# Patient Record
Sex: Male | Born: 1970 | Race: Black or African American | Hispanic: No | Smoking: Current every day smoker
Health system: Southern US, Community
[De-identification: ages and names within clinical notes are randomized; demographics above are authoritative.]

## PROBLEM LIST (undated history)

## (undated) DIAGNOSIS — T7840XA Allergy, unspecified, initial encounter: Secondary | ICD-10-CM

## (undated) DIAGNOSIS — K219 Gastro-esophageal reflux disease without esophagitis: Secondary | ICD-10-CM

## (undated) DIAGNOSIS — F32A Depression, unspecified: Secondary | ICD-10-CM

## (undated) DIAGNOSIS — G4730 Sleep apnea, unspecified: Secondary | ICD-10-CM

## (undated) DIAGNOSIS — F329 Major depressive disorder, single episode, unspecified: Secondary | ICD-10-CM

## (undated) DIAGNOSIS — E119 Type 2 diabetes mellitus without complications: Secondary | ICD-10-CM

## (undated) DIAGNOSIS — I10 Essential (primary) hypertension: Secondary | ICD-10-CM

## (undated) DIAGNOSIS — F419 Anxiety disorder, unspecified: Secondary | ICD-10-CM

## (undated) DIAGNOSIS — M1990 Unspecified osteoarthritis, unspecified site: Secondary | ICD-10-CM

## (undated) DIAGNOSIS — F4322 Adjustment disorder with anxiety: Secondary | ICD-10-CM

## (undated) DIAGNOSIS — F17200 Nicotine dependence, unspecified, uncomplicated: Secondary | ICD-10-CM

## (undated) DIAGNOSIS — E785 Hyperlipidemia, unspecified: Secondary | ICD-10-CM

## (undated) HISTORY — PX: CARPAL TUNNEL RELEASE: SHX101

## (undated) HISTORY — DX: Hyperlipidemia, unspecified: E78.5

## (undated) HISTORY — DX: Adjustment disorder with anxiety: F43.22

## (undated) HISTORY — DX: Nicotine dependence, unspecified, uncomplicated: F17.200

## (undated) HISTORY — DX: Allergy, unspecified, initial encounter: T78.40XA

## (undated) HISTORY — PX: WISDOM TOOTH EXTRACTION: SHX21

---

## 2011-02-13 HISTORY — PX: OTHER SURGICAL HISTORY: SHX169

## 2012-07-09 ENCOUNTER — Encounter (HOSPITAL_COMMUNITY): Payer: Self-pay | Admitting: *Deleted

## 2012-07-09 ENCOUNTER — Emergency Department (HOSPITAL_COMMUNITY)
Admission: EM | Admit: 2012-07-09 | Discharge: 2012-07-09 | Disposition: A | Discharge status: Home / Self Care | Payer: Self-pay | Attending: Emergency Medicine | Admitting: Emergency Medicine

## 2012-07-09 DIAGNOSIS — K5289 Other specified noninfective gastroenteritis and colitis: Secondary | ICD-10-CM | POA: Insufficient documentation

## 2012-07-09 DIAGNOSIS — Z8711 Personal history of peptic ulcer disease: Secondary | ICD-10-CM | POA: Insufficient documentation

## 2012-07-09 DIAGNOSIS — Z880 Allergy status to penicillin: Secondary | ICD-10-CM | POA: Insufficient documentation

## 2012-07-09 DIAGNOSIS — Z79899 Other long term (current) drug therapy: Secondary | ICD-10-CM | POA: Insufficient documentation

## 2012-07-09 DIAGNOSIS — R112 Nausea with vomiting, unspecified: Secondary | ICD-10-CM | POA: Insufficient documentation

## 2012-07-09 DIAGNOSIS — I10 Essential (primary) hypertension: Secondary | ICD-10-CM | POA: Insufficient documentation

## 2012-07-09 DIAGNOSIS — K529 Noninfective gastroenteritis and colitis, unspecified: Secondary | ICD-10-CM

## 2012-07-09 HISTORY — DX: Essential (primary) hypertension: I10

## 2012-07-09 LAB — CBC WITH DIFFERENTIAL/PLATELET
Basophils Absolute: 0.1 10*3/uL (ref 0.0–0.1)
Basophils Relative: 0 % (ref 0–1)
Eosinophils Absolute: 0 10*3/uL (ref 0.0–0.7)
Eosinophils Relative: 0 % (ref 0–5)
HCT: 45.1 % (ref 39.0–52.0)
Hemoglobin: 15 g/dL (ref 13.0–17.0)
Lymphocytes Relative: 12 % (ref 12–46)
Lymphs Abs: 1.5 10*3/uL (ref 0.7–4.0)
MCH: 27.4 pg (ref 26.0–34.0)
MCHC: 33.3 g/dL (ref 30.0–36.0)
MCV: 82.4 fL (ref 78.0–100.0)
Monocytes Absolute: 0.6 10*3/uL (ref 0.1–1.0)
Monocytes Relative: 5 % (ref 3–12)
Neutro Abs: 10.7 10*3/uL — ABNORMAL HIGH (ref 1.7–7.7)
Neutrophils Relative %: 83 % — ABNORMAL HIGH (ref 43–77)
Platelets: 288 10*3/uL (ref 150–400)
RBC: 5.47 MIL/uL (ref 4.22–5.81)
RDW: 14.5 % (ref 11.5–15.5)
WBC: 12.9 10*3/uL — ABNORMAL HIGH (ref 4.0–10.5)

## 2012-07-09 LAB — COMPREHENSIVE METABOLIC PANEL
ALT: 16 U/L (ref 0–53)
AST: 17 U/L (ref 0–37)
Albumin: 4.4 g/dL (ref 3.5–5.2)
Alkaline Phosphatase: 101 U/L (ref 39–117)
BUN: 9 mg/dL (ref 6–23)
CO2: 21 mEq/L (ref 19–32)
Calcium: 9.9 mg/dL (ref 8.4–10.5)
Chloride: 106 mEq/L (ref 96–112)
Creatinine, Ser: 1.11 mg/dL (ref 0.50–1.35)
GFR calc Af Amer: >90 mL/min (ref >90)
GFR calc non Af Amer: 81 mL/min — ABNORMAL LOW (ref >90)
Glucose, Bld: 127 mg/dL — ABNORMAL HIGH (ref 70–99)
Potassium: 3.9 mEq/L (ref 3.5–5.1)
Sodium: 143 mEq/L (ref 135–145)
Total Bilirubin: 0.4 mg/dL (ref 0.3–1.2)
Total Protein: 8.1 g/dL (ref 6.0–8.3)

## 2012-07-09 LAB — URINALYSIS, ROUTINE W REFLEX MICROSCOPIC
Glucose, UA: NEGATIVE mg/dL
Hgb urine dipstick: NEGATIVE
Ketones, ur: 40 mg/dL — AB
Leukocytes, UA: NEGATIVE
Nitrite: NEGATIVE
Protein, ur: NEGATIVE mg/dL
Specific Gravity, Urine: 1.035 — ABNORMAL HIGH (ref 1.005–1.030)
Urobilinogen, UA: 0.2 mg/dL (ref 0.0–1.0)
pH: 5.5 (ref 5.0–8.0)

## 2012-07-09 LAB — LIPASE, BLOOD: Lipase: 27 U/L (ref 11–59)

## 2012-07-09 MED ORDER — PROMETHAZINE HCL 25 MG PO TABS: 25.0000 mg | ORAL_TABLET | Freq: Four times a day (QID) | ORAL | Status: DC | PRN

## 2012-07-09 MED ORDER — ONDANSETRON HCL 4 MG/2ML IJ SOLN
4.0000 mg | Freq: Once | INTRAMUSCULAR | Status: AC
  Administered 2012-07-09: 4 mg via INTRAVENOUS
  Filled 2012-07-09: qty 2

## 2012-07-09 MED ORDER — HYDROMORPHONE HCL PF 1 MG/ML IJ SOLN
1.0000 mg | Freq: Once | INTRAMUSCULAR | Status: AC
  Administered 2012-07-09: 1 mg via INTRAVENOUS
  Filled 2012-07-09: qty 1

## 2012-07-09 MED ORDER — PROMETHAZINE HCL 25 MG/ML IJ SOLN
12.5000 mg | Freq: Once | INTRAMUSCULAR | Status: AC
  Administered 2012-07-09: 12.5 mg via INTRAVENOUS
  Filled 2012-07-09: qty 1

## 2012-07-09 MED ORDER — SODIUM CHLORIDE 0.9 % IV BOLUS (SEPSIS)
2000.0000 mL | Freq: Once | INTRAVENOUS | Status: AC
  Administered 2012-07-09: 2000 mL via INTRAVENOUS

## 2012-07-09 NOTE — ED Notes (Signed)
Pt resting now.  nadn.

## 2012-07-09 NOTE — ED Provider Notes (Signed)
History     CSN: [id number]  Arrival date & time 07/09/12  1751   First MD Initiated Contact with Patient 07/09/12 1815      Chief Complaint  Patient presents with  . Emesis  . Diarrhea    (Consider location/radiation/quality/duration/timing/severity/associated sxs/prior treatment) HPI Patient presents to the emergency department with nausea, vomiting, and diarrhea that began 2 days, ago.  Patient, states, that he's had this in the past and has seen GI at [facility] patient denies chest pain, shortness of breath, back pain, dysuria, fever, or weakness, syncope, or dizziness.  The patient, states, that he did not take anything prior to arrival for his symptoms.  Patient, states, that nothing seems to make his condition, better.  He states, that drinking fluids, makes him vomit more. Past Medical History  Diagnosis Date  . Hypertension   . Gastric ulcer     History reviewed. No pertinent past surgical history.  History reviewed. No pertinent family history.  History  Substance Use Topics  . Smoking status: Not on file  . Smokeless tobacco: Not on file  . Alcohol Use: No      Review of Systems All other systems negative except as documented in the HPI. All pertinent positives and negatives as reviewed in the HPI. Allergies  Penicillins  Home Medications   Current Outpatient Rx  Name  Route  Sig  Dispense  Refill  . omeprazole (PRILOSEC) 20 MG capsule   Oral   Take 20 mg by mouth daily.           BP 125/76  Pulse 58  Temp(Src) 98.8 F (37.1 C) (Oral)  Resp 16  SpO2 97%  Physical Exam  Nursing note and vitals reviewed. Constitutional: He is oriented to person, place, and time. He appears well-developed and well-nourished. No distress.  HENT:  Head: Normocephalic and atraumatic.  Mouth/Throat: Oropharynx is clear and moist.  Eyes: Pupils are equal, round, and reactive to light.  Neck: Normal range of motion. Neck supple.  Cardiovascular: Normal rate,  regular rhythm and normal heart sounds.  Exam reveals no gallop and no friction rub.   No murmur heard. Pulmonary/Chest: Effort normal and breath sounds normal. No respiratory distress.  Abdominal: Soft. Bowel sounds are normal. He exhibits no distension. There is no rebound and no guarding.  Neurological: He is alert and oriented to person, place, and time.  Skin: Skin is warm and dry.    ED Course  Procedures (including critical care time)  Labs Reviewed  CBC WITH DIFFERENTIAL - Abnormal; Notable for the following:    WBC 12.9 (*)    Neutrophils Relative % 83 (*)    Neutro Abs 10.7 (*)    All other components within normal limits  COMPREHENSIVE METABOLIC PANEL - Abnormal; Notable for the following:    Glucose, Bld 127 (*)    GFR calc non Af Amer 81 (*)    All other components within normal limits  URINALYSIS, ROUTINE W REFLEX MICROSCOPIC - Abnormal; Notable for the following:    Specific Gravity, Urine 1.035 (*)    Bilirubin Urine SMALL (*)    Ketones, ur 40 (*)    All other components within normal limits  LIPASE, BLOOD  patient states, that he needs to go home since he has to take the bus. The patient is feeling better after fluids and pain medication.  Patient is advised to followup with his GI doctor is told to return here for any worsening in his condition.  MDM         Carlyle Dolly, PA-C 07/09/12 2152

## 2012-07-09 NOTE — ED Notes (Signed)
Pt arrived by [facility] for diarrhea since yesterday morning and vomiting since this am. Hx of gi ulcer. Given zofran pta.

## 2012-07-09 NOTE — ED Notes (Signed)
Pt dc to home. Pt sts understanding to dc instructions. Pt ambulatory to exit without difficulty.  Pt denies need for w/c.  

## 2012-07-09 NOTE — ED Provider Notes (Signed)
Medical screening examination/treatment/procedure(s) were performed by non-physician practitioner and as supervising physician I was immediately available for consultation/collaboration.  Jerred Zaremba T Folasade Mooty, MD 07/09/12 2328 

## 2015-04-21 ENCOUNTER — Encounter: Payer: Self-pay | Admitting: Internal Medicine

## 2015-04-21 ENCOUNTER — Ambulatory Visit (INDEPENDENT_AMBULATORY_CARE_PROVIDER_SITE_OTHER): Payer: Self-pay | Admitting: Internal Medicine

## 2015-04-21 VITALS — BP 127/78 | HR 76 | Temp 98.6°F | Ht 66.9 in | Wt 206.4 lb

## 2015-04-21 DIAGNOSIS — R5381 Other malaise: Secondary | ICD-10-CM

## 2015-04-21 DIAGNOSIS — K219 Gastro-esophageal reflux disease without esophagitis: Secondary | ICD-10-CM

## 2015-04-21 DIAGNOSIS — F4322 Adjustment disorder with anxiety: Secondary | ICD-10-CM

## 2015-04-21 DIAGNOSIS — Z789 Other specified health status: Secondary | ICD-10-CM

## 2015-04-21 DIAGNOSIS — Z0000 Encounter for general adult medical examination without abnormal findings: Secondary | ICD-10-CM

## 2015-04-21 DIAGNOSIS — F4323 Adjustment disorder with mixed anxiety and depressed mood: Secondary | ICD-10-CM

## 2015-04-21 DIAGNOSIS — F17200 Nicotine dependence, unspecified, uncomplicated: Secondary | ICD-10-CM

## 2015-04-21 MED ORDER — NICOTINE 14 MG/24HR TD PT24: 14.0000 mg | MEDICATED_PATCH | TRANSDERMAL | Status: DC

## 2015-04-21 MED ORDER — NICOTINE POLACRILEX 4 MG MT GUM: 4.0000 mg | CHEWING_GUM | OROMUCOSAL | Status: DC | PRN

## 2015-04-21 NOTE — Assessment & Plan Note (Addendum)
He is smoking half pack daily for the last 30 years, and is ready to quit. I offered him the [place] quit line number, and asked him to call for free samples of nicotine patch and gum. At the next visit, once he gets insurance, we can offer varenicline. I would steer clear of bupropion as it can worsen his pre-existing anxiety.

## 2015-04-21 NOTE — Assessment & Plan Note (Signed)
Once he is large card, we can order a lipid panel and consider HIV screen, although he does not have any risk factors.

## 2015-04-21 NOTE — Assessment & Plan Note (Addendum)
He complains of persistently worrying about things, which I believe is most likely adjustment disorder with anxious mood given he has had about 10 death in his family over the last year, the most recent being his mother in December. He had tried Zoloft in the past, but this actually worsened his anxiety. At the next visit, once he gets insurance, we can offer a different SSRI.

## 2015-04-21 NOTE — Patient Instructions (Signed)
Timothy Mccarthy,  It was great to meet you today and I'm glad to hear you'll be one of our patients going forward.  Like we discussed, I highly doubt your chest pain was a heart attack, so you can rest assured.  Also, quitting smoking is HANDS DOWN the best thing you can do for your health. Give the [place]-Quitline a call and ask for some free samples for nicotine patch and gum.  Once you get the insurance figured out, we can get some lab work but nothing is pressing right now.  Take care and we'll see you in 1 month, Dr. Melburn Hake

## 2015-04-21 NOTE — Progress Notes (Signed)
Patient ID: Timothy Mccarthy, male   DOB: 1970/11/14, 45 y.o.   MRN: [id number] [facility] Subjective:   Patient ID: Timothy Mccarthy male   DOB: [birth year] 45 y.o.   MRN: [id number]  HPI: TimothyLevander SHEIKH Mccarthy is a 45 y.o. male with tobacco use disorder presenting to clinic to establish care and complains of an episode of epigastric pain, anxiety, and tobacco abuse.  Epigastric pain: Last week, he laid down to go to bed shortly after eating and had an episode of sharp epigastric pain. He denies any exertional component, nausea, or diaphoresis. He further denies any exertional chest pain and can comfortably walk up 10 flights of stairs without any pain.  Anxiety: Over the last year, 61 of his family members have died. He feels himself worrying constantly and has trouble sleeping. He had been on Zoloft in the past but it made him feel even more anxious. He'd like to talk to a counselor once he gets insurance.  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  Respiratory: Negative for shortness of breath.   Cardiovascular: Negative for chest pain, palpitations, orthopnea, claudication and leg swelling.  Gastrointestinal: Positive for heartburn and abdominal pain. Negative for diarrhea, constipation, blood in stool and melena.  Neurological: Negative for dizziness and loss of consciousness.  Psychiatric/Behavioral: Negative for depression and suicidal ideas. The patient is nervous/anxious.      Objective:  Physical Exam: Filed Vitals:   04/21/15 1512  BP: 127/78  Pulse: 76  Temp: 98.6 F (37 C)  TempSrc: Oral  Height: 5' 6.9" (1.699 m)  Weight: 206 lb 6.4 oz (93.622 kg)  SpO2: 100%   General: friendly young man resting in chair comfortably, appropriately conversational Cardiac: regular rate and rhythm, no rubs, murmurs or gallops Pulm: breathing well, clear to auscultation bilaterally Abd: bowel sounds normal, soft, nondistended, non-tender Ext: warm and well perfused, without pedal edema Lymph: no cervical or supraclavicular lymphadenopathy  Assessment & Plan:  Case discussed with Dr. Dareen Piano  GERD (gastroesophageal reflux disease) He has mild intermittent gastroesophageal reflux disease without alarm symptoms. I didn't feel strongly about prescribing pantoprazole today; instead I recommended he take Tums as needed and cut back on caffeine.  Tobacco use disorder He is smoking half pack daily for the last 30 years, and is ready to quit. I offered him the [place] quit line number, and asked him to call for free samples of nicotine patch and gum. At the next visit, once he gets insurance, we can offer varenicline. I would steer clear of bupropion as it can worsen his pre-existing anxiety.  Health care maintenance Once he is large card, we can order a lipid panel and consider HIV screen, although he does not have any risk factors.  Adjustment disorder with anxious mood He complains of persistently worrying about things, which I believe is most likely adjustment disorder with anxious mood given he has had about 10 death in his family over the last year, the most recent being his  mother in December. He had tried Zoloft in the past, but this actually worsened his anxiety. At the next visit, once he gets insurance, we can offer a different SSRI.   Medications Ordered Meds ordered this encounter  Medications  . nicotine (NICODERM CQ - DOSED IN MG/24 HOURS) 14 mg/24hr patch     Sig: Place 1 patch (14 mg total) onto the skin daily.    Dispense:  30 patch    Refill:  0  . nicotine polacrilex (NICORETTE) 4 MG gum    Sig: Take 1 each (4 mg total) by mouth as needed for smoking cessation.    Dispense:  100 tablet    Refill:  0   Follow Up: Return in about 4 weeks (around 05/19/2015).

## 2015-04-21 NOTE — Assessment & Plan Note (Signed)
He has mild intermittent gastroesophageal reflux disease without alarm symptoms. I didn't feel strongly about prescribing pantoprazole today; instead I recommended he take Tums as needed and cut back on caffeine.

## 2015-04-22 NOTE — Progress Notes (Signed)
Internal Medicine Clinic Attending  Case discussed with Dr. Flores at the time of the visit.  We reviewed the resident's history and exam and pertinent patient test results.  I agree with the assessment, diagnosis, and plan of care documented in the resident's note. 

## 2015-05-02 ENCOUNTER — Ambulatory Visit: Payer: Self-pay

## 2015-09-14 ENCOUNTER — Observation Stay (HOSPITAL_COMMUNITY)
Admission: EM | Admit: 2015-09-14 | Discharge: 2015-09-16 | Disposition: A | Discharge status: Home / Self Care | Payer: BLUE CROSS/BLUE SHIELD | Attending: General Surgery | Admitting: General Surgery

## 2015-09-14 ENCOUNTER — Encounter (HOSPITAL_COMMUNITY): Payer: Self-pay | Admitting: *Deleted

## 2015-09-14 ENCOUNTER — Encounter (HOSPITAL_COMMUNITY): Payer: Self-pay | Admitting: Emergency Medicine

## 2015-09-14 ENCOUNTER — Ambulatory Visit (INDEPENDENT_AMBULATORY_CARE_PROVIDER_SITE_OTHER)
Admission: EM | Admit: 2015-09-14 | Discharge: 2015-09-14 | Disposition: A | Discharge status: Nursing Home (Includes ICF) | Payer: BLUE CROSS/BLUE SHIELD | Source: Home / Self Care | Attending: Family Medicine | Admitting: Family Medicine

## 2015-09-14 DIAGNOSIS — Z880 Allergy status to penicillin: Secondary | ICD-10-CM | POA: Diagnosis not present

## 2015-09-14 DIAGNOSIS — K219 Gastro-esophageal reflux disease without esophagitis: Secondary | ICD-10-CM | POA: Insufficient documentation

## 2015-09-14 DIAGNOSIS — K3580 Unspecified acute appendicitis: Secondary | ICD-10-CM | POA: Diagnosis present

## 2015-09-14 DIAGNOSIS — R1011 Right upper quadrant pain: Secondary | ICD-10-CM

## 2015-09-14 DIAGNOSIS — I10 Essential (primary) hypertension: Secondary | ICD-10-CM | POA: Diagnosis not present

## 2015-09-14 DIAGNOSIS — K37 Unspecified appendicitis: Secondary | ICD-10-CM | POA: Diagnosis present

## 2015-09-14 DIAGNOSIS — F17210 Nicotine dependence, cigarettes, uncomplicated: Secondary | ICD-10-CM | POA: Insufficient documentation

## 2015-09-14 DIAGNOSIS — K353 Acute appendicitis with localized peritonitis, without perforation or gangrene: Secondary | ICD-10-CM

## 2015-09-14 DIAGNOSIS — K3589 Other acute appendicitis without perforation or gangrene: Secondary | ICD-10-CM

## 2015-09-14 LAB — COMPREHENSIVE METABOLIC PANEL
ALT: 35 U/L (ref 17–63)
AST: 27 U/L (ref 15–41)
Albumin: 4.1 g/dL (ref 3.5–5.0)
Alkaline Phosphatase: 102 U/L (ref 38–126)
Anion gap: 9 (ref 5–15)
BUN: 10 mg/dL (ref 6–20)
CO2: 28 mmol/L (ref 22–32)
Calcium: 9.5 mg/dL (ref 8.9–10.3)
Chloride: 101 mmol/L (ref 101–111)
Creatinine, Ser: 1.26 mg/dL — ABNORMAL HIGH (ref 0.61–1.24)
GFR calc Af Amer: >60 mL/min (ref >60)
GFR calc non Af Amer: >60 mL/min (ref >60)
Glucose, Bld: 104 mg/dL — ABNORMAL HIGH (ref 65–99)
Potassium: 3.2 mmol/L — ABNORMAL LOW (ref 3.5–5.1)
Sodium: 138 mmol/L (ref 135–145)
Total Bilirubin: 0.6 mg/dL (ref 0.3–1.2)
Total Protein: 7.4 g/dL (ref 6.5–8.1)

## 2015-09-14 LAB — CBC
HCT: 44.5 % (ref 39.0–52.0)
Hemoglobin: 14.5 g/dL (ref 13.0–17.0)
MCH: 27.8 pg (ref 26.0–34.0)
MCHC: 32.6 g/dL (ref 30.0–36.0)
MCV: 85.2 fL (ref 78.0–100.0)
Platelets: 264 10*3/uL (ref 150–400)
RBC: 5.22 MIL/uL (ref 4.22–5.81)
RDW: 14.1 % (ref 11.5–15.5)
WBC: 14.2 10*3/uL — ABNORMAL HIGH (ref 4.0–10.5)

## 2015-09-14 LAB — URINALYSIS, ROUTINE W REFLEX MICROSCOPIC
Bilirubin Urine: NEGATIVE
Glucose, UA: NEGATIVE mg/dL
Hgb urine dipstick: NEGATIVE
Ketones, ur: NEGATIVE mg/dL
Nitrite: NEGATIVE
Protein, ur: NEGATIVE mg/dL
Specific Gravity, Urine: 1.031 — ABNORMAL HIGH (ref 1.005–1.030)
pH: 7 (ref 5.0–8.0)

## 2015-09-14 LAB — URINE MICROSCOPIC-ADD ON: RBC / HPF: NONE SEEN RBC/hpf (ref 0–5)

## 2015-09-14 LAB — LIPASE, BLOOD: Lipase: 19 U/L (ref 11–51)

## 2015-09-14 MED ORDER — MORPHINE SULFATE (PF) 4 MG/ML IV SOLN
4.0000 mg | Freq: Once | INTRAVENOUS | Status: AC
  Administered 2015-09-15: 4 mg via INTRAVENOUS
  Filled 2015-09-14: qty 1

## 2015-09-14 MED ORDER — ONDANSETRON HCL 4 MG/2ML IJ SOLN
4.0000 mg | Freq: Once | INTRAMUSCULAR | Status: AC
  Administered 2015-09-15: 4 mg via INTRAVENOUS
  Filled 2015-09-14: qty 2

## 2015-09-14 MED ORDER — SODIUM CHLORIDE 0.9 % IV BOLUS (SEPSIS)
1000.0000 mL | Freq: Once | INTRAVENOUS | Status: AC
  Administered 2015-09-15: 1000 mL via INTRAVENOUS

## 2015-09-14 NOTE — ED Notes (Signed)
Cassie, RN in nurse first given report

## 2015-09-14 NOTE — ED Triage Notes (Signed)
Pt c/o abdominal pain for the past couple of days. Pt vomiting once today after eating lunch, last BM was last night. Pt denies any urinary symptoms.

## 2015-09-14 NOTE — ED Notes (Signed)
PT chooses to go to ED via private vehicle

## 2015-09-14 NOTE — ED Provider Notes (Signed)
[place] DEPT Provider Note   CSN: [id number] Arrival date & time: 09/14/15  2013  First Provider Contact:  None       History   Chief Complaint Chief Complaint  Patient presents with  . Abdominal Pain    HPI Timothy Mccarthy is a 45 y.o. male.  45 yo M with a chief complaint of epigastric abdominal pain. Going on for the past couple days. Usually worse with belching and passing flatus. Has been constipated for the same number of days. Denies prior abdominal surgery. Denies fevers or chills. Patient has had difficulty eating for the past day. Just doesn't feel like it did when he eats he has worsening of his pain. He feels like the symptoms are somewhat similar when he had reflux in the past.   The history is provided by the patient.  Abdominal Pain   This is a new problem. The current episode started 2 days ago. The problem occurs constantly. The problem has been gradually worsening. The pain is associated with eating. The pain is located in the epigastric region. The quality of the pain is shooting, sharp and burning. The pain is at a severity of 7/10. The pain is moderate. Associated symptoms include nausea and constipation. Pertinent negatives include fever, diarrhea, vomiting, headaches, arthralgias and myalgias. Nothing aggravates the symptoms. The symptoms are relieved by belching, sitting up and flatus.    Past Medical History:  Diagnosis Date  . Adjustment disorder with anxious mood   . Hypertension   . Tobacco use disorder     Patient Active Problem List   Diagnosis Date Noted  . Appendicitis 09/15/2015  . Tobacco use disorder 04/21/2015  . Adjustment disorder with anxious mood 04/21/2015  . Health care maintenance 04/21/2015  . GERD (gastroesophageal reflux disease) 04/21/2015    Past Surgical History:  Procedure Laterality Date  . LUMPS ON BACK  2013  . WISDOM TOOTH EXTRACTION         Home Medications    Prior to Admission medications     Medication Sig Start Date End Date Taking? Authorizing Provider  hydrochlorothiazide (HYDRODIURIL) 25 MG tablet Take 25 mg by mouth daily.   Yes Historical Provider, MD  mirtazapine (REMERON) 15 MG tablet Take 15 mg by mouth at bedtime.   Yes Historical Provider, MD  nicotine (NICODERM CQ - DOSED IN MG/24 HOURS) 14 mg/24hr patch Place 1 patch (14 mg total) onto the skin daily. Patient not taking: Reported on 09/14/2015 04/21/15   Loleta Chance, MD  nicotine polacrilex (NICORETTE) 4 MG gum Take 1 each (4 mg total) by mouth as needed for smoking cessation. Patient not taking: Reported on 09/14/2015 04/21/15   Loleta Chance, MD    Family History Family History  Problem Relation Age of Onset  . Stroke Mother   . Diabetes type II Mother     Social History Social History  Substance Use Topics  . Smoking status: Current Every Day Smoker    Packs/day: 0.50    Years: 10.00    Types: Cigarettes    Start date: 04/21/1987  . Smokeless tobacco: Never Used  . Alcohol use 0.0 oz/week     Comment: sometimes     Allergies   Penicillins   Review of Systems Review of Systems  Constitutional: Negative for chills and fever.  HENT: Negative for congestion and facial swelling.   Eyes: Negative for discharge and visual disturbance.  Respiratory: Negative for shortness of breath.   Cardiovascular: Negative for chest pain  and palpitations.  Gastrointestinal: Positive for abdominal pain, constipation and nausea. Negative for diarrhea and vomiting.  Musculoskeletal: Negative for arthralgias and myalgias.  Skin: Negative for color change and rash.  Neurological: Negative for tremors, syncope and headaches.  Psychiatric/Behavioral: Negative for confusion and dysphoric mood.     Physical Exam Updated Vital Signs BP 113/79 (BP Location: Left Arm)   Pulse 84   Temp 98.2 F (36.8 C) (Oral)   Resp 19   Ht 5\' 7"  (1.702 m)   Wt 240 lb 1.3 oz (108.9 kg)   SpO2 96%   BMI 37.60 kg/m   Physical Exam   Constitutional: He is oriented to person, place, and time. He appears well-developed and well-nourished.  HENT:  Head: Normocephalic and atraumatic.  Eyes: Conjunctivae and EOM are normal. Pupils are equal, round, and reactive to light.  Neck: Normal range of motion. No JVD present.  Cardiovascular: Normal rate and regular rhythm.   Pulmonary/Chest: Effort normal. No stridor. No respiratory distress.  Abdominal: He exhibits no distension. There is tenderness. There is rebound and guarding.  Tenderness worse at McBurney's point. Positive Ross takes positive psoas negative obturator  Musculoskeletal: Normal range of motion. He exhibits no edema.  Neurological: He is alert and oriented to person, place, and time.  Skin: Skin is warm and dry.  Psychiatric: He has a normal mood and affect. His behavior is normal.     ED Treatments / Results  Labs (all labs ordered are listed, but only abnormal results are displayed) Labs Reviewed  SURGICAL PCR SCREEN - Abnormal; Notable for the following:       Result Value   Staphylococcus aureus POSITIVE (*)    All other components within normal limits  COMPREHENSIVE METABOLIC PANEL - Abnormal; Notable for the following:    Potassium 3.2 (*)    Glucose, Bld 104 (*)    Creatinine, Ser 1.26 (*)    All other components within normal limits  CBC - Abnormal; Notable for the following:    WBC 14.2 (*)    All other components within normal limits  URINALYSIS, ROUTINE W REFLEX MICROSCOPIC (NOT AT [facility]) - Abnormal; Notable for the following:    Specific Gravity, Urine 1.031 (*)    Leukocytes, UA SMALL (*)    All other components within normal limits  URINE MICROSCOPIC-ADD ON - Abnormal; Notable for the following:    Squamous Epithelial / LPF 0-5 (*)    Bacteria, UA RARE (*)    All other components within normal limits  LIPASE, BLOOD    EKG  EKG Interpretation None       Radiology Ct Abdomen Pelvis W Contrast  Result Date: 09/15/2015 CLINICAL  DATA:  45 year old male with right lower quadrant abdominal pain. Concern for appendicitis. EXAM: CT ABDOMEN AND PELVIS WITH CONTRAST TECHNIQUE: Multidetector CT imaging of the abdomen and pelvis was performed using the standard protocol following bolus administration of intravenous contrast. CONTRAST:  152mL ISOVUE-300 IOPAMIDOL (ISOVUE-300) INJECTION 61% COMPARISON:  None. FINDINGS: The visualized lung bases are clear. No intra-abdominal free air or free fluid. The liver, gallbladder, pancreas, spleen, adrenal glands, kidneys, visualized ureters, and urinary bladder appear unremarkable. The prostate and seminal vesicles are grossly unremarkable. High attenuating content within the stomach likely related to ingested material and less likely blood products. There is moderate stool throughout the colon. Small scattered colonic diverticula without active inflammatory changes. No evidence of bowel obstruction. The appendix is enlarged and inflamed. With multiple large stones within the appendix. An  obstructing stone is noted at the base of the appendix. The appendix is located in the right lower quadrant inferior to the cecum. There is no evidence of perforation or abscess. There is minimal aortoiliac atherosclerotic disease. No portal venous gas identified. There is no adenopathy. Small fat containing umbilical hernia. The abdominal wall soft tissues are otherwise unremarkable. Degenerative changes of the lumbar spine. The osseous structures are intact. IMPRESSION: Acute appendicitis.  No abscess. Electronically Signed   By: Anner Crete M.D.   On: 09/15/2015 01:48    Procedures Procedures (including critical care time)  Medications Ordered in ED Medications  enoxaparin (LOVENOX) injection 40 mg (not administered)  ciprofloxacin (CIPRO) IVPB 400 mg (400 mg Intravenous Given 09/15/15 1023)    And  metroNIDAZOLE (FLAGYL) IVPB 500 mg (500 mg Intravenous Given 09/15/15 1023)  acetaminophen (TYLENOL) tablet 650  mg (not administered)    Or  acetaminophen (TYLENOL) suppository 650 mg (not administered)  morphine 2 MG/ML injection 2 mg (not administered)  methocarbamol (ROBAXIN) tablet 500 mg (not administered)  ondansetron (ZOFRAN-ODT) disintegrating tablet 4 mg (not administered)    Or  ondansetron (ZOFRAN) injection 4 mg (not administered)  nicotine (NICODERM CQ - dosed in mg/24 hours) patch 14 mg (14 mg Transdermal Patch Applied 09/15/15 1029)  morphine 4 MG/ML injection 4 mg (4 mg Intravenous Given 09/15/15 0009)  ondansetron (ZOFRAN) injection 4 mg (4 mg Intravenous Given 09/15/15 0007)  sodium chloride 0.9 % bolus 1,000 mL (0 mLs Intravenous Stopped 09/15/15 0227)  iopamidol (ISOVUE-300) 61 % injection (100 mLs  Contrast Given 09/15/15 0100)  ciprofloxacin (CIPRO) IVPB 400 mg (0 mg Intravenous Stopped 09/15/15 0333)    And  metroNIDAZOLE (FLAGYL) IVPB 500 mg (0 mg Intravenous Stopped 09/15/15 0333)  morphine 4 MG/ML injection 4 mg (4 mg Intravenous Given 09/15/15 0224)  0.9 %  sodium chloride infusion ( Intravenous New Bag/Given 09/15/15 0228)     Initial Impression / Assessment and Plan / ED Course  I have reviewed the triage vital signs and the nursing notes.  Pertinent labs & imaging results that were available during my care of the patient were reviewed by me and considered in my medical decision making (see chart for details).  Clinical Course    45 yo M With a chief complaint of abdominal pain. My abdominal exam is concerning for appendicitis as the patient has severe right lower quadrant tenderness. Will obtain a CT scan of the abdomen and pelvis contrast. Turned over to Dr. Stark Jock, awaiting CT  The patients results and plan were reviewed and discussed.   Any x-rays performed were independently reviewed by myself.   Differential diagnosis were considered with the presenting HPI.  Medications  enoxaparin (LOVENOX) injection 40 mg (not administered)  ciprofloxacin (CIPRO) IVPB 400 mg (400 mg  Intravenous Given 09/15/15 1023)    And  metroNIDAZOLE (FLAGYL) IVPB 500 mg (500 mg Intravenous Given 09/15/15 1023)  acetaminophen (TYLENOL) tablet 650 mg (not administered)    Or  acetaminophen (TYLENOL) suppository 650 mg (not administered)  morphine 2 MG/ML injection 2 mg (not administered)  methocarbamol (ROBAXIN) tablet 500 mg (not administered)  ondansetron (ZOFRAN-ODT) disintegrating tablet 4 mg (not administered)    Or  ondansetron (ZOFRAN) injection 4 mg (not administered)  nicotine (NICODERM CQ - dosed in mg/24 hours) patch 14 mg (14 mg Transdermal Patch Applied 09/15/15 1029)  morphine 4 MG/ML injection 4 mg (4 mg Intravenous Given 09/15/15 0009)  ondansetron (ZOFRAN) injection 4 mg (4 mg Intravenous Given 09/15/15  0007)  sodium chloride 0.9 % bolus 1,000 mL (0 mLs Intravenous Stopped 09/15/15 0227)  iopamidol (ISOVUE-300) 61 % injection (100 mLs  Contrast Given 09/15/15 0100)  ciprofloxacin (CIPRO) IVPB 400 mg (0 mg Intravenous Stopped 09/15/15 0333)    And  metroNIDAZOLE (FLAGYL) IVPB 500 mg (0 mg Intravenous Stopped 09/15/15 0333)  morphine 4 MG/ML injection 4 mg (4 mg Intravenous Given 09/15/15 0224)  0.9 %  sodium chloride infusion ( Intravenous New Bag/Given 09/15/15 0228)    Vitals:   09/15/15 0444 09/15/15 0517 09/15/15 0952 09/15/15 1349  BP: 142/92 118/77 105/73 113/79  Pulse: 67 62 65 84  Resp: 19 20 19 19   Temp: 97.7 F (36.5 C) 98 F (36.7 C) 97.6 F (36.4 C) 98.2 F (36.8 C)  TempSrc: Oral Oral Oral Oral  SpO2: 95% 95% 98% 96%  Weight:  240 lb 1.3 oz (108.9 kg)    Height:  5\' 7"  (1.702 m)      Final diagnoses:  Acute appendicitis with localized peritonitis       Final Clinical Impressions(s) / ED Diagnoses   Final diagnoses:  Acute appendicitis with localized peritonitis    New Prescriptions Current Discharge Medication List       Deno Etienne, DO 09/15/15 1442

## 2015-09-14 NOTE — ED Triage Notes (Signed)
PT reports epigastric pain that started Monday. Pt reports his eating habits have been bad lately. PT reports pain is worse after eating and lying down. PT reports when he burps, it burns. PT used to be on prilosec for GERD

## 2015-09-14 NOTE — ED Provider Notes (Signed)
Angiocath insertion Date/Time: 09/14/2015 11:50 PM Performed by: Abigail Butts Authorized by: Abigail Butts  Consent: Verbal consent obtained. Risks and benefits: risks, benefits and alternatives were discussed Consent given by: patient Patient understanding: patient states understanding of the procedure being performed Patient consent: the patient's understanding of the procedure matches consent given Procedure consent: procedure consent matches procedure scheduled Site marked: the operative site was marked Required items: required blood products, implants, devices, and special equipment available Patient identity confirmed: verbally with patient Time out: Immediately prior to procedure a "time out" was called to verify the correct patient, procedure, equipment, support staff and site/side marked as required. Preparation: Patient was prepped and draped in the usual sterile fashion. Local anesthesia used: no  Anesthesia: Local anesthesia used: no  Sedation: Patient sedated: no Patient tolerance: Patient tolerated the procedure well with no immediate complications Comments: [id number] Right AC      Aren Cherne, PA-C 09/14/15 [place], DO 09/14/15 2352

## 2015-09-14 NOTE — ED Provider Notes (Signed)
CSN: [id number]     Arrival date & time 09/14/15  1826 History   First MD Initiated Contact with Patient 09/14/15 1936     Chief Complaint  Patient presents with  . Gastroesophageal Reflux   (Consider location/radiation/quality/duration/timing/severity/associated sxs/prior Treatment) HPI 45 Y/O MALE WITH 2 DAY HX OF ABDOMINAL PAIN AND BLOATING. VOMITING WITH EATING. PAIN SCORE 5. USING OTC MEDS WITHOUT RELIEF. HX OF GERD, BUT NOT ON MEDS. DRANK ETOH Monday, A BIT MORE THAN USUAL.  Past Medical History:  Diagnosis Date  . Adjustment disorder with anxious mood   . Hypertension   . Tobacco use disorder    History reviewed. No pertinent surgical history. Family History  Problem Relation Age of Onset  . Stroke Mother   . Diabetes type II Mother    Social History  Substance Use Topics  . Smoking status: Current Every Day Smoker    Packs/day: 0.50    Types: Cigarettes    Start date: 04/21/1987  . Smokeless tobacco: Never Used  . Alcohol use 0.0 oz/week     Comment: sometimes    Review of Systems  Denies: HEADACHE,   CHEST PAIN, CONGESTION, DYSURIA, SHORTNESS OF BREATH  Allergies  Penicillins  Home Medications   Prior to Admission medications   Medication Sig Start Date End Date Taking? Authorizing Provider  nicotine (NICODERM CQ - DOSED IN MG/24 HOURS) 14 mg/24hr patch Place 1 patch (14 mg total) onto the skin daily. 04/21/15   Loleta Chance, MD  nicotine polacrilex (NICORETTE) 4 MG gum Take 1 each (4 mg total) by mouth as needed for smoking cessation. 04/21/15   Loleta Chance, MD   Meds Ordered and Administered this Visit  Medications - No data to display  BP (!) 150/102 (BP Location: Left Arm)   Pulse 84   Temp 98.7 F (37.1 C) (Oral)   Resp 16   SpO2 97%  No data found.   Physical Exam NURSES NOTES AND VITAL SIGNS REVIEWED. CONSTITUTIONAL: Well developed, well nourished, no acute distress HEENT: normocephalic, atraumatic EYES: Conjunctiva normal NECK:normal ROM,  supple, no adenopathy PULMONARY:No respiratory distress, normal effort ABDOMINAL: Soft,DISTENDED, +'VE MURPHY BS DECREASED, No CVAT MUSCULOSKELETAL: Normal ROM of all extremities,  SKIN: warm and dry without rash PSYCHIATRIC: Mood and affect, behavior are normal  Urgent Care Course   Clinical Course    Procedures (including critical care time)  Labs Review Labs Reviewed - No data to display  Imaging Review No results found.   Visual Acuity Review  Right Eye Distance:   Left Eye Distance:   Bilateral Distance:    Right Eye Near:   Left Eye Near:    Bilateral Near:         MDM   1. RUQ pain    PT HAS POSITIVE MURPHY, NO FEVER, NOT SEPTIC WITH STABLE VITALS.  SUGGEST ER FOR FURTHER EVALUATION, POSSIBLE GALLBLADDER, GASTRITIS, PANCREATITIS.     Konrad Felix, PA 09/14/15 1956

## 2015-09-15 ENCOUNTER — Encounter (HOSPITAL_COMMUNITY)
Admission: EM | Disposition: A | Discharge status: Home / Self Care | Payer: Self-pay | Source: Home / Self Care | Attending: Emergency Medicine

## 2015-09-15 ENCOUNTER — Inpatient Hospital Stay (HOSPITAL_COMMUNITY): Payer: BLUE CROSS/BLUE SHIELD | Admitting: Certified Registered Nurse Anesthetist

## 2015-09-15 ENCOUNTER — Encounter (HOSPITAL_COMMUNITY): Payer: Self-pay | Admitting: Radiology

## 2015-09-15 ENCOUNTER — Emergency Department (HOSPITAL_COMMUNITY): Payer: BLUE CROSS/BLUE SHIELD

## 2015-09-15 DIAGNOSIS — K37 Unspecified appendicitis: Secondary | ICD-10-CM | POA: Diagnosis present

## 2015-09-15 DIAGNOSIS — I10 Essential (primary) hypertension: Secondary | ICD-10-CM | POA: Diagnosis not present

## 2015-09-15 DIAGNOSIS — K3580 Unspecified acute appendicitis: Secondary | ICD-10-CM | POA: Diagnosis not present

## 2015-09-15 DIAGNOSIS — Z880 Allergy status to penicillin: Secondary | ICD-10-CM | POA: Diagnosis not present

## 2015-09-15 DIAGNOSIS — F17210 Nicotine dependence, cigarettes, uncomplicated: Secondary | ICD-10-CM | POA: Diagnosis not present

## 2015-09-15 HISTORY — PX: APPENDECTOMY: SHX54

## 2015-09-15 HISTORY — PX: LAPAROSCOPIC APPENDECTOMY: SHX408

## 2015-09-15 LAB — SURGICAL PCR SCREEN
MRSA, PCR: NEGATIVE
Staphylococcus aureus: POSITIVE — AB

## 2015-09-15 SURGERY — APPENDECTOMY, LAPAROSCOPIC
Anesthesia: General | Site: Abdomen

## 2015-09-15 MED ORDER — OXYCODONE HCL 5 MG PO TABS: 5.0000 mg | ORAL_TABLET | Freq: Once | ORAL | Status: DC | PRN

## 2015-09-15 MED ORDER — ONDANSETRON HCL 4 MG/2ML IJ SOLN: 4.0000 mg | Freq: Four times a day (QID) | INTRAMUSCULAR | Status: DC | PRN

## 2015-09-15 MED ORDER — ONDANSETRON HCL 4 MG/2ML IJ SOLN
INTRAMUSCULAR | Status: DC | PRN
  Administered 2015-09-15: 4 mg via INTRAVENOUS

## 2015-09-15 MED ORDER — FENTANYL CITRATE (PF) 250 MCG/5ML IJ SOLN
INTRAMUSCULAR | Status: AC
  Filled 2015-09-15: qty 5

## 2015-09-15 MED ORDER — OXYCODONE HCL 5 MG/5ML PO SOLN: 5.0000 mg | Freq: Once | ORAL | Status: DC | PRN

## 2015-09-15 MED ORDER — DEXAMETHASONE SODIUM PHOSPHATE 10 MG/ML IJ SOLN
INTRAMUSCULAR | Status: DC | PRN
  Administered 2015-09-15: 5 mg via INTRAVENOUS

## 2015-09-15 MED ORDER — BUPIVACAINE-EPINEPHRINE (PF) 0.25% -1:200000 IJ SOLN
INTRAMUSCULAR | Status: AC
  Filled 2015-09-15: qty 30

## 2015-09-15 MED ORDER — DEXAMETHASONE SODIUM PHOSPHATE 10 MG/ML IJ SOLN
INTRAMUSCULAR | Status: AC
  Filled 2015-09-15: qty 1

## 2015-09-15 MED ORDER — SUGAMMADEX SODIUM 200 MG/2ML IV SOLN
INTRAVENOUS | Status: DC | PRN
  Administered 2015-09-15: 200 mg via INTRAVENOUS

## 2015-09-15 MED ORDER — HYDROMORPHONE HCL 1 MG/ML IJ SOLN
0.2500 mg | INTRAMUSCULAR | Status: DC | PRN
  Administered 2015-09-15 (×4): 0.5 mg via INTRAVENOUS

## 2015-09-15 MED ORDER — LIDOCAINE 2% (20 MG/ML) 5 ML SYRINGE
INTRAMUSCULAR | Status: DC | PRN
  Administered 2015-09-15: 100 mg via INTRAVENOUS

## 2015-09-15 MED ORDER — MORPHINE SULFATE (PF) 4 MG/ML IV SOLN
INTRAVENOUS | Status: AC
  Filled 2015-09-15: qty 1

## 2015-09-15 MED ORDER — CIPROFLOXACIN IN D5W 400 MG/200ML IV SOLN
400.0000 mg | Freq: Two times a day (BID) | INTRAVENOUS | Status: DC
  Administered 2015-09-15: 400 mg via INTRAVENOUS
  Filled 2015-09-15 (×2): qty 200

## 2015-09-15 MED ORDER — HYDROMORPHONE HCL 1 MG/ML IJ SOLN
INTRAMUSCULAR | Status: AC
  Administered 2015-09-15: 0.5 mg via INTRAVENOUS
  Filled 2015-09-15: qty 1

## 2015-09-15 MED ORDER — PROMETHAZINE HCL 25 MG/ML IJ SOLN
6.2500 mg | INTRAMUSCULAR | Status: DC | PRN
Start: 2015-09-15 — End: 2015-09-15

## 2015-09-15 MED ORDER — MORPHINE SULFATE (PF) 4 MG/ML IV SOLN
4.0000 mg | Freq: Once | INTRAVENOUS | Status: AC
  Administered 2015-09-15: 4 mg via INTRAVENOUS

## 2015-09-15 MED ORDER — NICOTINE 14 MG/24HR TD PT24
14.0000 mg | MEDICATED_PATCH | Freq: Every day | TRANSDERMAL | Status: DC
  Administered 2015-09-15 – 2015-09-16 (×2): 14 mg via TRANSDERMAL
  Filled 2015-09-15 (×2): qty 1

## 2015-09-15 MED ORDER — ENOXAPARIN SODIUM 40 MG/0.4ML ~~LOC~~ SOLN
40.0000 mg | SUBCUTANEOUS | Status: DC
  Administered 2015-09-16: 40 mg via SUBCUTANEOUS
  Filled 2015-09-15: qty 0.4

## 2015-09-15 MED ORDER — PROPOFOL 10 MG/ML IV BOLUS
INTRAVENOUS | Status: DC | PRN
  Administered 2015-09-15: 200 mg via INTRAVENOUS

## 2015-09-15 MED ORDER — ONDANSETRON 4 MG PO TBDP: 4.0000 mg | ORAL_TABLET | Freq: Four times a day (QID) | ORAL | Status: DC | PRN

## 2015-09-15 MED ORDER — ACETAMINOPHEN 325 MG PO TABS: 650.0000 mg | ORAL_TABLET | Freq: Four times a day (QID) | ORAL | Status: DC | PRN

## 2015-09-15 MED ORDER — METRONIDAZOLE IN NACL 5-0.79 MG/ML-% IV SOLN
500.0000 mg | Freq: Three times a day (TID) | INTRAVENOUS | Status: DC
  Administered 2015-09-15: 500 mg via INTRAVENOUS
  Filled 2015-09-15 (×3): qty 100

## 2015-09-15 MED ORDER — IOPAMIDOL (ISOVUE-300) INJECTION 61%
INTRAVENOUS | Status: AC
  Administered 2015-09-15: 100 mL
  Filled 2015-09-15: qty 100

## 2015-09-15 MED ORDER — ROCURONIUM BROMIDE 10 MG/ML (PF) SYRINGE
PREFILLED_SYRINGE | INTRAVENOUS | Status: DC | PRN
  Administered 2015-09-15: 50 mg via INTRAVENOUS

## 2015-09-15 MED ORDER — SUGAMMADEX SODIUM 200 MG/2ML IV SOLN
INTRAVENOUS | Status: AC
  Filled 2015-09-15: qty 2

## 2015-09-15 MED ORDER — ACETAMINOPHEN 650 MG RE SUPP: 650.0000 mg | Freq: Four times a day (QID) | RECTAL | Status: DC | PRN

## 2015-09-15 MED ORDER — BUPIVACAINE-EPINEPHRINE 0.25% -1:200000 IJ SOLN
INTRAMUSCULAR | Status: DC | PRN
  Administered 2015-09-15: 10 mL

## 2015-09-15 MED ORDER — SODIUM CHLORIDE 0.9 % IV SOLN: Freq: Once | INTRAVENOUS | Status: DC

## 2015-09-15 MED ORDER — MORPHINE SULFATE (PF) 2 MG/ML IV SOLN: 2.0000 mg | INTRAVENOUS | Status: DC | PRN

## 2015-09-15 MED ORDER — SODIUM CHLORIDE 0.9 % IV SOLN
Freq: Once | INTRAVENOUS | Status: AC
  Administered 2015-09-15: 02:00:00 via INTRAVENOUS

## 2015-09-15 MED ORDER — OXYCODONE HCL 5 MG PO TABS
10.0000 mg | ORAL_TABLET | ORAL | Status: DC | PRN
  Administered 2015-09-15: 15 mg via ORAL
  Administered 2015-09-16 (×2): 10 mg via ORAL
  Filled 2015-09-15: qty 2
  Filled 2015-09-15: qty 3
  Filled 2015-09-15: qty 2

## 2015-09-15 MED ORDER — MIDAZOLAM HCL 5 MG/5ML IJ SOLN
INTRAMUSCULAR | Status: DC | PRN
  Administered 2015-09-15: 1 mg via INTRAVENOUS

## 2015-09-15 MED ORDER — MUPIROCIN 2 % EX OINT
TOPICAL_OINTMENT | CUTANEOUS | Status: AC
  Filled 2015-09-15: qty 22

## 2015-09-15 MED ORDER — CIPROFLOXACIN IN D5W 400 MG/200ML IV SOLN
400.0000 mg | Freq: Once | INTRAVENOUS | Status: AC
Start: 2015-09-15 — End: 2015-09-15
  Administered 2015-09-15: 400 mg via INTRAVENOUS
  Filled 2015-09-15: qty 200

## 2015-09-15 MED ORDER — LACTATED RINGERS IV SOLN
INTRAVENOUS | Status: DC
  Administered 2015-09-15 (×2): via INTRAVENOUS

## 2015-09-15 MED ORDER — MIRTAZAPINE 15 MG PO TABS
15.0000 mg | ORAL_TABLET | Freq: Every day | ORAL | Status: DC
  Administered 2015-09-15: 15 mg via ORAL
  Filled 2015-09-15: qty 1

## 2015-09-15 MED ORDER — MUPIROCIN 2 % EX OINT
TOPICAL_OINTMENT | Freq: Two times a day (BID) | CUTANEOUS | Status: DC
Start: 2015-09-15 — End: 2015-09-16
  Administered 2015-09-15 – 2015-09-16 (×3): via NASAL

## 2015-09-15 MED ORDER — METRONIDAZOLE IN NACL 5-0.79 MG/ML-% IV SOLN
500.0000 mg | Freq: Once | INTRAVENOUS | Status: AC
  Administered 2015-09-15: 500 mg via INTRAVENOUS
  Filled 2015-09-15: qty 100

## 2015-09-15 MED ORDER — METHOCARBAMOL 500 MG PO TABS
500.0000 mg | ORAL_TABLET | Freq: Four times a day (QID) | ORAL | Status: DC | PRN
  Administered 2015-09-15: 500 mg via ORAL
  Filled 2015-09-15: qty 1

## 2015-09-15 MED ORDER — 0.9 % SODIUM CHLORIDE (POUR BTL) OPTIME
TOPICAL | Status: DC | PRN
  Administered 2015-09-15: 100 mL

## 2015-09-15 MED ORDER — HYDROCHLOROTHIAZIDE 25 MG PO TABS
25.0000 mg | ORAL_TABLET | Freq: Every day | ORAL | Status: DC
  Administered 2015-09-15 – 2015-09-16 (×2): 25 mg via ORAL
  Filled 2015-09-15 (×2): qty 1

## 2015-09-15 MED ORDER — SODIUM CHLORIDE 0.9 % IR SOLN
Status: DC | PRN
  Administered 2015-09-15: 500 mL

## 2015-09-15 MED ORDER — FENTANYL CITRATE (PF) 100 MCG/2ML IJ SOLN
INTRAMUSCULAR | Status: DC | PRN
  Administered 2015-09-15: 100 ug via INTRAVENOUS
  Administered 2015-09-15 (×3): 50 ug via INTRAVENOUS

## 2015-09-15 MED ORDER — ONDANSETRON HCL 4 MG/2ML IJ SOLN
INTRAMUSCULAR | Status: AC
  Filled 2015-09-15: qty 2

## 2015-09-15 MED ORDER — MIDAZOLAM HCL 2 MG/2ML IJ SOLN
INTRAMUSCULAR | Status: AC
  Filled 2015-09-15: qty 2

## 2015-09-15 MED ORDER — MORPHINE SULFATE (PF) 2 MG/ML IV SOLN
2.0000 mg | INTRAVENOUS | Status: DC | PRN
Start: 2015-09-15 — End: 2015-09-16
  Administered 2015-09-15: 4 mg via INTRAVENOUS
  Administered 2015-09-16: 2 mg via INTRAVENOUS
  Filled 2015-09-15: qty 1
  Filled 2015-09-15: qty 2

## 2015-09-15 SURGICAL SUPPLY — 42 items
APPLIER CLIP ROT 10 11.4 M/L (STAPLE)
BLADE SURG ROTATE 9660 (MISCELLANEOUS) IMPLANT
CANISTER SUCTION 2500CC (MISCELLANEOUS) ×2 IMPLANT
CHLORAPREP W/TINT 26ML (MISCELLANEOUS) ×2 IMPLANT
CLIP APPLIE ROT 10 11.4 M/L (STAPLE) IMPLANT
CLSR STERI-STRIP ANTIMIC 1/2X4 (GAUZE/BANDAGES/DRESSINGS) ×2 IMPLANT
COVER SURGICAL LIGHT HANDLE (MISCELLANEOUS) ×2 IMPLANT
CUTTER FLEX LINEAR 45M (STAPLE) ×2 IMPLANT
ELECT CAUTERY BLADE 6.4 (BLADE) ×2 IMPLANT
ELECT PENCIL ROCKER SW 15FT (MISCELLANEOUS) ×2 IMPLANT
ELECT REM PT RETURN 9FT ADLT (ELECTROSURGICAL) ×2
ELECTRODE REM PT RTRN 9FT ADLT (ELECTROSURGICAL) ×1 IMPLANT
ENDOLOOP SUT PDS II  0 18 (SUTURE)
ENDOLOOP SUT PDS II 0 18 (SUTURE) IMPLANT
GLOVE BIOGEL M 7.0 STRL (GLOVE) ×4 IMPLANT
GLOVE BIOGEL PI IND STRL 7.0 (GLOVE) ×2 IMPLANT
GLOVE BIOGEL PI IND STRL 8 (GLOVE) ×1 IMPLANT
GLOVE BIOGEL PI INDICATOR 7.0 (GLOVE) ×2
GLOVE BIOGEL PI INDICATOR 8 (GLOVE) ×1
GLOVE ECLIPSE 7.5 STRL STRAW (GLOVE) ×2 IMPLANT
GOWN STRL REUS W/ TWL LRG LVL3 (GOWN DISPOSABLE) ×3 IMPLANT
GOWN STRL REUS W/TWL LRG LVL3 (GOWN DISPOSABLE) ×3
KIT BASIN OR (CUSTOM PROCEDURE TRAY) ×2 IMPLANT
KIT ROOM TURNOVER OR (KITS) ×2 IMPLANT
NS IRRIG 1000ML POUR BTL (IV SOLUTION) ×2 IMPLANT
PAD ARMBOARD 7.5X6 YLW CONV (MISCELLANEOUS) ×4 IMPLANT
POUCH SPECIMEN RETRIEVAL 10MM (ENDOMECHANICALS) ×2 IMPLANT
RELOAD 45 VASCULAR/THIN (ENDOMECHANICALS) IMPLANT
RELOAD STAPLE TA45 3.5 REG BLU (ENDOMECHANICALS) ×2 IMPLANT
SCALPEL HARMONIC ACE (MISCELLANEOUS) ×2 IMPLANT
SET IRRIG TUBING LAPAROSCOPIC (IRRIGATION / IRRIGATOR) ×2 IMPLANT
SLEEVE ENDOPATH XCEL 5M (ENDOMECHANICALS) ×2 IMPLANT
SPECIMEN JAR SMALL (MISCELLANEOUS) ×2 IMPLANT
STRIP CLOSURE SKIN 1/2X4 (GAUZE/BANDAGES/DRESSINGS) ×2 IMPLANT
SUT MNCRL AB 4-0 PS2 18 (SUTURE) ×2 IMPLANT
TOWEL OR 17X24 6PK STRL BLUE (TOWEL DISPOSABLE) ×2 IMPLANT
TOWEL OR 17X26 10 PK STRL BLUE (TOWEL DISPOSABLE) ×2 IMPLANT
TRAY FOLEY CATH 16FR SILVER (SET/KITS/TRAYS/PACK) ×2 IMPLANT
TRAY LAPAROSCOPIC MC (CUSTOM PROCEDURE TRAY) ×2 IMPLANT
TROCAR XCEL BLUNT TIP 100MML (ENDOMECHANICALS) ×2 IMPLANT
TROCAR XCEL NON-BLD 5MMX100MML (ENDOMECHANICALS) ×2 IMPLANT
TUBING INSUFFLATION (TUBING) ×2 IMPLANT

## 2015-09-15 NOTE — Op Note (Signed)
OPERATIVE REPORT  DATE OF OPERATION:  09/15/2015  PATIENT:  Timothy Mccarthy  45 y.o. male  PRE-OPERATIVE DIAGNOSIS:  Acute appendicitis  POST-OPERATIVE DIAGNOSIS:  Acute appendicitis  FINDINGS:  Distended, non-perforated, large appendix.  No rupture  PROCEDURE:  Procedure(s): APPENDECTOMY LAPAROSCOPIC  SURGEON:  Surgeon(s): Judeth Horn, MD  ASSISTANT: None  ANESTHESIA:   general  COMPLICATIONS:  None  EBL: <20 ml  BLOOD ADMINISTERED: none  DRAINS: none   SPECIMEN:  Source of Specimen:  Appendix  COUNTS CORRECT:  YES  PROCEDURE DETAILS: The patient was taken to the operating room and placed on the table in supine position. After an adequate general endotracheal anesthetic was administered he was prepped and draped in usual sterile manner exposing the abdomen.  A proper timeout was performed identifying the patient and the procedure to be performed. A supraumbilical midline incision was made using a #15 blade and as we were dissecting down to the fascia saw that the patient had a small supraumbilical hernia defect for which we took advantage and oriented to perineal cavity. We grabbed the edges with Coker clamps and enlarged it as we perforated into the peritoneal cavity. A pursestring suture of 0 Vicryl was passed around this hernia defect opening and then a Hassan cannula pass into the enlarged area through which carbon dioxide gas was insufflated up to a maximal intra-abdominal pressure of 15 mmHg.  25 mm cannulas were subsequently placed in the right upper quadrant and the left lower quadrant as the patient was placed in Trendelenburg position with the left side tilted down. The patient is could be seen in the right lower quadrant and was enlarged but not perforated and not supportive. We dissected out the mesoappendix using a Harmonic Scalpel and as we skeletonized the appendix to the base of the cecum, subsequently came across the base of the cecum using an Endo GIA blue  cartridge stapler. This detached the appendix completely and we was subsequently able to retrieve it from the umbilical cannula using an Endo Catch bag.  We inspected the area for hemostasis and there was excellent hemostasis. We subsequently aspirated all fluid and gas from the perineal cavity of the patient was leveled on the table. The pursestring suture at the supraumbilical fascia site was used to close the fascial incision. We injected 0.25% Marcaine at all sites. We closed the supraumbilical skin site using a running subcuticular stitch of 4-0 Monocryl. Dermabond, Steri-Strips, and Tegaderm used to complete our dressings. All counts were correct including needles, sponges, and instruments.  PATIENT DISPOSITION:  PACU - hemodynamically stable.   Xzandria Clevinger 8/3/20176:28 PM

## 2015-09-15 NOTE — H&P (Signed)
Timothy Mccarthy is an 45 y.o. male.   Chief Complaint: abd pain HPI: 70 yom consult from Dr Stark Jock presents with right sided abd pain since Monday.  He has no fevers. No n/v, is anorectic, having bms.  His wbc is elevated and ct shows appendicitis.  Past Medical History:  Diagnosis Date  . Adjustment disorder with anxious mood   . Hypertension   . Tobacco use disorder     History reviewed. No pertinent surgical history.  Family History  Problem Relation Age of Onset  . Stroke Mother   . Diabetes type II Mother    Social History:  reports that he has been smoking Cigarettes.  He started smoking about 28 years ago. He has been smoking about 0.50 packs per day. He has never used smokeless tobacco. He reports that he drinks alcohol. He reports that he does not use drugs.  Allergies:  Allergies  Allergen Reactions  . Penicillins Anaphylaxis    Has patient had a PCN reaction causing immediate rash, facial/tongue/throat swelling, SOB or lightheadedness with hypotensionNO Has patient had a PCN reaction causing severe rash involving mucus membranes or skin necrosis: NO Has patient had a PCN reaction that required hospitalization NO Has patient had a PCN reaction occurring within the last 10 years: NO If all of the above answers are "NO", then may proceed with Cephalosporin use.    meds reviewed  Results for orders placed or performed during the hospital encounter of 09/14/15 (from the past 48 hour(s))  Lipase, blood     Status: None   Collection Time: 09/14/15  8:36 PM  Result Value Ref Range   Lipase 19 11 - 51 U/L  Comprehensive metabolic panel     Status: Abnormal   Collection Time: 09/14/15  8:36 PM  Result Value Ref Range   Sodium 138 135 - 145 mmol/L   Potassium 3.2 (L) 3.5 - 5.1 mmol/L   Chloride 101 101 - 111 mmol/L   CO2 28 22 - 32 mmol/L   Glucose, Bld 104 (H) 65 - 99 mg/dL   BUN 10 6 - 20 mg/dL   Creatinine, Ser 1.26 (H) 0.61 - 1.24 mg/dL   Calcium 9.5 8.9 - 10.3 mg/dL    Total Protein 7.4 6.5 - 8.1 g/dL   Albumin 4.1 3.5 - 5.0 g/dL   AST 27 15 - 41 U/L   ALT 35 17 - 63 U/L   Alkaline Phosphatase 102 38 - 126 U/L   Total Bilirubin 0.6 0.3 - 1.2 mg/dL   GFR calc non Af Amer >60 >60 mL/min   GFR calc Af Amer >60 >60 mL/min    Comment: (NOTE) The eGFR has been calculated using the CKD EPI equation. This calculation has not been validated in all clinical situations. eGFR's persistently <60 mL/min signify possible Chronic Kidney Disease.    Anion gap 9 5 - 15  CBC     Status: Abnormal   Collection Time: 09/14/15  8:36 PM  Result Value Ref Range   WBC 14.2 (H) 4.0 - 10.5 K/uL   RBC 5.22 4.22 - 5.81 MIL/uL   Hemoglobin 14.5 13.0 - 17.0 g/dL   HCT 44.5 39.0 - 52.0 %   MCV 85.2 78.0 - 100.0 fL   MCH 27.8 26.0 - 34.0 pg   MCHC 32.6 30.0 - 36.0 g/dL   RDW 14.1 11.5 - 15.5 %   Platelets 264 150 - 400 K/uL  Urinalysis, Routine w reflex microscopic     Status:  Abnormal   Collection Time: 09/14/15  8:36 PM  Result Value Ref Range   Color, Urine YELLOW YELLOW   APPearance CLEAR CLEAR   Specific Gravity, Urine 1.031 (H) 1.005 - 1.030   pH 7.0 5.0 - 8.0   Glucose, UA NEGATIVE NEGATIVE mg/dL   Hgb urine dipstick NEGATIVE NEGATIVE   Bilirubin Urine NEGATIVE NEGATIVE   Ketones, ur NEGATIVE NEGATIVE mg/dL   Protein, ur NEGATIVE NEGATIVE mg/dL   Nitrite NEGATIVE NEGATIVE   Leukocytes, UA SMALL (A) NEGATIVE  Urine microscopic-add on     Status: Abnormal   Collection Time: 09/14/15  8:36 PM  Result Value Ref Range   Squamous Epithelial / LPF 0-5 (A) NONE SEEN   WBC, UA 0-5 0 - 5 WBC/hpf   RBC / HPF NONE SEEN 0 - 5 RBC/hpf   Bacteria, UA RARE (A) NONE SEEN   Ct Abdomen Pelvis W Contrast  Result Date: 09/15/2015 CLINICAL DATA:  45 year old male with right lower quadrant abdominal pain. Concern for appendicitis. EXAM: CT ABDOMEN AND PELVIS WITH CONTRAST TECHNIQUE: Multidetector CT imaging of the abdomen and pelvis was performed using the standard protocol  following bolus administration of intravenous contrast. CONTRAST:  171m ISOVUE-300 IOPAMIDOL (ISOVUE-300) INJECTION 61% COMPARISON:  None. FINDINGS: The visualized lung bases are clear. No intra-abdominal free air or free fluid. The liver, gallbladder, pancreas, spleen, adrenal glands, kidneys, visualized ureters, and urinary bladder appear unremarkable. The prostate and seminal vesicles are grossly unremarkable. High attenuating content within the stomach likely related to ingested material and less likely blood products. There is moderate stool throughout the colon. Small scattered colonic diverticula without active inflammatory changes. No evidence of bowel obstruction. The appendix is enlarged and inflamed. With multiple large stones within the appendix. An obstructing stone is noted at the base of the appendix. The appendix is located in the right lower quadrant inferior to the cecum. There is no evidence of perforation or abscess. There is minimal aortoiliac atherosclerotic disease. No portal venous gas identified. There is no adenopathy. Small fat containing umbilical hernia. The abdominal wall soft tissues are otherwise unremarkable. Degenerative changes of the lumbar spine. The osseous structures are intact. IMPRESSION: Acute appendicitis.  No abscess. Electronically Signed   By: AAnner CreteM.D.   On: 09/15/2015 01:48    Review of Systems  Constitutional: Negative for chills and fever.  Respiratory: Negative for cough and shortness of breath.   Cardiovascular: Negative for chest pain.  Gastrointestinal: Positive for abdominal pain. Negative for constipation, diarrhea, nausea and vomiting.  Genitourinary: Negative for dysuria and urgency.    Blood pressure 128/88, pulse 66, temperature 97.7 F (36.5 C), temperature source Oral, resp. rate 20, SpO2 95 %. Physical Exam  Vitals reviewed. Constitutional: He is oriented to person, place, and time. He appears well-developed and  well-nourished.  HENT:  Head: Normocephalic and atraumatic.  Eyes: No scleral icterus.  Neck: Neck supple.  Cardiovascular: Normal rate, regular rhythm and normal heart sounds.   Respiratory: Effort normal. He has no wheezes. He has no rales.  GI: Soft. Bowel sounds are normal. There is tenderness (rlq).  Lymphadenopathy:    He has no cervical adenopathy.  Neurological: He is alert and oriented to person, place, and time.     Assessment/Plan appendkicitis  Admission, iv abx, npo, discussed lap appy in am  [facility] MD 09/15/2015, 3:49 AM

## 2015-09-15 NOTE — Interval H&P Note (Signed)
History and Physical Interval Note:  I spoke with the patient this AM.  He understands the need for surgery.  I have explained the procedure to him.  He is ready for the OR.  09/15/2015 8:21 AM  Jetta Lout  has presented today for surgery, with the diagnosis of Acute appendicitis  The various methods of treatment have been discussed with the patient and family. After consideration of risks, benefits and other options for treatment, the patient has consented to  Procedure(s): APPENDECTOMY LAPAROSCOPIC (N/A) as a surgical intervention .  The patient's history has been reviewed, patient examined, no change in status, stable for surgery.  I have reviewed the patient's chart and labs.  Questions were answered to the patient's satisfaction.     Layanna Charo

## 2015-09-15 NOTE — Anesthesia Preprocedure Evaluation (Addendum)
Anesthesia Evaluation  Patient identified by MRN, date of birth, ID band Patient awake    Reviewed: Allergy & Precautions, H&P , NPO status , Patient's Chart, lab work & pertinent test results  History of Anesthesia Complications Negative for: history of anesthetic complications  Airway Mallampati: II  TM Distance: >3 FB Neck ROM: full    Dental no notable dental hx. (+) Teeth Intact, Dental Advisory Given   Pulmonary Current Smoker,    Pulmonary exam normal breath sounds clear to auscultation       Cardiovascular hypertension, Normal cardiovascular exam Rhythm:regular Rate:Normal     Neuro/Psych PSYCHIATRIC DISORDERS negative neurological ROS     GI/Hepatic Neg liver ROS, GERD  ,  Endo/Other  negative endocrine ROS  Renal/GU negative Renal ROS     Musculoskeletal   Abdominal   Peds  Hematology negative hematology ROS (+)   Anesthesia Other Findings   Reproductive/Obstetrics negative OB ROS                            Anesthesia Physical Anesthesia Plan  ASA: II  Anesthesia Plan: General   Post-op Pain Management:    Induction: Intravenous  Airway Management Planned: Oral ETT  Additional Equipment:   Intra-op Plan:   Post-operative Plan: Extubation in OR  Informed Consent: I have reviewed the patients History and Physical, chart, labs and discussed the procedure including the risks, benefits and alternatives for the proposed anesthesia with the patient or authorized representative who has indicated his/her understanding and acceptance.   Dental Advisory Given  Plan Discussed with: Anesthesiologist, CRNA and Surgeon  Anesthesia Plan Comments:         Anesthesia Quick Evaluation

## 2015-09-15 NOTE — ED Provider Notes (Signed)
Care assumed from Dr. Tyrone Nine at shift change. Patient with right-sided abdominal pain and elevated white count. He is undergoing a CT scan to rule out the possibility of appendicitis. This has returned and is positive for acute appendicitis. I've discussed this finding with Dr. Donne Hazel from general surgery who will evaluate patient.   Veryl Speak, MD 09/15/15 [phone]

## 2015-09-15 NOTE — Anesthesia Procedure Notes (Signed)
Procedure Name: Intubation Date/Time: 09/15/2015 5:31 PM Performed by: Merdis Delay Pre-anesthesia Checklist: Patient identified, Emergency Drugs available, Suction available, Patient being monitored and Timeout performed Patient Re-evaluated:Patient Re-evaluated prior to inductionOxygen Delivery Method: Circle system utilized Preoxygenation: Pre-oxygenation with 100% oxygen Intubation Type: IV induction Ventilation: Mask ventilation without difficulty Laryngoscope Size: Mac and 4 Grade View: Grade III Tube type: Oral Tube size: 7.5 mm Number of attempts: 1 Airway Equipment and Method: Stylet Placement Confirmation: positive ETCO2,  CO2 detector,  breath sounds checked- equal and bilateral and ETT inserted through vocal cords under direct vision Secured at: 22 cm Tube secured with: Tape Dental Injury: Teeth and Oropharynx as per pre-operative assessment  Difficulty Due To: Difficulty was unanticipated Comments: DL x1 by CRNA with MAC 4- epiglottis view only. DL x1 with MAC 4 by [place] Pink- grade 3 view. Base of arytenoids barely visible.

## 2015-09-15 NOTE — Transfer of Care (Signed)
Immediate Anesthesia Transfer of Care Note  Patient: Timothy Mccarthy  Procedure(s) Performed: Procedure(s): APPENDECTOMY LAPAROSCOPIC (N/A)  Patient Location: PACU  Anesthesia Type:General  Level of Consciousness: awake, alert  and oriented  Airway & Oxygen Therapy: Patient Spontanous Breathing  Post-op Assessment: Report given to RN and Post -op Vital signs reviewed and stable  Post vital signs: Reviewed and stable  Last Vitals:  Vitals:   09/15/15 0952 09/15/15 1349  BP: 105/73 113/79  Pulse: 65 84  Resp: 19 19  Temp: 36.4 C 36.8 C    Last Pain:  Vitals:   09/15/15 1349  TempSrc: Oral  PainSc:          Complications: No apparent anesthesia complications

## 2015-09-16 ENCOUNTER — Encounter (HOSPITAL_COMMUNITY): Payer: Self-pay | Admitting: *Deleted

## 2015-09-16 ENCOUNTER — Encounter (HOSPITAL_COMMUNITY): Payer: Self-pay | Admitting: General Surgery

## 2015-09-16 ENCOUNTER — Encounter: Payer: Self-pay | Admitting: General Surgery

## 2015-09-16 MED ORDER — ACETAMINOPHEN 325 MG PO TABS: 650.0000 mg | ORAL_TABLET | Freq: Four times a day (QID) | ORAL | Status: DC | PRN

## 2015-09-16 MED ORDER — OXYCODONE HCL 10 MG PO TABS: 5.0000 mg | ORAL_TABLET | ORAL | 0 refills | Status: DC | PRN

## 2015-09-16 NOTE — Anesthesia Postprocedure Evaluation (Addendum)
Anesthesia Post Note  Patient: Timothy Mccarthy  Procedure(s) Performed: Procedure(s) (LRB): APPENDECTOMY LAPAROSCOPIC (N/A)  Patient location during evaluation: PACU Anesthesia Type: General Level of consciousness: awake and alert Pain management: pain level controlled Vital Signs Assessment: post-procedure vital signs reviewed and stable Respiratory status: spontaneous breathing, nonlabored ventilation, respiratory function stable and patient connected to nasal cannula oxygen Cardiovascular status: blood pressure returned to baseline and stable Postop Assessment: no signs of nausea or vomiting Anesthetic complications: no    Last Vitals:  Vitals:   09/15/15 2001 09/16/15 0504  BP: 129/83 120/77  Pulse: 91 83  Resp: 16 17  Temp: 36.5 C 36.9 C    Last Pain:  Vitals:   09/16/15 0527  TempSrc:   PainSc: 2                  Zenaida Deed

## 2015-09-16 NOTE — Discharge Instructions (Signed)
Laparoscopic Appendectomy, Adult, Care After °Refer to this sheet in the next few weeks. These instructions provide you with information on caring for yourself after your procedure. Your caregiver may also give you more specific instructions. Your treatment has been planned according to current medical practices, but problems sometimes occur. Call your caregiver if you have any problems or questions after your procedure. °HOME CARE INSTRUCTIONS °· Do not drive while taking narcotic pain medicines. °· Use stool softener if you become constipated from your pain medicines. °· Change your bandages (dressings) as directed. °· Keep your wounds clean and dry. You may wash the wounds gently with soap and water. Gently pat the wounds dry with a clean towel. °· Do not take baths, swim, or use hot tubs for 10 days, or as instructed by your caregiver. °· Only take over-the-counter or prescription medicines for pain, discomfort, or fever as directed by your caregiver. °· You may continue your normal diet as directed. °· Do not lift more than 10 pounds (4.5 kg) or play contact sports for 3 weeks, or as directed. °· Slowly increase your activity after surgery. °· Take deep breaths to avoid getting a lung infection (pneumonia). °SEEK MEDICAL CARE IF: °· You have redness, swelling, or increasing pain in your wounds. °· You have pus coming from your wounds. °· You have drainage from a wound that lasts longer than 1 day. °· You notice a bad smell coming from the wounds or dressing. °· Your wound edges break open after stitches (sutures) have been removed. °· You notice increasing pain in the shoulders (shoulder strap areas) or near your shoulder blades. °· You develop dizzy episodes or fainting while standing. °· You develop shortness of breath. °· You develop persistent nausea or vomiting. °· You cannot control your bowel functions or lose your appetite. °· You develop diarrhea. °SEEK IMMEDIATE MEDICAL CARE IF:  °· You have a  fever. °· You develop a rash. °· You have difficulty breathing or sharp pains in your chest. °· You develop any reaction or side effects to medicines given. °MAKE SURE YOU: °· Understand these instructions. °· Will watch your condition. °· Will get help right away if you are not doing well or get worse. °  °This information is not intended to replace advice given to you by your health care provider. Make sure you discuss any questions you have with your health care provider. °  °Document Released: 01/29/2005 Document Revised: 06/15/2014 Document Reviewed: 07/19/2014 °Elsevier Interactive Patient Education ©2016 [employer] ° °CCS ______[facility] °LAPAROSCOPIC SURGERY: POST OP INSTRUCTIONS °Always review your discharge instruction sheet given to you by the facility where your surgery was performed. °IF YOU HAVE DISABILITY OR FAMILY LEAVE FORMS, YOU MUST BRING THEM TO THE OFFICE FOR PROCESSING.   °DO NOT GIVE THEM TO YOUR DOCTOR. ° °1. A prescription for pain medication may be given to you upon discharge.  Take your pain medication as prescribed, if needed.  If narcotic pain medicine is not needed, then you may take acetaminophen (Tylenol) or ibuprofen (Advil) as needed. °2. Take your usually prescribed medications unless otherwise directed. °3. If you need a refill on your pain medication, please contact your pharmacy.  They will contact our office to request authorization. Prescriptions will not be filled after 5pm or on week-ends. °4. You should follow a light diet the first few days after arrival home, such as soup and crackers, etc.  Be sure to include lots of fluids daily. °5. Most   patients will experience some swelling and bruising in the area of the incisions.  Ice packs will help.  Swelling and bruising can take several days to resolve.  °6. It is common to experience some constipation if taking pain medication after surgery.  Increasing fluid intake and taking a stool softener (such as  Colace) will usually help or prevent this problem from occurring.  A mild laxative (Milk of Magnesia or Miralax) should be taken according to package instructions if there are no bowel movements after 48 hours. °7. Unless discharge instructions indicate otherwise, you may remove your bandages 24-48 hours after surgery, and you may shower at that time.  You may have steri-strips (small skin tapes) in place directly over the incision.  These strips should be left on the skin for 7-10 days.  If your surgeon used skin glue on the incision, you may shower in 24 hours.  The glue will flake off over the next 2-3 weeks.  Any sutures or staples will be removed at the office during your follow-up visit. °8. ACTIVITIES:  You may resume regular (light) daily activities beginning the next day--such as daily self-care, walking, climbing stairs--gradually increasing activities as tolerated.  You may have sexual intercourse when it is comfortable.  Refrain from any heavy lifting or straining until approved by your doctor. °a. You may drive when you are no longer taking prescription pain medication, you can comfortably wear a seatbelt, and you can safely maneuver your car and apply brakes. °b. RETURN TO WORK:  __________________________________________________________ °9. You should see your doctor in the office for a follow-up appointment approximately 2-3 weeks after your surgery.  Make sure that you call for this appointment within a day or two after you arrive home to insure a convenient appointment time. °10. OTHER INSTRUCTIONS: __________________________________________________________________________________________________________________________ __________________________________________________________________________________________________________________________ °WHEN TO CALL YOUR DOCTOR: °1. Fever over 101.0 °2. Inability to urinate °3. Continued bleeding from incision. °4. Increased pain, redness, or drainage from the  incision. °5. Increasing abdominal pain ° °The clinic staff is available to answer your questions during regular business hours.  Please don’t hesitate to call and ask to speak to one of the nurses for clinical concerns.  If you have a medical emergency, go to the nearest emergency room or call 911.  A surgeon from [facility] is always on call at the hospital. °[address], [address], [place], [place]  [address] ? [address] [address], [place], [place]   [address] °[phone] ? [phone] ? FAX [phone] °Web site: [email or web address] ° °

## 2015-09-16 NOTE — Progress Notes (Signed)
IV removed. Discharge paperwork given to patient. No questions verbalized.Prescription and work note given. Patient ready for discharge.

## 2015-09-16 NOTE — Progress Notes (Signed)
1 Day Post-Op  Subjective: He looks good, tolerating soft diet this morning. Plan to mobilize more and discharge later this afternoon.  Objective: Vital signs in last 24 hours: Temp:  [97.6 F (36.4 C)-98.6 F (37 C)] 98.4 F (36.9 C) (08/04 0504) Pulse Rate:  [65-91] 83 (08/04 0504) Resp:  [15-19] 17 (08/04 0504) BP: (105-135)/(73-92) 120/77 (08/04 0504) SpO2:  [92 %-100 %] 100 % (08/04 0504) Last BM Date: 09/12/15 900 by mouth recorded 445 urine recorded. Afebrile vital signs are stable No labs today creatinine was mildly elevated at 1.26 on admission. Intake/Output from previous day: 08/03 0701 - 08/04 0700 In: 2100 [P.O.:900; I.V.:1200] Out: 445 [Urine:425; Blood:20] Intake/Output this shift: Total I/O In: -  Out: 225 [Urine:225]  General appearance: alert, cooperative and no distress GI: Soft nontender tolerating regular diet this a.m. Dressings are dry and intact.  Lab Results:   Recent Labs  09/14/15 2036  WBC 14.2*  HGB 14.5  HCT 44.5  PLT 264    BMET  Recent Labs  09/14/15 2036  NA 138  K 3.2*  CL 101  CO2 28  GLUCOSE 104*  BUN 10  CREATININE 1.26*  CALCIUM 9.5   PT/INR No results for input(s): LABPROT, INR in the last 72 hours.   Recent Labs Lab 09/14/15 2036  AST 27  ALT 35  ALKPHOS 102  BILITOT 0.6  PROT 7.4  ALBUMIN 4.1     Lipase     Component Value Date/Time   LIPASE 19 09/14/2015 2036     Studies/Results: Ct Abdomen Pelvis W Contrast  Result Date: 09/15/2015 CLINICAL DATA:  45 year old male with right lower quadrant abdominal pain. Concern for appendicitis. EXAM: CT ABDOMEN AND PELVIS WITH CONTRAST TECHNIQUE: Multidetector CT imaging of the abdomen and pelvis was performed using the standard protocol following bolus administration of intravenous contrast. CONTRAST:  156mL ISOVUE-300 IOPAMIDOL (ISOVUE-300) INJECTION 61% COMPARISON:  None. FINDINGS: The visualized lung bases are clear. No intra-abdominal free air or free  fluid. The liver, gallbladder, pancreas, spleen, adrenal glands, kidneys, visualized ureters, and urinary bladder appear unremarkable. The prostate and seminal vesicles are grossly unremarkable. High attenuating content within the stomach likely related to ingested material and less likely blood products. There is moderate stool throughout the colon. Small scattered colonic diverticula without active inflammatory changes. No evidence of bowel obstruction. The appendix is enlarged and inflamed. With multiple large stones within the appendix. An obstructing stone is noted at the base of the appendix. The appendix is located in the right lower quadrant inferior to the cecum. There is no evidence of perforation or abscess. There is minimal aortoiliac atherosclerotic disease. No portal venous gas identified. There is no adenopathy. Small fat containing umbilical hernia. The abdominal wall soft tissues are otherwise unremarkable. Degenerative changes of the lumbar spine. The osseous structures are intact. IMPRESSION: Acute appendicitis.  No abscess. Electronically Signed   By: Anner Crete M.D.   On: 09/15/2015 01:48   Prior to Admission medications   Medication Sig Start Date End Date Taking? Authorizing Provider  hydrochlorothiazide (HYDRODIURIL) 25 MG tablet Take 25 mg by mouth daily.   Yes Historical Provider, MD  mirtazapine (REMERON) 15 MG tablet Take 15 mg by mouth at bedtime.   Yes Historical Provider, MD  nicotine (NICODERM CQ - DOSED IN MG/24 HOURS) 14 mg/24hr patch Place 1 patch (14 mg total) onto the skin daily. Patient not taking: Reported on 09/14/2015 04/21/15   Loleta Chance, MD  nicotine polacrilex (NICORETTE) 4 MG  gum Take 1 each (4 mg total) by mouth as needed for smoking cessation. Patient not taking: Reported on 09/14/2015 04/21/15   Loleta Chance, MD    Medications: . sodium chloride   Intravenous Once  . enoxaparin (LOVENOX) injection  40 mg Subcutaneous Q24H  . hydrochlorothiazide  25 mg  Oral Daily  . mirtazapine  15 mg Oral QHS  . mupirocin ointment   Nasal BID  . nicotine  14 mg Transdermal Daily      History of adjustment disorder Hypertension Tobacco use disorder Assessment/Plan Acute appendicitis Laparoscopic appendectomy 09/15/15 Dr. Judeth Horn FEN:  Regular diet ID: Cipro and Flagyl completed yesterday 2 days DVT: Lovenox/SCD    Plan: Home later today, resume preadmission medicines. I recommended he follow-up with his primary care in the next couple weeks to reevaluate medical issues including elevated creatinine he had with admission.   LOS: 1 day    [place] 09/16/2015 [phone]

## 2015-09-19 NOTE — Discharge Summary (Signed)
Physician Discharge Summary  Patient ID: Timothy Mccarthy MRN: [id number] DOB/AGE: 1971/01/04 45 y.o.  Admit date: 09/14/2015 Discharge date: 09/16/2015  Admission Diagnoses:  Acute appendicitis History of adjustment disorder Hypertension Tobacco use disorder  Discharge Diagnoses:  Same   Active Problems:   Appendicitis   PROCEDURES: Laparoscopic appendectomy 09/15/15 Dr. Laurena Slimmer Course:  [name]  He has no fevers. No n/v, is anorectic, having bms.  His wbc is elevated and ct shows appendicitis. He was seen and admitted by Dr. Donne Hazel and taken to the OR later that day.  Post op he did well and was ready for discharge later the next AM.    Condition on d/c:  Improved    CBC Latest Ref Rng & Units 09/14/2015 07/09/2012  WBC 4.0 - 10.5 K/uL 14.2(H) 12.9(H)  Hemoglobin 13.0 - 17.0 g/dL 14.5 15.0  Hematocrit 39.0 - 52.0 % 44.5 45.1  Platelets 150 - 400 K/uL 264 288   CMP Latest Ref Rng & Units 09/14/2015 07/09/2012  Glucose 65 - 99 mg/dL 104(H) 127(H)  BUN 6 - 20 mg/dL 10 9  Creatinine 0.61 - 1.24 mg/dL 1.26(H) 1.11  Sodium 135 - 145 mmol/L 138 143  Potassium 3.5 - 5.1 mmol/L 3.2(L) 3.9  Chloride 101 - 111 mmol/L 101 106  CO2 22 - 32 mmol/L 28 21  Calcium 8.9 - 10.3 mg/dL 9.5 9.9  Total Protein 6.5 - 8.1 g/dL 7.4 8.1  Total Bilirubin 0.3 - 1.2 mg/dL 0.6 0.4  Alkaline Phos 38 - 126 U/L 102 101  AST 15 - 41 U/L 27 17  ALT 17 - 63 U/L 35 16   Appendix, Other than Incidental - ACUTE APPENDICITIS.  Disposition: 01-Home or Self Care     Medication List    TAKE these medications   acetaminophen 325 MG tablet Commonly known as:  TYLENOL Take 2 tablets (650 mg total) by mouth every 6 (six) hours as needed for mild pain (or temp > 100).   hydrochlorothiazide 25 MG tablet Commonly known as:  HYDRODIURIL Take 25 mg by mouth daily.   mirtazapine 15 MG tablet Commonly known as:  REMERON Take 15  mg by mouth at bedtime.   nicotine 14 mg/24hr patch Commonly known as:  NICODERM CQ - dosed in mg/24 hours Place 1 patch (14 mg total) onto the skin daily.   nicotine polacrilex 4 MG gum Commonly known as:  NICORETTE Take 1 each (4 mg total) by mouth as needed for smoking cessation.   Oxycodone HCl 10 MG Tabs Take 0.5-1.5 tablets (5-15 mg total) by mouth every 4 (four) hours as needed for moderate pain or severe pain.      Follow-up Information    [facility] Follow up on 10/05/2015.   Specialty:  General Surgery Why:  Your appointment is at 9:15 AM.  Be at the office 30 minutes early for check-in. Contact her primary care physician. Have him follow-up for medical management. Of note your kidney function was slightly abnormal on admission. Be sure they recheck this. Contact information: [address]  [place] [id number] [phone]           Signed: Earnstine Regal 09/19/2015, 2:40 PM

## 2015-09-28 ENCOUNTER — Encounter (HOSPITAL_COMMUNITY): Payer: Self-pay

## 2015-09-28 ENCOUNTER — Emergency Department (HOSPITAL_COMMUNITY): Payer: BLUE CROSS/BLUE SHIELD

## 2015-09-28 ENCOUNTER — Observation Stay (HOSPITAL_COMMUNITY)
Admission: EM | Admit: 2015-09-28 | Discharge: 2015-09-30 | Disposition: A | Discharge status: Home / Self Care | Payer: BLUE CROSS/BLUE SHIELD | Attending: Surgery | Admitting: Surgery

## 2015-09-28 DIAGNOSIS — K219 Gastro-esophageal reflux disease without esophagitis: Secondary | ICD-10-CM | POA: Diagnosis not present

## 2015-09-28 DIAGNOSIS — R1900 Intra-abdominal and pelvic swelling, mass and lump, unspecified site: Secondary | ICD-10-CM

## 2015-09-28 DIAGNOSIS — I10 Essential (primary) hypertension: Secondary | ICD-10-CM | POA: Diagnosis not present

## 2015-09-28 DIAGNOSIS — Z79899 Other long term (current) drug therapy: Secondary | ICD-10-CM | POA: Insufficient documentation

## 2015-09-28 DIAGNOSIS — K430 Incisional hernia with obstruction, without gangrene: Principal | ICD-10-CM | POA: Insufficient documentation

## 2015-09-28 DIAGNOSIS — R109 Unspecified abdominal pain: Secondary | ICD-10-CM | POA: Diagnosis present

## 2015-09-28 DIAGNOSIS — K469 Unspecified abdominal hernia without obstruction or gangrene: Secondary | ICD-10-CM | POA: Diagnosis present

## 2015-09-28 DIAGNOSIS — F17210 Nicotine dependence, cigarettes, uncomplicated: Secondary | ICD-10-CM | POA: Diagnosis not present

## 2015-09-28 DIAGNOSIS — K439 Ventral hernia without obstruction or gangrene: Secondary | ICD-10-CM

## 2015-09-28 HISTORY — DX: Depression, unspecified: F32.A

## 2015-09-28 HISTORY — DX: Anxiety disorder, unspecified: F41.9

## 2015-09-28 HISTORY — DX: Unspecified osteoarthritis, unspecified site: M19.90

## 2015-09-28 HISTORY — DX: Major depressive disorder, single episode, unspecified: F32.9

## 2015-09-28 HISTORY — DX: Gastro-esophageal reflux disease without esophagitis: K21.9

## 2015-09-28 LAB — CBC WITH DIFFERENTIAL/PLATELET
Basophils Absolute: 0 10*3/uL (ref 0.0–0.1)
Basophils Relative: 0 %
Eosinophils Absolute: 0.1 10*3/uL (ref 0.0–0.7)
Eosinophils Relative: 2 %
HCT: 40.5 % (ref 39.0–52.0)
Hemoglobin: 13.2 g/dL (ref 13.0–17.0)
Lymphocytes Relative: 25 %
Lymphs Abs: 2.4 10*3/uL (ref 0.7–4.0)
MCH: 27.4 pg (ref 26.0–34.0)
MCHC: 32.6 g/dL (ref 30.0–36.0)
MCV: 84 fL (ref 78.0–100.0)
Monocytes Absolute: 0.4 10*3/uL (ref 0.1–1.0)
Monocytes Relative: 4 %
Neutro Abs: 6.5 10*3/uL (ref 1.7–7.7)
Neutrophils Relative %: 69 %
Platelets: 256 10*3/uL (ref 150–400)
RBC: 4.82 MIL/uL (ref 4.22–5.81)
RDW: 13.9 % (ref 11.5–15.5)
WBC: 9.5 10*3/uL (ref 4.0–10.5)

## 2015-09-28 LAB — COMPREHENSIVE METABOLIC PANEL
ALT: 27 U/L (ref 17–63)
AST: 26 U/L (ref 15–41)
Albumin: 3.8 g/dL (ref 3.5–5.0)
Alkaline Phosphatase: 83 U/L (ref 38–126)
Anion gap: 8 (ref 5–15)
BUN: 10 mg/dL (ref 6–20)
CO2: 26 mmol/L (ref 22–32)
Calcium: 9.9 mg/dL (ref 8.9–10.3)
Chloride: 104 mmol/L (ref 101–111)
Creatinine, Ser: 1.34 mg/dL — ABNORMAL HIGH (ref 0.61–1.24)
GFR calc Af Amer: >60 mL/min (ref >60)
GFR calc non Af Amer: >60 mL/min (ref >60)
Glucose, Bld: 99 mg/dL (ref 65–99)
Potassium: 3.8 mmol/L (ref 3.5–5.1)
Sodium: 138 mmol/L (ref 135–145)
Total Bilirubin: 0.5 mg/dL (ref 0.3–1.2)
Total Protein: 6.7 g/dL (ref 6.5–8.1)

## 2015-09-28 LAB — LIPASE, BLOOD: Lipase: 60 U/L — ABNORMAL HIGH (ref 11–51)

## 2015-09-28 MED ORDER — HYDROMORPHONE HCL 1 MG/ML IJ SOLN
1.0000 mg | INTRAMUSCULAR | Status: DC | PRN
  Administered 2015-09-29: 1 mg via INTRAVENOUS
  Filled 2015-09-28: qty 1

## 2015-09-28 MED ORDER — MIRTAZAPINE 15 MG PO TABS
15.0000 mg | ORAL_TABLET | Freq: Every day | ORAL | Status: DC
  Administered 2015-09-28 – 2015-09-29 (×2): 15 mg via ORAL
  Filled 2015-09-28 (×3): qty 1

## 2015-09-28 MED ORDER — ONDANSETRON 4 MG PO TBDP: 4.0000 mg | ORAL_TABLET | Freq: Four times a day (QID) | ORAL | Status: DC | PRN

## 2015-09-28 MED ORDER — ACETAMINOPHEN 650 MG RE SUPP: 650.0000 mg | Freq: Four times a day (QID) | RECTAL | Status: DC | PRN

## 2015-09-28 MED ORDER — IOPAMIDOL (ISOVUE-300) INJECTION 61%
INTRAVENOUS | Status: AC
  Administered 2015-09-28: 100 mL
  Filled 2015-09-28: qty 100

## 2015-09-28 MED ORDER — POTASSIUM CHLORIDE IN NACL 20-0.9 MEQ/L-% IV SOLN
INTRAVENOUS | Status: DC
Start: 2015-09-28 — End: 2015-09-30
  Administered 2015-09-28 – 2015-09-30 (×4): via INTRAVENOUS
  Filled 2015-09-28 (×2): qty 1000

## 2015-09-28 MED ORDER — OXYCODONE HCL 5 MG PO TABS
5.0000 mg | ORAL_TABLET | ORAL | Status: DC | PRN
  Administered 2015-09-29 – 2015-09-30 (×3): 10 mg via ORAL
  Filled 2015-09-28 (×3): qty 2

## 2015-09-28 MED ORDER — SODIUM CHLORIDE 0.9 % IV BOLUS (SEPSIS)
500.0000 mL | Freq: Once | INTRAVENOUS | Status: AC
  Administered 2015-09-28: 500 mL via INTRAVENOUS

## 2015-09-28 MED ORDER — ONDANSETRON HCL 4 MG/2ML IJ SOLN
4.0000 mg | Freq: Once | INTRAMUSCULAR | Status: AC
  Administered 2015-09-28: 4 mg via INTRAVENOUS
  Filled 2015-09-28: qty 2

## 2015-09-28 MED ORDER — METHOCARBAMOL 500 MG PO TABS
500.0000 mg | ORAL_TABLET | Freq: Four times a day (QID) | ORAL | Status: DC | PRN
  Administered 2015-09-29: 500 mg via ORAL
  Filled 2015-09-28: qty 1

## 2015-09-28 MED ORDER — NICOTINE 21 MG/24HR TD PT24: 21.0000 mg | MEDICATED_PATCH | Freq: Every day | TRANSDERMAL | Status: DC

## 2015-09-28 MED ORDER — NICOTINE 14 MG/24HR TD PT24
14.0000 mg | MEDICATED_PATCH | TRANSDERMAL | Status: DC
  Administered 2015-09-28 – 2015-09-29 (×2): 14 mg via TRANSDERMAL
  Filled 2015-09-28 (×2): qty 1

## 2015-09-28 MED ORDER — HYDROCHLOROTHIAZIDE 25 MG PO TABS
25.0000 mg | ORAL_TABLET | Freq: Every day | ORAL | Status: DC
  Administered 2015-09-29 – 2015-09-30 (×2): 25 mg via ORAL
  Filled 2015-09-28 (×2): qty 1

## 2015-09-28 MED ORDER — ACETAMINOPHEN 325 MG PO TABS: 650.0000 mg | ORAL_TABLET | Freq: Four times a day (QID) | ORAL | Status: DC | PRN

## 2015-09-28 MED ORDER — PNEUMOCOCCAL VAC POLYVALENT 25 MCG/0.5ML IJ INJ: 0.5000 mL | INJECTION | INTRAMUSCULAR | Status: DC

## 2015-09-28 MED ORDER — MORPHINE SULFATE (PF) 4 MG/ML IV SOLN
4.0000 mg | Freq: Once | INTRAVENOUS | Status: AC
  Administered 2015-09-28: 4 mg via INTRAVENOUS
  Filled 2015-09-28: qty 1

## 2015-09-28 MED ORDER — ONDANSETRON HCL 4 MG/2ML IJ SOLN
4.0000 mg | Freq: Four times a day (QID) | INTRAMUSCULAR | Status: DC | PRN
  Administered 2015-09-29: 4 mg via INTRAVENOUS

## 2015-09-28 NOTE — ED Triage Notes (Signed)
Pt presents with a pulling sensation from recent surgical site to lower mid abdomen from appendectomy on 8/3.  Pt reports having no problems until today, he was on 1 knee fixing a screen, stood up with pulling felt in lower abdomen.  +nausea and vomiting on scene after eating sandwich and water.

## 2015-09-28 NOTE — ED Provider Notes (Signed)
[facility] DEPT Provider Note   CSN: [id number] Arrival date & time: 09/28/15  1216     History   Chief Complaint Chief Complaint  Patient presents with  . Abdominal Pain    HPI Timothy Mccarthy is a 45 y.o. male.  Status post appendectomy on 09/15/15 by Dr. Hulen Skains.  Patient was at work today when he did some gentle lifting. He then complained of severe pain just superior to his umbilicus. No vomiting, diarrhea, fever, sweats, chills, dysuria. Severity of pain is moderate.  Pain is worse with palpation.      Past Medical History:  Diagnosis Date  . Adjustment disorder with anxious mood   . Hypertension   . Tobacco use disorder     Patient Active Problem List   Diagnosis Date Noted  . Appendicitis 09/15/2015  . Tobacco use disorder 04/21/2015  . Adjustment disorder with anxious mood 04/21/2015  . Health care maintenance 04/21/2015  . GERD (gastroesophageal reflux disease) 04/21/2015    Past Surgical History:  Procedure Laterality Date  . LAPAROSCOPIC APPENDECTOMY N/A 09/15/2015   Procedure: APPENDECTOMY LAPAROSCOPIC;  Surgeon: Judeth Horn, MD;  Location: [facility];  Service: General;  Laterality: N/A;  . LUMPS ON BACK  2013  . WISDOM TOOTH EXTRACTION         Home Medications    Prior to Admission medications   Medication Sig Start Date End Date Taking? Authorizing Provider  acetaminophen (TYLENOL) 325 MG tablet Take 2 tablets (650 mg total) by mouth every 6 (six) hours as needed for mild pain (or temp > 100). 09/16/15   Earnstine Regal, PA-C  hydrochlorothiazide (HYDRODIURIL) 25 MG tablet Take 25 mg by mouth daily.    Historical Provider, MD  mirtazapine (REMERON) 15 MG tablet Take 15 mg by mouth at bedtime.    Historical Provider, MD  nicotine (NICODERM CQ - DOSED IN MG/24 HOURS) 14 mg/24hr patch Place 1 patch (14 mg total) onto the skin daily. Patient not taking: Reported on 09/14/2015 04/21/15   Loleta Chance, MD  nicotine polacrilex (NICORETTE) 4 MG gum Take 1 each (4  mg total) by mouth as needed for smoking cessation. Patient not taking: Reported on 09/14/2015 04/21/15   Loleta Chance, MD  oxyCODONE 10 MG TABS Take 0.5-1.5 tablets (5-15 mg total) by mouth every 4 (four) hours as needed for moderate pain or severe pain. 09/16/15   Earnstine Regal, PA-C    Family History Family History  Problem Relation Age of Onset  . Stroke Mother   . Diabetes type II Mother     Social History Social History  Substance Use Topics  . Smoking status: Current Every Day Smoker    Packs/day: 0.50    Years: 10.00    Types: Cigarettes    Start date: 04/21/1987  . Smokeless tobacco: Never Used  . Alcohol use 0.0 oz/week     Comment: sometimes     Allergies   Penicillins   Review of Systems Review of Systems  All other systems reviewed and are negative.    Physical Exam Updated Vital Signs BP 143/94   Pulse 86   Temp 98.2 F (36.8 C) (Oral)   Resp 18   Ht 5\' 7"  (1.702 m)   Wt 200 lb (90.7 kg)   SpO2 98%   BMI 31.32 kg/m   Physical Exam  Constitutional: He appears well-developed and well-nourished.  HENT:  Head: Normocephalic and atraumatic.  Eyes: Conjunctivae are normal.  Neck: Neck supple.  Cardiovascular: Normal rate and  regular rhythm.   No murmur heard. Pulmonary/Chest: Effort normal and breath sounds normal. No respiratory distress.  Abdominal: Soft. There is no tenderness.  3 x 3 cm hernia palpated in central abdominal wall superior to umbilicus.  Musculoskeletal: He exhibits no edema.  Neurological: He is alert.  Skin: Skin is warm and dry.  Psychiatric: He has a normal mood and affect.  Nursing note and vitals reviewed.    ED Treatments / Results  Labs (all labs ordered are listed, but only abnormal results are displayed) Labs Reviewed  CBC WITH DIFFERENTIAL/PLATELET  COMPREHENSIVE METABOLIC PANEL  LIPASE, BLOOD    EKG  EKG Interpretation None       Radiology No results found.  Procedures Procedures (including  critical care time)  Medications Ordered in ED Medications  sodium chloride 0.9 % bolus 500 mL (not administered)  ondansetron (ZOFRAN) injection 4 mg (not administered)  morphine 4 MG/ML injection 4 mg (not administered)     Initial Impression / Assessment and Plan / ED Course  I have reviewed the triage vital signs and the nursing notes.  Pertinent labs & imaging results that were available during my care of the patient were reviewed by me and considered in my medical decision making (see chart for details).  Clinical Course   Will consult general surgery  Final Clinical Impressions(s) / ED Diagnoses   Final diagnoses:  None    New Prescriptions New Prescriptions   No medications on file     Nat Christen, MD 10/02/15 [phone]

## 2015-09-28 NOTE — H&P (Signed)
Timothy Mccarthy  Timothy Mccarthy [facility] 1971-01-19  [id number].    Requesting MD: Dr. Nat Christen, ED Chief Complaint/Reason for Consult: abdominal bulge HPI:  45 y/o male with PMH HTN, GERD, anxiety and tobacco use, s/p laparoscopic appendectomy 09/15/15 by Dr. Hulen Skains. He presents to [facility] with an abdominal bulge associated with pain and nausea/vomiting. States that about an hour ago he was at work kneeling, and when he stood up he felt a sharp pain in his abdomen. The pain/bulge is located around his umbilical incision, and made worse with motion or palpation. States that he tried to drink some water but he had 2 subsequent episodes of nausea and vomiting. Describes the pain as a pulling sensation. Denies groin pain. Prior to this episode he was doing very well postoperatively. Last BM early this morning. Since arriving at ED he has continued to pass gas. Last meal was a sandwich at about 1300.  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 Stroke Mother   . Diabetes type II Mother     Past Medical History:  Diagnosis Date  . Adjustment disorder with anxious mood   . Hypertension   . Tobacco use disorder     Past Surgical History:  Procedure Laterality Date  . LAPAROSCOPIC APPENDECTOMY N/A 09/15/2015   Procedure: APPENDECTOMY LAPAROSCOPIC;  Surgeon: Judeth Horn, MD;  Location: [facility];  Service: General;  Laterality: N/A;  . LUMPS ON BACK  2013  . WISDOM TOOTH EXTRACTION      Social History:  reports that he has been smoking Cigarettes.  He started smoking about 28 years ago. He has a 5.00 pack-year smoking history. He has never used smokeless tobacco. He reports that he drinks alcohol. He reports that he does not use drugs.  Allergies:  Allergies  Allergen Reactions  . Penicillins Anaphylaxis    Has patient had a PCN  reaction causing immediate rash, facial/tongue/throat swelling, SOB or lightheadedness with hypotensionNO Has patient had a PCN reaction causing severe rash involving mucus membranes or skin necrosis: NO Has patient had a PCN reaction that required hospitalization NO Has patient had a PCN reaction occurring within the last 10 years: NO If all of the above answers are "NO", then may proceed with Cephalosporin use.     (Not in a hospital admission)  Blood pressure 143/94, pulse 86, temperature 98.2 F (36.8 C), temperature source Oral, resp. rate 18, height '5\' 7"'  (1.702 m), weight 90.7 kg (200 lb), SpO2 98 %. Physical Exam: General: pleasant, WD/WN AA male who is laying in bed in NAD HEENT: head is normocephalic, atraumatic.  Sclera are noninjected.  PERRL. Heart: regular, rate, and rhythm.  No obvious murmurs, gallops, or rubs noted.  Palpable pedal pulses bilaterally Lungs: CTAB, no wheezes, rhonchi, or rales noted.  Respiratory effort nonlabored Abd: soft, ND, +BS. Well healed laparoscopic incision sites. Tender 3x3cm firm hernia palpated just proximal to umbilicus in the umbilical portal site Skin: warm and dry with no erythema Psych: A&Ox3 with an appropriate affect.  Results for orders placed or performed during the hospital encounter of 09/28/15 (from the past 48 hour(s))  CBC with Differential     Status: None   Collection Time: 09/28/15 12:55 PM  Result Value Ref Range   WBC 9.5 4.0 - 10.5 K/uL   RBC 4.82 4.22 - 5.81 MIL/uL  Hemoglobin 13.2 13.0 - 17.0 g/dL   HCT 40.5 39.0 - 52.0 %   MCV 84.0 78.0 - 100.0 fL   MCH 27.4 26.0 - 34.0 pg   MCHC 32.6 30.0 - 36.0 g/dL   RDW 13.9 11.5 - 15.5 %   Platelets 256 150 - 400 K/uL   Neutrophils Relative % 69 %   Neutro Abs 6.5 1.7 - 7.7 K/uL   Lymphocytes Relative 25 %   Lymphs Abs 2.4 0.7 - 4.0 K/uL   Monocytes Relative 4 %   Monocytes Absolute 0.4 0.1 - 1.0 K/uL   Eosinophils Relative 2 %   Eosinophils Absolute 0.1 0.0 - 0.7 K/uL    Basophils Relative 0 %   Basophils Absolute 0.0 0.0 - 0.1 K/uL  Comprehensive metabolic panel     Status: Abnormal   Collection Time: 09/28/15 12:55 PM  Result Value Ref Range   Sodium 138 135 - 145 mmol/L   Potassium 3.8 3.5 - 5.1 mmol/L   Chloride 104 101 - 111 mmol/L   CO2 26 22 - 32 mmol/L   Glucose, Bld 99 65 - 99 mg/dL   BUN 10 6 - 20 mg/dL   Creatinine, Ser 1.34 (H) 0.61 - 1.24 mg/dL   Calcium 9.9 8.9 - 10.3 mg/dL   Total Protein 6.7 6.5 - 8.1 g/dL   Albumin 3.8 3.5 - 5.0 g/dL   AST 26 15 - 41 U/L   ALT 27 17 - 63 U/L   Alkaline Phosphatase 83 38 - 126 U/L   Total Bilirubin 0.5 0.3 - 1.2 mg/dL   GFR calc non Af Amer >60 >60 mL/min   GFR calc Af Amer >60 >60 mL/min    Comment: (Mccarthy) The eGFR has been calculated using the CKD EPI equation. This calculation has not been validated in all clinical situations. eGFR's persistently <60 mL/min signify possible Chronic Kidney Disease.    Anion gap 8 5 - 15  Lipase, blood     Status: Abnormal   Collection Time: 09/28/15 12:55 PM  Result Value Ref Range   Lipase 60 (H) 11 - 51 U/L   Ct Abdomen Pelvis W Contrast  Result Date: 09/28/2015 CLINICAL DATA:  Umbilical abdominal pain starting this morning. Patient had appendectomy on 09/15/2015. EXAM: CT ABDOMEN AND PELVIS WITH CONTRAST TECHNIQUE: Multidetector CT imaging of the abdomen and pelvis was performed using the standard protocol following bolus administration of intravenous contrast. CONTRAST:  120m ISOVUE-300 IOPAMIDOL (ISOVUE-300) INJECTION 61% COMPARISON:  09/15/2015 FINDINGS: Lower chest:  Compressive atelectasis posterior lung bases. Hepatobiliary: No focal abnormality within the liver parenchyma. Multiple folds noted in the gallbladder, otherwise unremarkable. No intrahepatic or extrahepatic biliary dilation. Pancreas: No focal mass lesion. No dilatation of the main duct. No intraparenchymal cyst. No peripancreatic edema. Spleen: No splenomegaly. No focal mass lesion.  Adrenals/Urinary Tract: No adrenal nodule or mass. Kidneys are unremarkable. No evidence for hydroureter. The urinary bladder appears normal for the degree of distention. Stomach/Bowel: Stomach is nondistended. No gastric wall thickening. No evidence of outlet obstruction. Duodenum is normally positioned as is the ligament of Treitz. No small bowel wall thickening. No small bowel dilatation. The terminal ileum is normal. Nonvisualization of the appendix is consistent with the reported history of appendectomy. No gross colonic mass. No colonic wall thickening. No substantial diverticular change. Vascular/Lymphatic: There is abdominal aortic atherosclerosis without aneurysm. There is no gastrohepatic or hepatoduodenal ligament lymphadenopathy. No intraperitoneal or retroperitoneal lymphadenopathy. No pelvic sidewall lymphadenopathy. Reproductive: The prostate gland and seminal vesicles  have normal imaging features. Other: No intraperitoneal free fluid. No evidence for intraperitoneal abscess. No intraperitoneal free air. Musculoskeletal: On the previous study, the patient had a small supraumbilical midline ventral hernia about 2 cm cranial to the umbilicus. This persists on today's study and there is some edema/ inflammation associated with the fat containing hernia sac that measures approximately 1.6 x 1.9 x 2.3 cm. There is no bowel contained in the hernia. There is no focal fluid collection in the anterior abdominal wall to suggest abscess. IMPRESSION: 1. No evidence for intraperitoneal free fluid or intraperitoneal abscess in this patient status post recent cholecystectomy. 2. Small supraumbilical midline ventral hernia contains only fat although there is edema/inflammation associated with the hernia sac. No abscess or fluid within the hernia. Electronically Signed   By: Misty Stanley M.D.   On: 09/28/2015 15:03      Assessment/Plan 1.  Attempted to reduce hernia, unsuccessful.  2.  Admit to med-surg 3.   NPO after midnight, IVF, pain control, antiemetics 4.  SCD's for DVT proph 5.  Ambulate and IS   Jerrye Beavers, [facility] 09/28/2015, 1:56 PM Pager: [phone] Consults: [phone] Mon-Fri 7:00 am-4:30 pm Sat-Sun 7:00 am-11:30 am

## 2015-09-28 NOTE — ED Notes (Signed)
General surgery at bedside for consult

## 2015-09-29 ENCOUNTER — Encounter (HOSPITAL_COMMUNITY): Payer: Self-pay | Admitting: Certified Registered Nurse Anesthetist

## 2015-09-29 ENCOUNTER — Observation Stay (HOSPITAL_COMMUNITY): Payer: BLUE CROSS/BLUE SHIELD | Admitting: Certified Registered Nurse Anesthetist

## 2015-09-29 ENCOUNTER — Encounter (HOSPITAL_COMMUNITY)
Admission: EM | Disposition: A | Discharge status: Home / Self Care | Payer: Self-pay | Source: Home / Self Care | Attending: Emergency Medicine

## 2015-09-29 HISTORY — PX: INCISIONAL HERNIA REPAIR: SHX193

## 2015-09-29 SURGERY — REPAIR, HERNIA, INCISIONAL
Anesthesia: General | Site: Abdomen

## 2015-09-29 MED ORDER — FENTANYL CITRATE (PF) 100 MCG/2ML IJ SOLN
INTRAMUSCULAR | Status: DC | PRN
  Administered 2015-09-29: 100 ug via INTRAVENOUS
  Administered 2015-09-29: 50 ug via INTRAVENOUS

## 2015-09-29 MED ORDER — MIDAZOLAM HCL 5 MG/5ML IJ SOLN
INTRAMUSCULAR | Status: DC | PRN
  Administered 2015-09-29: 2 mg via INTRAVENOUS

## 2015-09-29 MED ORDER — CLINDAMYCIN PHOSPHATE 900 MG/50ML IV SOLN
900.0000 mg | INTRAVENOUS | Status: AC
  Administered 2015-09-29: 900 mg via INTRAVENOUS
  Filled 2015-09-29: qty 50

## 2015-09-29 MED ORDER — PROMETHAZINE HCL 25 MG/ML IJ SOLN
INTRAMUSCULAR | Status: AC
  Filled 2015-09-29: qty 1

## 2015-09-29 MED ORDER — ACETAMINOPHEN 10 MG/ML IV SOLN
1000.0000 mg | Freq: Once | INTRAVENOUS | Status: AC
  Administered 2015-09-29: 1000 mg via INTRAVENOUS

## 2015-09-29 MED ORDER — SUCCINYLCHOLINE CHLORIDE 20 MG/ML IJ SOLN
INTRAMUSCULAR | Status: DC | PRN
  Administered 2015-09-29: 100 mg via INTRAVENOUS

## 2015-09-29 MED ORDER — MEPERIDINE HCL 25 MG/ML IJ SOLN: 6.2500 mg | INTRAMUSCULAR | Status: DC | PRN

## 2015-09-29 MED ORDER — PROPOFOL 10 MG/ML IV BOLUS
INTRAVENOUS | Status: DC | PRN
  Administered 2015-09-29: 200 mg via INTRAVENOUS

## 2015-09-29 MED ORDER — HYDROMORPHONE HCL 1 MG/ML IJ SOLN
INTRAMUSCULAR | Status: AC
  Filled 2015-09-29: qty 1

## 2015-09-29 MED ORDER — ONDANSETRON HCL 4 MG/2ML IJ SOLN
INTRAMUSCULAR | Status: AC
  Filled 2015-09-29: qty 2

## 2015-09-29 MED ORDER — HYDROMORPHONE HCL 1 MG/ML IJ SOLN
0.2500 mg | INTRAMUSCULAR | Status: DC | PRN
  Administered 2015-09-29 (×2): 0.5 mg via INTRAVENOUS

## 2015-09-29 MED ORDER — HYDRALAZINE HCL 20 MG/ML IJ SOLN
INTRAMUSCULAR | Status: AC
  Filled 2015-09-29: qty 1

## 2015-09-29 MED ORDER — SUGAMMADEX SODIUM 200 MG/2ML IV SOLN
INTRAVENOUS | Status: DC | PRN
  Administered 2015-09-29: 180 mg via INTRAVENOUS

## 2015-09-29 MED ORDER — LACTATED RINGERS IV SOLN
INTRAVENOUS | Status: DC
  Administered 2015-09-29: 10:00:00 via INTRAVENOUS

## 2015-09-29 MED ORDER — ROCURONIUM BROMIDE 10 MG/ML (PF) SYRINGE
PREFILLED_SYRINGE | INTRAVENOUS | Status: AC
  Filled 2015-09-29: qty 10

## 2015-09-29 MED ORDER — OXYCODONE-ACETAMINOPHEN 5-325 MG PO TABS
1.0000 | ORAL_TABLET | ORAL | Status: DC | PRN
  Administered 2015-09-29 – 2015-09-30 (×3): 2 via ORAL
  Filled 2015-09-29 (×2): qty 2

## 2015-09-29 MED ORDER — OXYCODONE-ACETAMINOPHEN 5-325 MG PO TABS
ORAL_TABLET | ORAL | Status: AC
  Filled 2015-09-29: qty 2

## 2015-09-29 MED ORDER — 0.9 % SODIUM CHLORIDE (POUR BTL) OPTIME
TOPICAL | Status: DC | PRN
Start: 2015-09-29 — End: 2015-09-29
  Administered 2015-09-29: 1000 mL

## 2015-09-29 MED ORDER — MIDAZOLAM HCL 2 MG/2ML IJ SOLN
1.0000 mg | INTRAMUSCULAR | Status: DC | PRN
  Administered 2015-09-29: 1 mg via INTRAVENOUS

## 2015-09-29 MED ORDER — FENTANYL CITRATE (PF) 100 MCG/2ML IJ SOLN
INTRAMUSCULAR | Status: AC
  Filled 2015-09-29: qty 4

## 2015-09-29 MED ORDER — MIDAZOLAM HCL 2 MG/2ML IJ SOLN
INTRAMUSCULAR | Status: AC
  Filled 2015-09-29: qty 2

## 2015-09-29 MED ORDER — ACETAMINOPHEN 10 MG/ML IV SOLN
INTRAVENOUS | Status: AC
Start: 2015-09-29 — End: 2015-09-30
  Filled 2015-09-29: qty 100

## 2015-09-29 MED ORDER — LIDOCAINE HCL (CARDIAC) 20 MG/ML IV SOLN
INTRAVENOUS | Status: DC | PRN
  Administered 2015-09-29: 80 mg via INTRAVENOUS

## 2015-09-29 MED ORDER — ROCURONIUM BROMIDE 100 MG/10ML IV SOLN
INTRAVENOUS | Status: DC | PRN
  Administered 2015-09-29: 20 mg via INTRAVENOUS

## 2015-09-29 MED ORDER — PROMETHAZINE HCL 25 MG/ML IJ SOLN
6.2500 mg | INTRAMUSCULAR | Status: DC | PRN
Start: 2015-09-29 — End: 2015-09-29
  Administered 2015-09-29: 6.25 mg via INTRAVENOUS

## 2015-09-29 MED ORDER — HYDRALAZINE HCL 20 MG/ML IJ SOLN
INTRAMUSCULAR | Status: DC | PRN
  Administered 2015-09-29 (×2): 5 mg via INTRAVENOUS

## 2015-09-29 MED ORDER — LIDOCAINE 2% (20 MG/ML) 5 ML SYRINGE
INTRAMUSCULAR | Status: AC
  Filled 2015-09-29: qty 5

## 2015-09-29 MED ORDER — SODIUM CHLORIDE 0.9% FLUSH: 3.0000 mL | INTRAVENOUS | Status: DC | PRN

## 2015-09-29 SURGICAL SUPPLY — 33 items
CANISTER SUCTION 2500CC (MISCELLANEOUS) ×2 IMPLANT
CHLORAPREP W/TINT 26ML (MISCELLANEOUS) ×2 IMPLANT
COVER SURGICAL LIGHT HANDLE (MISCELLANEOUS) ×2 IMPLANT
DRAIN CHANNEL 19F RND (DRAIN) IMPLANT
DRAPE LAPAROSCOPIC ABDOMINAL (DRAPES) ×2 IMPLANT
ELECT CAUTERY BLADE 6.4 (BLADE) ×2 IMPLANT
ELECT REM PT RETURN 9FT ADLT (ELECTROSURGICAL) ×2
ELECTRODE REM PT RTRN 9FT ADLT (ELECTROSURGICAL) ×1 IMPLANT
EVACUATOR SILICONE 100CC (DRAIN) IMPLANT
GLOVE BIO SURGEON STRL SZ8 (GLOVE) ×2 IMPLANT
GLOVE BIOGEL PI IND STRL 8 (GLOVE) ×1 IMPLANT
GLOVE BIOGEL PI INDICATOR 8 (GLOVE) ×1
GOWN STRL REUS W/ TWL LRG LVL3 (GOWN DISPOSABLE) ×1 IMPLANT
GOWN STRL REUS W/ TWL XL LVL3 (GOWN DISPOSABLE) ×1 IMPLANT
GOWN STRL REUS W/TWL LRG LVL3 (GOWN DISPOSABLE) ×1
GOWN STRL REUS W/TWL XL LVL3 (GOWN DISPOSABLE) ×1
KIT BASIN OR (CUSTOM PROCEDURE TRAY) ×2 IMPLANT
KIT ROOM TURNOVER OR (KITS) ×2 IMPLANT
LIQUID BAND (GAUZE/BANDAGES/DRESSINGS) ×2 IMPLANT
NEEDLE HYPO 25GX1X1/2 BEV (NEEDLE) IMPLANT
NS IRRIG 1000ML POUR BTL (IV SOLUTION) ×2 IMPLANT
PACK GENERAL/GYN (CUSTOM PROCEDURE TRAY) ×2 IMPLANT
PAD ARMBOARD 7.5X6 YLW CONV (MISCELLANEOUS) ×2 IMPLANT
STAPLER VISISTAT 35W (STAPLE) IMPLANT
SUT MON AB 4-0 PC3 18 (SUTURE) ×2 IMPLANT
SUT NOVA NAB DX-16 0-1 5-0 T12 (SUTURE) ×4 IMPLANT
SUT PDS AB 1 CTX 36 (SUTURE) IMPLANT
SUT VIC AB 3-0 SH 27 (SUTURE) ×1
SUT VIC AB 3-0 SH 27X BRD (SUTURE) ×1 IMPLANT
SYR CONTROL 10ML LL (SYRINGE) IMPLANT
TOWEL OR 17X24 6PK STRL BLUE (TOWEL DISPOSABLE) ×2 IMPLANT
TOWEL OR 17X26 10 PK STRL BLUE (TOWEL DISPOSABLE) ×2 IMPLANT
TRAY FOLEY CATH 16FRSI W/METER (SET/KITS/TRAYS/PACK) IMPLANT

## 2015-09-29 NOTE — Progress Notes (Signed)
[facility] Progress Note     Subjective: Denies increased pain overnight. Denies fever, chills, nausea, vomiting. Last meal was yesterday. Agrees to surgery this AM for hernia repair.  Objective: Vital signs in last 24 hours: Temp:  [98.2 F (36.8 C)-98.4 F (36.9 C)] 98.2 F (36.8 C) (08/17 0535) Pulse Rate:  [70-86] 70 (08/17 0535) Resp:  [16-18] 18 (08/17 0535) BP: (104-143)/(74-101) 123/81 (08/17 0535) SpO2:  [96 %-100 %] 98 % (08/17 0535) Weight:  [90.7 kg (200 lb)-90.9 kg (200 lb 6.4 oz)] 90.9 kg (200 lb 6.4 oz) (08/16 1837) Last BM Date: 09/28/15  Intake/Output from previous day: 08/16 0701 - 08/17 0700 In: 725.8 [P.O.:600; I.V.:125.8] Out: 575 [Urine:575] Intake/Output this shift: No intake/output data recorded.  PE: Gen:  Alert, NAD, pleasant Card:  RRR, no M/G/R  Pulm:  CTA, no W/R/R Abd: Soft, NT/ND, +BS, 2-3 cm incisional hernia just above umbilicus, no overlying erythema.  Lab Results:   Recent Labs  09/28/15 1255  WBC 9.5  HGB 13.2  HCT 40.5  PLT 256   BMET  Recent Labs  09/28/15 1255  NA 138  K 3.8  CL 104  CO2 26  GLUCOSE 99  BUN 10  CREATININE 1.34*  CALCIUM 9.9   PT/INR No results for input(s): LABPROT, INR in the last 72 hours. CMP     Component Value Date/Time   NA 138 09/28/2015 1255   K 3.8 09/28/2015 1255   CL 104 09/28/2015 1255   CO2 26 09/28/2015 1255   GLUCOSE 99 09/28/2015 1255   BUN 10 09/28/2015 1255   CREATININE 1.34 (H) 09/28/2015 1255   CALCIUM 9.9 09/28/2015 1255   PROT 6.7 09/28/2015 1255   ALBUMIN 3.8 09/28/2015 1255   AST 26 09/28/2015 1255   ALT 27 09/28/2015 1255   ALKPHOS 83 09/28/2015 1255   BILITOT 0.5 09/28/2015 1255   GFRNONAA >60 09/28/2015 1255   GFRAA >60 09/28/2015 1255   Lipase     Component Value Date/Time   LIPASE 60 (H) 09/28/2015 1255       Studies/Results: Ct Abdomen Pelvis W Contrast  Result Date: 09/28/2015 CLINICAL DATA:  Umbilical abdominal pain starting  this morning. Patient had appendectomy on 09/15/2015. EXAM: CT ABDOMEN AND PELVIS WITH CONTRAST TECHNIQUE: Multidetector CT imaging of the abdomen and pelvis was performed using the standard protocol following bolus administration of intravenous contrast. CONTRAST:  188mL ISOVUE-300 IOPAMIDOL (ISOVUE-300) INJECTION 61% COMPARISON:  09/15/2015 FINDINGS: Lower chest:  Compressive atelectasis posterior lung bases. Hepatobiliary: No focal abnormality within the liver parenchyma. Multiple folds noted in the gallbladder, otherwise unremarkable. No intrahepatic or extrahepatic biliary dilation. Pancreas: No focal mass lesion. No dilatation of the main duct. No intraparenchymal cyst. No peripancreatic edema. Spleen: No splenomegaly. No focal mass lesion. Adrenals/Urinary Tract: No adrenal nodule or mass. Kidneys are unremarkable. No evidence for hydroureter. The urinary bladder appears normal for the degree of distention. Stomach/Bowel: Stomach is nondistended. No gastric wall thickening. No evidence of outlet obstruction. Duodenum is normally positioned as is the ligament of Treitz. No small bowel wall thickening. No small bowel dilatation. The terminal ileum is normal. Nonvisualization of the appendix is consistent with the reported history of appendectomy. No gross colonic mass. No colonic wall thickening. No substantial diverticular change. Vascular/Lymphatic: There is abdominal aortic atherosclerosis without aneurysm. There is no gastrohepatic or hepatoduodenal ligament lymphadenopathy. No intraperitoneal or retroperitoneal lymphadenopathy. No pelvic sidewall lymphadenopathy. Reproductive: The prostate gland and seminal vesicles have normal imaging features. Other: No intraperitoneal  free fluid. No evidence for intraperitoneal abscess. No intraperitoneal free air. Musculoskeletal: On the previous study, the patient had a small supraumbilical midline ventral hernia about 2 cm cranial to the umbilicus. This persists on  today's study and there is some edema/ inflammation associated with the fat containing hernia sac that measures approximately 1.6 x 1.9 x 2.3 cm. There is no bowel contained in the hernia. There is no focal fluid collection in the anterior abdominal wall to suggest abscess. IMPRESSION: 1. No evidence for intraperitoneal free fluid or intraperitoneal abscess in this patient status post recent cholecystectomy. 2. Small supraumbilical midline ventral hernia contains only fat although there is edema/inflammation associated with the hernia sac. No abscess or fluid within the hernia. Electronically Signed   By: Misty Stanley M.D.   On: 09/28/2015 15:03   Anti-infectives: Anti-infectives    None     Assessment/Plan Incisional hernia  S/p laparoscopic appendectomy 09/15/15 Dr. Hulen Skains  HTN - continue home meds: HCTZ GERD - omeprazole at home; will restart post-op Tobacco abuse - Nicoderm  FEN: NPO DVT: SCD's Dispo: OR today for incisional hernia repair by Dr. Brantley Stage -Consent ordered - perioperative abx    LOS: 0 days    Jill Alexanders , [facility] Surgery 09/29/2015, 7:38 AM Pager: [phone] Consults: [phone] Mon-Fri 7:00 am-4:30 pm Sat-Sun 7:00 am-11:30 am

## 2015-09-29 NOTE — Anesthesia Procedure Notes (Addendum)
Procedure Name: Intubation Date/Time: 09/29/2015 10:44 AM Performed by: Garrison Columbus T Pre-anesthesia Checklist: Patient identified, Emergency Drugs available, Suction available and Patient being monitored Patient Re-evaluated:Patient Re-evaluated prior to inductionOxygen Delivery Method: Circle System Utilized Preoxygenation: Pre-oxygenation with 100% oxygen Intubation Type: IV induction and Rapid sequence Ventilation: Mask ventilation without difficulty Laryngoscope Size: Glidescope and 4 Grade View: Grade I Tube type: Oral Tube size: 7.5 mm Number of attempts: 1 Airway Equipment and Method: Stylet and Oral airway Placement Confirmation: ETT inserted through vocal cords under direct vision,  positive ETCO2 and breath sounds checked- equal and bilateral Secured at: 23 cm Tube secured with: Tape Dental Injury: Teeth and Oropharynx as per pre-operative assessment  Comments: Intubation by [address]

## 2015-09-29 NOTE — Progress Notes (Signed)
Spoke with dr germeroth/mda d/t pt's difficulty finding adequate pain relief and fact he is tense/grasping bedrails w/ periodic hyperventilation. Orders rec'd for ofirmev and midazolam.

## 2015-09-29 NOTE — Anesthesia Preprocedure Evaluation (Addendum)
Anesthesia Evaluation  Patient identified by MRN, date of birth, ID band Patient awake    Reviewed: Allergy & Precautions, NPO status , Patient's Chart, lab work & pertinent test results  Airway Mallampati: IV  TM Distance: >3 FB Neck ROM: Full    Dental  (+) Dental Advisory Given, Teeth Intact   Pulmonary Current Smoker,    breath sounds clear to auscultation       Cardiovascular hypertension,  Rhythm:Regular     Neuro/Psych PSYCHIATRIC DISORDERS Anxiety Depression    GI/Hepatic GERD  ,  Endo/Other    Renal/GU      Musculoskeletal  (+) Arthritis ,   Abdominal   Peds  Hematology   Anesthesia Other Findings   Reproductive/Obstetrics                           Anesthesia Physical Anesthesia Plan  ASA: II  Anesthesia Plan: General   Post-op Pain Management:    Induction: Intravenous  Airway Management Planned: Oral ETT and Video Laryngoscope Planned  Additional Equipment:   Intra-op Plan:   Post-operative Plan: Extubation in OR  Informed Consent: I have reviewed the patients History and Physical, chart, labs and discussed the procedure including the risks, benefits and alternatives for the proposed anesthesia with the patient or authorized representative who has indicated his/her understanding and acceptance.   Dental advisory given  Plan Discussed with: CRNA, Anesthesiologist and Surgeon  Anesthesia Plan Comments:        Anesthesia Quick Evaluation

## 2015-09-29 NOTE — Op Note (Signed)
Preoperative diagnosis: Incisional hernia incarcerated  Postoperative diagnosis: Same  Procedure: Repair of incarcerated incisional hernia  Surgeon: Erroll Luna M.D.  Anesthesia: Gen. with 0.25% Sensorcaine local  EBL: Minimal  Specimens: None  Indications for procedure: The patient is 2 weeks out from laparoscopic cholecystectomy by Dr. Hulen Skains. He was doing some lifting yesterday and felt a pop was incision. He came the emergency room where examination CT scan showed a small incisional hernia at his previous umbilical port site. He was admitted. He was evaluated and felt that surgical closure this was necessary. We discussed the risk of the procedure with him. Risk of bleeding, infection, recurrence, organ injury, bowel injury, bowel obstruction, and the need for other operative procedures anal treatment were discussed with the patient. Risk of death, DVT, multisystem organ failure, and other possibilities discuss either these are low risk.  He proceed.  Description of procedure: The patient was met in the holding area and questions are answered. He was taken back to the operating room and placed upon the OR table. After induction of general anesthesia, the abdomen was prepped and draped in sterile fashion. Timeout was done he received preoperative antibiotics. The old laparoscopic incision the umbilicus with the hernia was located was open. There is some necrotic appearing fat. I found a very small dehiscence at the closure site that contained preperitoneal fat. I opened the fascia just above this placed my finger into the abdominal cavity and swept around. Most what was seen there on the CT scan was necrotic fat which I debrided. The fascia at this point in time after examination had a 5 mm defect. I then closed the fascia then opened under direct vision with #1 Novafil suture. This closed down the fascia and the defect was closed. Given the amount of necrotic fat I debrided I did not wish to  place mesh into this wound. The defect was only about 5 mm maximal diameter. Preperitoneal fat was incarcerated in this. We was irrigated. He was closed with a 3-0 Vicryl and 4-0 Monocryl. A liquid adhesive applied. All final counts found to be correct. Patient awoke extubated taken recovery in satisfactory condition.

## 2015-09-29 NOTE — Transfer of Care (Signed)
Immediate Anesthesia Transfer of Care Note  Patient: Timothy Mccarthy  Procedure(s) Performed: Procedure(s): HERNIA REPAIR INCISIONAL (N/A)  Patient Location: PACU  Anesthesia Type:General  Level of Consciousness: awake, alert  and oriented  Airway & Oxygen Therapy: Patient Spontanous Breathing and Patient connected to nasal cannula oxygen  Post-op Assessment: Report given to RN, Post -op Vital signs reviewed and stable and Patient moving all extremities X 4  Post vital signs: Reviewed and stable  Last Vitals:  Vitals:   09/29/15 0535 09/29/15 0931  BP: 123/81 129/90  Pulse: 70 78  Resp: 18 18  Temp: 36.8 C 36.7 C    Last Pain:  Vitals:   09/29/15 0931  TempSrc: Oral  PainSc:          Complications: No apparent anesthesia complications

## 2015-09-30 ENCOUNTER — Encounter (HOSPITAL_COMMUNITY): Payer: Self-pay | Admitting: Surgery

## 2015-09-30 MED ORDER — POLYETHYLENE GLYCOL 3350 17 G PO PACK: 17.0000 g | PACK | Freq: Two times a day (BID) | ORAL | 0 refills | Status: DC

## 2015-09-30 MED ORDER — SIMETHICONE 80 MG PO CHEW: 80.0000 mg | CHEWABLE_TABLET | Freq: Four times a day (QID) | ORAL | 0 refills | Status: DC

## 2015-09-30 MED ORDER — ONDANSETRON 4 MG PO TBDP: 4.0000 mg | ORAL_TABLET | Freq: Four times a day (QID) | ORAL | 0 refills | Status: DC | PRN

## 2015-09-30 MED ORDER — SIMETHICONE 80 MG PO CHEW
80.0000 mg | CHEWABLE_TABLET | Freq: Four times a day (QID) | ORAL | Status: DC
  Administered 2015-09-30: 80 mg via ORAL
  Filled 2015-09-30: qty 1

## 2015-09-30 MED ORDER — OXYCODONE-ACETAMINOPHEN 5-325 MG PO TABS: 1.0000 | ORAL_TABLET | ORAL | 0 refills | Status: DC | PRN

## 2015-09-30 MED ORDER — POLYETHYLENE GLYCOL 3350 17 G PO PACK
17.0000 g | PACK | Freq: Every day | ORAL | Status: DC
  Administered 2015-09-30: 17 g via ORAL
  Filled 2015-09-30: qty 1

## 2015-09-30 NOTE — Progress Notes (Signed)
Patient ID: Timothy Mccarthy, male   DOB: 09/19/1970, 45 y.o.   MRN: [id number]  [facility] Progress Note  1 Day Post-Op  Subjective: Feeling well this morning. Sore but pain is controlled. Currently eating breakfast. States that he has an appetite but he has not passed any gas or BM since surgery.  Objective: Vital signs in last 24 hours: Temp:  [97.9 F (36.6 C)-99.1 F (37.3 C)] 99.1 F (37.3 C) (08/18 0627) Pulse Rate:  [78-95] 84 (08/18 0848) Resp:  [4-24] 18 (08/18 0848) BP: (129-154)/(78-118) 141/86 (08/18 0848) SpO2:  [96 %-100 %] 99 % (08/18 0848) Last BM Date: 09/28/15  Intake/Output from previous day: 08/17 0701 - 08/18 0700 In: 4731.7 [P.O.:1560; I.V.:3171.7] Out: 1260 [Urine:1250; Blood:10] Intake/Output this shift: Total I/O In: -  Out: 400 [Urine:400]  PE: Gen:  Alert, NAD, pleasant Card:  RRR Pulm:  CTAB Abd: Soft, ND, appropriately tender. Hypoactive Bs. Incisions C/D/I  Lab Results:   Recent Labs  09/28/15 1255  WBC 9.5  HGB 13.2  HCT 40.5  PLT 256   BMET  Recent Labs  09/28/15 1255  NA 138  K 3.8  CL 104  CO2 26  GLUCOSE 99  BUN 10  CREATININE 1.34*  CALCIUM 9.9   PT/INR No results for input(s): LABPROT, INR in the last 72 hours. CMP     Component Value Date/Time   NA 138 09/28/2015 1255   K 3.8 09/28/2015 1255   CL 104 09/28/2015 1255   CO2 26 09/28/2015 1255   GLUCOSE 99 09/28/2015 1255   BUN 10 09/28/2015 1255   CREATININE 1.34 (H) 09/28/2015 1255   CALCIUM 9.9 09/28/2015 1255   PROT 6.7 09/28/2015 1255   ALBUMIN 3.8 09/28/2015 1255   AST 26 09/28/2015 1255   ALT 27 09/28/2015 1255   ALKPHOS 83 09/28/2015 1255   BILITOT 0.5 09/28/2015 1255   GFRNONAA >60 09/28/2015 1255   GFRAA >60 09/28/2015 1255   Lipase     Component Value Date/Time   LIPASE 60 (H) 09/28/2015 1255       Studies/Results: Ct Abdomen Pelvis W Contrast  Result Date: 09/28/2015 CLINICAL DATA:  Umbilical abdominal pain starting  this morning. Patient had appendectomy on 09/15/2015. EXAM: CT ABDOMEN AND PELVIS WITH CONTRAST TECHNIQUE: Multidetector CT imaging of the abdomen and pelvis was performed using the standard protocol following bolus administration of intravenous contrast. CONTRAST:  197mL ISOVUE-300 IOPAMIDOL (ISOVUE-300) INJECTION 61% COMPARISON:  09/15/2015 FINDINGS: Lower chest:  Compressive atelectasis posterior lung bases. Hepatobiliary: No focal abnormality within the liver parenchyma. Multiple folds noted in the gallbladder, otherwise unremarkable. No intrahepatic or extrahepatic biliary dilation. Pancreas: No focal mass lesion. No dilatation of the main duct. No intraparenchymal cyst. No peripancreatic edema. Spleen: No splenomegaly. No focal mass lesion. Adrenals/Urinary Tract: No adrenal nodule or mass. Kidneys are unremarkable. No evidence for hydroureter. The urinary bladder appears normal for the degree of distention. Stomach/Bowel: Stomach is nondistended. No gastric wall thickening. No evidence of outlet obstruction. Duodenum is normally positioned as is the ligament of Treitz. No small bowel wall thickening. No small bowel dilatation. The terminal ileum is normal. Nonvisualization of the appendix is consistent with the reported history of appendectomy. No gross colonic mass. No colonic wall thickening. No substantial diverticular change. Vascular/Lymphatic: There is abdominal aortic atherosclerosis without aneurysm. There is no gastrohepatic or hepatoduodenal ligament lymphadenopathy. No intraperitoneal or retroperitoneal lymphadenopathy. No pelvic sidewall lymphadenopathy. Reproductive: The prostate gland and seminal vesicles have normal imaging features. Other:  No intraperitoneal free fluid. No evidence for intraperitoneal abscess. No intraperitoneal free air. Musculoskeletal: On the previous study, the patient had a small supraumbilical midline ventral hernia about 2 cm cranial to the umbilicus. This persists on  today's study and there is some edema/ inflammation associated with the fat containing hernia sac that measures approximately 1.6 x 1.9 x 2.3 cm. There is no bowel contained in the hernia. There is no focal fluid collection in the anterior abdominal wall to suggest abscess. IMPRESSION: 1. No evidence for intraperitoneal free fluid or intraperitoneal abscess in this patient status post recent cholecystectomy. 2. Small supraumbilical midline ventral hernia contains only fat although there is edema/inflammation associated with the hernia sac. No abscess or fluid within the hernia. Electronically Signed   By: Misty Stanley M.D.   On: 09/28/2015 15:03    Anti-infectives: Anti-infectives    Start     Dose/Rate Route Frequency Ordered Stop   09/29/15 1000  clindamycin (CLEOCIN) IVPB 900 mg     900 mg 100 mL/hr over 30 Minutes Intravenous To [facility] 09/29/15 0754 09/29/15 1059       Assessment/Plan Incisional hernia incarcerated S/p Repair of incarcerated incisional hernia 09/29/15 - pain controlled, add Miralax  VTE - SCD's., ambulate FEN - soft diet Dispo - pain, return of bowel function. Hope to go home today.   LOS: 0 days    Jerrye Beavers , [facility] Surgery 09/30/2015, 8:57 AM Pager: [phone] Consults: [phone] Mon-Fri 7:00 am-4:30 pm Sat-Sun 7:00 am-11:30 am

## 2015-09-30 NOTE — Discharge Summary (Signed)
[facility] Discharge Summary   Patient ID: Timothy Mccarthy MRN: [id number] DOB/AGE: July 14, 1970 45 y.o.  Admit date: 09/28/2015 Discharge date: 09/30/2015  Admitting Diagnosis: Incisional hernia  Discharge Diagnosis Patient Active Problem List   Diagnosis Date Noted  . Abdominal hernia 09/28/2015  . Appendicitis 09/15/2015  . Tobacco use disorder 04/21/2015  . Adjustment disorder with anxious mood 04/21/2015  . Health care maintenance 04/21/2015  . GERD (gastroesophageal reflux disease) 04/21/2015    Consultants None  Imaging: CT abdomen pelvis with contrast 09/28/15: 1. No evidence for intraperitoneal free fluid or intraperitoneal abscess in this patient status post recent cholecystectomy. 2. Small supraumbilical midline ventral hernia contains only fat although there is edema/inflammation associated with the hernia sac. No abscess or fluid within the hernia.  Procedures Dr. Brantley Stage (09/29/15) - Repair of incarcerated incisional hernia   Hospital Course:  Timothy Mccarthy is a 45yo male who presented [facility] 09/29/15 with an abdominal bulge associated with pain and nausea/vomiting that occurred the same day after standing up from a kneeling position.  Workup showed a supraumbilical midline ventral incisional hernia.  Patient was admitted and underwent procedure listed above.  Tolerated procedure well and was transferred to the floor.  Diet was advanced as tolerated.  On POD1 the patient was felt stable for discharge home.  Patient will follow up in our office in 2 weeks.  Physical Exam: Gen:  Alert, NAD, pleasant Card:  RRR Pulm:  CTAB Abd: Soft, ND, appropriately tender. Hypoactive Bs. Incisions C/D/I    Medication List    STOP taking these medications   Oxycodone HCl 10 MG Tabs     TAKE these medications   acetaminophen 325 MG tablet Commonly known as:  TYLENOL Take 2 tablets (650 mg total) by mouth every 6 (six) hours as needed for mild pain (or temp  > 100).   calcium carbonate 500 MG chewable tablet Commonly known as:  TUMS - dosed in mg elemental calcium Chew 2 tablets by mouth daily.   hydrochlorothiazide 25 MG tablet Commonly known as:  HYDRODIURIL Take 25 mg by mouth daily.   mirtazapine 15 MG tablet Commonly known as:  REMERON Take 15 mg by mouth at bedtime.   nicotine 14 mg/24hr patch Commonly known as:  NICODERM CQ - dosed in mg/24 hours Place 1 patch (14 mg total) onto the skin daily.   nicotine polacrilex 4 MG gum Commonly known as:  NICORETTE Take 1 each (4 mg total) by mouth as needed for smoking cessation.   omeprazole 40 MG capsule Commonly known as:  PRILOSEC Take 1 capsule by mouth daily.   ondansetron 4 MG disintegrating tablet Commonly known as:  ZOFRAN-ODT Take 1 tablet (4 mg total) by mouth every 6 (six) hours as needed for nausea.   ONE-A-DAY MENS PO Take 2 tablets by mouth daily.   oxyCODONE-acetaminophen 5-325 MG tablet Commonly known as:  PERCOCET/ROXICET Take 1 tablet by mouth every 4 (four) hours as needed for moderate pain.   polyethylene glycol packet Commonly known as:  MIRALAX / GLYCOLAX Take 17 g by mouth 2 (two) times daily.   simethicone 80 MG chewable tablet Commonly known as:  MYLICON Chew 1 tablet (80 mg total) by mouth 4 (four) times daily.        Follow-up Information    [facility] .   Why:  10/24/15 at 2:10pm (please arrive about 30 minutes early) Contact information: [address] [place] [phone]  Signed: Jerrye Beavers, [facility] 09/30/2015, 4:16 PM Pager: [phone] Consults: [phone] Mon-Fri 7:00 am-4:30 pm Sat-Sun 7:00 am-11:30 am

## 2015-10-01 NOTE — Anesthesia Postprocedure Evaluation (Signed)
Anesthesia Post Note  Patient: Timothy Mccarthy  Procedure(s) Performed: Procedure(s) (LRB): HERNIA REPAIR INCISIONAL (N/A)  Patient location during evaluation: PACU Anesthesia Type: General Level of consciousness: sedated and patient cooperative Pain management: pain level controlled Vital Signs Assessment: post-procedure vital signs reviewed and stable Respiratory status: spontaneous breathing Cardiovascular status: stable Anesthetic complications: no    Last Vitals:  Vitals:   09/30/15 0848 09/30/15 1417  BP: (!) 141/86 (!) 145/90  Pulse: 84 84  Resp: 18 18  Temp:  37.1 C    Last Pain:  Vitals:   09/30/15 1417  TempSrc: Oral  PainSc:                  [name]

## 2015-11-02 ENCOUNTER — Encounter (HOSPITAL_COMMUNITY): Payer: Self-pay

## 2015-11-02 ENCOUNTER — Emergency Department (HOSPITAL_COMMUNITY)
Admission: EM | Admit: 2015-11-02 | Discharge: 2015-11-02 | Disposition: A | Discharge status: Home / Self Care | Payer: BLUE CROSS/BLUE SHIELD | Attending: Emergency Medicine | Admitting: Emergency Medicine

## 2015-11-02 DIAGNOSIS — F17210 Nicotine dependence, cigarettes, uncomplicated: Secondary | ICD-10-CM | POA: Diagnosis not present

## 2015-11-02 DIAGNOSIS — Z79899 Other long term (current) drug therapy: Secondary | ICD-10-CM | POA: Diagnosis not present

## 2015-11-02 DIAGNOSIS — I10 Essential (primary) hypertension: Secondary | ICD-10-CM | POA: Diagnosis not present

## 2015-11-02 DIAGNOSIS — R1012 Left upper quadrant pain: Secondary | ICD-10-CM | POA: Insufficient documentation

## 2015-11-02 LAB — CBC
HCT: 42.3 % (ref 39.0–52.0)
Hemoglobin: 13.6 g/dL (ref 13.0–17.0)
MCH: 27 pg (ref 26.0–34.0)
MCHC: 32.2 g/dL (ref 30.0–36.0)
MCV: 84.1 fL (ref 78.0–100.0)
Platelets: 227 10*3/uL (ref 150–400)
RBC: 5.03 MIL/uL (ref 4.22–5.81)
RDW: 14.5 % (ref 11.5–15.5)
WBC: 8.1 10*3/uL (ref 4.0–10.5)

## 2015-11-02 LAB — URINALYSIS, ROUTINE W REFLEX MICROSCOPIC
Bilirubin Urine: NEGATIVE
Glucose, UA: NEGATIVE mg/dL
Hgb urine dipstick: NEGATIVE
Ketones, ur: NEGATIVE mg/dL
Leukocytes, UA: NEGATIVE
Nitrite: NEGATIVE
Protein, ur: NEGATIVE mg/dL
Specific Gravity, Urine: 1.019 (ref 1.005–1.030)
pH: 7.5 (ref 5.0–8.0)

## 2015-11-02 LAB — COMPREHENSIVE METABOLIC PANEL
ALT: 28 U/L (ref 17–63)
AST: 23 U/L (ref 15–41)
Albumin: 3.8 g/dL (ref 3.5–5.0)
Alkaline Phosphatase: 84 U/L (ref 38–126)
Anion gap: 9 (ref 5–15)
BUN: 8 mg/dL (ref 6–20)
CO2: 28 mmol/L (ref 22–32)
Calcium: 9.5 mg/dL (ref 8.9–10.3)
Chloride: 99 mmol/L — ABNORMAL LOW (ref 101–111)
Creatinine, Ser: 1.18 mg/dL (ref 0.61–1.24)
GFR calc Af Amer: >60 mL/min (ref >60)
GFR calc non Af Amer: >60 mL/min (ref >60)
Glucose, Bld: 119 mg/dL — ABNORMAL HIGH (ref 65–99)
Potassium: 3.1 mmol/L — ABNORMAL LOW (ref 3.5–5.1)
Sodium: 136 mmol/L (ref 135–145)
Total Bilirubin: 0.5 mg/dL (ref 0.3–1.2)
Total Protein: 6.7 g/dL (ref 6.5–8.1)

## 2015-11-02 LAB — LIPASE, BLOOD: Lipase: 31 U/L (ref 11–51)

## 2015-11-02 MED ORDER — PANTOPRAZOLE SODIUM 20 MG PO TBEC: 20.0000 mg | DELAYED_RELEASE_TABLET | Freq: Every day | ORAL | 0 refills | Status: DC

## 2015-11-02 MED ORDER — POTASSIUM CHLORIDE CRYS ER 20 MEQ PO TBCR
40.0000 meq | EXTENDED_RELEASE_TABLET | Freq: Once | ORAL | Status: AC
  Administered 2015-11-02: 40 meq via ORAL
  Filled 2015-11-02: qty 2

## 2015-11-02 NOTE — ED Notes (Signed)
Pt unable to void at this time. 

## 2015-11-02 NOTE — ED Triage Notes (Signed)
Pt reports he began having abd pain yesterday after eating pork. He reports the pain is back in the left flank area. Pt describes as a cramping sensation. Pt reports one episode of emesis.

## 2015-11-02 NOTE — Discharge Instructions (Signed)
Please read and follow all provided instructions.  Your diagnoses today include:  1. LUQ pain     Tests performed today include: Blood counts and electrolytes Blood tests to check liver and kidney function Blood tests to check pancreas function Urine test to look for infection and pregnancy (in women) Vital signs. See below for your results today.   Medications prescribed:   Take any prescribed medications only as directed. Stop taking Prilosec and take this medication.  Home care instructions:  Follow any educational materials contained in this packet.  Follow-up instructions: Please follow-up with your primary care provider in the next 2 days for further evaluation of your symptoms.    Return instructions:  SEEK IMMEDIATE MEDICAL ATTENTION IF: The pain does not go away or becomes severe  A temperature above 101F develops  Repeated vomiting occurs (multiple episodes)  The pain becomes localized to portions of the abdomen. The right side could possibly be appendicitis. In an adult, the left lower portion of the abdomen could be colitis or diverticulitis.  Blood is being passed in stools or vomit (bright red or black tarry stools)  You develop chest pain, difficulty breathing, dizziness or fainting, or become confused, poorly responsive, or inconsolable (young children) If you have any other emergent concerns regarding your health  Additional Information: Abdominal (belly) pain can be caused by many things. Your caregiver performed an examination and possibly ordered blood/urine tests and imaging (CT scan, x-rays, ultrasound). Many cases can be observed and treated at home after initial evaluation in the emergency department. Even though you are being discharged home, abdominal pain can be unpredictable. Therefore, you need a repeated exam if your pain does not resolve, returns, or worsens. Most patients with abdominal pain don't have to be admitted to the hospital or have surgery,  but serious problems like appendicitis and gallbladder attacks can start out as nonspecific pain. Many abdominal conditions cannot be diagnosed in one visit, so follow-up evaluations are very important.  Your vital signs today were: BP 136/95 (BP Location: Left Arm)    Pulse 73    Temp 98.1 F (36.7 C) (Oral)    Resp 18    Wt 94.3 kg    SpO2 98%    BMI 32.58 kg/m  If your blood pressure (bp) was elevated above 135/85 this visit, please have this repeated by your doctor within one month. --------------

## 2015-11-02 NOTE — ED Provider Notes (Signed)
[facility] DEPT Provider Note   CSN: [id number] Arrival date & time: 11/02/15  1615  History   Chief Complaint Chief Complaint  Patient presents with  . Flank Pain    HPI Timothy Mccarthy is a 45 y.o. male.  HPI  45 y.o. male with a hx of HTN, GERD, presents to the Emergency Department today complaining of LUQ pain/Left Flank with onset yesterday. Pt states that pain occurred s/p eating pork chops that he states were a little spicy yesterday. Has hx reflux that he takes Prilosec. Has hx GERD. Today ate ramen and had symptoms similar to night before. No CP/SOB. Notes main area of pain was LUQ and left flank. Noted 1 episode of emesis after PO ingestion. After several hours the pain resolved. Pt without pain currently. No urinary symptoms. No diarrhea. No other symptoms noted.   Past Medical History:  Diagnosis Date  . Adjustment disorder with anxious mood   . Anxiety   . Arthritis    "hands" (09/28/2015)  . Depression   . GERD (gastroesophageal reflux disease)   . Hypertension   . Tobacco use disorder     Patient Active Problem List   Diagnosis Date Noted  . Abdominal hernia 09/28/2015  . Appendicitis 09/15/2015  . Tobacco use disorder 04/21/2015  . Adjustment disorder with anxious mood 04/21/2015  . Health care maintenance 04/21/2015  . GERD (gastroesophageal reflux disease) 04/21/2015    Past Surgical History:  Procedure Laterality Date  . APPENDECTOMY  09/15/2015  . CARPAL TUNNEL RELEASE Bilateral   . INCISIONAL HERNIA REPAIR N/A 09/29/2015   Procedure: HERNIA REPAIR INCISIONAL;  Surgeon: Erroll Luna, MD;  Location: [facility];  Service: General;  Laterality: N/A;  . LAPAROSCOPIC APPENDECTOMY N/A 09/15/2015   Procedure: APPENDECTOMY LAPAROSCOPIC;  Surgeon: Judeth Horn, MD;  Location: [facility];  Service: General;  Laterality: N/A;  . LUMPS ON BACK  2013  . WISDOM TOOTH EXTRACTION         Home Medications    Prior to Admission medications   Medication Sig Start Date  End Date Taking? Authorizing Provider  acetaminophen (TYLENOL) 325 MG tablet Take 2 tablets (650 mg total) by mouth every 6 (six) hours as needed for mild pain (or temp > 100). 09/16/15   Earnstine Regal, PA-C  calcium carbonate (TUMS - DOSED IN MG ELEMENTAL CALCIUM) 500 MG chewable tablet Chew 2 tablets by mouth daily.    Historical Provider, MD  hydrochlorothiazide (HYDRODIURIL) 25 MG tablet Take 25 mg by mouth daily.    Historical Provider, MD  mirtazapine (REMERON) 15 MG tablet Take 15 mg by mouth at bedtime.    Historical Provider, MD  Multiple Vitamin (ONE-A-DAY MENS PO) Take 2 tablets by mouth daily.    Historical Provider, MD  nicotine (NICODERM CQ - DOSED IN MG/24 HOURS) 14 mg/24hr patch Place 1 patch (14 mg total) onto the skin daily. Patient not taking: Reported on 09/14/2015 04/21/15   Loleta Chance, MD  nicotine polacrilex (NICORETTE) 4 MG gum Take 1 each (4 mg total) by mouth as needed for smoking cessation. Patient not taking: Reported on 09/14/2015 04/21/15   Loleta Chance, MD  omeprazole (PRILOSEC) 40 MG capsule Take 1 capsule by mouth daily.    Historical Provider, MD  ondansetron (ZOFRAN-ODT) 4 MG disintegrating tablet Take 1 tablet (4 mg total) by mouth every 6 (six) hours as needed for nausea. 09/30/15   Jerrye Beavers, PA-C  oxyCODONE-acetaminophen (PERCOCET/ROXICET) 5-325 MG tablet Take 1 tablet by mouth every 4 (  four) hours as needed for moderate pain. 09/30/15   Jerrye Beavers, PA-C  polyethylene glycol (MIRALAX / GLYCOLAX) packet Take 17 g by mouth 2 (two) times daily. 09/30/15   Jerrye Beavers, PA-C  simethicone (MYLICON) 80 MG chewable tablet Chew 1 tablet (80 mg total) by mouth 4 (four) times daily. 09/30/15   Jerrye Beavers, PA-C    Family History Family History  Problem Relation Age of Onset  . Stroke Mother   . Diabetes type II Mother     Social History Social History  Substance Use Topics  . Smoking status: Current Every Day Smoker    Packs/day: 0.50    Years: 23.00      Types: Cigarettes    Start date: 04/21/1987  . Smokeless tobacco: Never Used  . Alcohol use 3.6 oz/week    6 Cans of beer per week     Comment: "drink only on the weekends"     Allergies   Penicillins   Review of Systems Review of Systems ROS reviewed and all are negative for acute change except as noted in the HPI.  Physical Exam Updated Vital Signs BP 136/95 (BP Location: Left Arm)   Pulse 73   Temp 98.1 F (36.7 C) (Oral)   Resp 18   Wt 94.3 kg   SpO2 98%   BMI 32.58 kg/m   Physical Exam  Constitutional: He is oriented to person, place, and time. Vital signs are normal. He appears well-developed and well-nourished.  HENT:  Head: Normocephalic.  Right Ear: Hearing normal.  Left Ear: Hearing normal.  Eyes: Conjunctivae and EOM are normal. Pupils are equal, round, and reactive to light.  Neck: Normal range of motion. Neck supple.  Cardiovascular: Normal rate, regular rhythm, normal heart sounds and intact distal pulses.   Pulmonary/Chest: Effort normal and breath sounds normal.  Abdominal: Soft. Normal appearance. There is no tenderness. There is no rigidity, no rebound, no guarding, no CVA tenderness, no tenderness at McBurney's point and negative Murphy's sign.  Neurological: He is alert and oriented to person, place, and time.  Skin: Skin is warm and dry.  Psychiatric: He has a normal mood and affect. His speech is normal and behavior is normal. Thought content normal.  Nursing note and vitals reviewed.  ED Treatments / Results  Labs (all labs ordered are listed, but only abnormal results are displayed) Labs Reviewed  COMPREHENSIVE METABOLIC PANEL - Abnormal; Notable for the following:       Result Value   Potassium 3.1 (*)    Chloride 99 (*)    Glucose, Bld 119 (*)    All other components within normal limits  LIPASE, BLOOD  CBC  URINALYSIS, ROUTINE W REFLEX MICROSCOPIC (NOT AT [facility])   EKG  EKG Interpretation None      Radiology No results  found.  Procedures Procedures (including critical care time)  Medications Ordered in ED Medications - No data to display   Initial Impression / Assessment and Plan / ED Course  I have reviewed the triage vital signs and the nursing notes.  Pertinent labs & imaging results that were available during my care of the patient were reviewed by me and considered in my medical decision making (see chart for details).  Clinical Course    Final Clinical Impressions(s) / ED Diagnoses  I have reviewed and evaluated the relevant laboratory values I have reviewed the relevant previous healthcare records. I obtained HPI from historian.  ED Course:  Assessment: Patient is  a 45yM presents with LUQ/Left Flank pain since yesterday worsened by PO intake of spicy pork and ramen. No symptoms currently On exam, nontoxic, nonseptic appearing, in no apparent distress. Patient's pain and other symptoms adequately managed in emergency department.  Labs  and vitals reviewed.  Patient does not meet the SIRS or Sepsis criteria.  On repeat exam patient does not have a surgical abdomen and there are no peritoneal signs.  No indication of appendicitis, bowel obstruction, bowel perforation, cholecystitis, diverticulitis. Likely reflux symptoms based on HPI and asymptomatic currently. Labs unremarkable with UA showing no infection or Hgb. Potassium was slighty decreased. Given Kdur in ED. Patient discharged home with symptomatic treatment and given strict instructions for follow-up with their primary care physician.  I have also discussed reasons to return immediately to the ER.  Patient expresses understanding and agrees with plan.  Disposition/Plan:  DC Home Additional Verbal discharge instructions given and discussed with patient.  Pt Instructed to f/u with PCP in the next week for evaluation and treatment of symptoms. Return precautions given Pt acknowledges and agrees with plan  Supervising Physician Varney Biles, MD   Final diagnoses:  LUQ pain    New Prescriptions New Prescriptions   No medications on file     Shary Decamp, PA-C 11/02/15 [place], MD 11/03/15 [id number]

## 2015-11-02 NOTE — ED Notes (Signed)
Pt stepped out to stretch legs

## 2015-11-04 ENCOUNTER — Ambulatory Visit (INDEPENDENT_AMBULATORY_CARE_PROVIDER_SITE_OTHER): Payer: BLUE CROSS/BLUE SHIELD | Admitting: Internal Medicine

## 2015-11-04 VITALS — BP 126/79 | HR 97 | Temp 98.8°F | Ht 66.9 in | Wt 206.6 lb

## 2015-11-04 DIAGNOSIS — K219 Gastro-esophageal reflux disease without esophagitis: Secondary | ICD-10-CM | POA: Diagnosis not present

## 2015-11-04 DIAGNOSIS — I10 Essential (primary) hypertension: Secondary | ICD-10-CM

## 2015-11-04 DIAGNOSIS — F17210 Nicotine dependence, cigarettes, uncomplicated: Secondary | ICD-10-CM | POA: Diagnosis not present

## 2015-11-04 DIAGNOSIS — F17200 Nicotine dependence, unspecified, uncomplicated: Secondary | ICD-10-CM

## 2015-11-04 DIAGNOSIS — I152 Hypertension secondary to endocrine disorders: Secondary | ICD-10-CM | POA: Insufficient documentation

## 2015-11-04 DIAGNOSIS — Z79899 Other long term (current) drug therapy: Secondary | ICD-10-CM

## 2015-11-04 NOTE — Assessment & Plan Note (Addendum)
Recent ED visit on 9/20 for flank pain following a spice/salty meal. Workup negative and ultimately attributed to GERD, provided Rx for PPI (unsure if omeprazole or pantoprazole). Quit smoking yesterday.  Plan: - Continue PPI daily - Encourage dietary changes and continued tobacco cessation

## 2015-11-04 NOTE — Patient Instructions (Addendum)
Please continue to take your medications as prescribed to keep your blood pressure under control.  Our schedulers will call you in a few days to let you know the new PCP that you are assigned to.  Congratulations on taking the first steps toward smoking cessation, keep up the good work!!!

## 2015-11-04 NOTE — Progress Notes (Signed)
   CC: Establish care  HPI:  [name] is a 45 y.o. male with PMHx detailed below presenting with no acute complaints but trying to establish care and begin seeing a new PCP at this clinic. He states that his previous PCP is based in [place] and he is tired of the commute. He was recently seen in the ED on 9/20 for flank pain that was ultimately attributed to GERD and has resolved with PPI/diet modification.  See problem based assessment and plan below for additional details.  Past Medical History:  Diagnosis Date  . Adjustment disorder with anxious mood   . Anxiety   . Arthritis    "hands" (09/28/2015)  . Depression   . GERD (gastroesophageal reflux disease)   . Hypertension   . Tobacco use disorder    Review of Systems: ROS   Physical Exam: Vitals:   11/04/15 1521  BP: 126/79  Pulse: 97  Temp: 98.8 F (37.1 C)  TempSrc: Oral  SpO2: 99%  Weight: 206 lb 9.6 oz (93.7 kg)  Height: 5' 6.9" (1.699 m)   Body mass index is 32.45 kg/m. GENERAL- Well-dressed gentleman sitting comfortably in exam room chair, alert, in no distress HEENT- Atraumatic, moist mucous membranes CARDIAC- Regular rate and rhythm, no murmurs, rubs or gallops. RESP- Clear to ascultation bilaterally, no wheezing or crackles, normal work of breathing ABDOMEN- Normoactive bowel sounds, soft, nontender, nondistended EXTREMITIES- Normal bulk and range of motion, no edema, 2+ peripheral pulses SKIN- Warm, dry, intact, without visible rash PSYCH- Appropriate affect, clear speech, thoughts linear and goal-directed  Assessment & Plan:   See encounters tab for problem based medical decision making.  Patient seen with Dr. Angelia Mould

## 2015-11-04 NOTE — Assessment & Plan Note (Signed)
BP 126/79 today, well controlled on HCTZ 25mg . Recalls recently eating salty pork meal and having severe headache on a day when he forgot to take his medication.  Plan: - Continue HCTZ 25mg  daily - Encourage low sodium diet, exercise, weight loss

## 2015-11-04 NOTE — Assessment & Plan Note (Signed)
Has smoked 1/2 ppd on and off for approximately 25 total years. Decided to quit smoking yesterday and acquired NRT (gum/patches). Discussed and encouraged this change.  Plan: - Encourage continued tobacco cessation - Currently using NRT, if relapse consider adding Chantix or Wellbutrin with re-attempt

## 2015-11-07 NOTE — Progress Notes (Signed)
Internal Medicine Clinic Attending  I saw and evaluated the patient.  I personally confirmed the key portions of the history and exam documented by Dr. Johnson and I reviewed pertinent patient test results.  The assessment, diagnosis, and plan were formulated together and I agree with the documentation in the resident's note.  

## 2016-01-23 ENCOUNTER — Encounter (HOSPITAL_COMMUNITY): Payer: Self-pay | Admitting: Emergency Medicine

## 2016-01-23 ENCOUNTER — Emergency Department (HOSPITAL_COMMUNITY): Payer: BLUE CROSS/BLUE SHIELD

## 2016-01-23 ENCOUNTER — Emergency Department (HOSPITAL_COMMUNITY)
Admission: EM | Admit: 2016-01-23 | Discharge: 2016-01-24 | Disposition: A | Discharge status: Home / Self Care | Payer: BLUE CROSS/BLUE SHIELD | Attending: Emergency Medicine | Admitting: Emergency Medicine

## 2016-01-23 DIAGNOSIS — R079 Chest pain, unspecified: Secondary | ICD-10-CM | POA: Insufficient documentation

## 2016-01-23 DIAGNOSIS — I10 Essential (primary) hypertension: Secondary | ICD-10-CM | POA: Diagnosis not present

## 2016-01-23 DIAGNOSIS — F419 Anxiety disorder, unspecified: Secondary | ICD-10-CM

## 2016-01-23 DIAGNOSIS — F17210 Nicotine dependence, cigarettes, uncomplicated: Secondary | ICD-10-CM | POA: Diagnosis not present

## 2016-01-23 LAB — CBC
HCT: 43.4 % (ref 39.0–52.0)
Hemoglobin: 14.5 g/dL (ref 13.0–17.0)
MCH: 27.4 pg (ref 26.0–34.0)
MCHC: 33.4 g/dL (ref 30.0–36.0)
MCV: 81.9 fL (ref 78.0–100.0)
Platelets: 280 10*3/uL (ref 150–400)
RBC: 5.3 MIL/uL (ref 4.22–5.81)
RDW: 14 % (ref 11.5–15.5)
WBC: 11.7 10*3/uL — ABNORMAL HIGH (ref 4.0–10.5)

## 2016-01-23 LAB — BASIC METABOLIC PANEL
Anion gap: 11 (ref 5–15)
BUN: 8 mg/dL (ref 6–20)
CO2: 25 mmol/L (ref 22–32)
Calcium: 9.1 mg/dL (ref 8.9–10.3)
Chloride: 98 mmol/L — ABNORMAL LOW (ref 101–111)
Creatinine, Ser: 1.54 mg/dL — ABNORMAL HIGH (ref 0.61–1.24)
GFR calc Af Amer: >60 mL/min (ref >60)
GFR calc non Af Amer: 53 mL/min — ABNORMAL LOW (ref >60)
Glucose, Bld: 111 mg/dL — ABNORMAL HIGH (ref 65–99)
Potassium: 3.4 mmol/L — ABNORMAL LOW (ref 3.5–5.1)
Sodium: 134 mmol/L — ABNORMAL LOW (ref 135–145)

## 2016-01-23 LAB — I-STAT TROPONIN, ED
Troponin i, poc: 0 ng/mL (ref 0.00–0.08)
Troponin i, poc: 0 ng/mL (ref 0.00–0.08)

## 2016-01-23 MED ORDER — ACETAMINOPHEN 325 MG PO TABS
650.0000 mg | ORAL_TABLET | Freq: Once | ORAL | Status: AC
  Administered 2016-01-23: 650 mg via ORAL
  Filled 2016-01-23: qty 2

## 2016-01-23 NOTE — ED Triage Notes (Signed)
Pt. Stated, I started having some chest pain 2 days ago with some dizziness, I also have anxiety.

## 2016-01-23 NOTE — Discharge Instructions (Signed)
Your primary care physician.   Return to ER as needed for new or worsening symptoms

## 2016-01-23 NOTE — ED Provider Notes (Signed)
[facility] DEPT Provider Note   CSN: [id number] Arrival date & time: 01/23/16  1816     History   Chief Complaint Chief Complaint  Patient presents with  . Chest Pain  . Dizziness    HPI Timothy Mccarthy is a 45 y.o. male. Patient presents evaluation of headache, toe numbness, shoulder pain, and chest pain.  Patient states he had a stressful day at work. He received several phone calls he states having "stressed out". Has a history of anxiety. Was previously on anxiety medication but this was discontinued in favor of Q p.m. trazodone 100 mg which she's been on for the last 4-6 weeks. He had home from work. Started feeling some numbness in his toe on the way home. Started feeling anxious. He rolled out of the window to get some cool air. Upon returning home he laid down. Start getting some tightness in his shoulder has some sharp pain in his chest for this is all resolving also has a headache and is hungry. No history of cardiac disease. He is a smoker, has history of hypertension and takes had chlorothiazide. No family history of heart disease. He has no personal history of high cholesterol, diabetes, or other cardiac risks.  HPI  Past Medical History:  Diagnosis Date  . Adjustment disorder with anxious mood   . Anxiety   . Arthritis    "hands" (09/28/2015)  . Depression   . GERD (gastroesophageal reflux disease)   . Hypertension   . Tobacco use disorder     Patient Active Problem List   Diagnosis Date Noted  . Essential hypertension 11/04/2015  . Abdominal hernia 09/28/2015  . Appendicitis 09/15/2015  . Tobacco use disorder 04/21/2015  . Adjustment disorder with anxious mood 04/21/2015  . Health care maintenance 04/21/2015  . GERD (gastroesophageal reflux disease) 04/21/2015    Past Surgical History:  Procedure Laterality Date  . APPENDECTOMY  09/15/2015  . CARPAL TUNNEL RELEASE Bilateral   . INCISIONAL HERNIA REPAIR N/A 09/29/2015   Procedure: HERNIA REPAIR  INCISIONAL;  Surgeon: Erroll Luna, MD;  Location: [facility];  Service: General;  Laterality: N/A;  . LAPAROSCOPIC APPENDECTOMY N/A 09/15/2015   Procedure: APPENDECTOMY LAPAROSCOPIC;  Surgeon: Judeth Horn, MD;  Location: [facility];  Service: General;  Laterality: N/A;  . LUMPS ON BACK  2013  . WISDOM TOOTH EXTRACTION         Home Medications    Prior to Admission medications   Medication Sig Start Date End Date Taking? Authorizing Provider  calcium carbonate (TUMS - DOSED IN MG ELEMENTAL CALCIUM) 500 MG chewable tablet Chew 2 tablets by mouth daily.    Historical Provider, MD  hydrochlorothiazide (HYDRODIURIL) 25 MG tablet Take 25 mg by mouth daily.    Historical Provider, MD  Multiple Vitamin (ONE-A-DAY MENS PO) Take 2 tablets by mouth daily.    Historical Provider, MD  nicotine (NICODERM CQ - DOSED IN MG/24 HOURS) 14 mg/24hr patch Place 1 patch (14 mg total) onto the skin daily. Patient not taking: Reported on 11/02/2015 04/21/15   Loleta Chance, MD  nicotine polacrilex (NICORETTE) 4 MG gum Take 1 each (4 mg total) by mouth as needed for smoking cessation. Patient not taking: Reported on 11/02/2015 04/21/15   Loleta Chance, MD  omeprazole (PRILOSEC) 40 MG capsule Take 1 capsule by mouth daily.    Historical Provider, MD  pantoprazole (PROTONIX) 20 MG tablet Take 1 tablet (20 mg total) by mouth daily. 11/02/15   Shary Decamp, PA-C  Family History Family History  Problem Relation Age of Onset  . Stroke Mother   . Diabetes type II Mother     Social History Social History  Substance Use Topics  . Smoking status: Current Every Day Smoker    Packs/day: 0.50    Years: 23.00    Types: Cigarettes    Start date: 04/21/1987  . Smokeless tobacco: Never Used     Comment: Patches   . Alcohol use 3.6 oz/week    6 Cans of beer per week     Comment: "drink only on the weekends"     Allergies   Penicillins   Review of Systems Review of Systems  Constitutional: Negative for appetite change, chills,  diaphoresis, fatigue and fever.  HENT: Negative for mouth sores, sore throat and trouble swallowing.   Eyes: Negative for visual disturbance.  Respiratory: Negative for cough, chest tightness, shortness of breath and wheezing.   Cardiovascular: Positive for chest pain.  Gastrointestinal: Negative for abdominal distention, abdominal pain, diarrhea, nausea and vomiting.  Endocrine: Negative for polydipsia, polyphagia and polyuria.  Genitourinary: Negative for dysuria, frequency and hematuria.  Musculoskeletal: Positive for arthralgias. Negative for gait problem.  Skin: Negative for color change, pallor and rash.  Neurological: Positive for numbness and headaches. Negative for dizziness, syncope and light-headedness.  Hematological: Does not bruise/bleed easily.  Psychiatric/Behavioral: Negative for behavioral problems and confusion.     Physical Exam Updated Vital Signs BP 124/91   Pulse 74   Temp 98.1 F (36.7 C) (Oral)   Resp 17   Ht 5\' 7"  (1.702 m)   Wt 202 lb (91.6 kg)   SpO2 97%   BMI 31.64 kg/m   Physical Exam  Constitutional: He is oriented to person, place, and time. He appears well-developed and well-nourished. No distress.  Awake alert. Calm. States he isn't symptomatic currently.  HENT:  Head: Normocephalic.  Eyes: Conjunctivae are normal. Pupils are equal, round, and reactive to light. No scleral icterus.  Neck: Normal range of motion. Neck supple. No thyromegaly present.  Cardiovascular: Normal rate and regular rhythm.  Exam reveals no gallop and no friction rub.   No murmur heard. Pulmonary/Chest: Effort normal and breath sounds normal. No respiratory distress. He has no wheezes. He has no rales.  Bilateral breath sounds clear.  Abdominal: Soft. Bowel sounds are normal. He exhibits no distension. There is no tenderness. There is no rebound.  Musculoskeletal: Normal range of motion.  Neurological: He is alert and oriented to person, place, and time.  Normal  symmetric Strength to shoulder shrug, triceps, biceps, grip,wrist flex/extend,and intrinsics  Norma lsymmetric sensation above and below clavicles, and to all distributions to UEs. Norma symmetric strength to flex/.extend hip and knees, dorsi/plantar flex ankles. Normal symmetric sensation to all distributions to LEs Patellar and achilles reflexes 1-2+. Downgoing Babinski   Skin: Skin is warm and dry. No rash noted.  Psychiatric: He has a normal mood and affect. His behavior is normal.     ED Treatments / Results  Labs (all labs ordered are listed, but only abnormal results are displayed) Labs Reviewed  BASIC METABOLIC PANEL - Abnormal; Notable for the following:       Result Value   Sodium 134 (*)    Potassium 3.4 (*)    Chloride 98 (*)    Glucose, Bld 111 (*)    Creatinine, Ser 1.54 (*)    GFR calc non Af Amer 53 (*)    All other components within normal limits  CBC - Abnormal; Notable for the following:    WBC 11.7 (*)    All other components within normal limits  I-STAT TROPOININ, ED  I-STAT TROPOININ, ED    EKG  EKG Interpretation  Date/Time:  Monday January 23 2016 18:19:48 EST Ventricular Rate:  94 PR Interval:  130 QRS Duration: 82 QT Interval:  380 QTC Calculation: 475 R Axis:   36 Text Interpretation:  Normal sinus rhythm Possible Anterior infarct , age undetermined Abnormal ECG Confirmed by Jeneen Rinks  MD, [place] ([id number]) on 01/23/2016 11:00:31 PM       Radiology Dg Chest 2 View  Result Date: 01/23/2016 CLINICAL DATA:  Left-sided chest pain for 3 days EXAM: CHEST  2 VIEW COMPARISON:  None. FINDINGS: The heart size and mediastinal contours are within normal limits. Both lungs are clear. The visualized skeletal structures are unremarkable. IMPRESSION: No active cardiopulmonary disease. Electronically Signed   By: Inez Catalina M.D.   On: 01/23/2016 20:16    Procedures Procedures (including critical care time)  Medications Ordered in ED Medications    acetaminophen (TYLENOL) tablet 650 mg (not administered)     Initial Impression / Assessment and Plan / ED Course  I have reviewed the triage vital signs and the nursing notes.  Pertinent labs & imaging results that were available during my care of the patient were reviewed by me and considered in my medical decision making (see chart for details).  Clinical Course     EKG normal. First troponin normal. Second troponin pending. I think is very likely anxiety related. He has a low heart score normal EKG and troponin. If his second troponin is normal, he would be a candidate for discharge and outpatient follow-up.  Final Clinical Impressions(s) / ED Diagnoses   Final diagnoses:  None    New Prescriptions New Prescriptions   No medications on file     Tanna Furry, MD 01/23/16 2325

## 2016-01-24 NOTE — ED Notes (Signed)
Pt dc ambulatory instructed on follow up plan PT denies pain NAD at dc

## 2016-03-02 ENCOUNTER — Encounter (HOSPITAL_COMMUNITY): Payer: Self-pay | Admitting: Emergency Medicine

## 2016-03-02 DIAGNOSIS — R1013 Epigastric pain: Secondary | ICD-10-CM | POA: Insufficient documentation

## 2016-03-02 DIAGNOSIS — F17210 Nicotine dependence, cigarettes, uncomplicated: Secondary | ICD-10-CM | POA: Insufficient documentation

## 2016-03-02 DIAGNOSIS — Z79899 Other long term (current) drug therapy: Secondary | ICD-10-CM | POA: Diagnosis not present

## 2016-03-02 DIAGNOSIS — E876 Hypokalemia: Secondary | ICD-10-CM | POA: Insufficient documentation

## 2016-03-02 DIAGNOSIS — I10 Essential (primary) hypertension: Secondary | ICD-10-CM | POA: Diagnosis not present

## 2016-03-02 DIAGNOSIS — K5900 Constipation, unspecified: Secondary | ICD-10-CM | POA: Diagnosis present

## 2016-03-02 NOTE — ED Triage Notes (Signed)
Patient reports central chest pain with SOB , nausea and fatigue , pt. added epigastric/mid abdominal pain with constipation this week .

## 2016-03-03 ENCOUNTER — Emergency Department (HOSPITAL_COMMUNITY)
Admission: EM | Admit: 2016-03-03 | Discharge: 2016-03-03 | Disposition: A | Discharge status: Home / Self Care | Payer: BLUE CROSS/BLUE SHIELD | Attending: Emergency Medicine | Admitting: Emergency Medicine

## 2016-03-03 ENCOUNTER — Emergency Department (HOSPITAL_COMMUNITY): Payer: BLUE CROSS/BLUE SHIELD

## 2016-03-03 DIAGNOSIS — E876 Hypokalemia: Secondary | ICD-10-CM

## 2016-03-03 DIAGNOSIS — R1013 Epigastric pain: Secondary | ICD-10-CM

## 2016-03-03 LAB — I-STAT TROPONIN, ED: Troponin i, poc: 0.01 ng/mL (ref 0.00–0.08)

## 2016-03-03 LAB — URINALYSIS, DIPSTICK ONLY
Bilirubin Urine: NEGATIVE
Glucose, UA: NEGATIVE mg/dL
Hgb urine dipstick: NEGATIVE
Ketones, ur: 15 mg/dL — AB
Leukocytes, UA: NEGATIVE
Nitrite: NEGATIVE
Protein, ur: 30 mg/dL — AB
Specific Gravity, Urine: 1.01 (ref 1.005–1.030)
pH: 7.5 (ref 5.0–8.0)

## 2016-03-03 LAB — BASIC METABOLIC PANEL
Anion gap: 17 — ABNORMAL HIGH (ref 5–15)
BUN: 15 mg/dL (ref 6–20)
CO2: 33 mmol/L — ABNORMAL HIGH (ref 22–32)
Calcium: 9.8 mg/dL (ref 8.9–10.3)
Chloride: 84 mmol/L — ABNORMAL LOW (ref 101–111)
Creatinine, Ser: 1.38 mg/dL — ABNORMAL HIGH (ref 0.61–1.24)
GFR calc Af Amer: >60 mL/min (ref >60)
GFR calc non Af Amer: >60 mL/min (ref >60)
Glucose, Bld: 101 mg/dL — ABNORMAL HIGH (ref 65–99)
Potassium: 2.2 mmol/L — CL (ref 3.5–5.1)
Sodium: 134 mmol/L — ABNORMAL LOW (ref 135–145)

## 2016-03-03 LAB — HEPATIC FUNCTION PANEL
ALT: 26 U/L (ref 17–63)
AST: 43 U/L — ABNORMAL HIGH (ref 15–41)
Albumin: 4.7 g/dL (ref 3.5–5.0)
Alkaline Phosphatase: 84 U/L (ref 38–126)
Bilirubin, Direct: 0.2 mg/dL (ref 0.1–0.5)
Indirect Bilirubin: 0.9 mg/dL (ref 0.3–0.9)
Total Bilirubin: 1.1 mg/dL (ref 0.3–1.2)
Total Protein: 7.7 g/dL (ref 6.5–8.1)

## 2016-03-03 LAB — MAGNESIUM: Magnesium: 2 mg/dL (ref 1.7–2.4)

## 2016-03-03 LAB — CBC
HCT: 44.9 % (ref 39.0–52.0)
Hemoglobin: 15 g/dL (ref 13.0–17.0)
MCH: 27.4 pg (ref 26.0–34.0)
MCHC: 33.4 g/dL (ref 30.0–36.0)
MCV: 81.9 fL (ref 78.0–100.0)
Platelets: 254 10*3/uL (ref 150–400)
RBC: 5.48 MIL/uL (ref 4.22–5.81)
RDW: 14.1 % (ref 11.5–15.5)
WBC: 13.1 10*3/uL — ABNORMAL HIGH (ref 4.0–10.5)

## 2016-03-03 MED ORDER — ONDANSETRON 4 MG PO TBDP: 4.0000 mg | ORAL_TABLET | Freq: Three times a day (TID) | ORAL | 0 refills | Status: DC | PRN

## 2016-03-03 MED ORDER — SUCRALFATE 1 GM/10ML PO SUSP
1.0000 g | Freq: Once | ORAL | Status: AC
Start: 2016-03-03 — End: 2016-03-03
  Administered 2016-03-03: 1 g via ORAL
  Filled 2016-03-03: qty 10

## 2016-03-03 MED ORDER — SUCRALFATE 1 GM/10ML PO SUSP: 1.0000 g | Freq: Three times a day (TID) | ORAL | 0 refills | Status: DC | PRN

## 2016-03-03 MED ORDER — POTASSIUM CHLORIDE 2 MEQ/ML IV SOLN
30.0000 meq | Freq: Once | INTRAVENOUS | Status: AC
  Administered 2016-03-03: 30 meq via INTRAVENOUS
  Filled 2016-03-03: qty 15

## 2016-03-03 MED ORDER — POTASSIUM CHLORIDE CRYS ER 20 MEQ PO TBCR
60.0000 meq | EXTENDED_RELEASE_TABLET | Freq: Once | ORAL | Status: AC
  Administered 2016-03-03: 60 meq via ORAL
  Filled 2016-03-03: qty 3

## 2016-03-03 NOTE — ED Notes (Signed)
Potassium reported to Dr. Claudine Mouton.  Will add orders and this RN  will upgrade pt's acuity.

## 2016-03-03 NOTE — ED Provider Notes (Signed)
[facility] DEPT Provider Note   CSN: [id number] Arrival date & time: 03/02/16  2342  By signing my name below, I, Evelene Croon, attest that this documentation has been prepared under the direction and in the presence of Everlene Balls, MD . Electronically Signed: Evelene Croon, Scribe. 03/03/2016. 4:02 AM.  History   Chief Complaint Chief Complaint  Patient presents with  . Chest Pain  . Abdominal Pain    The history is provided by the patient. No language interpreter was used.   HPI Comments:  GENT RACK is a 46 y.o. male with a history of appendectomy and GERD, who presents to the Emergency Department complaining of moderate RUQ and epigastric abdominal pain x 5 days. He notes his pain is worse after eating.  He reports associated nausea and vomiting x 1 week, and constipation. Pt took a laxative without relief. His last BM was yesterday with very little stool. No alleviating factors noted. No fever. No h/o cholecystectomy .   Past Medical History:  Diagnosis Date  . Adjustment disorder with anxious mood   . Anxiety   . Arthritis    "hands" (09/28/2015)  . Depression   . GERD (gastroesophageal reflux disease)   . Hypertension   . Tobacco use disorder     Patient Active Problem List   Diagnosis Date Noted  . Essential hypertension 11/04/2015  . Abdominal hernia 09/28/2015  . Appendicitis 09/15/2015  . Tobacco use disorder 04/21/2015  . Adjustment disorder with anxious mood 04/21/2015  . Health care maintenance 04/21/2015  . GERD (gastroesophageal reflux disease) 04/21/2015    Past Surgical History:  Procedure Laterality Date  . APPENDECTOMY  09/15/2015  . CARPAL TUNNEL RELEASE Bilateral   . INCISIONAL HERNIA REPAIR N/A 09/29/2015   Procedure: HERNIA REPAIR INCISIONAL;  Surgeon: Erroll Luna, MD;  Location: [facility];  Service: General;  Laterality: N/A;  . LAPAROSCOPIC APPENDECTOMY N/A 09/15/2015   Procedure: APPENDECTOMY LAPAROSCOPIC;  Surgeon: Judeth Horn, MD;   Location: [facility];  Service: General;  Laterality: N/A;  . LUMPS ON BACK  2013  . WISDOM TOOTH EXTRACTION         Home Medications    Prior to Admission medications   Medication Sig Start Date End Date Taking? Authorizing Provider  calcium carbonate (TUMS - DOSED IN MG ELEMENTAL CALCIUM) 500 MG chewable tablet Chew 2 tablets by mouth daily.    Historical Provider, MD  hydrochlorothiazide (HYDRODIURIL) 25 MG tablet Take 25 mg by mouth daily.    Historical Provider, MD  Multiple Vitamin (ONE-A-DAY MENS PO) Take 2 tablets by mouth daily.    Historical Provider, MD  nicotine (NICODERM CQ - DOSED IN MG/24 HOURS) 14 mg/24hr patch Place 1 patch (14 mg total) onto the skin daily. Patient not taking: Reported on 11/02/2015 04/21/15   Loleta Chance, MD  nicotine polacrilex (NICORETTE) 4 MG gum Take 1 each (4 mg total) by mouth as needed for smoking cessation. Patient not taking: Reported on 11/02/2015 04/21/15   Loleta Chance, MD  omeprazole (PRILOSEC) 40 MG capsule Take 1 capsule by mouth daily.    Historical Provider, MD  ondansetron (ZOFRAN ODT) 4 MG disintegrating tablet Take 1 tablet (4 mg total) by mouth every 8 (eight) hours as needed for nausea or vomiting. 03/03/16   Everlene Balls, MD  pantoprazole (PROTONIX) 20 MG tablet Take 1 tablet (20 mg total) by mouth daily. 11/02/15   Shary Decamp, PA-C  sucralfate (CARAFATE) 1 GM/10ML suspension Take 10 mLs (1 g total) by mouth  3 (three) times daily as needed (heartburn). 03/03/16   Everlene Balls, MD    Family History Family History  Problem Relation Age of Onset  . Stroke Mother   . Diabetes type II Mother     Social History Social History  Substance Use Topics  . Smoking status: Current Every Day Smoker    Packs/day: 0.50    Years: 23.00    Types: Cigarettes    Start date: 04/21/1987  . Smokeless tobacco: Never Used     Comment: Patches   . Alcohol use 3.6 oz/week    6 Cans of beer per week     Comment: "drink only on the weekends"     Allergies     Penicillins   Review of Systems Review of Systems 10 systems reviewed and all are negative for acute change except as noted in the HPI.  Physical Exam Updated Vital Signs BP 137/95   Pulse 80   Temp 98 F (36.7 C) (Oral)   Resp 15   Ht 5\' 7"  (1.702 m)   SpO2 95%   Physical Exam  Constitutional: He is oriented to person, place, and time. Vital signs are normal. He appears well-developed and well-nourished.  Non-toxic appearance. He does not appear ill. No distress.  HENT:  Head: Normocephalic and atraumatic.  Nose: Nose normal.  Mouth/Throat: Oropharynx is clear and moist. No oropharyngeal exudate.  Eyes: Conjunctivae and EOM are normal. Pupils are equal, round, and reactive to light. No scleral icterus.  Neck: Normal range of motion. Neck supple. No tracheal deviation, no edema, no erythema and normal range of motion present. No thyroid mass and no thyromegaly present.  Cardiovascular: Normal rate, regular rhythm, S1 normal, S2 normal, normal heart sounds, intact distal pulses and normal pulses.  Exam reveals no gallop and no friction rub.   No murmur heard. Pulmonary/Chest: Effort normal and breath sounds normal. No respiratory distress. He has no wheezes. He has no rhonchi. He has no rales.  Abdominal: Soft. Normal appearance and bowel sounds are normal. He exhibits no distension, no ascites and no mass. There is no hepatosplenomegaly. There is tenderness. There is no rebound, no guarding and no CVA tenderness.  Mid epigastric TTP  Musculoskeletal: Normal range of motion. He exhibits no edema or tenderness.  Lymphadenopathy:    He has no cervical adenopathy.  Neurological: He is alert and oriented to person, place, and time. He has normal strength. No cranial nerve deficit or sensory deficit.  Skin: Skin is warm, dry and intact. No petechiae and no rash noted. He is not diaphoretic. No erythema. No pallor.  Nursing note and vitals reviewed.   ED Treatments / Results   DIAGNOSTIC STUDIES:  Oxygen Saturation is 100% on RA, normal by my interpretation.    COORDINATION OF CARE:  3:53 AM Discussed treatment plan with pt at bedside and pt agreed to plan.  Labs (all labs ordered are listed, but only abnormal results are displayed) Labs Reviewed  BASIC METABOLIC PANEL - Abnormal; Notable for the following:       Result Value   Sodium 134 (*)    Potassium 2.2 (*)    Chloride 84 (*)    CO2 33 (*)    Glucose, Bld 101 (*)    Creatinine, Ser 1.38 (*)    Anion gap 17 (*)    All other components within normal limits  CBC - Abnormal; Notable for the following:    WBC 13.1 (*)    All other  components within normal limits  URINALYSIS, DIPSTICK ONLY - Abnormal; Notable for the following:    Ketones, ur 15 (*)    Protein, ur 30 (*)    All other components within normal limits  HEPATIC FUNCTION PANEL - Abnormal; Notable for the following:    AST 43 (*)    All other components within normal limits  MAGNESIUM  I-STAT TROPOININ, ED    EKG  EKG Interpretation  Date/Time:  Friday March 02 2016 23:47:41 EST Ventricular Rate:  103 PR Interval:  80 QRS Duration: 90 QT Interval:  520 QTC Calculation: 681 R Axis:   59 Text Interpretation:   Critical Test Result: Long QTc Sinus tachycardia with short PR Prolonged QT Abnormal ECG No significant change since last tracing Confirmed by Glynn Octave [phone]) on 03/03/2016 3:39:46 AM       Radiology Dg Chest 2 View  Result Date: 03/03/2016 CLINICAL DATA:  46 y/o  M; chest pain. EXAM: CHEST  2 VIEW COMPARISON:  01/23/2016 chest radiograph FINDINGS: The heart size and mediastinal contours are within normal limits and stable. Both lungs are clear. The visualized skeletal structures are unremarkable. IMPRESSION: No active cardiopulmonary disease. Electronically Signed   By: Kristine Garbe M.D.   On: 03/03/2016 01:02    Procedures Procedures (including critical care time)  Medications Ordered  in ED Medications  potassium chloride 30 mEq in sodium chloride 0.9 % 265 mL (KCL MULTIRUN) IVPB (30 mEq Intravenous Given 03/03/16 0431)  sucralfate (CARAFATE) 1 GM/10ML suspension 1 g (0 g Oral Hold 03/03/16 0432)  potassium chloride SA (K-DUR,KLOR-CON) CR tablet 60 mEq (60 mEq Oral Given 03/03/16 0248)     Initial Impression / Assessment and Plan / ED Course  I have reviewed the triage vital signs and the nursing notes.  Pertinent labs & imaging results that were available during my care of the patient were reviewed by me and considered in my medical decision making (see chart for details).    Patient presents to the ED for abdominal pain. Midepigastric with some radiation to his chest, consistent with GERD.  He has not been eating due to this.  Potassium is very low at 2.2.  He was given 60 PO and 30IV.  EKG shows prolonged Qt.  Will recheck EKG after replacement. Magnesium is normal. He was given carafate for his symptoms.  Will continue to monitor.   5:38 AM Repeat EKG reveals QTc is now normal. Symptomaticall patient is better as well.  DC home with zofran and sucrafate as needed.  VS ermain within his normal limits and he is safe for DC.  Final Clinical Impressions(s) / ED Diagnoses   Final diagnoses:  Hypokalemia  Epigastric pain    New Prescriptions New Prescriptions   ONDANSETRON (ZOFRAN ODT) 4 MG DISINTEGRATING TABLET    Take 1 tablet (4 mg total) by mouth every 8 (eight) hours as needed for nausea or vomiting.   SUCRALFATE (CARAFATE) 1 GM/10ML SUSPENSION    Take 10 mLs (1 g total) by mouth 3 (three) times daily as needed (heartburn).   I personally performed the services described in this documentation, which was scribed in my presence. The recorded information has been reviewed and is accurate.      Everlene Balls, MD 03/03/16 [phone]

## 2016-03-14 ENCOUNTER — Encounter: Payer: Self-pay | Admitting: Physician Assistant

## 2016-03-14 ENCOUNTER — Ambulatory Visit: Payer: BLUE CROSS/BLUE SHIELD | Attending: Internal Medicine | Admitting: Physician Assistant

## 2016-03-14 VITALS — BP 130/89 | HR 89 | Temp 99.0°F | Resp 16 | Wt 200.4 lb

## 2016-03-14 DIAGNOSIS — R112 Nausea with vomiting, unspecified: Secondary | ICD-10-CM | POA: Diagnosis not present

## 2016-03-14 DIAGNOSIS — I10 Essential (primary) hypertension: Secondary | ICD-10-CM | POA: Diagnosis not present

## 2016-03-14 DIAGNOSIS — F329 Major depressive disorder, single episode, unspecified: Secondary | ICD-10-CM | POA: Insufficient documentation

## 2016-03-14 DIAGNOSIS — Z720 Tobacco use: Secondary | ICD-10-CM | POA: Insufficient documentation

## 2016-03-14 DIAGNOSIS — F419 Anxiety disorder, unspecified: Secondary | ICD-10-CM | POA: Diagnosis not present

## 2016-03-14 DIAGNOSIS — Z880 Allergy status to penicillin: Secondary | ICD-10-CM | POA: Insufficient documentation

## 2016-03-14 DIAGNOSIS — R109 Unspecified abdominal pain: Secondary | ICD-10-CM | POA: Diagnosis not present

## 2016-03-14 DIAGNOSIS — E876 Hypokalemia: Secondary | ICD-10-CM | POA: Insufficient documentation

## 2016-03-14 DIAGNOSIS — K219 Gastro-esophageal reflux disease without esophagitis: Secondary | ICD-10-CM | POA: Insufficient documentation

## 2016-03-14 MED ORDER — OMEPRAZOLE 40 MG PO CPDR: 40.0000 mg | DELAYED_RELEASE_CAPSULE | Freq: Every day | ORAL | 2 refills | Status: DC

## 2016-03-14 MED ORDER — BUSPIRONE HCL 15 MG PO TABS: 15.0000 mg | ORAL_TABLET | Freq: Two times a day (BID) | ORAL | 2 refills | Status: DC

## 2016-03-14 NOTE — Progress Notes (Signed)
Timothy Mccarthy  [id number]  [id number]  DOB - Jul 12, 1970  Chief Complaint  Patient presents with  . Follow-up  . Abdominal Pain       Subjective:   Timothy Mccarthy is a 46 y.o. male here today for establishment of care. He has a history anxiety mixed with depression, smoking, hypertension and gastroesophageal reflux disease. He was in the emergency department on 03/09/2016 with complaints of right upper quadrant/epigastric pain for 5 days intermittently. Is worse with eating. He has associated nausea and vomiting as well. Sometimes is constipated. His blood pressure was 137/95. His potassium was 2.2. His sodium was 134. His creatinine was 1.3. There were some ketones and protein in his urine. His AST was 43. A chest x-ray was negative. He was given potassium and sucralfate in the ED with improvement in his symptoms. He was sent home with a prescription for Zofran and sucralfate. He states that he still having some nausea and vomiting with certain foods. Previously he was on a proton pump inhibitor and had better relief of the abdominal pain.  He also explains that he's been off his anxiety medicines for several months. He was recently given some trazodone by somebody but it doesn't help much. He is not sleeping well. Having frequent" anxiety attacks".  ROS: GEN: denies fever or chills, denies change in weight Skin: denies lesions or rashes HEENT: denies headache, earache, epistaxis, sore throat, or neck pain LUNGS: denies SHOB, dyspnea, PND, orthopnea CV: denies CP or palpitations ABD: denies abd pain, N or V EXT: denies muscle spasms or swelling; no pain in lower ext, no weakness NEURO: denies numbness or tingling, denies sz, stroke or TIA  ALLERGIES: Allergies  Allergen Reactions  . Penicillins Anaphylaxis    Has patient had a PCN reaction causing immediate rash, facial/tongue/throat swelling, SOB or lightheadedness with hypotensionNO Has patient had a PCN reaction causing  severe rash involving mucus membranes or skin necrosis: NO Has patient had a PCN reaction that required hospitalization NO Has patient had a PCN reaction occurring within the last 10 years: NO If all of the above answers are "NO", then may proceed with Cephalosporin use.    PAST MEDICAL HISTORY: Past Medical History:  Diagnosis Date  . Adjustment disorder with anxious mood   . Anxiety   . Arthritis    "hands" (09/28/2015)  . Depression   . GERD (gastroesophageal reflux disease)   . Hypertension   . Tobacco use disorder     PAST SURGICAL HISTORY: Past Surgical History:  Procedure Laterality Date  . APPENDECTOMY  09/15/2015  . CARPAL TUNNEL RELEASE Bilateral   . INCISIONAL HERNIA REPAIR N/A 09/29/2015   Procedure: HERNIA REPAIR INCISIONAL;  Surgeon: Erroll Luna, MD;  Location: [facility];  Service: General;  Laterality: N/A;  . LAPAROSCOPIC APPENDECTOMY N/A 09/15/2015   Procedure: APPENDECTOMY LAPAROSCOPIC;  Surgeon: Judeth Horn, MD;  Location: [facility];  Service: General;  Laterality: N/A;  . LUMPS ON BACK  2013  . WISDOM TOOTH EXTRACTION      MEDICATIONS AT HOME: Prior to Admission medications   Medication Sig Start Date End Date Taking? Authorizing Provider  hydrochlorothiazide (HYDRODIURIL) 25 MG tablet Take 25 mg by mouth daily.   Yes Historical Provider, MD  ondansetron (ZOFRAN ODT) 4 MG disintegrating tablet Take 1 tablet (4 mg total) by mouth every 8 (eight) hours as needed for nausea or vomiting. 03/03/16  Yes Everlene Balls, MD  sucralfate (CARAFATE) 1 GM/10ML suspension Take 10 mLs (1 g total) by  mouth 3 (three) times daily as needed (heartburn). 03/03/16  Yes Everlene Balls, MD  busPIRone (BUSPAR) 15 MG tablet Take 1 tablet (15 mg total) by mouth 2 (two) times daily. 03/14/16   Brayton Caves, PA-C  calcium carbonate (TUMS - DOSED IN MG ELEMENTAL CALCIUM) 500 MG chewable tablet Chew 2 tablets by mouth daily.    Historical Provider, MD  Multiple Vitamin (ONE-A-DAY MENS PO) Take 2  tablets by mouth daily.    Historical Provider, MD  nicotine (NICODERM CQ - DOSED IN MG/24 HOURS) 14 mg/24hr patch Place 1 patch (14 mg total) onto the skin daily. Patient not taking: Reported on 11/02/2015 04/21/15   Loleta Chance, MD  nicotine polacrilex (NICORETTE) 4 MG gum Take 1 each (4 mg total) by mouth as needed for smoking cessation. Patient not taking: Reported on 11/02/2015 04/21/15   Loleta Chance, MD  omeprazole (PRILOSEC) 40 MG capsule Take 1 capsule (40 mg total) by mouth daily. 03/14/16   Tiffany Daneil Dan, PA-C  pantoprazole (PROTONIX) 20 MG tablet Take 1 tablet (20 mg total) by mouth daily. Patient not taking: Reported on 03/14/2016 11/02/15   Shary Decamp, PA-C     Objective:   Vitals:   03/14/16 1509  BP: 130/89  Pulse: 89  Resp: 16  Temp: 99 F (37.2 C)  TempSrc: Oral  SpO2: 96%  Weight: 200 lb 6.4 oz (90.9 kg)   Exam General appearance : Awake, alert, not in any distress. Speech Clear. Not toxic looking HEENT: Atraumatic and Normocephalic, pupils equally reactive to light and accomodation Neck: supple, no JVD. No cervical lymphadenopathy.  Chest:Good air entry bilaterally, no added sounds  CVS: S1 S2 regular, no murmurs.  Abdomen: Bowel sounds present, Non tender and not distended with no gaurding, rigidity or rebound. Extremities: B/L Lower Ext shows no edema, both legs are warm to touch Neurology: Awake alert, and oriented X 3, CN II-XII intact, Non focal Skin:No Rash Wounds:N/A  Assessment & Plan  1. GERD  -refill PPI  -Zofran/Carafate prn  2. Anxiety   -add Buspar   3. Smoker   -cessation discussed, not ready to quit  4. Hypokalemia  -BMP today   Return in about 2 weeks (around 03/28/2016).  The patient was given clear instructions to go to ER or return to medical center if symptoms don't improve, worsen or new problems develop. The patient verbalized understanding. The patient was told to call to get lab results if they haven't heard anything in the next  week.   This note has been created with Surveyor, quantity. Any transcriptional errors are unintentional.    Zettie Pho, PA-C [facility] and [facility] [place], [place]   03/14/2016, 3:31 PM

## 2016-03-15 LAB — BASIC METABOLIC PANEL
BUN: 11 mg/dL (ref 7–25)
CO2: 26 mmol/L (ref 20–31)
Calcium: 9.3 mg/dL (ref 8.6–10.3)
Chloride: 100 mmol/L (ref 98–110)
Creat: 1.36 mg/dL — ABNORMAL HIGH (ref 0.60–1.35)
Glucose, Bld: 89 mg/dL (ref 65–99)
Potassium: 3.9 mmol/L (ref 3.5–5.3)
Sodium: 137 mmol/L (ref 135–146)

## 2016-04-11 ENCOUNTER — Ambulatory Visit: Payer: BLUE CROSS/BLUE SHIELD | Attending: Family Medicine | Admitting: Family Medicine

## 2016-04-11 ENCOUNTER — Encounter: Payer: Self-pay | Admitting: Family Medicine

## 2016-04-11 VITALS — BP 118/78 | HR 92 | Temp 98.7°F | Ht 67.0 in | Wt 201.6 lb

## 2016-04-11 DIAGNOSIS — F17200 Nicotine dependence, unspecified, uncomplicated: Secondary | ICD-10-CM | POA: Diagnosis not present

## 2016-04-11 DIAGNOSIS — F329 Major depressive disorder, single episode, unspecified: Secondary | ICD-10-CM | POA: Diagnosis not present

## 2016-04-11 DIAGNOSIS — I10 Essential (primary) hypertension: Secondary | ICD-10-CM | POA: Insufficient documentation

## 2016-04-11 DIAGNOSIS — F419 Anxiety disorder, unspecified: Secondary | ICD-10-CM | POA: Diagnosis not present

## 2016-04-11 DIAGNOSIS — K219 Gastro-esophageal reflux disease without esophagitis: Secondary | ICD-10-CM | POA: Diagnosis not present

## 2016-04-11 DIAGNOSIS — M545 Low back pain, unspecified: Secondary | ICD-10-CM

## 2016-04-11 DIAGNOSIS — H9202 Otalgia, left ear: Secondary | ICD-10-CM | POA: Insufficient documentation

## 2016-04-11 MED ORDER — CETIRIZINE HCL 10 MG PO TABS: 10.0000 mg | ORAL_TABLET | Freq: Every day | ORAL | 1 refills | Status: DC

## 2016-04-11 MED ORDER — HYDROCHLOROTHIAZIDE 25 MG PO TABS: 25.0000 mg | ORAL_TABLET | Freq: Every day | ORAL | 3 refills | Status: DC

## 2016-04-11 MED ORDER — METHOCARBAMOL 750 MG PO TABS: 750.0000 mg | ORAL_TABLET | Freq: Two times a day (BID) | ORAL | 1 refills | Status: DC | PRN

## 2016-04-11 NOTE — Patient Instructions (Signed)

## 2016-04-11 NOTE — Progress Notes (Signed)
Subjective:  Patient ID: Timothy Mccarthy, male    DOB: [birth year]  Age: 46 y.o. MRN: [id number]  CC: Hypertension; Gastroesophageal Reflux; and Back Pain (lower right side)   HPI Timothy Mccarthy is a 46 year old male with a history of hypertension, GERD, anxiety who comes into the clinic for a follow-up visit. He was seen by the PE at his last visit and commenced on a PPI and an anxiolytic and reports improvement in symptoms.  Today he complains of right-sided lower back pain with no prior history of trauma which has been on for the last couple of days. Pain does not radiate; denies numbness in extremities. He complains of left ear feeling "stopped up" and occasional intermittent pain. Also endorses intermittent pain around his left eye but denies cough, nasal congestion, postnasal drip.  Past Medical History:  Diagnosis Date  . Adjustment disorder with anxious mood   . Anxiety   . Arthritis    "hands" (09/28/2015)  . Depression   . GERD (gastroesophageal reflux disease)   . Hypertension   . Tobacco use disorder     Past Surgical History:  Procedure Laterality Date  . APPENDECTOMY  09/15/2015  . CARPAL TUNNEL RELEASE Bilateral   . INCISIONAL HERNIA REPAIR N/A 09/29/2015   Procedure: HERNIA REPAIR INCISIONAL;  Surgeon: Erroll Luna, MD;  Location: [facility];  Service: General;  Laterality: N/A;  . LAPAROSCOPIC APPENDECTOMY N/A 09/15/2015   Procedure: APPENDECTOMY LAPAROSCOPIC;  Surgeon: Judeth Horn, MD;  Location: [facility];  Service: General;  Laterality: N/A;  . LUMPS ON BACK  2013  . WISDOM TOOTH EXTRACTION       Outpatient Medications Prior to Visit  Medication Sig Dispense Refill  . busPIRone (BUSPAR) 15 MG tablet Take 1 tablet (15 mg total) by mouth 2 (two) times daily. 60 tablet 2  . calcium carbonate (TUMS - DOSED IN MG ELEMENTAL CALCIUM) 500 MG chewable tablet Chew 2 tablets by mouth daily.    . Multiple Vitamin (ONE-A-DAY MENS PO) Take 2 tablets by mouth daily.    . nicotine  (NICODERM CQ - DOSED IN MG/24 HOURS) 14 mg/24hr patch Place 1 patch (14 mg total) onto the skin daily. 30 patch 0  . omeprazole (PRILOSEC) 40 MG capsule Take 1 capsule (40 mg total) by mouth daily. 30 capsule 2  . ondansetron (ZOFRAN ODT) 4 MG disintegrating tablet Take 1 tablet (4 mg total) by mouth every 8 (eight) hours as needed for nausea or vomiting. 12 tablet 0  . sucralfate (CARAFATE) 1 GM/10ML suspension Take 10 mLs (1 g total) by mouth 3 (three) times daily as needed (heartburn). 420 mL 0  . hydrochlorothiazide (HYDRODIURIL) 25 MG tablet Take 25 mg by mouth daily.    . nicotine polacrilex (NICORETTE) 4 MG gum Take 1 each (4 mg total) by mouth as needed for smoking cessation. (Patient not taking: Reported on 11/02/2015) 100 tablet 0  . pantoprazole (PROTONIX) 20 MG tablet Take 1 tablet (20 mg total) by mouth daily. (Patient not taking: Reported on 03/14/2016) 30 tablet 0   No facility-administered medications prior to visit.     ROS Review of Systems  Constitutional: Negative for activity change and appetite change.  HENT: Positive for ear pain. Negative for sinus pressure and sore throat.   Eyes: Negative for visual disturbance.  Respiratory: Negative for cough, chest tightness and shortness of breath.   Cardiovascular: Negative for chest pain and leg swelling.  Gastrointestinal: Negative for abdominal distention, abdominal pain, constipation and diarrhea.  Endocrine: Negative.   Genitourinary: Negative for dysuria.  Musculoskeletal: Positive for back pain. Negative for joint swelling and myalgias.  Skin: Negative for rash.  Allergic/Immunologic: Negative.   Neurological: Negative for weakness, light-headedness and numbness.  Psychiatric/Behavioral: Negative for dysphoric mood and suicidal ideas.    Objective:  BP 118/78 (BP Location: Right Arm, Patient Position: Sitting, Cuff Size: Small)   Pulse 92   Temp 98.7 F (37.1 C) (Oral)   Ht 5\' 7"  (1.702 m)   Wt 201 lb 9.6 oz (91.4  kg)   SpO2 96%   BMI 31.58 kg/m   BP/Weight 04/11/2016 03/14/2016 [id number]  Systolic BP [id number]  Diastolic BP 78 89 92  Wt. (Lbs) 201.6 200.4 -  BMI 31.58 31.39 -      Physical Exam  Constitutional: He is oriented to person, place, and time. He appears well-developed and well-nourished.  HENT:  Right Ear: External ear normal.  Left Ear: External ear normal.  Mouth/Throat: Oropharynx is clear and moist.  Cardiovascular: Normal rate, normal heart sounds and intact distal pulses.   No murmur heard. Pulmonary/Chest: Effort normal and breath sounds normal. He has no wheezes. He has no rales. He exhibits no tenderness.  Abdominal: Soft. Bowel sounds are normal. He exhibits no distension and no mass. There is no tenderness.  Musculoskeletal: Normal range of motion. He exhibits tenderness ( slight tenderness on palpation of the right side of back).  Negative straight leg raise bilaterally.  Neurological: He is alert and oriented to person, place, and time.     Assessment & Plan:   1. Gastroesophageal reflux disease, esophagitis presence not specified Controlled Continue PPI  2. Essential hypertension Controlled Low-sodium diet - hydrochlorothiazide (HYDRODIURIL) 25 MG tablet; Take 1 tablet (25 mg total) by mouth daily.  Dispense: 30 tablet; Refill: 3  3. Acute right-sided low back pain without sciatica Apply heat Placed on muscle relaxants and discussed sedating side effects Demonstrated back exercises - methocarbamol (ROBAXIN-750) 750 MG tablet; Take 1 tablet (750 mg total) by mouth 2 (two) times daily as needed for muscle spasms.  Dispense: 60 tablet; Refill: 1  4. Left ear pain Could be sinus related - cetirizine (ZYRTEC) 10 MG tablet; Take 1 tablet (10 mg total) by mouth daily.  Dispense: 30 tablet; Refill: 1   Meds ordered this encounter  Medications  . cetirizine (ZYRTEC) 10 MG tablet    Sig: Take 1 tablet (10 mg total) by mouth daily.    Dispense:  30 tablet      Refill:  1  . methocarbamol (ROBAXIN-750) 750 MG tablet    Sig: Take 1 tablet (750 mg total) by mouth 2 (two) times daily as needed for muscle spasms.    Dispense:  60 tablet    Refill:  1  . hydrochlorothiazide (HYDRODIURIL) 25 MG tablet    Sig: Take 1 tablet (25 mg total) by mouth daily.    Dispense:  30 tablet    Refill:  3    Follow-up: Return in about 3 months (around 07/09/2016) for follow up on Hypertension.   Arnoldo Morale MD

## 2016-04-12 ENCOUNTER — Ambulatory Visit: Payer: BLUE CROSS/BLUE SHIELD | Admitting: Family Medicine

## 2016-06-04 ENCOUNTER — Ambulatory Visit: Payer: BLUE CROSS/BLUE SHIELD | Admitting: Family Medicine

## 2016-06-06 ENCOUNTER — Encounter: Payer: Self-pay | Admitting: Family Medicine

## 2016-06-06 ENCOUNTER — Ambulatory Visit: Payer: BLUE CROSS/BLUE SHIELD | Attending: Family Medicine | Admitting: Family Medicine

## 2016-06-06 VITALS — BP 129/86 | HR 99 | Temp 97.8°F | Ht 67.0 in | Wt 197.8 lb

## 2016-06-06 DIAGNOSIS — M545 Low back pain, unspecified: Secondary | ICD-10-CM

## 2016-06-06 DIAGNOSIS — J302 Other seasonal allergic rhinitis: Secondary | ICD-10-CM | POA: Insufficient documentation

## 2016-06-06 DIAGNOSIS — F419 Anxiety disorder, unspecified: Secondary | ICD-10-CM | POA: Diagnosis not present

## 2016-06-06 DIAGNOSIS — K219 Gastro-esophageal reflux disease without esophagitis: Secondary | ICD-10-CM | POA: Insufficient documentation

## 2016-06-06 DIAGNOSIS — I10 Essential (primary) hypertension: Secondary | ICD-10-CM

## 2016-06-06 DIAGNOSIS — Z880 Allergy status to penicillin: Secondary | ICD-10-CM | POA: Diagnosis not present

## 2016-06-06 DIAGNOSIS — M25511 Pain in right shoulder: Secondary | ICD-10-CM | POA: Diagnosis not present

## 2016-06-06 DIAGNOSIS — J3089 Other allergic rhinitis: Secondary | ICD-10-CM | POA: Diagnosis not present

## 2016-06-06 DIAGNOSIS — F329 Major depressive disorder, single episode, unspecified: Secondary | ICD-10-CM | POA: Diagnosis not present

## 2016-06-06 DIAGNOSIS — Z720 Tobacco use: Secondary | ICD-10-CM | POA: Diagnosis not present

## 2016-06-06 DIAGNOSIS — M549 Dorsalgia, unspecified: Secondary | ICD-10-CM | POA: Diagnosis present

## 2016-06-06 MED ORDER — HYDROCHLOROTHIAZIDE 25 MG PO TABS: 25.0000 mg | ORAL_TABLET | Freq: Every day | ORAL | 3 refills | Status: DC

## 2016-06-06 MED ORDER — IBUPROFEN 600 MG PO TABS: 600.0000 mg | ORAL_TABLET | Freq: Two times a day (BID) | ORAL | 2 refills | Status: DC | PRN

## 2016-06-06 MED ORDER — BUSPIRONE HCL 15 MG PO TABS: 15.0000 mg | ORAL_TABLET | Freq: Two times a day (BID) | ORAL | 3 refills | Status: DC

## 2016-06-06 MED ORDER — OMEPRAZOLE 40 MG PO CPDR: 40.0000 mg | DELAYED_RELEASE_CAPSULE | Freq: Every day | ORAL | 3 refills | Status: DC

## 2016-06-06 MED ORDER — CETIRIZINE HCL 10 MG PO TABS: 10.0000 mg | ORAL_TABLET | Freq: Every day | ORAL | 3 refills | Status: DC

## 2016-06-06 MED ORDER — PREDNISONE 20 MG PO TABS: 20.0000 mg | ORAL_TABLET | Freq: Two times a day (BID) | ORAL | 0 refills | Status: DC

## 2016-06-06 MED ORDER — METHOCARBAMOL 750 MG PO TABS: 750.0000 mg | ORAL_TABLET | Freq: Two times a day (BID) | ORAL | 3 refills | Status: DC | PRN

## 2016-06-06 NOTE — Patient Instructions (Signed)
Shoulder Pain Many things can cause shoulder pain, including:  An injury.  Moving the arm in the same way again and again (overuse).  Joint pain (arthritis). Follow these instructions at home: Take these actions to help with your pain:  Squeeze a soft ball or a foam pad as much as you can. This helps to prevent swelling. It also makes the arm stronger.  Take over-the-counter and prescription medicines only as told by your doctor.  If told, put ice on the area:  Put ice in a plastic bag.  Place a towel between your skin and the bag.  Leave the ice on for 20 minutes, 2-3 times per day. Stop putting on ice if it does not help with the pain.  If you were given a shoulder sling or immobilizer:  Wear it as told.  Remove it to shower or bathe.  Move your arm as little as possible.  Keep your hand moving. This helps prevent swelling. Contact a doctor if:  Your pain gets worse.  Medicine does not help your pain.  You have new pain in your arm, hand, or fingers. Get help right away if:  Your arm, hand, or fingers:  Tingle.  Are numb.  Are swollen.  Are painful.  Turn white or blue. This information is not intended to replace advice given to you by your health care provider. Make sure you discuss any questions you have with your health care provider. Document Released: 07/18/2007 Document Revised: 09/25/2015 Document Reviewed: 05/24/2014 Elsevier Interactive Patient Education  2017 [employer]  

## 2016-06-06 NOTE — Progress Notes (Signed)
Subjective:  Patient ID: Timothy Mccarthy, male    DOB: 01-17-1971  Age: 46 y.o. MRN: [id number]  CC: Back Pain and Shoulder Pain   HPI Timothy Mccarthy is a 46 year old male with a history of hypertension, GERD, anxiety who presents today for a follow-up visit.  He complains of anterior right shoulder pain for the last 4 days ever since he attempted to move a refrigerator with his brother; pain prevents him from fully extending his right arm. Pain is described as a 7 out of 10 and this worse with reaching overhead.  He does have low back pain and has been taking Robaxin with improvement in symptoms.  His anxiety and reflux have been controlled on current medication regimen and he has been compliant with his antihypertensives. Denies chest pains or shortness of breath.  Past Medical History:  Diagnosis Date  . Adjustment disorder with anxious mood   . Anxiety   . Arthritis    "hands" (09/28/2015)  . Depression   . GERD (gastroesophageal reflux disease)   . Hypertension   . Tobacco use disorder     Past Surgical History:  Procedure Laterality Date  . APPENDECTOMY  09/15/2015  . CARPAL TUNNEL RELEASE Bilateral   . INCISIONAL HERNIA REPAIR N/A 09/29/2015   Procedure: HERNIA REPAIR INCISIONAL;  Surgeon: Erroll Luna, MD;  Location: [facility];  Service: General;  Laterality: N/A;  . LAPAROSCOPIC APPENDECTOMY N/A 09/15/2015   Procedure: APPENDECTOMY LAPAROSCOPIC;  Surgeon: Judeth Horn, MD;  Location: [facility];  Service: General;  Laterality: N/A;  . LUMPS ON BACK  2013  . WISDOM TOOTH EXTRACTION      Allergies  Allergen Reactions  . Penicillins Anaphylaxis    Has patient had a PCN reaction causing immediate rash, facial/tongue/throat swelling, SOB or lightheadedness with hypotensionNO Has patient had a PCN reaction causing severe rash involving mucus membranes or skin necrosis: NO Has patient had a PCN reaction that required hospitalization NO Has patient had a PCN reaction occurring  within the last 10 years: NO If all of the above answers are "NO", then may proceed with Cephalosporin use.      Outpatient Medications Prior to Visit  Medication Sig Dispense Refill  . calcium carbonate (TUMS - DOSED IN MG ELEMENTAL CALCIUM) 500 MG chewable tablet Chew 2 tablets by mouth daily.    . Multiple Vitamin (ONE-A-DAY MENS PO) Take 2 tablets by mouth daily.    . nicotine (NICODERM CQ - DOSED IN MG/24 HOURS) 14 mg/24hr patch Place 1 patch (14 mg total) onto the skin daily. 30 patch 0  . ondansetron (ZOFRAN ODT) 4 MG disintegrating tablet Take 1 tablet (4 mg total) by mouth every 8 (eight) hours as needed for nausea or vomiting. 12 tablet 0  . busPIRone (BUSPAR) 15 MG tablet Take 1 tablet (15 mg total) by mouth 2 (two) times daily. 60 tablet 2  . cetirizine (ZYRTEC) 10 MG tablet Take 1 tablet (10 mg total) by mouth daily. 30 tablet 1  . hydrochlorothiazide (HYDRODIURIL) 25 MG tablet Take 1 tablet (25 mg total) by mouth daily. 30 tablet 3  . methocarbamol (ROBAXIN-750) 750 MG tablet Take 1 tablet (750 mg total) by mouth 2 (two) times daily as needed for muscle spasms. 60 tablet 1  . nicotine polacrilex (NICORETTE) 4 MG gum Take 1 each (4 mg total) by mouth as needed for smoking cessation. (Patient not taking: Reported on 11/02/2015) 100 tablet 0  . omeprazole (PRILOSEC) 40 MG capsule Take 1 capsule (  40 mg total) by mouth daily. (Patient not taking: Reported on 06/06/2016) 30 capsule 2  . sucralfate (CARAFATE) 1 GM/10ML suspension Take 10 mLs (1 g total) by mouth 3 (three) times daily as needed (heartburn). (Patient not taking: Reported on 06/06/2016) 420 mL 0   No facility-administered medications prior to visit.     ROS Review of Systems  Constitutional: Negative for activity change and appetite change.  HENT: Negative for sinus pressure and sore throat.   Eyes: Negative for visual disturbance.  Respiratory: Negative for cough, chest tightness and shortness of breath.     Cardiovascular: Negative for chest pain and leg swelling.  Gastrointestinal: Negative for abdominal distention, abdominal pain, constipation and diarrhea.  Endocrine: Negative.   Genitourinary: Negative for dysuria.  Musculoskeletal:       See hpi  Skin: Negative for rash.  Allergic/Immunologic: Negative.   Neurological: Negative for weakness, light-headedness and numbness.  Psychiatric/Behavioral: Negative for dysphoric mood and suicidal ideas.    Objective:  BP 129/86   Pulse 99   Temp 97.8 F (36.6 C) (Oral)   Ht '5\' 7"'  (1.702 m)   Wt 197 lb 12.8 oz (89.7 kg)   SpO2 96%   BMI 30.98 kg/m   BP/Weight 06/06/2016 04/11/2016 6/73/4193  Systolic BP 790 240 973  Diastolic BP 86 78 89  Wt. (Lbs) 197.8 201.6 200.4  BMI 30.98 31.58 31.39      Physical Exam  Constitutional: He is oriented to person, place, and time. He appears well-developed and well-nourished.  Cardiovascular: Normal rate, normal heart sounds and intact distal pulses.   No murmur heard. Pulmonary/Chest: Effort normal and breath sounds normal. He has no wheezes. He has no rales. He exhibits no tenderness.  Abdominal: Soft. Bowel sounds are normal. He exhibits no distension and no mass. There is no tenderness.  A vertical, healed, surgical scar which is supraumbilical  Musculoskeletal: He exhibits tenderness (tenderness on palpation of the anterior aspect of right shoulder; normal passive forward elevation . Passive abduction limited to 90 degrees due to tenderness). He exhibits no edema.  Lumbar spine: Normal  Neurological: He is alert and oriented to person, place, and time.     Assessment & Plan:   1. Acute right-sided low back pain without sciatica Controlled - methocarbamol (ROBAXIN-750) 750 MG tablet; Take 1 tablet (750 mg total) by mouth 2 (two) times daily as needed for muscle spasms.  Dispense: 60 tablet; Refill: 3  2. Essential hypertension Controlled Low-sodium diet - hydrochlorothiazide  (HYDRODIURIL) 25 MG tablet; Take 1 tablet (25 mg total) by mouth daily.  Dispense: 30 tablet; Refill: 3 - CMP14+EGFR; Future - Lipid panel; Future  3. Seasonal allergic rhinitis due to other allergic trigger Stable - cetirizine (ZYRTEC) 10 MG tablet; Take 1 tablet (10 mg total) by mouth daily.  Dispense: 30 tablet; Refill: 3  4. Anxiety Controlled - busPIRone (BUSPAR) 15 MG tablet; Take 1 tablet (15 mg total) by mouth 2 (two) times daily.  Dispense: 60 tablet; Refill: 3  5. Acute pain of right shoulder Secondary to trauma - predniSONE (DELTASONE) 20 MG tablet; Take 1 tablet (20 mg total) by mouth 2 (two) times daily with a meal.  Dispense: 10 tablet; Refill: 0 - ibuprofen (ADVIL,MOTRIN) 600 MG tablet; Take 1 tablet (600 mg total) by mouth every 12 (twelve) hours as needed.  Dispense: 60 tablet; Refill: 2   Meds ordered this encounter  Medications  . omeprazole (PRILOSEC) 40 MG capsule    Sig: Take 1 capsule (  40 mg total) by mouth daily.    Dispense:  30 capsule    Refill:  3  . methocarbamol (ROBAXIN-750) 750 MG tablet    Sig: Take 1 tablet (750 mg total) by mouth 2 (two) times daily as needed for muscle spasms.    Dispense:  60 tablet    Refill:  3  . hydrochlorothiazide (HYDRODIURIL) 25 MG tablet    Sig: Take 1 tablet (25 mg total) by mouth daily.    Dispense:  30 tablet    Refill:  3  . cetirizine (ZYRTEC) 10 MG tablet    Sig: Take 1 tablet (10 mg total) by mouth daily.    Dispense:  30 tablet    Refill:  3  . busPIRone (BUSPAR) 15 MG tablet    Sig: Take 1 tablet (15 mg total) by mouth 2 (two) times daily.    Dispense:  60 tablet    Refill:  3  . predniSONE (DELTASONE) 20 MG tablet    Sig: Take 1 tablet (20 mg total) by mouth 2 (two) times daily with a meal.    Dispense:  10 tablet    Refill:  0  . ibuprofen (ADVIL,MOTRIN) 600 MG tablet    Sig: Take 1 tablet (600 mg total) by mouth every 12 (twelve) hours as needed.    Dispense:  60 tablet    Refill:  2    Commence  after course of Prednisone    Follow-up: Return in about 3 months (around 09/05/2016) for Follow-up on chronic medical conditions.   Arnoldo Morale MD

## 2016-06-11 ENCOUNTER — Ambulatory Visit: Payer: BLUE CROSS/BLUE SHIELD | Attending: Family Medicine

## 2016-06-11 DIAGNOSIS — I10 Essential (primary) hypertension: Secondary | ICD-10-CM

## 2016-06-12 LAB — CMP14+EGFR
ALT: 18 IU/L (ref 0–44)
AST: 13 IU/L (ref 0–40)
Albumin/Globulin Ratio: 1.8 (ref 1.2–2.2)
Albumin: 4.2 g/dL (ref 3.5–5.5)
Alkaline Phosphatase: 87 IU/L (ref 39–117)
BUN/Creatinine Ratio: 9 (ref 9–20)
BUN: 9 mg/dL (ref 6–24)
Bilirubin Total: <0.2 mg/dL (ref 0.0–1.2)
CO2: 28 mmol/L (ref 18–29)
Calcium: 9.8 mg/dL (ref 8.7–10.2)
Chloride: 98 mmol/L (ref 96–106)
Creatinine, Ser: 1.04 mg/dL (ref 0.76–1.27)
GFR calc Af Amer: 100 mL/min/{1.73_m2} (ref >59)
GFR calc non Af Amer: 86 mL/min/{1.73_m2} (ref >59)
Globulin, Total: 2.4 g/dL (ref 1.5–4.5)
Glucose: 115 mg/dL — ABNORMAL HIGH (ref 65–99)
Potassium: 4.3 mmol/L (ref 3.5–5.2)
Sodium: 141 mmol/L (ref 134–144)
Total Protein: 6.6 g/dL (ref 6.0–8.5)

## 2016-06-12 LAB — LIPID PANEL
Chol/HDL Ratio: 3.8 ratio (ref 0.0–5.0)
Cholesterol, Total: 213 mg/dL — ABNORMAL HIGH (ref 100–199)
HDL: 56 mg/dL (ref >39)
LDL Calculated: 122 mg/dL — ABNORMAL HIGH (ref 0–99)
Triglycerides: 174 mg/dL — ABNORMAL HIGH (ref 0–149)
VLDL Cholesterol Cal: 35 mg/dL (ref 5–40)

## 2016-06-13 ENCOUNTER — Telehealth: Payer: Self-pay | Admitting: *Deleted

## 2016-06-13 NOTE — Telephone Encounter (Signed)
Pt verified  DOB  Lab results given  Cholesterol is elevated. Advise him to work on low-cholesterol diet, weight loss, exercise. Liver and kidney functions are normal.  Pt verbalized understanding.

## 2016-06-14 ENCOUNTER — Ambulatory Visit: Payer: BLUE CROSS/BLUE SHIELD | Admitting: Family Medicine

## 2016-07-06 ENCOUNTER — Telehealth: Payer: Self-pay | Admitting: Family Medicine

## 2016-07-06 DIAGNOSIS — I10 Essential (primary) hypertension: Secondary | ICD-10-CM

## 2016-07-06 MED ORDER — HYDROCHLOROTHIAZIDE 25 MG PO TABS: 25.0000 mg | ORAL_TABLET | Freq: Every day | ORAL | 3 refills | Status: DC

## 2016-07-06 NOTE — Telephone Encounter (Signed)
Patient called the office to request medication refill for his bp medication. Pt asked that we call them in to [facility] on [address].  Thank you.

## 2016-07-06 NOTE — Telephone Encounter (Signed)
Hydrochlorothiazide sent to [facility]

## 2016-09-10 ENCOUNTER — Ambulatory Visit: Payer: BLUE CROSS/BLUE SHIELD | Admitting: Family Medicine

## 2016-09-12 ENCOUNTER — Encounter: Payer: Self-pay | Admitting: Family Medicine

## 2016-09-12 ENCOUNTER — Ambulatory Visit: Payer: Self-pay | Attending: Family Medicine | Admitting: Family Medicine

## 2016-09-12 VITALS — BP 136/75 | HR 103 | Temp 98.0°F | Ht 67.0 in | Wt 207.2 lb

## 2016-09-12 DIAGNOSIS — F419 Anxiety disorder, unspecified: Secondary | ICD-10-CM | POA: Insufficient documentation

## 2016-09-12 DIAGNOSIS — I10 Essential (primary) hypertension: Secondary | ICD-10-CM | POA: Insufficient documentation

## 2016-09-12 DIAGNOSIS — K219 Gastro-esophageal reflux disease without esophagitis: Secondary | ICD-10-CM | POA: Insufficient documentation

## 2016-09-12 DIAGNOSIS — Z131 Encounter for screening for diabetes mellitus: Secondary | ICD-10-CM

## 2016-09-12 DIAGNOSIS — Z79899 Other long term (current) drug therapy: Secondary | ICD-10-CM | POA: Insufficient documentation

## 2016-09-12 DIAGNOSIS — G8929 Other chronic pain: Secondary | ICD-10-CM | POA: Insufficient documentation

## 2016-09-12 DIAGNOSIS — F329 Major depressive disorder, single episode, unspecified: Secondary | ICD-10-CM | POA: Insufficient documentation

## 2016-09-12 DIAGNOSIS — M25511 Pain in right shoulder: Secondary | ICD-10-CM | POA: Insufficient documentation

## 2016-09-12 DIAGNOSIS — Z23 Encounter for immunization: Secondary | ICD-10-CM

## 2016-09-12 LAB — POCT GLYCOSYLATED HEMOGLOBIN (HGB A1C): Hemoglobin A1C: 5.8

## 2016-09-12 MED ORDER — BUSPIRONE HCL 15 MG PO TABS: 15.0000 mg | ORAL_TABLET | Freq: Two times a day (BID) | ORAL | 3 refills | Status: DC

## 2016-09-12 MED ORDER — HYDROCHLOROTHIAZIDE 25 MG PO TABS: 25.0000 mg | ORAL_TABLET | Freq: Every day | ORAL | 3 refills | Status: DC

## 2016-09-12 MED ORDER — IBUPROFEN 600 MG PO TABS: 600.0000 mg | ORAL_TABLET | Freq: Two times a day (BID) | ORAL | 2 refills | Status: DC | PRN

## 2016-09-12 MED ORDER — TRAMADOL HCL 50 MG PO TABS: 50.0000 mg | ORAL_TABLET | Freq: Two times a day (BID) | ORAL | 0 refills | Status: DC | PRN

## 2016-09-12 MED ORDER — OMEPRAZOLE 20 MG PO CPDR: 20.0000 mg | DELAYED_RELEASE_CAPSULE | Freq: Every day | ORAL | 3 refills | Status: DC

## 2016-09-12 NOTE — Patient Instructions (Signed)
Shoulder Pain Many things can cause shoulder pain, including:  An injury to the area.  Overuse of the shoulder.  Arthritis. The source of the pain can be:  Inflammation.  An injury to the shoulder joint.  An injury to a tendon, ligament, or bone. Follow these instructions at home: Take these actions to help with your pain:  Squeeze a soft ball or a foam pad as much as possible. This helps to keep the shoulder from swelling. It also helps to strengthen the arm.  Take over-the-counter and prescription medicines only as told by your health care provider.  If directed, apply ice to the area:  Put ice in a plastic bag.  Place a towel between your skin and the bag.  Leave the ice on for 20 minutes, 2-3 times per day. Stop applying ice if it does not help with the pain.  If you were given a shoulder sling or immobilizer:  Wear it as told.  Remove it to shower or bathe.  Move your arm as little as possible, but keep your hand moving to prevent swelling. Contact a health care provider if:  Your pain gets worse.  Your pain is not relieved with medicines.  New pain develops in your arm, hand, or fingers. Get help right away if:  Your arm, hand, or fingers:  Tingle.  Become numb.  Become swollen.  Become painful.  Turn white or blue. This information is not intended to replace advice given to you by your health care provider. Make sure you discuss any questions you have with your health care provider. Document Released: 11/08/2004 Document Revised: 09/25/2015 Document Reviewed: 05/24/2014 Elsevier Interactive Patient Education  2017 Elsevier Inc.  

## 2016-09-12 NOTE — Progress Notes (Signed)
Subjective:  Patient ID: Timothy Mccarthy, male    DOB: 24-Sep-1970  Age: 46 y.o. MRN: [id number]  CC: Shoulder Pain   HPI Timothy Mccarthy  is a left-handed 46 year old male with a history of hypertension, GERD, anxiety who presents today for a follow-up visit.  He had complained of anterior right shoulder pain ever since he attempted to move a refrigerator with his brother 3 months ago; pain is still present and is worse with reaching overhead. Pain is worse at night and when he wakes up in the morning he feels his right arm is limp. He also hears a popping sound when he attempts to elevate his arm more than usual. Takes ibuprofen and Robaxin with no much relief.  He does have low back pain and has been taking Robaxin with improvement in symptoms.  His anxiety and reflux have been controlled on current medication regimen and he has been compliant with his antihypertensives. Denies chest pains or shortness of breath.  Past Medical History:  Diagnosis Date  . Adjustment disorder with anxious mood   . Anxiety   . Arthritis    "hands" (09/28/2015)  . Depression   . GERD (gastroesophageal reflux disease)   . Hypertension   . Tobacco use disorder     Past Surgical History:  Procedure Laterality Date  . APPENDECTOMY  09/15/2015  . CARPAL TUNNEL RELEASE Bilateral   . INCISIONAL HERNIA REPAIR N/A 09/29/2015   Procedure: HERNIA REPAIR INCISIONAL;  Surgeon: Erroll Luna, MD;  Location: [facility];  Service: General;  Laterality: N/A;  . LAPAROSCOPIC APPENDECTOMY N/A 09/15/2015   Procedure: APPENDECTOMY LAPAROSCOPIC;  Surgeon: Judeth Horn, MD;  Location: [facility];  Service: General;  Laterality: N/A;  . LUMPS ON BACK  2013  . WISDOM TOOTH EXTRACTION      Allergies  Allergen Reactions  . Penicillins Anaphylaxis    Has patient had a PCN reaction causing immediate rash, facial/tongue/throat swelling, SOB or lightheadedness with hypotensionNO Has patient had a PCN reaction causing severe rash  involving mucus membranes or skin necrosis: NO Has patient had a PCN reaction that required hospitalization NO Has patient had a PCN reaction occurring within the last 10 years: NO If all of the above answers are "NO", then may proceed with Cephalosporin use.     Outpatient Medications Prior to Visit  Medication Sig Dispense Refill  . calcium carbonate (TUMS - DOSED IN MG ELEMENTAL CALCIUM) 500 MG chewable tablet Chew 2 tablets by mouth daily.    . cetirizine (ZYRTEC) 10 MG tablet Take 1 tablet (10 mg total) by mouth daily. 30 tablet 3  . methocarbamol (ROBAXIN-750) 750 MG tablet Take 1 tablet (750 mg total) by mouth 2 (two) times daily as needed for muscle spasms. 60 tablet 3  . Multiple Vitamin (ONE-A-DAY MENS PO) Take 2 tablets by mouth daily.    . busPIRone (BUSPAR) 15 MG tablet Take 1 tablet (15 mg total) by mouth 2 (two) times daily. 60 tablet 3  . hydrochlorothiazide (HYDRODIURIL) 25 MG tablet Take 1 tablet (25 mg total) by mouth daily. 30 tablet 3  . ibuprofen (ADVIL,MOTRIN) 600 MG tablet Take 1 tablet (600 mg total) by mouth every 12 (twelve) hours as needed. 60 tablet 2  . omeprazole (PRILOSEC) 40 MG capsule Take 1 capsule (40 mg total) by mouth daily. 30 capsule 3  . nicotine (NICODERM CQ - DOSED IN MG/24 HOURS) 14 mg/24hr patch Place 1 patch (14 mg total) onto the skin daily. (Patient not taking:  Reported on 09/12/2016) 30 patch 0  . nicotine polacrilex (NICORETTE) 4 MG gum Take 1 each (4 mg total) by mouth as needed for smoking cessation. (Patient not taking: Reported on 11/02/2015) 100 tablet 0  . ondansetron (ZOFRAN ODT) 4 MG disintegrating tablet Take 1 tablet (4 mg total) by mouth every 8 (eight) hours as needed for nausea or vomiting. (Patient not taking: Reported on 09/12/2016) 12 tablet 0  . predniSONE (DELTASONE) 20 MG tablet Take 1 tablet (20 mg total) by mouth 2 (two) times daily with a meal. (Patient not taking: Reported on 09/12/2016) 10 tablet 0   No facility-administered  medications prior to visit.     ROS Review of Systems Constitutional: Negative for activity change and appetite change.  HENT: Negative for sinus pressure and sore throat.   Eyes: Negative for visual disturbance.  Respiratory: Negative for cough, chest tightness and shortness of breath.   Cardiovascular: Negative for chest pain and leg swelling.  Gastrointestinal: Negative for abdominal distention, abdominal pain, constipation and diarrhea.  Endocrine: Negative.   Genitourinary: Negative for dysuria.  Musculoskeletal:       See hpi  Skin: Negative for rash.  Allergic/Immunologic: Negative.   Neurological: Negative for weakness, light-headedness and numbness.  Psychiatric/Behavioral: Negative for dysphoric mood and suicidal ideas.  Objective:  BP 136/75   Pulse (!) 103   Temp 98 F (36.7 C) (Oral)   Ht 5\' 7"  (1.702 m)   Wt 207 lb 3.2 oz (94 kg)   SpO2 96%   BMI 32.45 kg/m   BP/Weight 09/12/2016 06/06/2016 5/40/9811  Systolic BP 914 782 956  Diastolic BP 75 86 78  Wt. (Lbs) 207.2 197.8 201.6  BMI 32.45 30.98 31.58      Physical Exam Constitutional: He is oriented to person, place, and time. He appears well-developed and well-nourished.  Cardiovascular: Normal rate, normal heart sounds and intact distal pulses.   No murmur heard. Pulmonary/Chest: Effort normal and breath sounds normal. He has no wheezes. He has no rales. He exhibits no tenderness.  Abdominal: Soft. Bowel sounds are normal. He exhibits no distension and no mass. There is no tenderness.  A vertical, healed, surgical scar which is supraumbilical  Musculoskeletal: He exhibits tenderness (tenderness on palpation of the anterior aspect of right shoulder; normal passive forward elevation . Passive abduction limited to 90 degrees due to tenderness). He exhibits no edema.  Lumbar spine: Normal  Neurological: He is alert and oriented to person, place, and time.    Assessment & Plan:   1. Anxiety Stable -  busPIRone (BUSPAR) 15 MG tablet; Take 1 tablet (15 mg total) by mouth 2 (two) times daily.  Dispense: 60 tablet; Refill: 3  2. Essential hypertension Controlled - hydrochlorothiazide (HYDRODIURIL) 25 MG tablet; Take 1 tablet (25 mg total) by mouth daily.  Dispense: 30 tablet; Refill: 3  3. Chronic right shoulder pain Has persisted from an unusually long time We'll proceed with imaging and x-ray is  unremarkable we'll order MRI. - ibuprofen (ADVIL,MOTRIN) 600 MG tablet; Take 1 tablet (600 mg total) by mouth every 12 (twelve) hours as needed.  Dispense: 60 tablet; Refill: 2 - DG Shoulder Right; Future  4. Screening for diabetes mellitus A1c of 5.8   Meds ordered this encounter  Medications  . busPIRone (BUSPAR) 15 MG tablet    Sig: Take 1 tablet (15 mg total) by mouth 2 (two) times daily.    Dispense:  60 tablet    Refill:  3  . hydrochlorothiazide (HYDRODIURIL)  25 MG tablet    Sig: Take 1 tablet (25 mg total) by mouth daily.    Dispense:  30 tablet    Refill:  3  . omeprazole (PRILOSEC) 20 MG capsule    Sig: Take 1 capsule (20 mg total) by mouth daily.    Dispense:  30 capsule    Refill:  3  . ibuprofen (ADVIL,MOTRIN) 600 MG tablet    Sig: Take 1 tablet (600 mg total) by mouth every 12 (twelve) hours as needed.    Dispense:  60 tablet    Refill:  2  . traMADol (ULTRAM) 50 MG tablet    Sig: Take 1 tablet (50 mg total) by mouth every 12 (twelve) hours as needed.    Dispense:  60 tablet    Refill:  0    Follow-up: Return in about 3 months (around 12/13/2016) for Follow-up on chronic medical conditions.   This note has been created with Surveyor, quantity. Any transcriptional errors are unintentional.     Arnoldo Morale MD

## 2016-09-21 ENCOUNTER — Telehealth: Payer: Self-pay | Admitting: Family Medicine

## 2016-09-21 NOTE — Telephone Encounter (Signed)
Pt. Called stating that he took his Tramadol Rx to [facility] pharmacy and was told that he needed to contact his PCP b/c they are unable to fill the prescription. Please f/u with pt.

## 2016-09-27 NOTE — Telephone Encounter (Signed)
MA unable to leave a voice message due to system being full.  !!!Please advise patient of a prior authorization being completed today for the Tramadol. Per the pharmacy at [place] patients insurance will only pay for 7 days worth and the remainder will be an out of pocket expense!!!

## 2016-10-19 ENCOUNTER — Telehealth: Payer: Self-pay | Admitting: Family Medicine

## 2016-10-19 ENCOUNTER — Encounter: Payer: Self-pay | Admitting: Pharmacist

## 2016-10-19 NOTE — Progress Notes (Signed)
Insurance information on file is incorrect.  Prior authorization for tramadol submitted through [employer] ID # [id number]  It is currently under review and we will receive a fax once the review is completed and the PA is approved or denied.

## 2016-10-19 NOTE — Telephone Encounter (Signed)
Prior authorization completed and submitted today. It is pending. We will receive a fax once it is approved/denied

## 2016-10-19 NOTE — Telephone Encounter (Signed)
Pt. Called stating that [facility] on [address] is requesting a prior authorization on Tramadol. Please f/u

## 2016-11-20 ENCOUNTER — Other Ambulatory Visit: Payer: Self-pay | Admitting: Family Medicine

## 2016-11-20 DIAGNOSIS — J3089 Other allergic rhinitis: Secondary | ICD-10-CM

## 2016-12-25 ENCOUNTER — Ambulatory Visit: Payer: BLUE CROSS/BLUE SHIELD | Attending: Family Medicine | Admitting: Family Medicine

## 2016-12-25 ENCOUNTER — Encounter: Payer: Self-pay | Admitting: Family Medicine

## 2016-12-25 VITALS — BP 146/90 | HR 104 | Temp 98.1°F | Ht 67.0 in | Wt 218.6 lb

## 2016-12-25 DIAGNOSIS — M19042 Primary osteoarthritis, left hand: Secondary | ICD-10-CM | POA: Insufficient documentation

## 2016-12-25 DIAGNOSIS — M19041 Primary osteoarthritis, right hand: Secondary | ICD-10-CM | POA: Diagnosis not present

## 2016-12-25 DIAGNOSIS — E1169 Type 2 diabetes mellitus with other specified complication: Secondary | ICD-10-CM | POA: Insufficient documentation

## 2016-12-25 DIAGNOSIS — K219 Gastro-esophageal reflux disease without esophagitis: Secondary | ICD-10-CM | POA: Diagnosis not present

## 2016-12-25 DIAGNOSIS — G8929 Other chronic pain: Secondary | ICD-10-CM

## 2016-12-25 DIAGNOSIS — Z720 Tobacco use: Secondary | ICD-10-CM | POA: Diagnosis not present

## 2016-12-25 DIAGNOSIS — F329 Major depressive disorder, single episode, unspecified: Secondary | ICD-10-CM | POA: Insufficient documentation

## 2016-12-25 DIAGNOSIS — I10 Essential (primary) hypertension: Secondary | ICD-10-CM

## 2016-12-25 DIAGNOSIS — E7800 Pure hypercholesterolemia, unspecified: Secondary | ICD-10-CM | POA: Diagnosis not present

## 2016-12-25 DIAGNOSIS — F419 Anxiety disorder, unspecified: Secondary | ICD-10-CM | POA: Diagnosis not present

## 2016-12-25 DIAGNOSIS — M25511 Pain in right shoulder: Secondary | ICD-10-CM | POA: Diagnosis not present

## 2016-12-25 DIAGNOSIS — F17200 Nicotine dependence, unspecified, uncomplicated: Secondary | ICD-10-CM

## 2016-12-25 DIAGNOSIS — Z79899 Other long term (current) drug therapy: Secondary | ICD-10-CM | POA: Insufficient documentation

## 2016-12-25 DIAGNOSIS — Z880 Allergy status to penicillin: Secondary | ICD-10-CM | POA: Diagnosis not present

## 2016-12-25 DIAGNOSIS — M25512 Pain in left shoulder: Secondary | ICD-10-CM | POA: Diagnosis not present

## 2016-12-25 DIAGNOSIS — E785 Hyperlipidemia, unspecified: Secondary | ICD-10-CM | POA: Insufficient documentation

## 2016-12-25 MED ORDER — HYDROCHLOROTHIAZIDE 25 MG PO TABS: 25.0000 mg | ORAL_TABLET | Freq: Every day | ORAL | 3 refills | Status: DC

## 2016-12-25 MED ORDER — OMEPRAZOLE 20 MG PO CPDR: 20.0000 mg | DELAYED_RELEASE_CAPSULE | Freq: Every day | ORAL | 3 refills | Status: DC

## 2016-12-25 MED ORDER — BUSPIRONE HCL 15 MG PO TABS: 15.0000 mg | ORAL_TABLET | Freq: Two times a day (BID) | ORAL | 3 refills | Status: DC

## 2016-12-25 MED ORDER — ATORVASTATIN CALCIUM 20 MG PO TABS: 20.0000 mg | ORAL_TABLET | Freq: Every day | ORAL | 3 refills | Status: DC

## 2016-12-25 MED ORDER — NICOTINE 14 MG/24HR TD PT24: 14.0000 mg | MEDICATED_PATCH | TRANSDERMAL | 2 refills | Status: DC

## 2016-12-25 MED ORDER — MELOXICAM 7.5 MG PO TABS: 7.5000 mg | ORAL_TABLET | Freq: Every day | ORAL | 2 refills | Status: DC

## 2016-12-25 NOTE — Progress Notes (Signed)
Subjective:  Patient ID: Timothy Mccarthy, male    DOB: Jun 27, 1970  Age: 46 y.o. MRN: [id number]  CC: Hypertension   HPI Timothy Mccarthy is a left-handed 46 year old male with a history of hypertension, GERD, anxiety who presents today for a follow-up visit.  He ran out of his antihypertensive hence his elevated blood pressure. His anxiety has been controlled on BuSpar. Review of his chart indicates hospitalization for opioid overdose in 09/2016 at which time he received Narcan at [facility]  Notes indicate that he had reported being stressed and his mom being sick which led to him ingesting some cocaine in addition to some opiate pills. He denies depression, suicidal ideation or intent at this time.  With regards to his GERD he has not been taking his PPI and complains of soreness in his stomach for the last one and a half months with associated reflux and excessive gas.  He endorses eating lately due to his work schedule and going to sleep shortly after.  He continues to have bilateral shoulder pain and informs me he received cortisone injections in his shoulders in the past.  Pain is associated with slightly decreased range of motion but he denies swelling of the joint or dropping things from his hand; pain is described as moderate. His last visit he had reported straining his right shoulder while attempting to move a refrigerator.  He currently does not have anything for pain.    Past Medical History:  Diagnosis Date  . Adjustment disorder with anxious mood   . Anxiety   . Arthritis    "hands" (09/28/2015)  . Depression   . GERD (gastroesophageal reflux disease)   . Hypertension   . Tobacco use disorder     Past Surgical History:  Procedure Laterality Date  . APPENDECTOMY  09/15/2015  . CARPAL TUNNEL RELEASE Bilateral   . INCISIONAL HERNIA REPAIR N/A 09/29/2015   Procedure: HERNIA REPAIR INCISIONAL;  Surgeon: Erroll Luna, MD;  Location: [place];  Service:  General;  Laterality: N/A;  . LAPAROSCOPIC APPENDECTOMY N/A 09/15/2015   Procedure: APPENDECTOMY LAPAROSCOPIC;  Surgeon: Judeth Horn, MD;  Location: [place];  Service: General;  Laterality: N/A;  . LUMPS ON BACK  2013  . WISDOM TOOTH EXTRACTION      Allergies  Allergen Reactions  . Penicillins Anaphylaxis    Has patient had a PCN reaction causing immediate rash, facial/tongue/throat swelling, SOB or lightheadedness with hypotensionNO Has patient had a PCN reaction causing severe rash involving mucus membranes or skin necrosis: NO Has patient had a PCN reaction that required hospitalization NO Has patient had a PCN reaction occurring within the last 10 years: NO If all of the above answers are "NO", then may proceed with Cephalosporin use.     Outpatient Medications Prior to Visit  Medication Sig Dispense Refill  . calcium carbonate (TUMS - DOSED IN MG ELEMENTAL CALCIUM) 500 MG chewable tablet Chew 2 tablets by mouth daily.    . cetirizine (ZYRTEC) 10 MG tablet TAKE 1 TABLET BY MOUTH DAILY 30 tablet 3  . Multiple Vitamin (ONE-A-DAY MENS PO) Take 2 tablets by mouth daily.    . busPIRone (BUSPAR) 15 MG tablet Take 1 tablet (15 mg total) by mouth 2 (two) times daily. 60 tablet 3  . hydrochlorothiazide (HYDRODIURIL) 25 MG tablet Take 1 tablet (25 mg total) by mouth daily. 30 tablet 3  . ibuprofen (ADVIL,MOTRIN) 600 MG tablet Take 1 tablet (600 mg total) by mouth every 12 (  twelve) hours as needed. 60 tablet 2  . methocarbamol (ROBAXIN-750) 750 MG tablet Take 1 tablet (750 mg total) by mouth 2 (two) times daily as needed for muscle spasms. 60 tablet 3  . traMADol (ULTRAM) 50 MG tablet Take 1 tablet (50 mg total) by mouth every 12 (twelve) hours as needed. 60 tablet 0  . nicotine polacrilex (NICORETTE) 4 MG gum Take 1 each (4 mg total) by mouth as needed for smoking cessation. (Patient not taking: Reported on 11/02/2015) 100 tablet 0  . nicotine (NICODERM CQ - DOSED IN MG/24 HOURS) 14 mg/24hr patch  Place 1 patch (14 mg total) onto the skin daily. (Patient not taking: Reported on 09/12/2016) 30 patch 0  . omeprazole (PRILOSEC) 20 MG capsule Take 1 capsule (20 mg total) by mouth daily. (Patient not taking: Reported on 12/25/2016) 30 capsule 3   No facility-administered medications prior to visit.     ROS Review of Systems  Constitutional: Negative for activity change and appetite change.  HENT: Negative for sinus pressure and sore throat.   Eyes: Negative for visual disturbance.  Respiratory: Negative for cough, chest tightness and shortness of breath.   Cardiovascular: Negative for chest pain and leg swelling.  Gastrointestinal: Negative for abdominal distention, abdominal pain, constipation and diarrhea.  Endocrine: Negative.   Genitourinary: Negative for dysuria.  Musculoskeletal:       See hpi  Skin: Negative for rash.  Allergic/Immunologic: Negative.   Neurological: Negative for weakness, light-headedness and numbness.  Psychiatric/Behavioral: Negative for dysphoric mood and suicidal ideas.    Objective:  BP (!) 146/90   Pulse (!) 104   Temp 98.1 F (36.7 C) (Oral)   Ht 5\' 7"  (1.702 m)   Wt 218 lb 9.6 oz (99.2 kg)   SpO2 97%   BMI 34.24 kg/m   BP/Weight 12/25/2016 09/12/2016 2/72/5366  Systolic BP 440 347 425  Diastolic BP 90 75 86  Wt. (Lbs) 218.6 207.2 197.8  BMI 34.24 32.45 30.98      Physical Exam  Constitutional: He is oriented to person, place, and time. He appears well-developed and well-nourished.  Cardiovascular: Normal rate, normal heart sounds and intact distal pulses.  No murmur heard. Pulmonary/Chest: Effort normal and breath sounds normal. He has no wheezes. He has no rales. He exhibits no tenderness.  Abdominal: Soft. Bowel sounds are normal. He exhibits no distension and no mass. There is no tenderness.  Musculoskeletal:       Right shoulder: He exhibits decreased range of motion. He exhibits no swelling and no crepitus.       Left shoulder: He  exhibits decreased range of motion. He exhibits no swelling and no crepitus.  Neurological: He is alert and oriented to person, place, and time.  Skin: Skin is warm and dry.  Psychiatric: He has a normal mood and affect.     CMP Latest Ref Rng & Units 06/11/2016 03/14/2016 03/03/2016  Glucose 65 - 99 mg/dL 115(H) 89 -  BUN 6 - 24 mg/dL 9 11 -  Creatinine 0.76 - 1.27 mg/dL 1.04 1.36(H) -  Sodium 134 - 144 mmol/L 141 137 -  Potassium 3.5 - 5.2 mmol/L 4.3 3.9 -  Chloride 96 - 106 mmol/L 98 100 -  CO2 18 - 29 mmol/L 28 26 -  Calcium 8.7 - 10.2 mg/dL 9.8 9.3 -  Total Protein 6.0 - 8.5 g/dL 6.6 - 7.7  Total Bilirubin 0.0 - 1.2 mg/dL <0.2 - 1.1  Alkaline Phos 39 - 117 IU/L 87 -  84  AST 0 - 40 IU/L 13 - 43(H)  ALT 0 - 44 IU/L 18 - 26    Lipid Panel     Component Value Date/Time   CHOL 213 (H) 06/11/2016 0911   TRIG 174 (H) 06/11/2016 0911   HDL 56 06/11/2016 0911   CHOLHDL 3.8 06/11/2016 0911   LDLCALC 122 (H) 06/11/2016 0911    The 10-year ASCVD risk score Mikey Bussing DC Jr., et al., 2013) is: 14.4%   Values used to calculate the score:     Age: 53 years     Sex: Male     Is Non-Hispanic African American: Yes     Diabetic: No     Tobacco smoker: Yes     Systolic Blood Pressure: 161 mmHg     Is BP treated: Yes     HDL Cholesterol: 56 mg/dL     Total Cholesterol: 213 mg/dL  Assessment & Plan:   1. Anxiety Stable - busPIRone (BUSPAR) 15 MG tablet; Take 1 tablet (15 mg total) 2 (two) times daily by mouth.  Dispense: 60 tablet; Refill: 3  2. Essential hypertension Uncontrolled due to being out of medications which I have refilled - hydrochlorothiazide (HYDRODIURIL) 25 MG tablet; Take 1 tablet (25 mg total) daily by mouth.  Dispense: 30 tablet; Refill: 3  3. Chronic pain of both shoulders Secondary to underlying osteoarthritis Status post cortisone injections in the past - meloxicam (MOBIC) 7.5 MG tablet; Take 1 tablet (7.5 mg total) daily by mouth.  Dispense: 30 tablet; Refill:  2 - DG Shoulder Left; Future - DG Shoulder Right; Future  4. Tobacco use disorder Smoking cessation support: smoking cessation hotline: 1-800-QUIT-NOW.  Smoking cessation classes are available through [facility]. Call [phone] or visit our website at [email or web address]  Spent 3 minutes counseling on smoking cessation and patient is not ready to quit.  - nicotine (NICODERM CQ - DOSED IN MG/24 HOURS) 14 mg/24hr patch; Place 1 patch (14 mg total) daily onto the skin.  Dispense: 30 patch; Refill: 2  5. Pure hypercholesterolemia ASCVD risk of 14.4% Commenced on statin given history of smoking Low-cholesterol diet - atorvastatin (LIPITOR) 20 MG tablet; Take 1 tablet (20 mg total) daily by mouth.  Dispense: 30 tablet; Refill: 3  6. Gastroesophageal reflux disease, esophagitis presence not specified Uncontrolled Advised to resume his PPI Avoid late meals, acidic foods that trigger gout - omeprazole (PRILOSEC) 20 MG capsule; Take 1 capsule (20 mg total) daily by mouth.  Dispense: 30 capsule; Refill: 3   Meds ordered this encounter  Medications  . omeprazole (PRILOSEC) 20 MG capsule    Sig: Take 1 capsule (20 mg total) daily by mouth.    Dispense:  30 capsule    Refill:  3  . busPIRone (BUSPAR) 15 MG tablet    Sig: Take 1 tablet (15 mg total) 2 (two) times daily by mouth.    Dispense:  60 tablet    Refill:  3  . hydrochlorothiazide (HYDRODIURIL) 25 MG tablet    Sig: Take 1 tablet (25 mg total) daily by mouth.    Dispense:  30 tablet    Refill:  3  . nicotine (NICODERM CQ - DOSED IN MG/24 HOURS) 14 mg/24hr patch    Sig: Place 1 patch (14 mg total) daily onto the skin.    Dispense:  30 patch    Refill:  2  . atorvastatin (LIPITOR) 20 MG tablet    Sig: Take 1 tablet (20 mg  total) daily by mouth.    Dispense:  30 tablet    Refill:  3  . meloxicam (MOBIC) 7.5 MG tablet    Sig: Take 1 tablet (7.5 mg total) daily by mouth.    Dispense:  30 tablet     Refill:  2    Follow-up: Return in about 3 months (around 03/27/2017) for follow up of Hypertension and shoulder pain.   Arnoldo Morale MD

## 2016-12-25 NOTE — Patient Instructions (Signed)
Shoulder Pain Many things can cause shoulder pain, including:  An injury.  Moving the arm in the same way again and again (overuse).  Joint pain (arthritis).  Follow these instructions at home: Take these actions to help with your pain:  Squeeze a soft ball or a foam pad as much as you can. This helps to prevent swelling. It also makes the arm stronger.  Take over-the-counter and prescription medicines only as told by your doctor.  If told, put ice on the area: ? Put ice in a plastic bag. ? Place a towel between your skin and the bag. ? Leave the ice on for 20 minutes, 2-3 times per day. Stop putting on ice if it does not help with the pain.  If you were given a shoulder sling or immobilizer: ? Wear it as told. ? Remove it to shower or bathe. ? Move your arm as little as possible. ? Keep your hand moving. This helps prevent swelling.  Contact a doctor if:  Your pain gets worse.  Medicine does not help your pain.  You have new pain in your arm, hand, or fingers. Get help right away if:  Your arm, hand, or fingers: ? Tingle. ? Are numb. ? Are swollen. ? Are painful. ? Turn white or blue. This information is not intended to replace advice given to you by your health care provider. Make sure you discuss any questions you have with your health care provider. Document Released: 07/18/2007 Document Revised: 09/25/2015 Document Reviewed: 05/24/2014 Elsevier Interactive Patient Education  2018 [employer] Inc.  

## 2016-12-27 ENCOUNTER — Encounter: Payer: Self-pay | Admitting: Family Medicine

## 2017-01-20 ENCOUNTER — Other Ambulatory Visit: Payer: Self-pay | Admitting: Family Medicine

## 2017-03-27 ENCOUNTER — Encounter: Payer: Self-pay | Admitting: Family Medicine

## 2017-03-27 ENCOUNTER — Ambulatory Visit: Payer: BLUE CROSS/BLUE SHIELD | Attending: Family Medicine | Admitting: Family Medicine

## 2017-03-27 VITALS — BP 132/90 | HR 93 | Temp 97.9°F | Ht 67.0 in | Wt 221.0 lb

## 2017-03-27 DIAGNOSIS — M25561 Pain in right knee: Secondary | ICD-10-CM | POA: Diagnosis not present

## 2017-03-27 DIAGNOSIS — M25511 Pain in right shoulder: Secondary | ICD-10-CM | POA: Insufficient documentation

## 2017-03-27 DIAGNOSIS — E7800 Pure hypercholesterolemia, unspecified: Secondary | ICD-10-CM | POA: Insufficient documentation

## 2017-03-27 DIAGNOSIS — Z880 Allergy status to penicillin: Secondary | ICD-10-CM | POA: Insufficient documentation

## 2017-03-27 DIAGNOSIS — Z79899 Other long term (current) drug therapy: Secondary | ICD-10-CM | POA: Insufficient documentation

## 2017-03-27 DIAGNOSIS — I10 Essential (primary) hypertension: Secondary | ICD-10-CM | POA: Diagnosis not present

## 2017-03-27 DIAGNOSIS — F17200 Nicotine dependence, unspecified, uncomplicated: Secondary | ICD-10-CM | POA: Diagnosis not present

## 2017-03-27 DIAGNOSIS — G8929 Other chronic pain: Secondary | ICD-10-CM | POA: Diagnosis not present

## 2017-03-27 DIAGNOSIS — M25562 Pain in left knee: Secondary | ICD-10-CM | POA: Diagnosis not present

## 2017-03-27 DIAGNOSIS — K219 Gastro-esophageal reflux disease without esophagitis: Secondary | ICD-10-CM | POA: Insufficient documentation

## 2017-03-27 DIAGNOSIS — F329 Major depressive disorder, single episode, unspecified: Secondary | ICD-10-CM | POA: Diagnosis not present

## 2017-03-27 DIAGNOSIS — M25512 Pain in left shoulder: Secondary | ICD-10-CM | POA: Insufficient documentation

## 2017-03-27 DIAGNOSIS — F4322 Adjustment disorder with anxiety: Secondary | ICD-10-CM | POA: Insufficient documentation

## 2017-03-27 DIAGNOSIS — M79672 Pain in left foot: Secondary | ICD-10-CM | POA: Diagnosis not present

## 2017-03-27 DIAGNOSIS — M79671 Pain in right foot: Secondary | ICD-10-CM | POA: Diagnosis not present

## 2017-03-27 DIAGNOSIS — Z791 Long term (current) use of non-steroidal anti-inflammatories (NSAID): Secondary | ICD-10-CM | POA: Diagnosis not present

## 2017-03-27 DIAGNOSIS — F419 Anxiety disorder, unspecified: Secondary | ICD-10-CM

## 2017-03-27 MED ORDER — BUSPIRONE HCL 15 MG PO TABS: 15.0000 mg | ORAL_TABLET | Freq: Two times a day (BID) | ORAL | 3 refills | Status: DC

## 2017-03-27 MED ORDER — GABAPENTIN 300 MG PO CAPS: 300.0000 mg | ORAL_CAPSULE | Freq: Every day | ORAL | 1 refills | Status: DC

## 2017-03-27 MED ORDER — MELOXICAM 7.5 MG PO TABS: 7.5000 mg | ORAL_TABLET | Freq: Every day | ORAL | 2 refills | Status: DC

## 2017-03-27 MED ORDER — PREDNISONE 20 MG PO TABS: 20.0000 mg | ORAL_TABLET | Freq: Every day | ORAL | 0 refills | Status: DC

## 2017-03-27 MED ORDER — OMEPRAZOLE 20 MG PO CPDR: 20.0000 mg | DELAYED_RELEASE_CAPSULE | Freq: Every day | ORAL | 3 refills | Status: DC

## 2017-03-27 MED ORDER — HYDROCHLOROTHIAZIDE 25 MG PO TABS: 25.0000 mg | ORAL_TABLET | Freq: Every day | ORAL | 3 refills | Status: DC

## 2017-03-27 MED ORDER — ATORVASTATIN CALCIUM 20 MG PO TABS: 20.0000 mg | ORAL_TABLET | Freq: Every day | ORAL | 3 refills | Status: DC

## 2017-03-27 NOTE — Patient Instructions (Signed)

## 2017-03-27 NOTE — Progress Notes (Signed)
Subjective:  Patient ID: Timothy Mccarthy, male    DOB: 02-22-1970  Age: 47 y.o. MRN: [id number]  CC: Hypertension and Shoulder Pain   HPI Timothy Mccarthy  is a left-handed 47 year old male with a history of hypertension, GERD, anxiety who presents today for a follow-up visit.  He complains of bilateral knee pain for the last 2 months which is rated at a 7/10 at this time and has worsened with the cold weather.  At his job he is constantly climbing ladders and climbing four flights of stairs. He also has pain in his feet which he describes as "feeling hot" and numb at the end of the day and he has had to wake up at night to go put his feet in cold water to relieve symptoms. He has chronic bilateral shoulder pains and has had corticosteroid injections in the past but today the pain in his shoulder is not as bad. He has gained 24 pounds in the last 1 year and does not exercise. He has been compliant with his antihypertensive and denies adverse effect from his medications. Anxiety is controlled on buspirone he denies reflux symptoms today.  Past Medical History:  Diagnosis Date  . Adjustment disorder with anxious mood   . Anxiety   . Arthritis    "hands" (09/28/2015)  . Depression   . GERD (gastroesophageal reflux disease)   . Hypertension   . Tobacco use disorder     Past Surgical History:  Procedure Laterality Date  . APPENDECTOMY  09/15/2015  . CARPAL TUNNEL RELEASE Bilateral   . INCISIONAL HERNIA REPAIR N/A 09/29/2015   Procedure: HERNIA REPAIR INCISIONAL;  Surgeon: Erroll Luna, MD;  Location: [place];  Service: General;  Laterality: N/A;  . LAPAROSCOPIC APPENDECTOMY N/A 09/15/2015   Procedure: APPENDECTOMY LAPAROSCOPIC;  Surgeon: Judeth Horn, MD;  Location: [place];  Service: General;  Laterality: N/A;  . LUMPS ON BACK  2013  . WISDOM TOOTH EXTRACTION      Allergies  Allergen Reactions  . Penicillins Anaphylaxis    Has patient had a PCN reaction causing immediate rash,  facial/tongue/throat swelling, SOB or lightheadedness with hypotensionNO Has patient had a PCN reaction causing severe rash involving mucus membranes or skin necrosis: NO Has patient had a PCN reaction that required hospitalization NO Has patient had a PCN reaction occurring within the last 10 years: NO If all of the above answers are "NO", then may proceed with Cephalosporin use.     Outpatient Medications Prior to Visit  Medication Sig Dispense Refill  . calcium carbonate (TUMS - DOSED IN MG ELEMENTAL CALCIUM) 500 MG chewable tablet Chew 2 tablets by mouth daily.    . cetirizine (ZYRTEC) 10 MG tablet TAKE 1 TABLET BY MOUTH DAILY 30 tablet 3  . Multiple Vitamin (ONE-A-DAY MENS PO) Take 2 tablets by mouth daily.    Marland Kitchen atorvastatin (LIPITOR) 20 MG tablet Take 1 tablet (20 mg total) daily by mouth. 30 tablet 3  . busPIRone (BUSPAR) 15 MG tablet Take 1 tablet (15 mg total) 2 (two) times daily by mouth. 60 tablet 3  . hydrochlorothiazide (HYDRODIURIL) 25 MG tablet Take 1 tablet (25 mg total) daily by mouth. 30 tablet 3  . meloxicam (MOBIC) 7.5 MG tablet Take 1 tablet (7.5 mg total) daily by mouth. 30 tablet 2  . omeprazole (PRILOSEC) 20 MG capsule Take 1 capsule (20 mg total) daily by mouth. 30 capsule 3  . nicotine (NICODERM CQ - DOSED IN MG/24 HOURS) 14 mg/24hr patch  Place 1 patch (14 mg total) daily onto the skin. (Patient not taking: Reported on 03/27/2017) 30 patch 2  . nicotine polacrilex (NICORETTE) 4 MG gum Take 1 each (4 mg total) by mouth as needed for smoking cessation. (Patient not taking: Reported on 11/02/2015) 100 tablet 0   No facility-administered medications prior to visit.     ROS Review of Systems  Constitutional: Negative for activity change and appetite change.  HENT: Negative for sinus pressure and sore throat.   Eyes: Negative for visual disturbance.  Respiratory: Negative for cough, chest tightness and shortness of breath.   Cardiovascular: Negative for chest pain and  leg swelling.  Gastrointestinal: Negative for abdominal distention, abdominal pain, constipation and diarrhea.  Endocrine: Negative.   Genitourinary: Negative for dysuria.  Musculoskeletal:       See hpi  Skin: Negative for rash.  Allergic/Immunologic: Negative.   Neurological: Negative for weakness, light-headedness and numbness.  Psychiatric/Behavioral: Negative for dysphoric mood and suicidal ideas.    Objective:  BP 132/90   Pulse 93   Temp 97.9 F (36.6 C) (Oral)   Ht _0  (1.702 m)   Wt 221 lb (100.2 kg)   SpO2 97%   BMI 34.61 kg/m   BP/Weight 03/27/2017 33/82/5053 10/19/6732  Systolic BP 193 790 240  Diastolic BP 90 90 75  Wt. (Lbs) 221 218.6 207.2  BMI 34.61 34.24 32.45      Physical Exam  Constitutional: He is oriented to person, place, and time. He appears well-developed and well-nourished.  Cardiovascular: Normal rate, normal heart sounds and intact distal pulses.  No murmur heard. Pulmonary/Chest: Effort normal and breath sounds normal. He has no wheezes. He has no rales. He exhibits no tenderness.  Abdominal: Soft. Bowel sounds are normal. He exhibits no distension and no mass. There is no tenderness.  Musculoskeletal:  Bilateral knee crepitus and tenderness on range of motion No tenderness elicited on palpation of the heel or sole of the foot  Neurological: He is alert and oriented to person, place, and time.  Skin: Skin is warm and dry.  Psychiatric: He has a normal mood and affect.     CMP Latest Ref Rng & Units 03/27/2017 06/11/2016 03/14/2016  Glucose 65 - 99 mg/dL 92 115(H) 89  BUN 6 - 24 mg/dL _1 Creatinine 0.76 - 1.27 mg/dL 1.22 1.04 1.36(H)  Sodium 134 - 144 mmol/L 143 141 137  Potassium 3.5 - 5.2 mmol/L 3.9 4.3 3.9  Chloride 96 - 106 mmol/L 104 98 100  CO2 20 - 29 mmol/L _2 Calcium 8.7 - 10.2 mg/dL 9.7 9.8 9.3  Total Protein 6.0 - 8.5 g/dL 7.1 6.6 -  Total Bilirubin 0.0 - 1.2 mg/dL <0.2 <0.2 -  Alkaline Phos 39 - 117 IU/L 103 87  -  AST 0 - 40 IU/L 28 13 -  ALT 0 - 44 IU/L 39 18 -    Lipid Panel     Component Value Date/Time   CHOL 213 (H) 06/11/2016 0911   TRIG 174 (H) 06/11/2016 0911   HDL 56 06/11/2016 0911   CHOLHDL 3.8 06/11/2016 0911   LDLCALC 122 (H) 06/11/2016 0911     Assessment & Plan:   1. Gastroesophageal reflux disease, esophagitis presence not specified Controlled - omeprazole (PRILOSEC) 20 MG capsule; Take 1 capsule (20 mg total) by mouth daily.  Dispense: 30 capsule; Refill: 3  2. Chronic pain of both knees Due to frequent climbing of ladders and going up  and down stairs Suspicious for underlying osteoarthritis Gain of 24 pounds in the last 1 year he has not helped with symptoms-advised that weight loss will be beneficial - DG Knee Complete 4 Views Left; Future - DG Knee Complete 4 Views Right; Future - meloxicam (MOBIC) 7.5 MG tablet; Take 1 tablet (7.5 mg total) by mouth daily.  Dispense: 30 tablet; Refill: 2  3. Essential hypertension Controlled Low sodium diet - CMP14+EGFR - hydrochlorothiazide (HYDRODIURIL) 25 MG tablet; Take 1 tablet (25 mg total) by mouth daily.  Dispense: 30 tablet; Refill: 3  4. Anxiety Controlled - busPIRone (BUSPAR) 15 MG tablet; Take 1 tablet (15 mg total) by mouth 2 (two) times daily.  Dispense: 60 tablet; Refill: 3  5. Pure hypercholesterolemia Uncontrolled Low-cholesterol diet - atorvastatin (LIPITOR) 20 MG tablet; Take 1 tablet (20 mg total) by mouth daily.  Dispense: 30 tablet; Refill: 3  6. Pain in both feet We will screen for diabetes Symptoms are suspicious for plantar fasciitis but given presence of paresthesias I will place him on gabapentin as well and sedating side effects have been discussed-he knows to take this at night. Advised to use insoles. - Vitamin B12 - Hemoglobin A1c - Uric Acid   Meds ordered this encounter  Medications  . predniSONE (DELTASONE) 20 MG tablet    Sig: Take 1 tablet (20 mg total) by mouth daily with  breakfast.    Dispense:  5 tablet    Refill:  0  . gabapentin (NEURONTIN) 300 MG capsule    Sig: Take 1 capsule (300 mg total) by mouth at bedtime.    Dispense:  30 capsule    Refill:  1  . omeprazole (PRILOSEC) 20 MG capsule    Sig: Take 1 capsule (20 mg total) by mouth daily.    Dispense:  30 capsule    Refill:  3  . meloxicam (MOBIC) 7.5 MG tablet    Sig: Take 1 tablet (7.5 mg total) by mouth daily.    Dispense:  30 tablet    Refill:  2  . hydrochlorothiazide (HYDRODIURIL) 25 MG tablet    Sig: Take 1 tablet (25 mg total) by mouth daily.    Dispense:  30 tablet    Refill:  3  . busPIRone (BUSPAR) 15 MG tablet    Sig: Take 1 tablet (15 mg total) by mouth 2 (two) times daily.    Dispense:  60 tablet    Refill:  3  . atorvastatin (LIPITOR) 20 MG tablet    Sig: Take 1 tablet (20 mg total) by mouth daily.    Dispense:  30 tablet    Refill:  3    Follow-up: Return in about 3 months (around 06/24/2017) for Follow-up of chronic medical conditions.   Charlott Rakes MD

## 2017-03-28 ENCOUNTER — Encounter: Payer: Self-pay | Admitting: Family Medicine

## 2017-03-28 ENCOUNTER — Telehealth: Payer: Self-pay

## 2017-03-28 LAB — CMP14+EGFR
ALT: 39 IU/L (ref 0–44)
AST: 28 IU/L (ref 0–40)
Albumin/Globulin Ratio: 1.6 (ref 1.2–2.2)
Albumin: 4.4 g/dL (ref 3.5–5.5)
Alkaline Phosphatase: 103 IU/L (ref 39–117)
BUN/Creatinine Ratio: 8 — ABNORMAL LOW (ref 9–20)
BUN: 10 mg/dL (ref 6–24)
Bilirubin Total: <0.2 mg/dL (ref 0.0–1.2)
CO2: 24 mmol/L (ref 20–29)
Calcium: 9.7 mg/dL (ref 8.7–10.2)
Chloride: 104 mmol/L (ref 96–106)
Creatinine, Ser: 1.22 mg/dL (ref 0.76–1.27)
GFR calc Af Amer: 82 mL/min/{1.73_m2} (ref >59)
GFR calc non Af Amer: 71 mL/min/{1.73_m2} (ref >59)
Globulin, Total: 2.7 g/dL (ref 1.5–4.5)
Glucose: 92 mg/dL (ref 65–99)
Potassium: 3.9 mmol/L (ref 3.5–5.2)
Sodium: 143 mmol/L (ref 134–144)
Total Protein: 7.1 g/dL (ref 6.0–8.5)

## 2017-03-28 LAB — VITAMIN B12: Vitamin B-12: >2000 pg/mL — ABNORMAL HIGH (ref 232–1245)

## 2017-03-28 LAB — URIC ACID: Uric Acid: 6.7 mg/dL (ref 3.7–8.6)

## 2017-03-28 LAB — HEMOGLOBIN A1C
Est. average glucose Bld gHb Est-mCnc: 126 mg/dL
Hgb A1c MFr Bld: 6 % — ABNORMAL HIGH (ref 4.8–5.6)

## 2017-03-28 NOTE — Telephone Encounter (Signed)
Patient was called and informed of lab results. 

## 2017-04-23 ENCOUNTER — Other Ambulatory Visit: Payer: Self-pay | Admitting: Family Medicine

## 2017-04-23 DIAGNOSIS — J3089 Other allergic rhinitis: Secondary | ICD-10-CM

## 2017-06-24 ENCOUNTER — Ambulatory Visit: Payer: BLUE CROSS/BLUE SHIELD | Attending: Family Medicine | Admitting: Family Medicine

## 2017-06-24 ENCOUNTER — Encounter: Payer: Self-pay | Admitting: Family Medicine

## 2017-06-24 VITALS — BP 128/89 | HR 110 | Temp 97.9°F | Ht 67.0 in | Wt 212.8 lb

## 2017-06-24 DIAGNOSIS — F419 Anxiety disorder, unspecified: Secondary | ICD-10-CM

## 2017-06-24 DIAGNOSIS — E7800 Pure hypercholesterolemia, unspecified: Secondary | ICD-10-CM | POA: Diagnosis not present

## 2017-06-24 DIAGNOSIS — I10 Essential (primary) hypertension: Secondary | ICD-10-CM | POA: Diagnosis not present

## 2017-06-24 DIAGNOSIS — R202 Paresthesia of skin: Secondary | ICD-10-CM

## 2017-06-24 DIAGNOSIS — J3089 Other allergic rhinitis: Secondary | ICD-10-CM

## 2017-06-24 DIAGNOSIS — M25562 Pain in left knee: Secondary | ICD-10-CM | POA: Diagnosis not present

## 2017-06-24 DIAGNOSIS — J302 Other seasonal allergic rhinitis: Secondary | ICD-10-CM | POA: Diagnosis not present

## 2017-06-24 DIAGNOSIS — M25561 Pain in right knee: Secondary | ICD-10-CM

## 2017-06-24 DIAGNOSIS — Z79899 Other long term (current) drug therapy: Secondary | ICD-10-CM | POA: Insufficient documentation

## 2017-06-24 DIAGNOSIS — G8929 Other chronic pain: Secondary | ICD-10-CM

## 2017-06-24 DIAGNOSIS — K219 Gastro-esophageal reflux disease without esophagitis: Secondary | ICD-10-CM | POA: Diagnosis not present

## 2017-06-24 DIAGNOSIS — F329 Major depressive disorder, single episode, unspecified: Secondary | ICD-10-CM | POA: Insufficient documentation

## 2017-06-24 MED ORDER — BUSPIRONE HCL 15 MG PO TABS: 15.0000 mg | ORAL_TABLET | Freq: Two times a day (BID) | ORAL | 3 refills | Status: DC

## 2017-06-24 MED ORDER — MELOXICAM 7.5 MG PO TABS: 7.5000 mg | ORAL_TABLET | Freq: Every day | ORAL | 3 refills | Status: DC

## 2017-06-24 MED ORDER — OMEPRAZOLE 20 MG PO CPDR: 20.0000 mg | DELAYED_RELEASE_CAPSULE | Freq: Every day | ORAL | 3 refills | Status: DC

## 2017-06-24 MED ORDER — HYDROCHLOROTHIAZIDE 25 MG PO TABS: 25.0000 mg | ORAL_TABLET | Freq: Every day | ORAL | 3 refills | Status: DC

## 2017-06-24 MED ORDER — GABAPENTIN 300 MG PO CAPS: 300.0000 mg | ORAL_CAPSULE | Freq: Every day | ORAL | 3 refills | Status: DC

## 2017-06-24 MED ORDER — ATORVASTATIN CALCIUM 20 MG PO TABS: 20.0000 mg | ORAL_TABLET | Freq: Every day | ORAL | 3 refills | Status: DC

## 2017-06-24 MED ORDER — CETIRIZINE HCL 10 MG PO TABS: 10.0000 mg | ORAL_TABLET | Freq: Every day | ORAL | 3 refills | Status: DC

## 2017-06-24 NOTE — Patient Instructions (Signed)

## 2017-06-24 NOTE — Progress Notes (Signed)
Subjective:  Patient ID: Timothy Mccarthy, male    DOB: Jun 23, 1970  Age: 47 y.o. MRN: [id number]  CC: Hypertension   HPI Timothy Mccarthy is a 47 year old male with a history of hypertension, GERD, anxiety who presents today for a follow-up visit. At his last office visit he had complained about bilateral knee pain and burning in his feet for which he had been placed on meloxicam and gabapentin and reports significant improvement in his symptoms.  He also switched jobs from [employer] where he was always climbing ladders and is now at [employer] which is  more laid back and he thinks that has also contributed to his improvement.  He also wears insoles in his shoes. He is doing well on his antihypertensive and denies adverse effects from his medications. Anxiety symptoms have been controlled and he has not had any flareup of his GERD. Since his last visit 3 months ago, he has lost 9 pounds and has been commended. He is requesting refills of his chronic medications and has no additional concerns today.  Past Medical History:  Diagnosis Date  . Adjustment disorder with anxious mood   . Anxiety   . Arthritis    "hands" (09/28/2015)  . Depression   . GERD (gastroesophageal reflux disease)   . Hypertension   . Tobacco use disorder     Past Surgical History:  Procedure Laterality Date  . APPENDECTOMY  09/15/2015  . CARPAL TUNNEL RELEASE Bilateral   . INCISIONAL HERNIA REPAIR N/A 09/29/2015   Procedure: HERNIA REPAIR INCISIONAL;  Surgeon: Erroll Luna, MD;  Location: [facility];  Service: General;  Laterality: N/A;  . LAPAROSCOPIC APPENDECTOMY N/A 09/15/2015   Procedure: APPENDECTOMY LAPAROSCOPIC;  Surgeon: Judeth Horn, MD;  Location: [facility];  Service: General;  Laterality: N/A;  . LUMPS ON BACK  2013  . WISDOM TOOTH EXTRACTION      Allergies  Allergen Reactions  . Penicillins Anaphylaxis    Has patient had a PCN reaction causing immediate rash, facial/tongue/throat swelling, SOB or lightheadedness with  hypotensionNO Has patient had a PCN reaction causing severe rash involving mucus membranes or skin necrosis: NO Has patient had a PCN reaction that required hospitalization NO Has patient had a PCN reaction occurring within the last 10 years: NO If all of the above answers are "NO", then may proceed with Cephalosporin use.     Outpatient Medications Prior to Visit  Medication Sig Dispense Refill  . calcium carbonate (TUMS - DOSED IN MG ELEMENTAL CALCIUM) 500 MG chewable tablet Chew 2 tablets by mouth daily.    . Multiple Vitamin (ONE-A-DAY MENS PO) Take 2 tablets by mouth daily.    Marland Kitchen atorvastatin (LIPITOR) 20 MG tablet Take 1 tablet (20 mg total) by mouth daily. 30 tablet 3  . busPIRone (BUSPAR) 15 MG tablet Take 1 tablet (15 mg total) by mouth 2 (two) times daily. 60 tablet 3  . cetirizine (ZYRTEC) 10 MG tablet TAKE 1 TABLET BY MOUTH DAILY 30 tablet 2  . gabapentin (NEURONTIN) 300 MG capsule Take 1 capsule (300 mg total) by mouth at bedtime. 30 capsule 1  . hydrochlorothiazide (HYDRODIURIL) 25 MG tablet Take 1 tablet (25 mg total) by mouth daily. 30 tablet 3  . meloxicam (MOBIC) 7.5 MG tablet Take 1 tablet (7.5 mg total) by mouth daily. 30 tablet 2  . omeprazole (PRILOSEC) 20 MG capsule Take 1 capsule (20 mg total) by mouth daily. 30 capsule 3  . nicotine (NICODERM CQ - DOSED IN MG/24 HOURS)  14 mg/24hr patch Place 1 patch (14 mg total) daily onto the skin. (Patient not taking: Reported on 03/27/2017) 30 patch 2  . nicotine polacrilex (NICORETTE) 4 MG gum Take 1 each (4 mg total) by mouth as needed for smoking cessation. (Patient not taking: Reported on 11/02/2015) 100 tablet 0  . predniSONE (DELTASONE) 20 MG tablet Take 1 tablet (20 mg total) by mouth daily with breakfast. (Patient not taking: Reported on 06/24/2017) 5 tablet 0   No facility-administered medications prior to visit.     ROS Review of Systems  Constitutional: Negative for activity change and appetite change.  HENT: Negative  for sinus pressure and sore throat.   Eyes: Negative for visual disturbance.  Respiratory: Negative for cough, chest tightness and shortness of breath.   Cardiovascular: Negative for chest pain and leg swelling.  Gastrointestinal: Negative for abdominal distention, abdominal pain, constipation and diarrhea.  Endocrine: Negative.   Genitourinary: Negative for dysuria.  Musculoskeletal: Negative for joint swelling and myalgias.  Skin: Negative for rash.  Allergic/Immunologic: Negative.   Neurological: Negative for weakness, light-headedness and numbness.  Psychiatric/Behavioral: Negative for dysphoric mood and suicidal ideas.    Objective:  BP 128/89   Pulse (!) 110   Temp 97.9 F (36.6 C) (Oral)   Ht 5\' 7"  (1.702 m)   Wt 212 lb 12.8 oz (96.5 kg)   SpO2 96%   BMI 33.33 kg/m   BP/Weight 06/24/2017 03/27/2017 63/78/5885  Systolic BP 027 741 287  Diastolic BP 89 90 90  Wt. (Lbs) 212.8 221 218.6  BMI 33.33 34.61 34.24      Physical Exam  Constitutional: He is oriented to person, place, and time. He appears well-developed and well-nourished.  Cardiovascular: Normal heart sounds and intact distal pulses. Tachycardia present.  No murmur heard. Pulmonary/Chest: Effort normal and breath sounds normal. He has no wheezes. He has no rales. He exhibits no tenderness.  Abdominal: Soft. Bowel sounds are normal. He exhibits no distension and no mass. There is no tenderness.  Musculoskeletal: Normal range of motion.  Neurological: He is alert and oriented to person, place, and time.  Skin: Skin is warm and dry.  Psychiatric: He has a normal mood and affect.     Assessment & Plan:   1. Gastroesophageal reflux disease, esophagitis presence not specified Stable - omeprazole (PRILOSEC) 20 MG capsule; Take 1 capsule (20 mg total) by mouth daily.  Dispense: 30 capsule; Refill: 3  2. Chronic pain of both knees Improved ever since his weight loss - meloxicam (MOBIC) 7.5 MG tablet; Take 1  tablet (7.5 mg total) by mouth daily.  Dispense: 30 tablet; Refill: 3  3. Essential hypertension Controlled Low sodium, DASH diet - hydrochlorothiazide (HYDRODIURIL) 25 MG tablet; Take 1 tablet (25 mg total) by mouth daily.  Dispense: 30 tablet; Refill: 3  4. Seasonal allergic rhinitis due to other allergic trigger Stable - cetirizine (ZYRTEC) 10 MG tablet; Take 1 tablet (10 mg total) by mouth daily.  Dispense: 30 tablet; Refill: 3  5. Anxiety Controlled - busPIRone (BUSPAR) 15 MG tablet; Take 1 tablet (15 mg total) by mouth 2 (two) times daily.  Dispense: 60 tablet; Refill: 3  6. Pure hypercholesterolemia Controlled Low-cholesterol diet - atorvastatin (LIPITOR) 20 MG tablet; Take 1 tablet (20 mg total) by mouth daily.  Dispense: 30 tablet; Refill: 3  7.  Paresthesia of both feet Doing well on gabapentin  Meds ordered this encounter  Medications  . omeprazole (PRILOSEC) 20 MG capsule    Sig: Take  1 capsule (20 mg total) by mouth daily.    Dispense:  30 capsule    Refill:  3  . meloxicam (MOBIC) 7.5 MG tablet    Sig: Take 1 tablet (7.5 mg total) by mouth daily.    Dispense:  30 tablet    Refill:  3  . hydrochlorothiazide (HYDRODIURIL) 25 MG tablet    Sig: Take 1 tablet (25 mg total) by mouth daily.    Dispense:  30 tablet    Refill:  3  . gabapentin (NEURONTIN) 300 MG capsule    Sig: Take 1 capsule (300 mg total) by mouth at bedtime.    Dispense:  30 capsule    Refill:  3  . cetirizine (ZYRTEC) 10 MG tablet    Sig: Take 1 tablet (10 mg total) by mouth daily.    Dispense:  30 tablet    Refill:  3    Please consider 90 day supplies to promote better adherence  . busPIRone (BUSPAR) 15 MG tablet    Sig: Take 1 tablet (15 mg total) by mouth 2 (two) times daily.    Dispense:  60 tablet    Refill:  3  . atorvastatin (LIPITOR) 20 MG tablet    Sig: Take 1 tablet (20 mg total) by mouth daily.    Dispense:  30 tablet    Refill:  3    Follow-up: Return for Follow up of  chronic medical conditions.   Charlott Rakes MD

## 2017-07-18 ENCOUNTER — Other Ambulatory Visit (HOSPITAL_COMMUNITY)
Admission: RE | Admit: 2017-07-18 | Discharge: 2017-07-18 | Disposition: A | Discharge status: Home / Self Care | Payer: BLUE CROSS/BLUE SHIELD | Source: Ambulatory Visit | Attending: Family Medicine | Admitting: Family Medicine

## 2017-07-18 ENCOUNTER — Ambulatory Visit: Payer: BLUE CROSS/BLUE SHIELD | Attending: Family Medicine | Admitting: Physician Assistant

## 2017-07-18 ENCOUNTER — Encounter: Payer: Self-pay | Admitting: Family Medicine

## 2017-07-18 VITALS — BP 136/85 | HR 90 | Temp 98.5°F | Resp 18 | Ht 67.0 in | Wt 214.0 lb

## 2017-07-18 DIAGNOSIS — I10 Essential (primary) hypertension: Secondary | ICD-10-CM | POA: Insufficient documentation

## 2017-07-18 DIAGNOSIS — Z720 Tobacco use: Secondary | ICD-10-CM | POA: Diagnosis not present

## 2017-07-18 DIAGNOSIS — Z880 Allergy status to penicillin: Secondary | ICD-10-CM | POA: Insufficient documentation

## 2017-07-18 DIAGNOSIS — K219 Gastro-esophageal reflux disease without esophagitis: Secondary | ICD-10-CM | POA: Insufficient documentation

## 2017-07-18 DIAGNOSIS — F4322 Adjustment disorder with anxiety: Secondary | ICD-10-CM | POA: Insufficient documentation

## 2017-07-18 DIAGNOSIS — Z79899 Other long term (current) drug therapy: Secondary | ICD-10-CM | POA: Diagnosis not present

## 2017-07-18 DIAGNOSIS — Z202 Contact with and (suspected) exposure to infections with a predominantly sexual mode of transmission: Secondary | ICD-10-CM | POA: Insufficient documentation

## 2017-07-18 DIAGNOSIS — F329 Major depressive disorder, single episode, unspecified: Secondary | ICD-10-CM | POA: Diagnosis not present

## 2017-07-18 LAB — POCT URINALYSIS DIPSTICK
Bilirubin, UA: NEGATIVE
Blood, UA: NEGATIVE
Glucose, UA: NEGATIVE
Ketones, UA: NEGATIVE
Leukocytes, UA: NEGATIVE
Nitrite, UA: NEGATIVE
Protein, UA: POSITIVE — AB
Spec Grav, UA: 1.025 (ref 1.010–1.025)
Urobilinogen, UA: 0.2 E.U./dL
pH, UA: 6 (ref 5.0–8.0)

## 2017-07-18 MED ORDER — METRONIDAZOLE 500 MG PO TABS: 500.0000 mg | ORAL_TABLET | Freq: Two times a day (BID) | ORAL | 0 refills | Status: DC

## 2017-07-18 NOTE — Progress Notes (Signed)
Timothy Mccarthy, is a 47 y.o. male  KDT:[id number]  DXI:[id number]  DOB - 11-30-1970  Subjective:  Chief Complaint and HPI: Timothy Mccarthy is a 47 y.o. male here today after he found out that his gf tested + for trichomonas.  He denies any urinary s/sx.  No penile discharge  ROS:   Constitutional:  No f/c, No night sweats, No unexplained weight loss. EENT:  No vision changes, No blurry vision, No hearing changes. No mouth, throat, or ear problems.  Respiratory: No cough, No SOB Cardiac: No CP, no palpitations GI:  No abd pain, No N/V/D. GU: No Urinary s/sx Musculoskeletal: No joint pain Neuro: No headache, no dizziness, no motor weakness.  Skin: No rash Endocrine:  No polydipsia. No polyuria.  Psych: Denies SI/HI  No problems updated.  ALLERGIES: Allergies  Allergen Reactions  . Penicillins Anaphylaxis    Has patient had a PCN reaction causing immediate rash, facial/tongue/throat swelling, SOB or lightheadedness with hypotensionNO Has patient had a PCN reaction causing severe rash involving mucus membranes or skin necrosis: NO Has patient had a PCN reaction that required hospitalization NO Has patient had a PCN reaction occurring within the last 10 years: NO If all of the above answers are "NO", then may proceed with Cephalosporin use.    PAST MEDICAL HISTORY: Past Medical History:  Diagnosis Date  . Adjustment disorder with anxious mood   . Anxiety   . Arthritis    "hands" (09/28/2015)  . Depression   . GERD (gastroesophageal reflux disease)   . Hypertension   . Tobacco use disorder     MEDICATIONS AT HOME: Prior to Admission medications   Medication Sig Start Date End Date Taking? Authorizing Provider  atorvastatin (LIPITOR) 20 MG tablet Take 1 tablet (20 mg total) by mouth daily. 06/24/17  Yes Charlott Rakes, MD  busPIRone (BUSPAR) 15 MG tablet Take 1 tablet (15 mg total) by mouth 2 (two) times daily. 06/24/17  Yes Charlott Rakes, MD  calcium carbonate (TUMS -  DOSED IN MG ELEMENTAL CALCIUM) 500 MG chewable tablet Chew 2 tablets by mouth daily.   Yes [provider]  cetirizine (ZYRTEC) 10 MG tablet Take 1 tablet (10 mg total) by mouth daily. 06/24/17  Yes Charlott Rakes, MD  gabapentin (NEURONTIN) 300 MG capsule Take 1 capsule (300 mg total) by mouth at bedtime. 06/24/17  Yes Charlott Rakes, MD  hydrochlorothiazide (HYDRODIURIL) 25 MG tablet Take 1 tablet (25 mg total) by mouth daily. 06/24/17  Yes Charlott Rakes, MD  meloxicam (MOBIC) 7.5 MG tablet Take 1 tablet (7.5 mg total) by mouth daily. 06/24/17  Yes Charlott Rakes, MD  Multiple Vitamin (ONE-A-DAY MENS PO) Take 2 tablets by mouth daily.   Yes [provider]  omeprazole (PRILOSEC) 20 MG capsule Take 1 capsule (20 mg total) by mouth daily. 06/24/17  Yes Charlott Rakes, MD  metroNIDAZOLE (FLAGYL) 500 MG tablet Take 1 tablet (500 mg total) by mouth 2 (two) times daily. 07/18/17   Argentina Donovan, PA-C  nicotine (NICODERM CQ - DOSED IN MG/24 HOURS) 14 mg/24hr patch Place 1 patch (14 mg total) daily onto the skin. Patient not taking: Reported on 03/27/2017 12/25/16   Charlott Rakes, MD  nicotine polacrilex (NICORETTE) 4 MG gum Take 1 each (4 mg total) by mouth as needed for smoking cessation. Patient not taking: Reported on 11/02/2015 04/21/15   Loleta Chance, MD     Objective:  EXAM:   Vitals:   07/18/17 1045  BP: 136/85  Pulse: 90  Resp: 18  Temp: 98.5 F (36.9 C)  TempSrc: Oral  SpO2: 95%  Weight: 214 lb (97.1 kg)  Height: 5\' 7"  (1.702 m)    General appearance : A&OX3. NAD. Non-toxic-appearing HEENT: Atraumatic and Normocephalic.  PERRLA. EOM intact.  Neck: supple, no JVD. No cervical lymphadenopathy. No thyromegaly Chest/Lungs:  Breathing-non-labored, Good air entry bilaterally, breath sounds normal without rales, rhonchi, or wheezing  CVS: S1 S2 regular, no murmurs, gallops, rubs  Extremities: Bilateral Lower Ext shows no edema, both legs are warm to touch with =  pulse throughout Neurology:  CN II-XII grossly intact, Non focal.   Psych:  TP linear. J/I WNL. Normal speech. Appropriate eye contact and affect.  Skin:  No Rash  Data Review Lab Results  Component Value Date   HGBA1C 6.0 (H) 03/27/2017   HGBA1C 5.8 09/12/2016     Assessment & Plan   1. STD exposure (specifically trich) safe sex practices reviewed.   - Urinalysis Dipstick - Urine cytology ancillary only - HIV antibody (with reflex) - RPR - metroNIDAZOLE (FLAGYL) 500 MG tablet; Take 1 tablet (500 mg total) by mouth 2 (two) times daily.  Dispense: 14 tablet; Refill: 0     Patient have been counseled extensively about nutrition and exercise  Return if symptoms worsen or fail to improve.  The patient was given clear instructions to go to ER or return to medical center if symptoms don't improve, worsen or new problems develop. The patient verbalized understanding. The patient was told to call to get lab results if they haven't heard anything in the next week.     Freeman Caldron, PA-C [facility] and [place], [place]   07/18/2017, 10:52 AMPatient ID: Timothy Mccarthy, male   DOB: 1971-01-29, 47 y.o.   MRN: [id number]

## 2017-07-19 LAB — URINE CYTOLOGY ANCILLARY ONLY
Chlamydia: NEGATIVE
Neisseria Gonorrhea: NEGATIVE
Trichomonas: POSITIVE — AB

## 2017-07-19 LAB — HIV ANTIBODY (ROUTINE TESTING W REFLEX): HIV Screen 4th Generation wRfx: NONREACTIVE

## 2017-07-19 LAB — RPR: RPR Ser Ql: NONREACTIVE

## 2017-07-22 ENCOUNTER — Telehealth (INDEPENDENT_AMBULATORY_CARE_PROVIDER_SITE_OTHER): Payer: Self-pay | Admitting: *Deleted

## 2017-07-22 NOTE — Telephone Encounter (Signed)
Patient verified DOB Patient is aware of trich being positive and needing to complete the antibiotics as prescribed. Patient is aware of all other testing being normal/negative. No further questions.

## 2017-07-22 NOTE — Telephone Encounter (Signed)
-----   Message from Argentina Donovan, [place] sent at 07/21/2017  8:43 PM EDT ----- Please call patient.  He did test positive for trichomonas and should complete the antibiotics I prescribed him. (Please send a card to the health dept).  He was negative for HIV, syphilis, chlamydia, and gonorrhea. Thanks, Mikey Kirschner

## 2017-09-11 ENCOUNTER — Ambulatory Visit: Payer: BLUE CROSS/BLUE SHIELD | Attending: Family Medicine | Admitting: Physician Assistant

## 2017-09-11 VITALS — BP 133/87 | HR 102 | Temp 98.2°F | Resp 18 | Ht 67.0 in | Wt 214.0 lb

## 2017-09-11 DIAGNOSIS — H65192 Other acute nonsuppurative otitis media, left ear: Secondary | ICD-10-CM | POA: Diagnosis not present

## 2017-09-11 DIAGNOSIS — Z791 Long term (current) use of non-steroidal anti-inflammatories (NSAID): Secondary | ICD-10-CM | POA: Diagnosis not present

## 2017-09-11 DIAGNOSIS — K219 Gastro-esophageal reflux disease without esophagitis: Secondary | ICD-10-CM | POA: Insufficient documentation

## 2017-09-11 DIAGNOSIS — I10 Essential (primary) hypertension: Secondary | ICD-10-CM | POA: Diagnosis not present

## 2017-09-11 DIAGNOSIS — Z792 Long term (current) use of antibiotics: Secondary | ICD-10-CM | POA: Diagnosis not present

## 2017-09-11 DIAGNOSIS — Z79899 Other long term (current) drug therapy: Secondary | ICD-10-CM | POA: Insufficient documentation

## 2017-09-11 DIAGNOSIS — Z720 Tobacco use: Secondary | ICD-10-CM | POA: Insufficient documentation

## 2017-09-11 DIAGNOSIS — F419 Anxiety disorder, unspecified: Secondary | ICD-10-CM | POA: Insufficient documentation

## 2017-09-11 DIAGNOSIS — H9202 Otalgia, left ear: Secondary | ICD-10-CM | POA: Diagnosis present

## 2017-09-11 DIAGNOSIS — Z880 Allergy status to penicillin: Secondary | ICD-10-CM | POA: Diagnosis not present

## 2017-09-11 DIAGNOSIS — F329 Major depressive disorder, single episode, unspecified: Secondary | ICD-10-CM | POA: Diagnosis not present

## 2017-09-11 MED ORDER — FLUTICASONE PROPIONATE 50 MCG/ACT NA SUSP: 2.0000 | Freq: Every day | NASAL | 6 refills | Status: DC

## 2017-09-11 MED ORDER — IBUPROFEN 600 MG PO TABS: 600.0000 mg | ORAL_TABLET | Freq: Three times a day (TID) | ORAL | 0 refills | Status: DC | PRN

## 2017-09-11 MED ORDER — AZITHROMYCIN 250 MG PO TABS: ORAL_TABLET | ORAL | 0 refills | Status: DC

## 2017-09-11 NOTE — Progress Notes (Signed)
Patient ID: Timothy Mccarthy, male   DOB: December 26, 1970, 47 y.o.   MRN: [id number]   Timothy Mccarthy, is a 47 y.o. male  VZC:[id number]  JOI:[id number]  DOB - 1970/03/11  Subjective:  Chief Complaint and HPI: Timothy Mccarthy is a 47 y.o. male here today with L ear pain that has worsened over the last 2 weeks.  No f/c.  No recent swimming.  Area just in front of L ear and L neck is also painful.  No ST, URI s/sx.     ROS:   Constitutional:  No f/c, No night sweats, No unexplained weight loss. EENT:  No vision changes, No blurry vision, No hearing changes. No other mouth, throat, or ear problems.  Respiratory: No cough, No SOB Cardiac: No CP, no palpitations GI:  No abd pain, No N/V/D. GU: No Urinary s/sx Musculoskeletal: No joint pain Neuro: No headache, no dizziness, no motor weakness.  Skin: No rash Endocrine:  No polydipsia. No polyuria.  Psych: Denies SI/HI  No problems updated.  ALLERGIES: Allergies  Allergen Reactions  . Penicillins Anaphylaxis    Has patient had a PCN reaction causing immediate rash, facial/tongue/throat swelling, SOB or lightheadedness with hypotensionNO Has patient had a PCN reaction causing severe rash involving mucus membranes or skin necrosis: NO Has patient had a PCN reaction that required hospitalization NO Has patient had a PCN reaction occurring within the last 10 years: NO If all of the above answers are "NO", then may proceed with Cephalosporin use.    PAST MEDICAL HISTORY: Past Medical History:  Diagnosis Date  . Adjustment disorder with anxious mood   . Anxiety   . Arthritis    "hands" (09/28/2015)  . Depression   . GERD (gastroesophageal reflux disease)   . Hypertension   . Tobacco use disorder     MEDICATIONS AT HOME: Prior to Admission medications   Medication Sig Start Date End Date Taking? Authorizing Provider  atorvastatin (LIPITOR) 20 MG tablet Take 1 tablet (20 mg total) by mouth daily. 06/24/17  Yes Charlott Rakes, MD  busPIRone  (BUSPAR) 15 MG tablet Take 1 tablet (15 mg total) by mouth 2 (two) times daily. 06/24/17  Yes Charlott Rakes, MD  calcium carbonate (TUMS - DOSED IN MG ELEMENTAL CALCIUM) 500 MG chewable tablet Chew 2 tablets by mouth daily.   Yes [provider]  cetirizine (ZYRTEC) 10 MG tablet Take 1 tablet (10 mg total) by mouth daily. 06/24/17  Yes Charlott Rakes, MD  gabapentin (NEURONTIN) 300 MG capsule Take 1 capsule (300 mg total) by mouth at bedtime. 06/24/17  Yes Charlott Rakes, MD  hydrochlorothiazide (HYDRODIURIL) 25 MG tablet Take 1 tablet (25 mg total) by mouth daily. 06/24/17  Yes Charlott Rakes, MD  meloxicam (MOBIC) 7.5 MG tablet Take 1 tablet (7.5 mg total) by mouth daily. 06/24/17  Yes Charlott Rakes, MD  Multiple Vitamin (ONE-A-DAY MENS PO) Take 2 tablets by mouth daily.   Yes [provider]  omeprazole (PRILOSEC) 20 MG capsule Take 1 capsule (20 mg total) by mouth daily. 06/24/17  Yes Charlott Rakes, MD  azithromycin (ZITHROMAX) 250 MG tablet Take 2 today then 1 daily 09/11/17   Freeman Caldron M, PA-C  fluticasone [facility]) 50 MCG/ACT nasal spray Place 2 sprays into both nostrils daily. 09/11/17   Argentina Donovan, PA-C  ibuprofen (ADVIL,MOTRIN) 600 MG tablet Take 1 tablet (600 mg total) by mouth every 8 (eight) hours as needed for moderate pain. 09/11/17   Argentina Donovan, PA-C  nicotine (NICODERM  CQ - DOSED IN MG/24 HOURS) 14 mg/24hr patch Place 1 patch (14 mg total) daily onto the skin. Patient not taking: Reported on 03/27/2017 12/25/16   Charlott Rakes, MD  nicotine polacrilex (NICORETTE) 4 MG gum Take 1 each (4 mg total) by mouth as needed for smoking cessation. Patient not taking: Reported on 11/02/2015 04/21/15   Loleta Chance, MD     Objective:  EXAM:   Vitals:   09/11/17 0937  BP: 133/87  Pulse: (!) 102  Resp: 18  Temp: 98.2 F (36.8 C)  TempSrc: Oral  SpO2: 96%  Weight: 214 lb (97.1 kg)  Height: 5\' 7"  (1.702 m)    General appearance : A&OX3. NAD.  Non-toxic-appearing HEENT: Atraumatic and Normocephalic.  PERRLA. EOM intact.  TM clear on R. TM on L distorted and erythematous without perforation.  Canal WNL.   Mouth-MMM, post pharynx WNL w/o erythema, No PND. Neck: supple, no JVD. No cervical lymphadenopathy. No thyromegaly Chest/Lungs:  Breathing-non-labored, Good air entry bilaterally, breath sounds normal without rales, rhonchi, or wheezing  CVS: S1 S2 regular, no murmurs, gallops, rubs  Extremities: Bilateral Lower Ext shows no edema, both legs are warm to touch with = pulse throughout Neurology:  CN II-XII grossly intact, Non focal.   Psych:  TP linear. J/I WNL. Normal speech. Appropriate eye contact and affect.  Skin:  No Rash  Data Review Lab Results  Component Value Date   HGBA1C 6.0 (H) 03/27/2017   HGBA1C 5.8 09/12/2016     Assessment & Plan   1. Other non-recurrent acute nonsuppurative otitis media of left ear Doubt but could also be concomitant shingles prodrome(doubt but  I have instructed him to RTC immediately, go to ED or UC if he worsens or gets a rash)  - azithromycin (ZITHROMAX) 250 MG tablet; Take 2 today then 1 daily  Dispense: 6 tablet; Refill: 0 - fluticasone (FLONASE) 50 MCG/ACT nasal spray; Place 2 sprays into both nostrils daily.  Dispense: 16 g; Refill: 6 - ibuprofen (ADVIL,MOTRIN) 600 MG tablet; Take 1 tablet (600 mg total) by mouth every 8 (eight) hours as needed for moderate pain.  Dispense: 30 tablet; Refill: 0  2. Essential hypertension Controlled-continue current regimen.       Patient have been counseled extensively about nutrition and exercise  Return if symptoms worsen or fail to improve.  The patient was given clear instructions to go to ER or return to medical center if symptoms don't improve, worsen or new problems develop. The patient verbalized understanding. The patient was told to call to get lab results if they haven't heard anything in the next week.     Freeman Caldron,  PA-C [facility] and [facility] [place], [place]   09/11/2017, 9:44 AM

## 2017-09-11 NOTE — Patient Instructions (Addendum)
mucinex D over the counter  Otitis Media, Adult Otitis media is redness, soreness, and puffiness (swelling) in the space just behind your eardrum (middle ear). It may be caused by allergies or infection. It often happens along with a cold. Follow these instructions at home:  Take your medicine as told. Finish it even if you start to feel better.  Only take over-the-counter or prescription medicines for pain, discomfort, or fever as told by your doctor.  Follow up with your doctor as told. Contact a doctor if:  You have otitis media only in one ear, or bleeding from your nose, or both.  You notice a lump on your neck.  You are not getting better in 3-5 days.  You feel worse instead of better. Get help right away if:  You have pain that is not helped with medicine.  You have puffiness, redness, or pain around your ear.  You get a stiff neck.  You cannot move part of your face (paralysis).  You notice that the bone behind your ear hurts when you touch it. This information is not intended to replace advice given to you by your health care provider. Make sure you discuss any questions you have with your health care provider. Document Released: 07/18/2007 Document Revised: 07/07/2015 Document Reviewed: 08/26/2012 Elsevier Interactive Patient Education  2017 [employer].

## 2017-10-25 ENCOUNTER — Encounter: Payer: Self-pay | Admitting: Nurse Practitioner

## 2017-10-25 ENCOUNTER — Ambulatory Visit: Payer: BLUE CROSS/BLUE SHIELD | Attending: Nurse Practitioner | Admitting: Nurse Practitioner

## 2017-10-25 VITALS — BP 130/91 | HR 92 | Temp 98.9°F | Ht 67.0 in | Wt 215.8 lb

## 2017-10-25 DIAGNOSIS — M1990 Unspecified osteoarthritis, unspecified site: Secondary | ICD-10-CM | POA: Diagnosis not present

## 2017-10-25 DIAGNOSIS — Z720 Tobacco use: Secondary | ICD-10-CM | POA: Insufficient documentation

## 2017-10-25 DIAGNOSIS — K219 Gastro-esophageal reflux disease without esophagitis: Secondary | ICD-10-CM | POA: Diagnosis not present

## 2017-10-25 DIAGNOSIS — M25561 Pain in right knee: Secondary | ICD-10-CM | POA: Diagnosis not present

## 2017-10-25 DIAGNOSIS — G8929 Other chronic pain: Secondary | ICD-10-CM

## 2017-10-25 DIAGNOSIS — I10 Essential (primary) hypertension: Secondary | ICD-10-CM | POA: Diagnosis not present

## 2017-10-25 DIAGNOSIS — Z79899 Other long term (current) drug therapy: Secondary | ICD-10-CM | POA: Insufficient documentation

## 2017-10-25 DIAGNOSIS — R1032 Left lower quadrant pain: Secondary | ICD-10-CM | POA: Diagnosis not present

## 2017-10-25 DIAGNOSIS — Z791 Long term (current) use of non-steroidal anti-inflammatories (NSAID): Secondary | ICD-10-CM | POA: Insufficient documentation

## 2017-10-25 DIAGNOSIS — M25562 Pain in left knee: Secondary | ICD-10-CM

## 2017-10-25 MED ORDER — MELOXICAM 15 MG PO TABS: 15.0000 mg | ORAL_TABLET | Freq: Every day | ORAL | 0 refills | Status: DC

## 2017-10-25 MED ORDER — OMEPRAZOLE 40 MG PO CPDR: 40.0000 mg | DELAYED_RELEASE_CAPSULE | Freq: Every day | ORAL | 0 refills | Status: DC

## 2017-10-25 NOTE — Patient Instructions (Signed)
Food Choices for Gastroesophageal Reflux Disease, Adult When you have gastroesophageal reflux disease (GERD), the foods you eat and your eating habits are very important. Choosing the right foods can help ease the discomfort of GERD. Consider working with a diet and nutrition specialist (dietitian) to help you make healthy food choices. What general guidelines should I follow? Eating plan  Choose healthy foods low in fat, such as fruits, vegetables, whole grains, low-fat dairy products, and lean meat, fish, and poultry.  Eat frequent, small meals instead of three large meals each day. Eat your meals slowly, in a relaxed setting. Avoid bending over or lying down until 2-3 hours after eating.  Limit high-fat foods such as fatty meats or fried foods.  Limit your intake of oils, butter, and shortening to less than 8 teaspoons each day.  Avoid the following: ? Foods that cause symptoms. These may be different for different people. Keep a food diary to keep track of foods that cause symptoms. ? Alcohol. ? Drinking large amounts of liquid with meals. ? Eating meals during the 2-3 hours before bed.  Cook foods using methods other than frying. This may include baking, grilling, or broiling. Lifestyle   Maintain a healthy weight. Ask your health care provider what weight is healthy for you. If you need to lose weight, work with your health care provider to do so safely.  Exercise for at least 30 minutes on 5 or more days each week, or as told by your health care provider.  Avoid wearing clothes that fit tightly around your waist and chest.  Do not use any products that contain nicotine or tobacco, such as cigarettes and e-cigarettes. If you need help quitting, ask your health care provider.  Sleep with the head of your bed raised. Use a wedge under the mattress or blocks under the bed frame to raise the head of the bed. What foods are not recommended? The items listed may not be a complete  list. Talk with your dietitian about what dietary choices are best for you. Grains Pastries or quick breads with added fat. French toast. Vegetables Deep fried vegetables. French fries. Any vegetables prepared with added fat. Any vegetables that cause symptoms. For some people this may include tomatoes and tomato products, chili peppers, onions and garlic, and horseradish. Fruits Any fruits prepared with added fat. Any fruits that cause symptoms. For some people this may include citrus fruits, such as oranges, grapefruit, pineapple, and lemons. Meats and other protein foods High-fat meats, such as fatty beef or pork, hot dogs, ribs, ham, sausage, salami and bacon. Fried meat or protein, including fried fish and fried chicken. Nuts and nut butters. Dairy Whole milk and chocolate milk. Sour cream. Cream. Ice cream. Cream cheese. Milk shakes. Beverages Coffee and tea, with or without caffeine. Carbonated beverages. Sodas. Energy drinks. Fruit juice made with acidic fruits (such as orange or grapefruit). Tomato juice. Alcoholic drinks. Fats and oils Butter. Margarine. Shortening. Ghee. Sweets and desserts Chocolate and cocoa. Donuts. Seasoning and other foods Pepper. Peppermint and spearmint. Any condiments, herbs, or seasonings that cause symptoms. For some people, this may include curry, hot sauce, or vinegar-based salad dressings. Summary  When you have gastroesophageal reflux disease (GERD), food and lifestyle choices are very important to help ease the discomfort of GERD.  Eat frequent, small meals instead of three large meals each day. Eat your meals slowly, in a relaxed setting. Avoid bending over or lying down until 2-3 hours after eating.  Limit high-fat   foods such as fatty meat or fried foods. This information is not intended to replace advice given to you by your health care provider. Make sure you discuss any questions you have with your health care provider. Document Released:  01/29/2005 Document Revised: 01/31/2016 Document Reviewed: 01/31/2016 Elsevier Interactive Patient Education  2018 [employer].  Heartburn Heartburn is a type of pain or discomfort that can happen in the throat or chest. It is often described as a burning pain. It may also cause a bad taste in the mouth. Heartburn may feel worse when you lie down or bend over. It may be caused by stomach contents that move back up (reflux) into the tube that connects the mouth with the stomach (esophagus). Follow these instructions at home: Take these actions to lessen your discomfort and to help avoid problems. Diet  Follow a diet as told by your doctor. You may need to avoid foods and drinks such as: ? Coffee and tea (with or without caffeine). ? Drinks that contain alcohol. ? Energy drinks and sports drinks. ? Carbonated drinks or sodas. ? Chocolate and cocoa. ? Peppermint and mint flavorings. ? Garlic and onions. ? Horseradish. ? Spicy and acidic foods, such as peppers, chili powder, curry powder, vinegar, hot sauces, and BBQ sauce. ? Citrus fruit juices and citrus fruits, such as oranges, lemons, and limes. ? Tomato-based foods, such as red sauce, chili, salsa, and pizza with red sauce. ? Fried and fatty foods, such as donuts, french fries, potato chips, and high-fat dressings. ? High-fat meats, such as hot dogs, rib eye steak, sausage, ham, and bacon. ? High-fat dairy items, such as whole milk, butter, and cream cheese.  Eat small meals often. Avoid eating large meals.  Avoid drinking large amounts of liquid with your meals.  Avoid eating meals during the 2-3 hours before bedtime.  Avoid lying down right after you eat.  Do not exercise right after you eat. General instructions  Pay attention to any changes in your symptoms.  Take over-the-counter and prescription medicines only as told by your doctor. Do not take aspirin, ibuprofen, or other NSAIDs unless your doctor says it is  okay.  Do not use any tobacco products, including cigarettes, chewing tobacco, and e-cigarettes. If you need help quitting, ask your doctor.  Wear loose clothes. Do not wear anything tight around your waist.  Raise (elevate) the head of your bed about 6 inches (15 cm).  Try to lower your stress. If you need help doing this, ask your doctor.  If you are overweight, lose an amount of weight that is healthy for you. Ask your doctor about a safe weight loss goal.  Keep all follow-up visits as told by your doctor. This is important. Contact a doctor if:  You have new symptoms.  You lose weight and you do not know why it is happening.  You have trouble swallowing, or it hurts to swallow.  You have wheezing or a cough that keeps happening.  Your symptoms do not get better with treatment.  You have heartburn often for more than two weeks. Get help right away if:  You have pain in your arms, neck, jaw, teeth, or back.  You feel sweaty, dizzy, or light-headed.  You have chest pain or shortness of breath.  You throw up (vomit) and your throw up looks like blood or coffee grounds.  Your poop (stool) is bloody or black. This information is not intended to replace advice given to you by your health  care provider. Make sure you discuss any questions you have with your health care provider. Document Released: 10/11/2010 Document Revised: 07/07/2015 Document Reviewed: 05/26/2014 Elsevier Interactive Patient Education  [employer].

## 2017-10-25 NOTE — Progress Notes (Signed)
Assessment & Plan:  Timothy Mccarthy was seen today for abdominal pain.  Diagnoses and all orders for this visit:  Gastroesophageal reflux disease, esophagitis presence not specified -     omeprazole (PRILOSEC) 40 MG capsule; Take 1 capsule (40 mg total) by mouth daily. INSTRUCTIONS: Avoid GERD Triggers: acidic, spicy or fried foods, caffeine, coffee, sodas,  alcohol and chocolate.   Chronic pain of both knees -     meloxicam (MOBIC) 15 MG tablet; Take 1 tablet (15 mg total) by mouth daily. Work on losing weight to help reduce joint pain. May alternate with heat and ice application for pain relief. May also alternate with acetaminophen  prescribed for pain relief. Other alternatives include massage, acupuncture and water aerobics.  You must stay active and avoid a sedentary lifestyle. Please have your xrays performed that were ordered by your PCP  Left lower quadrant pain -     CBC  Patient has been counseled on age-appropriate routine health concerns for screening and prevention. These are reviewed and up-to-date. Referrals have been placed accordingly. Immunizations are up-to-date or declined.    Subjective:   Chief Complaint  Patient presents with  . Abdominal Pain    Pt. stated his stomach been feeling gassy and disomfort with left side pain. Pt. is taking his Omeprazole.    HPI Timothy Mccarthy 47 y.o. male presents to office today with complaints of abdominal pain. He has a history of GERD and endorses medication compliance taking omeprazole.   Abdominal Pain Patient complains of abdominal pain. The pain is described as gassy and bloated, and is 8/10 in intensity. Pain is described as gassy and bloated in nature. Pain is located  diffusely without radiation. Onset was 2 weeks ago. Symptoms have been gradually worsening since. Aggravating factors: dietary changes.  Alleviating factors: none. Associated symptoms: belching and flatus. The patient denies chills, fever, hematochezia, melena,  nausea and vomiting. Pain is worse at night. He does endorse poor eating habits lately over the last few weeks. He is eating more greasy foods and sometimes eating late at night.   Bilateral Knee pain Chronic. Currently taking mobic 7.5 mg daily with little relief of symptoms. Job requires him to walk multiple flights of stairs and he is now wearing a left knee brace. Also endorsing swelling of left knee. Will increase meloxicam and have him follow up in several weeks for further evaluation. Aggravating factors: walking stairs, bending, climbing.    Review of Systems  Constitutional: Negative for fever, malaise/fatigue and weight loss.  HENT: Negative.  Negative for nosebleeds.   Eyes: Negative.  Negative for blurred vision, double vision and photophobia.  Respiratory: Negative.  Negative for cough and shortness of breath.   Cardiovascular: Negative.  Negative for chest pain, palpitations and leg swelling.  Gastrointestinal: Positive for abdominal pain, constipation and heartburn. Negative for nausea and vomiting.       SEE HPI  Musculoskeletal: Positive for joint pain. Negative for myalgias.       SEE HPI  Neurological: Negative.  Negative for dizziness, focal weakness, seizures and headaches.  Psychiatric/Behavioral: Negative.  Negative for suicidal ideas.    Past Medical History:  Diagnosis Date  . Adjustment disorder with anxious mood   . Anxiety   . Arthritis    "hands" (09/28/2015)  . Depression   . GERD (gastroesophageal reflux disease)   . Hypertension   . Tobacco use disorder     Past Surgical History:  Procedure Laterality Date  . APPENDECTOMY  09/15/2015  . CARPAL TUNNEL RELEASE Bilateral   . INCISIONAL HERNIA REPAIR N/A 09/29/2015   Procedure: HERNIA REPAIR INCISIONAL;  Surgeon: Erroll Luna, MD;  Location: [place];  Service: General;  Laterality: N/A;  . LAPAROSCOPIC APPENDECTOMY N/A 09/15/2015   Procedure: APPENDECTOMY LAPAROSCOPIC;  Surgeon: Judeth Horn, MD;   Location: [place];  Service: General;  Laterality: N/A;  . LUMPS ON BACK  2013  . WISDOM TOOTH EXTRACTION      Family History  Problem Relation Age of Onset  . Stroke Mother   . Diabetes type II Mother     Social History Reviewed with no changes to be made today.   Outpatient Medications Prior to Visit  Medication Sig Dispense Refill  . atorvastatin (LIPITOR) 20 MG tablet Take 1 tablet (20 mg total) by mouth daily. 30 tablet 3  . busPIRone (BUSPAR) 15 MG tablet Take 1 tablet (15 mg total) by mouth 2 (two) times daily. 60 tablet 3  . cetirizine (ZYRTEC) 10 MG tablet Take 1 tablet (10 mg total) by mouth daily. 30 tablet 3  . gabapentin (NEURONTIN) 300 MG capsule Take 1 capsule (300 mg total) by mouth at bedtime. 30 capsule 3  . hydrochlorothiazide (HYDRODIURIL) 25 MG tablet Take 1 tablet (25 mg total) by mouth daily. 30 tablet 3  . ibuprofen (ADVIL,MOTRIN) 600 MG tablet Take 1 tablet (600 mg total) by mouth every 8 (eight) hours as needed for moderate pain. 30 tablet 0  . Multiple Vitamin (ONE-A-DAY MENS PO) Take 2 tablets by mouth daily.    Marland Kitchen omeprazole (PRILOSEC) 20 MG capsule Take 1 capsule (20 mg total) by mouth daily. 30 capsule 3  . calcium carbonate (TUMS - DOSED IN MG ELEMENTAL CALCIUM) 500 MG chewable tablet Chew 2 tablets by mouth daily.    . fluticasone (FLONASE) 50 MCG/ACT nasal spray Place 2 sprays into both nostrils daily. (Patient not taking: Reported on 10/25/2017) 16 g 6  . nicotine (NICODERM CQ - DOSED IN MG/24 HOURS) 14 mg/24hr patch Place 1 patch (14 mg total) daily onto the skin. (Patient not taking: Reported on 03/27/2017) 30 patch 2  . nicotine polacrilex (NICORETTE) 4 MG gum Take 1 each (4 mg total) by mouth as needed for smoking cessation. (Patient not taking: Reported on 11/02/2015) 100 tablet 0  . azithromycin (ZITHROMAX) 250 MG tablet Take 2 today then 1 daily 6 tablet 0  . meloxicam (MOBIC) 7.5 MG tablet Take 1 tablet (7.5 mg total) by mouth daily. (Patient not  taking: Reported on 10/25/2017) 30 tablet 3   No facility-administered medications prior to visit.     Allergies  Allergen Reactions  . Penicillins Anaphylaxis    Has patient had a PCN reaction causing immediate rash, facial/tongue/throat swelling, SOB or lightheadedness with hypotensionNO Has patient had a PCN reaction causing severe rash involving mucus membranes or skin necrosis: NO Has patient had a PCN reaction that required hospitalization NO Has patient had a PCN reaction occurring within the last 10 years: NO If all of the above answers are "NO", then may proceed with Cephalosporin use.       Objective:    BP (!) 130/91 (BP Location: Left Arm, Patient Position: Sitting, Cuff Size: Large)   Pulse 92   Temp 98.9 F (37.2 C) (Oral)   Ht 5\' 7"  (1.702 m)   Wt 215 lb 12.8 oz (97.9 kg)   SpO2 94%   BMI 33.80 kg/m  Wt Readings from Last 3 Encounters:  10/25/17 215 lb 12.8  oz (97.9 kg)  09/11/17 214 lb (97.1 kg)  07/18/17 214 lb (97.1 kg)    Physical Exam  Constitutional: He is oriented to person, place, and time. He appears well-developed and well-nourished. He is cooperative.  HENT:  Head: Normocephalic and atraumatic.  Eyes: EOM are normal.  Neck: Normal range of motion.  Cardiovascular: Normal rate, regular rhythm, normal heart sounds and intact distal pulses. Exam reveals no gallop and no friction rub.  No murmur heard. Pulmonary/Chest: Effort normal and breath sounds normal. No tachypnea. No respiratory distress. He has no decreased breath sounds. He has no wheezes. He has no rhonchi. He has no rales. He exhibits no tenderness.  Abdominal: Soft. Bowel sounds are normal. There is tenderness in the epigastric area, periumbilical area, left upper quadrant and left lower quadrant. There is no CVA tenderness.  Musculoskeletal: He exhibits no edema.       Right knee: He exhibits decreased range of motion. He exhibits no swelling.       Left knee: He exhibits decreased range  of motion and swelling (and crepitus with passive ROM).  Neurological: He is alert and oriented to person, place, and time. Coordination normal.  Skin: Skin is warm and dry.  Psychiatric: He has a normal mood and affect. His behavior is normal. Judgment and thought content normal.  Nursing note and vitals reviewed.      Patient has been counseled extensively about nutrition and exercise as well as the importance of adherence with medications and regular follow-up. The patient was given clear instructions to go to ER or return to medical center if symptoms don't improve, worsen or new problems develop. The patient verbalized understanding.   Follow-up: Return for f/u DR Newlin knee pain/abd pain.   Gildardo Pounds, FNP-BC [facility] [address], [place]   10/25/2017, 12:20 PM

## 2017-10-26 LAB — CBC
Hematocrit: 46 % (ref 37.5–51.0)
Hemoglobin: 14.8 g/dL (ref 13.0–17.7)
MCH: 26.9 pg (ref 26.6–33.0)
MCHC: 32.2 g/dL (ref 31.5–35.7)
MCV: 84 fL (ref 79–97)
Platelets: 259 10*3/uL (ref 150–450)
RBC: 5.5 x10E6/uL (ref 4.14–5.80)
RDW: 13.3 % (ref 12.3–15.4)
WBC: 10.1 10*3/uL (ref 3.4–10.8)

## 2017-10-29 ENCOUNTER — Telehealth: Payer: Self-pay

## 2017-10-29 NOTE — Telephone Encounter (Signed)
CMA spoke to patient to inform on results.  Patient verified DOB. Patient understood.  

## 2017-10-29 NOTE — Telephone Encounter (Signed)
-----   Message from Gildardo Pounds, NP sent at 10/28/2017  6:47 PM EDT ----- CBC does not show an elevated White blood cell count and is normal.

## 2017-11-26 ENCOUNTER — Ambulatory Visit (HOSPITAL_COMMUNITY)
Admission: RE | Admit: 2017-11-26 | Discharge: 2017-11-26 | Disposition: A | Discharge status: Home / Self Care | Payer: BLUE CROSS/BLUE SHIELD | Source: Ambulatory Visit | Attending: Family Medicine | Admitting: Family Medicine

## 2017-11-26 ENCOUNTER — Ambulatory Visit (HOSPITAL_BASED_OUTPATIENT_CLINIC_OR_DEPARTMENT_OTHER): Payer: BLUE CROSS/BLUE SHIELD | Admitting: Licensed Clinical Social Worker

## 2017-11-26 ENCOUNTER — Encounter: Payer: Self-pay | Admitting: Family Medicine

## 2017-11-26 ENCOUNTER — Ambulatory Visit (HOSPITAL_BASED_OUTPATIENT_CLINIC_OR_DEPARTMENT_OTHER): Payer: BLUE CROSS/BLUE SHIELD | Admitting: Family Medicine

## 2017-11-26 DIAGNOSIS — F331 Major depressive disorder, recurrent, moderate: Secondary | ICD-10-CM

## 2017-11-26 DIAGNOSIS — M25561 Pain in right knee: Secondary | ICD-10-CM | POA: Diagnosis not present

## 2017-11-26 DIAGNOSIS — M25562 Pain in left knee: Secondary | ICD-10-CM | POA: Insufficient documentation

## 2017-11-26 DIAGNOSIS — F419 Anxiety disorder, unspecified: Secondary | ICD-10-CM

## 2017-11-26 DIAGNOSIS — E7800 Pure hypercholesterolemia, unspecified: Secondary | ICD-10-CM | POA: Insufficient documentation

## 2017-11-26 DIAGNOSIS — J3089 Other allergic rhinitis: Secondary | ICD-10-CM | POA: Diagnosis not present

## 2017-11-26 DIAGNOSIS — K219 Gastro-esophageal reflux disease without esophagitis: Secondary | ICD-10-CM | POA: Diagnosis not present

## 2017-11-26 DIAGNOSIS — J302 Other seasonal allergic rhinitis: Secondary | ICD-10-CM | POA: Diagnosis not present

## 2017-11-26 DIAGNOSIS — R109 Unspecified abdominal pain: Secondary | ICD-10-CM | POA: Insufficient documentation

## 2017-11-26 DIAGNOSIS — Z79899 Other long term (current) drug therapy: Secondary | ICD-10-CM | POA: Insufficient documentation

## 2017-11-26 DIAGNOSIS — Z9889 Other specified postprocedural states: Secondary | ICD-10-CM | POA: Insufficient documentation

## 2017-11-26 DIAGNOSIS — I10 Essential (primary) hypertension: Secondary | ICD-10-CM

## 2017-11-26 DIAGNOSIS — G8929 Other chronic pain: Secondary | ICD-10-CM | POA: Insufficient documentation

## 2017-11-26 DIAGNOSIS — Z880 Allergy status to penicillin: Secondary | ICD-10-CM | POA: Diagnosis not present

## 2017-11-26 MED ORDER — GABAPENTIN 300 MG PO CAPS: 300.0000 mg | ORAL_CAPSULE | Freq: Every day | ORAL | 6 refills | Status: DC

## 2017-11-26 MED ORDER — BUSPIRONE HCL 15 MG PO TABS: 15.0000 mg | ORAL_TABLET | Freq: Two times a day (BID) | ORAL | 6 refills | Status: DC

## 2017-11-26 MED ORDER — ATORVASTATIN CALCIUM 20 MG PO TABS: 20.0000 mg | ORAL_TABLET | Freq: Every day | ORAL | 6 refills | Status: DC

## 2017-11-26 MED ORDER — CETIRIZINE HCL 10 MG PO TABS: 10.0000 mg | ORAL_TABLET | Freq: Every day | ORAL | 6 refills | Status: DC

## 2017-11-26 MED ORDER — PREDNISONE 20 MG PO TABS: 20.0000 mg | ORAL_TABLET | Freq: Every day | ORAL | 0 refills | Status: DC

## 2017-11-26 MED ORDER — HYDROCHLOROTHIAZIDE 25 MG PO TABS: 25.0000 mg | ORAL_TABLET | Freq: Every day | ORAL | 6 refills | Status: DC

## 2017-11-26 NOTE — Patient Instructions (Signed)

## 2017-11-26 NOTE — Progress Notes (Signed)
Subjective:  Patient ID: EGE MUCKEY, male    DOB: 11-Mar-1970  Age: 47 y.o. MRN: [id number]  CC: Abdominal Pain and Knee Pain   HPI Timothy Mccarthy is a 47 year old male with a history of hypertension, GERD, anxiety who presents today for a follow-up visit. He states his anxiety is stable as he has gotten rid of a couple of stressors but he is not at his best.  Compliant with [employer] and does not undergo counseling at this time but has thought of doing so.  He is currently caring for his elderly mom who is in the rehab facility and he works multiple jobs. Denies depression, suicidal ideation or intents. At his job at [employer] he has climb multiple steps which lead to worsening of his chronic knee pain.  Pain is described as moderate today as he has not been to work yet but in rainy and cold his pain is severe and uncontrolled on meloxicam.  He is not open to receiving steroid injections. His reflux symptoms have been stable on his PPI as he has made some dietary and lifestyle modifications and avoids eating late which have brought about some improvement.  He was seen last month by the nurse practitioner for abdominal pain which she states has resolved at this time.  Past Medical History:  Diagnosis Date  . Adjustment disorder with anxious mood   . Anxiety   . Arthritis    "hands" (09/28/2015)  . Depression   . GERD (gastroesophageal reflux disease)   . Hypertension   . Tobacco use disorder     Past Surgical History:  Procedure Laterality Date  . APPENDECTOMY  09/15/2015  . CARPAL TUNNEL RELEASE Bilateral   . INCISIONAL HERNIA REPAIR N/A 09/29/2015   Procedure: HERNIA REPAIR INCISIONAL;  Surgeon: Erroll Luna, MD;  Location: [place];  Service: General;  Laterality: N/A;  . LAPAROSCOPIC APPENDECTOMY N/A 09/15/2015   Procedure: APPENDECTOMY LAPAROSCOPIC;  Surgeon: Judeth Horn, MD;  Location: [place];  Service: General;  Laterality: N/A;  . LUMPS ON BACK  2013  . WISDOM TOOTH EXTRACTION       Allergies  Allergen Reactions  . Penicillins Anaphylaxis    Has patient had a PCN reaction causing immediate rash, facial/tongue/throat swelling, SOB or lightheadedness with hypotensionNO Has patient had a PCN reaction causing severe rash involving mucus membranes or skin necrosis: NO Has patient had a PCN reaction that required hospitalization NO Has patient had a PCN reaction occurring within the last 10 years: NO If all of the above answers are "NO", then may proceed with Cephalosporin use.     Outpatient Medications Prior to Visit  Medication Sig Dispense Refill  . calcium carbonate (TUMS - DOSED IN MG ELEMENTAL CALCIUM) 500 MG chewable tablet Chew 2 tablets by mouth daily.    . fluticasone (FLONASE) 50 MCG/ACT nasal spray Place 2 sprays into both nostrils daily. 16 g 6  . Multiple Vitamin (ONE-A-DAY MENS PO) Take 2 tablets by mouth daily.    . nicotine (NICODERM CQ - DOSED IN MG/24 HOURS) 14 mg/24hr patch Place 1 patch (14 mg total) daily onto the skin. 30 patch 2  . nicotine polacrilex (NICORETTE) 4 MG gum Take 1 each (4 mg total) by mouth as needed for smoking cessation. 100 tablet 0  . omeprazole (PRILOSEC) 40 MG capsule Take 1 capsule (40 mg total) by mouth daily. 90 capsule 0  . atorvastatin (LIPITOR) 20 MG tablet Take 1 tablet (20 mg total) by mouth daily.  30 tablet 3  . busPIRone (BUSPAR) 15 MG tablet Take 1 tablet (15 mg total) by mouth 2 (two) times daily. 60 tablet 3  . cetirizine (ZYRTEC) 10 MG tablet Take 1 tablet (10 mg total) by mouth daily. 30 tablet 3  . gabapentin (NEURONTIN) 300 MG capsule Take 1 capsule (300 mg total) by mouth at bedtime. 30 capsule 3  . hydrochlorothiazide (HYDRODIURIL) 25 MG tablet Take 1 tablet (25 mg total) by mouth daily. 30 tablet 3  . ibuprofen (ADVIL,MOTRIN) 600 MG tablet Take 1 tablet (600 mg total) by mouth every 8 (eight) hours as needed for moderate pain. (Patient not taking: Reported on 11/26/2017) 30 tablet 0   No  facility-administered medications prior to visit.     ROS Review of Systems  Constitutional: Negative for activity change and appetite change.  HENT: Negative for sinus pressure and sore throat.   Eyes: Negative for visual disturbance.  Respiratory: Negative for cough, chest tightness and shortness of breath.   Cardiovascular: Negative for chest pain and leg swelling.  Gastrointestinal: Negative for abdominal distention, abdominal pain, constipation and diarrhea.  Endocrine: Negative.   Genitourinary: Negative for dysuria.  Musculoskeletal:       See hpi  Skin: Negative for rash.  Allergic/Immunologic: Negative.   Neurological: Negative for weakness, light-headedness and numbness.  Psychiatric/Behavioral: Negative for dysphoric mood and suicidal ideas.    Objective:  BP 129/89   Pulse 82   Temp 98.4 F (36.9 C) (Oral)   Ht '5\' 7"'  (1.702 m)   Wt 217 lb (98.4 kg)   SpO2 98%   BMI 33.99 kg/m   BP/Weight 11/26/2017 10/25/2017 11/03/1939  Systolic BP 740 814 481  Diastolic BP 89 91 87  Wt. (Lbs) 217 215.8 214  BMI 33.99 33.8 33.52      Physical Exam  Constitutional: He is oriented to person, place, and time. He appears well-developed and well-nourished.  Cardiovascular: Normal rate, normal heart sounds and intact distal pulses.  No murmur heard. Pulmonary/Chest: Effort normal and breath sounds normal. He has no wheezes. He has no rales. He exhibits no tenderness.  Abdominal: Soft. Bowel sounds are normal. He exhibits no distension and no mass. There is no tenderness.  Musculoskeletal:  Normal appearance of both knees Slight crepitus on range of motion of both knees associated with minimal tenderness  Neurological: He is alert and oriented to person, place, and time.  Skin: Skin is warm and dry.    CMP Latest Ref Rng & Units 03/27/2017 06/11/2016 03/14/2016  Glucose 65 - 99 mg/dL 92 115(H) 89  BUN 6 - 24 mg/dL '10 9 11  ' Creatinine 0.76 - 1.27 mg/dL 1.22 1.04 1.36(H)   Sodium 134 - 144 mmol/L 143 141 137  Potassium 3.5 - 5.2 mmol/L 3.9 4.3 3.9  Chloride 96 - 106 mmol/L 104 98 100  CO2 20 - 29 mmol/L '24 28 26  ' Calcium 8.7 - 10.2 mg/dL 9.7 9.8 9.3  Total Protein 6.0 - 8.5 g/dL 7.1 6.6 -  Total Bilirubin 0.0 - 1.2 mg/dL <0.2 <0.2 -  Alkaline Phos 39 - 117 IU/L 103 87 -  AST 0 - 40 IU/L 28 13 -  ALT 0 - 44 IU/L 39 18 -    Lipid Panel     Component Value Date/Time   CHOL 213 (H) 06/11/2016 0911   TRIG 174 (H) 06/11/2016 0911   HDL 56 06/11/2016 0911   CHOLHDL 3.8 06/11/2016 0911   LDLCALC 122 (H) 06/11/2016 0911  Office Visit from 11/26/2017 in [facility] [place]  PHQ-9 Total Score  9      GAD 7 : Generalized Anxiety Score 11/26/2017 10/25/2017 09/11/2017 07/18/2017  Nervous, Anxious, on Edge '1 1 1 2  ' Control/stop worrying '1 1 2 2  ' Worry too much - different things '1 1 2 2  ' Trouble relaxing '1 1 1 2  ' Restless '1 1 3 ' 0  Easily annoyed or irritable 2 2 - 2  Afraid - awful might happen '1 2 1 2  ' Total GAD 7 Score 8 9 - 12     Assessment & Plan:   1. Pure hypercholesterolemia Lipid panel today and will adjust regimen accordingly - atorvastatin (LIPITOR) 20 MG tablet; Take 1 tablet (20 mg total) by mouth daily.  Dispense: 30 tablet; Refill: 6  2. Anxiety PHQ 9 score of 9 and GAD 7 score of 9 Not fully controlled due to ongoing family stresses He will benefit from counseling session LCSW called in for therapy - busPIRone (BUSPAR) 15 MG tablet; Take 1 tablet (15 mg total) by mouth 2 (two) times daily.  Dispense: 60 tablet; Refill: 6  3. Seasonal allergic rhinitis due to other allergic trigger Stable - cetirizine (ZYRTEC) 10 MG tablet; Take 1 tablet (10 mg total) by mouth daily.  Dispense: 30 tablet; Refill: 6  4. Essential hypertension Controlled Counseled on blood pressure goal of less than 130/80, low-sodium, DASH diet, medication compliance, 150 minutes of moderate intensity exercise per week. Discussed  medication compliance, adverse effects. - CMP14+EGFR - Lipid panel - hydrochlorothiazide (HYDRODIURIL) 25 MG tablet; Take 1 tablet (25 mg total) by mouth daily.  Dispense: 30 tablet; Refill: 6  5. Gastroesophageal reflux disease, esophagitis presence not specified Improved Doing well on PPI  6. Chronic pain of both knees Uncontrolled Previously referred for x-rays which he never underwent He is not open to any injections at this time Will refer for physical therapy - DG Knee Complete 4 Views Left; Future - DG Knee Complete 4 Views Right; Future - Ambulatory referral to Physical Therapy   Meds ordered this encounter  Medications  . atorvastatin (LIPITOR) 20 MG tablet    Sig: Take 1 tablet (20 mg total) by mouth daily.    Dispense:  30 tablet    Refill:  6  . busPIRone (BUSPAR) 15 MG tablet    Sig: Take 1 tablet (15 mg total) by mouth 2 (two) times daily.    Dispense:  60 tablet    Refill:  6  . cetirizine (ZYRTEC) 10 MG tablet    Sig: Take 1 tablet (10 mg total) by mouth daily.    Dispense:  30 tablet    Refill:  6    Please consider 90 day supplies to promote better adherence  . gabapentin (NEURONTIN) 300 MG capsule    Sig: Take 1 capsule (300 mg total) by mouth at bedtime.    Dispense:  30 capsule    Refill:  6  . hydrochlorothiazide (HYDRODIURIL) 25 MG tablet    Sig: Take 1 tablet (25 mg total) by mouth daily.    Dispense:  30 tablet    Refill:  6    Follow-up: Return in about 6 months (around 05/28/2018) for Follow-up of chronic medical conditions.   Charlott Rakes MD

## 2017-11-27 LAB — CMP14+EGFR
ALT: 26 IU/L (ref 0–44)
AST: 23 IU/L (ref 0–40)
Albumin/Globulin Ratio: 1.7 (ref 1.2–2.2)
Albumin: 4.2 g/dL (ref 3.5–5.5)
Alkaline Phosphatase: 98 IU/L (ref 39–117)
BUN/Creatinine Ratio: 7 — ABNORMAL LOW (ref 9–20)
BUN: 9 mg/dL (ref 6–24)
Bilirubin Total: 0.3 mg/dL (ref 0.0–1.2)
CO2: 25 mmol/L (ref 20–29)
Calcium: 9.1 mg/dL (ref 8.7–10.2)
Chloride: 100 mmol/L (ref 96–106)
Creatinine, Ser: 1.22 mg/dL (ref 0.76–1.27)
GFR calc Af Amer: 82 mL/min/{1.73_m2} (ref >59)
GFR calc non Af Amer: 71 mL/min/{1.73_m2} (ref >59)
Globulin, Total: 2.5 g/dL (ref 1.5–4.5)
Glucose: 84 mg/dL (ref 65–99)
Potassium: 3.8 mmol/L (ref 3.5–5.2)
Sodium: 141 mmol/L (ref 134–144)
Total Protein: 6.7 g/dL (ref 6.0–8.5)

## 2017-11-27 LAB — LIPID PANEL
Chol/HDL Ratio: 3.7 ratio (ref 0.0–5.0)
Cholesterol, Total: 104 mg/dL (ref 100–199)
HDL: 28 mg/dL — ABNORMAL LOW (ref >39)
LDL Calculated: 20 mg/dL (ref 0–99)
Triglycerides: 281 mg/dL — ABNORMAL HIGH (ref 0–149)
VLDL Cholesterol Cal: 56 mg/dL — ABNORMAL HIGH (ref 5–40)

## 2017-11-28 ENCOUNTER — Telehealth: Payer: Self-pay

## 2017-11-28 NOTE — BH Specialist Note (Signed)
Integrated Behavioral Health Initial Visit  MRN: [id number] Name: Timothy Mccarthy  Number of [facility] Clinician visits:: 1/6 Session Start time: 12:00 PM  Session End time: 12:30 PM Total time: 30 minutes  Type of Service: [place] Interpretor:No. Interpretor Name and Language: N/A   Warm Hand Off Completed.       SUBJECTIVE: Timothy Mccarthy is a 47 y.o. male accompanied by self Patient was referred by Dr. Margarita Rana for anxiety. Patient reports the following symptoms/concerns: feelings of sadness and worry, difficulty sleeping, low energy, decreased concentration, irritability, and hx of suicidal ideations Duration of problem: Ongoing, Pt shared that he has been diagnosed with PTSD, Depression, and Anxiety "some years ago"; Severity of problem: moderate  OBJECTIVE: Mood: Anxious and Affect: Appropriate Risk of harm to self or others: No plan to harm self or others Pt scored positive on phq9; however, denied current SI/HI. Protective factors were identified, safety plan was discussed, and crisis intervention resources were provided.  LIFE CONTEXT: Family and Social: Pt receives support from family and friends School/Work: Pt is employed full time (works 6-7 days a week) Self-Care: Pt states that he does not practice a lot of self care activities due to always working. He is participating in medication management through PCP Life Changes: Pt has difficulty managing physical and mental health due to ongoing stressors  GOALS ADDRESSED: Patient will: 1. Reduce symptoms of: anxiety and depression 2. Increase knowledge and/or ability of: coping skills and healthy habits  3. Demonstrate ability to: Increase adequate support systems for patient/family and Increase motivation to adhere to plan of care  INTERVENTIONS: Interventions utilized: Mindfulness or Psychologist, educational, Supportive Counseling, Psychoeducation and/or Health  Education and Link to [employer]  Standardized Assessments completed: GAD-7 and PHQ 2&9 with C-SSRS  ASSESSMENT: Patient currently experiencing depression and anxiety triggered by difficulty managing ongoing medical conditions and stressors regarding family. He reports feelings of sadness and worry, difficulty sleeping, low energy, decreased concentration, irritability, and hx of suicidal ideations. Pt scored positive on phq9; however, denied current SI/HI. Protective factors were identified, safety plan was discussed, and crisis intervention resources were provided.   Patient participates in medication management through PCP. [place] educated pt on correlation between one's physical and mental health, in addition, to how stress can negatively impact both. Mindfulness strategies were discussed and pt agreed to utilize skills on a weekly basis. LCSWA taught pt SMART goals to assist with motivation to adhere to plan.   PLAN: 1. Follow up with behavioral health clinician on : Pt was encouraged to contact LCSWA if symptoms worsen or fail to improve to schedule behavioral appointments at [facility]. 2. Behavioral recommendations: LCSWA recommends that pt apply healthy coping skills discussed and utilize provided resources. Pt is encouraged to schedule follow up appointment with LCSWA 3. Referral(s): [facility] (In Clinic) 4. "From scale of 1-10, how likely are you to follow plan?":   Rebekah [place]field, LCSW 11/29/17 11:47 AM

## 2017-11-28 NOTE — Telephone Encounter (Signed)
Reported to pt that cholesterol level overall has lowered but his triglycerides, which is a "bad" part has elevated..  Advised on low cholesterol diet and exercise that the doctor ordered.

## 2017-11-29 ENCOUNTER — Telehealth: Payer: Self-pay

## 2017-11-29 NOTE — Telephone Encounter (Signed)
Gave X ray results to pt and pass along doctors comments.  Encouraged pt to be consistent and diligent in following program to get full benefits.  He agreed and is aware and first appt with Pt is on 10/28.

## 2017-12-09 ENCOUNTER — Encounter: Payer: Self-pay | Admitting: Physical Therapy

## 2017-12-09 ENCOUNTER — Other Ambulatory Visit: Payer: Self-pay

## 2017-12-09 ENCOUNTER — Ambulatory Visit: Payer: BLUE CROSS/BLUE SHIELD | Attending: Family Medicine | Admitting: Physical Therapy

## 2017-12-09 DIAGNOSIS — R600 Localized edema: Secondary | ICD-10-CM | POA: Insufficient documentation

## 2017-12-09 DIAGNOSIS — M25562 Pain in left knee: Secondary | ICD-10-CM | POA: Insufficient documentation

## 2017-12-09 DIAGNOSIS — R2689 Other abnormalities of gait and mobility: Secondary | ICD-10-CM | POA: Insufficient documentation

## 2017-12-09 DIAGNOSIS — G8929 Other chronic pain: Secondary | ICD-10-CM | POA: Diagnosis not present

## 2017-12-09 DIAGNOSIS — M25561 Pain in right knee: Secondary | ICD-10-CM | POA: Diagnosis not present

## 2017-12-09 NOTE — Therapy (Signed)
[place], [place], [address] Phone: [phone]   Fax:  [phone]  Physical Therapy Evaluation  Patient Details  Name: Timothy Mccarthy MRN: [id number] Date of Birth: [birth year] Referring Provider (PT): Charlott Rakes MD   Encounter Date: 12/09/2017  PT End of Session - 12/09/17 1327    Visit Number  1    Number of Visits  13    Date for PT Re-Evaluation  01/20/18    Authorization Type  BCBS    PT Start Time  1330    PT Stop Time  1416    PT Time Calculation (min)  46 min    Activity Tolerance  Patient tolerated treatment well    Behavior During Therapy  [facility] for tasks assessed/performed       Past Medical History:  Diagnosis Date  . Adjustment disorder with anxious mood   . Anxiety   . Arthritis    "hands" (09/28/2015)  . Depression   . GERD (gastroesophageal reflux disease)   . Hypertension   . Tobacco use disorder     Past Surgical History:  Procedure Laterality Date  . APPENDECTOMY  09/15/2015  . CARPAL TUNNEL RELEASE Bilateral   . INCISIONAL HERNIA REPAIR N/A 09/29/2015   Procedure: HERNIA REPAIR INCISIONAL;  Surgeon: Erroll Luna, MD;  Location: [place];  Service: General;  Laterality: N/A;  . LAPAROSCOPIC APPENDECTOMY N/A 09/15/2015   Procedure: APPENDECTOMY LAPAROSCOPIC;  Surgeon: Judeth Horn, MD;  Location: [place];  Service: General;  Laterality: N/A;  . LUMPS ON BACK  2013  . WISDOM TOOTH EXTRACTION      There were no vitals filed for this visit.   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 reports no preivous hx of knee problems prior to this start. Numbness in the thigh and the knees, that occurs all the time and worse when it rains.  since onset the pain and numbness in knees is getting worse. denies  and back red flags.     Pertinent History  hx of OA    Limitations  Standing;Lifting;Sitting    How long can you sit comfortably?  60-90 min     How long can you stand comfortably?  unsure    How long can you walk comfortably?  30 min     Diagnostic tests  11/27/2015 X-ray    Patient Stated Goals  to get back to doing normal stuff, reduce numbness/ pain,     Currently in Pain?  Yes    Pain Score  5     Pain Location  Knee    Pain Orientation  Right;Left    Pain Descriptors / Indicators  Throbbing;Aching;Numbness    Pain Type  Chronic pain    Pain Radiating Towards  in the distal quad to the knees    Pain Onset  More than a month ago    Pain Frequency  Intermittent    Aggravating Factors   prolonged sitting, stairs, weight bearing activities    Pain Relieving Factors  resting, sitting, epsom salt and grain alcohol.     Effect of Pain on Daily Activities  limited standing/ walking, stability         [facility] PT Assessment - 12/09/17 1328  Assessment   Medical Diagnosis  bil knee pain     Referring Provider (PT)  Charlott Rakes MD    Onset Date/Surgical Date  --   the last couple of years   Hand Dominance  Left    Next MD Visit  --   3 months   Prior Therapy  low back       Precautions   Precautions  None      Restrictions   Weight Bearing Restrictions  No      Balance Screen   Has the patient fallen in the past 6 months  No    Has the patient had a decrease in activity level because of a fear of falling?   No    Is the patient reluctant to leave their home because of a fear of falling?   No      Home Environment   Living Environment  Private residence    Living Arrangements  Spouse/significant other    Available Help at Discharge  Family;Available PRN/intermittently    Type of Home  House    Home Access  Stairs to enter    Entrance Stairs-Number of Steps  4    Entrance Stairs-Rails  Can reach both    Home Layout  One level    [place] - 2  wheels;Cane - single point;Crutches      Prior Function   Level of Independence  Independent    Vocation  Full time employment   Therapist, music at [employer], stairs    Leisure  being with grandchildren, bike riding, exercise.       Cognition   Overall Cognitive Status  Within Functional Limits for tasks assessed      Observation/Other Assessments   Focus on Therapeutic Outcomes (FOTO)   52% limited    predicted 37% limited     Posture/Postural Control   Posture/Postural Control  Postural limitations    Postural Limitations  Rounded Shoulders;Forward head      ROM / Strength   AROM / PROM / Strength  AROM;PROM;Strength      AROM   AROM Assessment Site  Knee    Right/Left Knee  Right;Left    Right Knee Extension  4    Right Knee Flexion  120   ERP    Left Knee Extension  3    Left Knee Flexion  118   ERP     Strength   Strength Assessment Site  Hip;Knee    Right/Left Hip  Right;Left    Right Hip Flexion  4/5   tried lifting leg with hands    Right Hip External Rotation   4/5    Right Hip ABduction  3+/5    Left Hip Flexion  4/5    Left Hip External Rotation  4/5    Left Hip ADduction  3+/5    Right/Left Knee  Right;Left    Right Knee Flexion  4/5    Right Knee Extension  5/5   pain during testing   Left Knee Flexion  4/5    Left Knee Extension  5/5   pain during testing     Palpation   Patella mobility  bil patella track laterally with quad activation    Palpation comment  TTP along R patellar tendon, patellar fat pad, along bil medial/ lateral poles of the patella.       Special Tests    Special Tests  Knee Special Tests    Knee Special tests   Step-up/Step Down Test;Lateral Pull Sign      Lateral Pull Sign    Findings  Positive    Side  --   bil     Step-up/Step Down    Findings  Positive    Side   --   bil     Ambulation/Gait   Ambulation/Gait  Yes                Objective measurements  completed on examination: See above findings.      [facility] Adult PT Treatment/Exercise - 12/09/17 1328      Exercises   Exercises  Knee/Hip      Knee/Hip Exercises: Supine   Short Arc Quad Sets  2 sets;10 reps;Strengthening;Both   with pillow squeeze     Knee/Hip Exercises: Sidelying   Hip ABduction  1 set;10 reps;Both;Strengthening      Manual Therapy   Manual therapy comments  MTPR along the vastus lateralis x 3             PT Education - 12/09/17 1328    Education Details  evaluation findings, POC, goals, HEP with proper form/ rationale.     Person(s) Educated  Patient    Methods  Explanation;Verbal cues;Handout;Demonstration    Comprehension  Verbalized understanding;Verbal cues required;Returned demonstration       PT Short Term Goals - 12/09/17 1539      PT SHORT TERM GOAL #1   Title  pt to be I with inital HEP     Time  3    Period  Weeks    Status  New    Target Date  12/30/17      PT SHORT TERM GOAL #2   Title  pt to verbalize and demo proper posture and gait mechanics/ stair mechanics to prevent and reduce bil knee pain     Time  3    Period  Weeks    Status  New    Target Date  12/30/17        PT Long Term Goals - 12/09/17 1540      PT LONG TERM GOAL #1   Title  improve bil LE strength to >/= 4+/5 to promote knee stability with walking / standing reporting </= 2/10 pain during testing    Time  6    Period  Weeks    Status  New    Target Date  01/20/18      PT LONG TERM GOAL #2   Title  pt to be able to sit, stand and walk >/= 45 min with </= 2/10 pain for functional endurance required for ADLS and work releated tasks     Time  6    Period  Weeks    Status  New    Target Date  01/20/18      PT LONG TERM GOAL #3   Title  increase FOTO score to </= 37% limited to demo improvement in function     Time  6    Period  Weeks    Status  New    Target Date  01/20/18      PT LONG TERM GOAL #4   Title  pt to return to working out with report  of </= 2/10 pain for pt's goal of return to exercise    Time  6    Period  Weeks    Status  New    Target  Date  01/20/18      PT LONG TERM GOAL #5   Title  pt to be I with all HEP given as of last visit to maintain and progress current level of function     Time  6    Period  Weeks    Status  New    Target Date  01/20/18             Plan - 12/09/17 1445    Clinical Impression Statement  pt is a 47 y.o M presenting to OPPT  with CC of chronic bil knee pain starting a couple years ago with no specific onset. He demonstrates functional knee ROM with end range soreness noted with flexion/ extension, with weakness with hip MMT. TTP along bil patella R>L with lateral gliding with quad activation. Following MTPR on the R and McConnel taping on the L followed with VMO activation pt noted significant improvement in pain.     History and Personal Factors relevant to plan of care:  hx of OA, and depression    Clinical Presentation  Evolving    Clinical Presentation due to:  chronic pain that is worsening in nature, report of Numbness in the thigh, swelling in the R knee    Clinical Decision Making  Moderate    Rehab Potential  Good    PT Frequency  2x / week    PT Duration  6 weeks    PT Treatment/Interventions  ADLs/Self Care Home Management;Cryotherapy;Electrical Stimulation;Iontophoresis 4mg /ml Dexamethasone;Moist Heat;Ultrasound;Therapeutic exercise;Therapeutic activities;Manual techniques;Vasopneumatic Device;Taping;Dry needling;Passive range of motion;Stair training;Gait training;Balance training;Neuromuscular re-education    PT Next Visit Plan  review/ update HEP, PFPS, vmo activation, STW along the vastus lateralis, hip abductor/ ER strengthening, modalities PRN    PT Home Exercise Plan  hip flexor stretch, SAQ with ball/ pillow squeeze,     Consulted and Agree with Plan of Care  Patient       Patient will benefit from skilled therapeutic intervention in order to improve the  following deficits and impairments:  Pain, Increased muscle spasms, Decreased strength, Decreased balance, Postural dysfunction, Improper body mechanics, Abnormal gait, Decreased activity tolerance, Decreased range of motion, Increased fascial restricitons, Increased edema  Visit Diagnosis: Chronic pain of right knee  Chronic pain of left knee  Other abnormalities of gait and mobility  Localized edema     Problem List Patient Active Problem List   Diagnosis Date Noted  . Chronic pain of both knees 11/26/2017  . Hyperlipidemia 12/25/2016  . Anxiety 06/06/2016  . Essential hypertension 11/04/2015  . Abdominal hernia 09/28/2015  . Appendicitis 09/15/2015  . Tobacco use disorder 04/21/2015  . Adjustment disorder with anxious mood 04/21/2015  . Health care maintenance 04/21/2015  . GERD (gastroesophageal reflux disease) 04/21/2015   [name] PT, DPT, LAT, ATC  12/09/17  3:43 PM      [place] [facility] [address] [place], [place], [address] Phone: [phone]   Fax:  [phone]  Name: Timothy Mccarthy MRN: [id number] Date of Birth: [birth year]

## 2017-12-17 ENCOUNTER — Encounter: Payer: Self-pay | Admitting: Physical Therapy

## 2017-12-17 ENCOUNTER — Ambulatory Visit: Payer: BLUE CROSS/BLUE SHIELD | Attending: Family Medicine | Admitting: Physical Therapy

## 2017-12-17 DIAGNOSIS — R2689 Other abnormalities of gait and mobility: Secondary | ICD-10-CM

## 2017-12-17 DIAGNOSIS — G8929 Other chronic pain: Secondary | ICD-10-CM

## 2017-12-17 DIAGNOSIS — M25562 Pain in left knee: Secondary | ICD-10-CM | POA: Diagnosis not present

## 2017-12-17 DIAGNOSIS — R600 Localized edema: Secondary | ICD-10-CM | POA: Insufficient documentation

## 2017-12-17 DIAGNOSIS — M25561 Pain in right knee: Secondary | ICD-10-CM | POA: Diagnosis not present

## 2017-12-17 NOTE — Therapy (Signed)
[place], [place], [address] Phone: [phone]   Fax:  [phone]  Physical Therapy Treatment  Patient Details  Name: Timothy Mccarthy MRN: [id number] Date of Birth: 1970-07-30 Referring Provider (PT): Charlott Rakes MD   Encounter Date: 12/17/2017  PT End of Session - 12/17/17 1333    Visit Number  2    Number of Visits  13    Date for PT Re-Evaluation  01/20/18    Authorization Type  BCBS    PT Start Time  1331    PT Stop Time  1409    PT Time Calculation (min)  38 min    Activity Tolerance  Patient tolerated treatment well    Behavior During Therapy  [facility] for tasks assessed/performed       Past Medical History:  Diagnosis Date  . Adjustment disorder with anxious mood   . Anxiety   . Arthritis    "hands" (09/28/2015)  . Depression   . GERD (gastroesophageal reflux disease)   . Hypertension   . Tobacco use disorder     Past Surgical History:  Procedure Laterality Date  . APPENDECTOMY  09/15/2015  . CARPAL TUNNEL RELEASE Bilateral   . INCISIONAL HERNIA REPAIR N/A 09/29/2015   Procedure: HERNIA REPAIR INCISIONAL;  Surgeon: Erroll Luna, MD;  Location: [place];  Service: General;  Laterality: N/A;  . LAPAROSCOPIC APPENDECTOMY N/A 09/15/2015   Procedure: APPENDECTOMY LAPAROSCOPIC;  Surgeon: Judeth Horn, MD;  Location: [place];  Service: General;  Laterality: N/A;  . LUMPS ON BACK  2013  . WISDOM TOOTH EXTRACTION      There were no vitals filed for this visit.  Subjective Assessment - 12/17/17 1334    Subjective  "been doing the exercises feeling better, some stiffness in the L knee more than right"    Currently in Pain?  No/denies    Pain Location  Knee    Pain Orientation  Right                       [facility] Adult PT Treatment/Exercise - 12/17/17 0001      Exercises   Exercises  Knee/Hip      Knee/Hip Exercises: Aerobic   Nustep  L5 x 5 min LE only      Knee/Hip  Exercises: Seated   Sit to Sand  10 reps   x 2 sets with ball between knees, with controlled eccentrics     Knee/Hip Exercises: Supine   Short Arc Quad Sets  2 sets   going to fatigue with ball squeeze for VMO activation   Short Arc Quad Sets Limitations  3#      Knee/Hip Exercises: Sidelying   Hip ABduction  2 sets;15 reps;Strengthening;Both   3#   Other Sidelying Knee/Hip Exercises  R and L hip external rotation 2 x 10 with 3#      Manual Therapy   Manual Therapy  Taping    Manual therapy comments  MTPR along the vastus lateralis x 3    McConnell  bill latera >medial taping             PT Education - 12/17/17 1359    Education Details  reviewed previously HEP    Person(s) Educated  Patient    Methods  Explanation;Verbal cues    Comprehension  Verbalized understanding;Verbal cues required       PT Short Term Goals - 12/09/17 1539  PT SHORT TERM GOAL #1   Title  pt to be I with inital HEP     Time  3    Period  Weeks    Status  New    Target Date  12/30/17      PT SHORT TERM GOAL #2   Title  pt to verbalize and demo proper posture and gait mechanics/ stair mechanics to prevent and reduce bil knee pain     Time  3    Period  Weeks    Status  New    Target Date  12/30/17        PT Long Term Goals - 12/09/17 1540      PT LONG TERM GOAL #1   Title  improve bil LE strength to >/= 4+/5 to promote knee stability with walking / standing reporting </= 2/10 pain during testing    Time  6    Period  Weeks    Status  New    Target Date  01/20/18      PT LONG TERM GOAL #2   Title  pt to be able to sit, stand and walk >/= 45 min with </= 2/10 pain for functional endurance required for ADLS and work releated tasks     Time  6    Period  Weeks    Status  New    Target Date  01/20/18      PT LONG TERM GOAL #3   Title  increase FOTO score to </= 37% limited to demo improvement in function     Time  6    Period  Weeks    Status  New    Target Date  01/20/18       PT LONG TERM GOAL #4   Title  pt to return to working out with report of </= 2/10 pain for pt's goal of return to exercise    Time  6    Period  Weeks    Status  New    Target Date  01/20/18      PT LONG TERM GOAL #5   Title  pt to be I with all HEP given as of last visit to maintain and progress current level of function     Time  6    Period  Weeks    Status  New    Target Date  01/20/18            Plan - 12/17/17 1357    Clinical Impression Statement  pt reports consistency with his HEP and reports improvement of bil knee pain only CC was L knee stiffness. continued hip / knee strengthening with emphasis on Patellofemoral stabilization, continued [employer] taping bil knees to promote proper patellar positioning. end of session he reported no pain and declined modalities.     PT Treatment/Interventions  ADLs/Self Care Home Management;Cryotherapy;Electrical Stimulation;Iontophoresis 4mg /ml Dexamethasone;Moist Heat;Ultrasound;Therapeutic exercise;Therapeutic activities;Manual techniques;Vasopneumatic Device;Taping;Dry needling;Passive range of motion;Stair training;Gait training;Balance training;Neuromuscular re-education    PT Next Visit Plan  review/ update HEP, PFPS, vmo activation, STW along the vastus lateralis, hip abductor/ ER strengthening, modalities PRN    PT Home Exercise Plan  hip flexor stretch, SAQ with ball/ pillow squeeze,     Consulted and Agree with Plan of Care  Patient       Patient will benefit from skilled therapeutic intervention in order to improve the following deficits and impairments:  Pain, Increased muscle spasms, Decreased strength, Decreased balance, Postural dysfunction, Improper body mechanics,  Abnormal gait, Decreased activity tolerance, Decreased range of motion, Increased fascial restricitons, Increased edema  Visit Diagnosis: Chronic pain of right knee  Chronic pain of left knee  Other abnormalities of gait and mobility  Localized  edema     Problem List Patient Active Problem List   Diagnosis Date Noted  . Chronic pain of both knees 11/26/2017  . Hyperlipidemia 12/25/2016  . Anxiety 06/06/2016  . Essential hypertension 11/04/2015  . Abdominal hernia 09/28/2015  . Appendicitis 09/15/2015  . Tobacco use disorder 04/21/2015  . Adjustment disorder with anxious mood 04/21/2015  . Health care maintenance 04/21/2015  . GERD (gastroesophageal reflux disease) 04/21/2015   [name] PT, DPT, LAT, ATC  12/17/17  2:14 PM      [place] [facility] [address] [place], [place], [address] Phone: [phone]   Fax:  [phone]  Name: Timothy Mccarthy MRN: [id number] Date of Birth: 1970-07-07

## 2017-12-20 ENCOUNTER — Encounter

## 2017-12-23 ENCOUNTER — Ambulatory Visit: Payer: BLUE CROSS/BLUE SHIELD | Admitting: Physical Therapy

## 2017-12-23 DIAGNOSIS — M25561 Pain in right knee: Principal | ICD-10-CM

## 2017-12-23 DIAGNOSIS — M25562 Pain in left knee: Secondary | ICD-10-CM

## 2017-12-23 DIAGNOSIS — R600 Localized edema: Secondary | ICD-10-CM

## 2017-12-23 DIAGNOSIS — R2689 Other abnormalities of gait and mobility: Secondary | ICD-10-CM

## 2017-12-23 DIAGNOSIS — G8929 Other chronic pain: Secondary | ICD-10-CM | POA: Diagnosis not present

## 2017-12-23 NOTE — Therapy (Signed)
[place], [place], [address] Phone: [phone]   Fax:  [phone]  Physical Therapy Treatment  Patient Details  Name: Timothy Mccarthy MRN: [id number] Date of Birth: 02/12/1971 Referring Provider (PT): Charlott Rakes MD   Encounter Date: 12/23/2017  PT End of Session - 12/23/17 1515    Visit Number  3    Number of Visits  13    Date for PT Re-Evaluation  01/20/18    Authorization Type  BCBS    PT Start Time  6203   pt arrived 12 min late today   PT Stop Time  1541    PT Time Calculation (min)  28 min    Activity Tolerance  Patient tolerated treatment well    Behavior During Therapy  [facility] for tasks assessed/performed       Past Medical History:  Diagnosis Date  . Adjustment disorder with anxious mood   . Anxiety   . Arthritis    "hands" (09/28/2015)  . Depression   . GERD (gastroesophageal reflux disease)   . Hypertension   . Tobacco use disorder     Past Surgical History:  Procedure Laterality Date  . APPENDECTOMY  09/15/2015  . CARPAL TUNNEL RELEASE Bilateral   . INCISIONAL HERNIA REPAIR N/A 09/29/2015   Procedure: HERNIA REPAIR INCISIONAL;  Surgeon: Erroll Luna, MD;  Location: [place];  Service: General;  Laterality: N/A;  . LAPAROSCOPIC APPENDECTOMY N/A 09/15/2015   Procedure: APPENDECTOMY LAPAROSCOPIC;  Surgeon: Judeth Horn, MD;  Location: [place];  Service: General;  Laterality: N/A;  . LUMPS ON BACK  2013  . WISDOM TOOTH EXTRACTION      There were no vitals filed for this visit.  Subjective Assessment - 12/23/17 1515    Currently in Pain?  Yes    Pain Score  2     Pain Orientation  Right    Pain Type  Chronic pain    Pain Onset  More than a month ago    Pain Frequency  Intermittent                       [facility] Adult PT Treatment/Exercise - 12/23/17 1517      Self-Care   Self-Care  Other Self-Care Comments    Other Self-Care Comments   DTM using a roller to calm  down the muscle tightness in the quad      Knee/Hip Exercises: Aerobic   Recumbent Bike  L3 x 5 min       Knee/Hip Exercises: Standing   Heel Raises  2 sets;20 reps   with inverions for posterior tib strengthening   Hip Abduction  Both;1 set;Knee straight   going to fatigue with green theraband around the ankles   Stairs  up /down 6 inch steps reciprocally keeping the knee inline with the foot avoiding medial collapse. pt reported no  pain and ease of activity.       Knee/Hip Exercises: Seated   Other Seated Knee/Hip Exercises  bil hip ER 2 x 15 with green theraband             PT Education - 12/23/17 1540    Education Details  updated HEP for posterior tib strength    Person(s) Educated  Patient    Methods  Explanation;Handout;Verbal cues    Comprehension  Verbalized understanding;Verbal cues required       PT Short Term Goals - 12/09/17 1539  PT SHORT TERM GOAL #1   Title  pt to be I with inital HEP     Time  3    Period  Weeks    Status  New    Target Date  12/30/17      PT SHORT TERM GOAL #2   Title  pt to verbalize and demo proper posture and gait mechanics/ stair mechanics to prevent and reduce bil knee pain     Time  3    Period  Weeks    Status  New    Target Date  12/30/17        PT Long Term Goals - 12/09/17 1540      PT LONG TERM GOAL #1   Title  improve bil LE strength to >/= 4+/5 to promote knee stability with walking / standing reporting </= 2/10 pain during testing    Time  6    Period  Weeks    Status  New    Target Date  01/20/18      PT LONG TERM GOAL #2   Title  pt to be able to sit, stand and walk >/= 45 min with </= 2/10 pain for functional endurance required for ADLS and work releated tasks     Time  6    Period  Weeks    Status  New    Target Date  01/20/18      PT LONG TERM GOAL #3   Title  increase FOTO score to </= 37% limited to demo improvement in function     Time  6    Period  Weeks    Status  New    Target Date   01/20/18      PT LONG TERM GOAL #4   Title  pt to return to working out with report of </= 2/10 pain for pt's goal of return to exercise    Time  6    Period  Weeks    Status  New    Target Date  01/20/18      PT LONG TERM GOAL #5   Title  pt to be I with all HEP given as of last visit to maintain and progress current level of function     Time  6    Period  Weeks    Status  New    Target Date  01/20/18            Plan - 12/23/17 1528    Clinical Impression Statement  pt arrived 12 min late. due to limited time focused on hip/ knee strengthening and updated HEP for posterior tib strengthening. practiced stair training to prevent medial collapse when navigating up/ down steps. no pain reported at end of session.    PT Treatment/Interventions  ADLs/Self Care Home Management;Cryotherapy;Electrical Stimulation;Iontophoresis 4mg /ml Dexamethasone;Moist Heat;Ultrasound;Therapeutic exercise;Therapeutic activities;Manual techniques;Vasopneumatic Device;Taping;Dry needling;Passive range of motion;Stair training;Gait training;Balance training;Neuromuscular re-education    PT Next Visit Plan  review/ update HEP, PFPS, vmo activation, STW along the vastus lateralis, hip abductor/ ER strengthening, modalities PRN    PT Home Exercise Plan  hip flexor stretch, SAQ with ball/ pillow squeeze, posterior tib strengthening.     Consulted and Agree with Plan of Care  Patient       Patient will benefit from skilled therapeutic intervention in order to improve the following deficits and impairments:  Pain, Increased muscle spasms, Decreased strength, Decreased balance, Postural dysfunction, Improper body mechanics, Abnormal gait, Decreased activity tolerance, Decreased range  of motion, Increased fascial restricitons, Increased edema  Visit Diagnosis: Chronic pain of right knee  Chronic pain of left knee  Other abnormalities of gait and mobility  Localized edema     Problem List Patient Active  Problem List   Diagnosis Date Noted  . Chronic pain of both knees 11/26/2017  . Hyperlipidemia 12/25/2016  . Anxiety 06/06/2016  . Essential hypertension 11/04/2015  . Abdominal hernia 09/28/2015  . Appendicitis 09/15/2015  . Tobacco use disorder 04/21/2015  . Adjustment disorder with anxious mood 04/21/2015  . Health care maintenance 04/21/2015  . GERD (gastroesophageal reflux disease) 04/21/2015   [name] PT, DPT, LAT, ATC  12/23/17  3:45 PM      [place] [facility] [address] [place], [place], [address] Phone: [phone]   Fax:  [phone]  Name: Timothy Mccarthy MRN: [id number] Date of Birth: 1970-09-10

## 2017-12-24 ENCOUNTER — Other Ambulatory Visit: Payer: Self-pay | Admitting: Nurse Practitioner

## 2017-12-24 DIAGNOSIS — K219 Gastro-esophageal reflux disease without esophagitis: Secondary | ICD-10-CM

## 2017-12-25 ENCOUNTER — Encounter: Payer: Self-pay | Admitting: Physical Therapy

## 2017-12-25 ENCOUNTER — Ambulatory Visit: Payer: BLUE CROSS/BLUE SHIELD | Admitting: Physical Therapy

## 2017-12-25 DIAGNOSIS — M25561 Pain in right knee: Principal | ICD-10-CM

## 2017-12-25 DIAGNOSIS — G8929 Other chronic pain: Secondary | ICD-10-CM

## 2017-12-25 DIAGNOSIS — M25562 Pain in left knee: Secondary | ICD-10-CM | POA: Diagnosis not present

## 2017-12-25 DIAGNOSIS — R600 Localized edema: Secondary | ICD-10-CM | POA: Diagnosis not present

## 2017-12-25 DIAGNOSIS — R2689 Other abnormalities of gait and mobility: Secondary | ICD-10-CM | POA: Diagnosis not present

## 2017-12-25 NOTE — Therapy (Signed)
[place], [place], [address] Phone: [phone]   Fax:  [phone]  Physical Therapy Treatment  Patient Details  Name: Timothy Mccarthy MRN: [id number] Date of Birth: 1970/03/07 Referring Provider (PT): Charlott Rakes MD   Encounter Date: 12/25/2017  PT End of Session - 12/25/17 1639    Visit Number  4    Number of Visits  13    Date for PT Re-Evaluation  01/20/18    Authorization Type  BCBS    PT Start Time  2878   pt arrived 8 min late today   PT Stop Time  1710    PT Time Calculation (min)  31 min    Activity Tolerance  Patient tolerated treatment well    Behavior During Therapy  [facility] for tasks assessed/performed       Past Medical History:  Diagnosis Date  . Adjustment disorder with anxious mood   . Anxiety   . Arthritis    "hands" (09/28/2015)  . Depression   . GERD (gastroesophageal reflux disease)   . Hypertension   . Tobacco use disorder     Past Surgical History:  Procedure Laterality Date  . APPENDECTOMY  09/15/2015  . CARPAL TUNNEL RELEASE Bilateral   . INCISIONAL HERNIA REPAIR N/A 09/29/2015   Procedure: HERNIA REPAIR INCISIONAL;  Surgeon: Erroll Luna, MD;  Location: [place];  Service: General;  Laterality: N/A;  . LAPAROSCOPIC APPENDECTOMY N/A 09/15/2015   Procedure: APPENDECTOMY LAPAROSCOPIC;  Surgeon: Judeth Horn, MD;  Location: [place];  Service: General;  Laterality: N/A;  . LUMPS ON BACK  2013  . WISDOM TOOTH EXTRACTION      There were no vitals filed for this visit.  Subjective Assessment - 12/25/17 1639    Subjective  "I am aching more today which I think is more the weather"     Patient Stated Goals  to get back to doing normal stuff, reduce numbness/ pain,     Currently in Pain?  Yes    Pain Score  7     Pain Orientation  Right    Pain Descriptors / Indicators  Aching    Pain Onset  More than a month ago    Pain Frequency  Intermittent    Aggravating Factors   cold  weather    Pain Relieving Factors  sitting/ resting, epsom salt                       [facility] Adult PT Treatment/Exercise - 12/25/17 0001      Exercises   Exercises  Knee/Hip;Ankle      Knee/Hip Exercises: Stretches   Sports administrator  2 reps;30 seconds      Knee/Hip Exercises: Aerobic   Recumbent Bike  L3 x 5 min       Ankle Exercises: Standing   Other Standing Ankle Exercises  arch building on airex pad 2 x 20      Ankle Exercises: Seated   Towel Crunch  5 reps    Heel Raises  --   1 x 50 with inverions for posterior tib strength         Balance Exercises - 12/25/17 1716      Balance Exercises: Standing   Standing Eyes Opened  Narrow base of support (BOS);2 reps;30 secs;Foam/compliant surface    Standing Eyes Closed  Narrow base of support (BOS);2 reps;30 secs;Foam/compliant surface    Tandem Stance  2 reps;30 secs;Foam/compliant surface;Eyes open  PT Short Term Goals - 12/09/17 1539      PT SHORT TERM GOAL #1   Title  pt to be I with inital HEP     Time  3    Period  Weeks    Status  New    Target Date  12/30/17      PT SHORT TERM GOAL #2   Title  pt to verbalize and demo proper posture and gait mechanics/ stair mechanics to prevent and reduce bil knee pain     Time  3    Period  Weeks    Status  New    Target Date  12/30/17        PT Long Term Goals - 12/09/17 1540      PT LONG TERM GOAL #1   Title  improve bil LE strength to >/= 4+/5 to promote knee stability with walking / standing reporting </= 2/10 pain during testing    Time  6    Period  Weeks    Status  New    Target Date  01/20/18      PT LONG TERM GOAL #2   Title  pt to be able to sit, stand and walk >/= 45 min with </= 2/10 pain for functional endurance required for ADLS and work releated tasks     Time  6    Period  Weeks    Status  New    Target Date  01/20/18      PT LONG TERM GOAL #3   Title  increase FOTO score to </= 37% limited to demo improvement in  function     Time  6    Period  Weeks    Status  New    Target Date  01/20/18      PT LONG TERM GOAL #4   Title  pt to return to working out with report of </= 2/10 pain for pt's goal of return to exercise    Time  6    Period  Weeks    Status  New    Target Date  01/20/18      PT LONG TERM GOAL #5   Title  pt to be I with all HEP given as of last visit to maintain and progress current level of function     Time  6    Period  Weeks    Status  New    Target Date  01/20/18            Plan - 12/25/17 1711    Clinical Impression Statement  pt arrived 8 min late today reporting increased pain at 7/10 which he attributed to the cold today. continued working  on strengthening with focus on ankle / arch strengthening support. began balance training which he demonstrated increased postural sway with narrow BOS. He reported no pain end of session.     PT Treatment/Interventions  ADLs/Self Care Home Management;Cryotherapy;Electrical Stimulation;Iontophoresis 4mg /ml Dexamethasone;Moist Heat;Ultrasound;Therapeutic exercise;Therapeutic activities;Manual techniques;Vasopneumatic Device;Taping;Dry needling;Passive range of motion;Stair training;Gait training;Balance training;Neuromuscular re-education    PT Next Visit Plan  update HEP, PFPS, vmo activation, STW along the vastus lateralis, hip abductor/ ER strengthening, modalities PRN    PT Home Exercise Plan  hip flexor stretch, SAQ with ball/ pillow squeeze, posterior tib strengthening.     Consulted and Agree with Plan of Care  Patient       Patient will benefit from skilled therapeutic intervention in order to improve the following deficits and  impairments:  Pain, Increased muscle spasms, Decreased strength, Decreased balance, Postural dysfunction, Improper body mechanics, Abnormal gait, Decreased activity tolerance, Decreased range of motion, Increased fascial restricitons, Increased edema  Visit Diagnosis: Chronic pain of right  knee  Chronic pain of left knee  Other abnormalities of gait and mobility  Localized edema     Problem List Patient Active Problem List   Diagnosis Date Noted  . Chronic pain of both knees 11/26/2017  . Hyperlipidemia 12/25/2016  . Anxiety 06/06/2016  . Essential hypertension 11/04/2015  . Abdominal hernia 09/28/2015  . Appendicitis 09/15/2015  . Tobacco use disorder 04/21/2015  . Adjustment disorder with anxious mood 04/21/2015  . Health care maintenance 04/21/2015  . GERD (gastroesophageal reflux disease) 04/21/2015   [name] PT, DPT, LAT, ATC  12/25/17  5:20 PM      [place] [facility] [address] [place], [place], [address] Phone: [phone]   Fax:  [phone]  Name: JOSHAUA EPPLE MRN: [id number] Date of Birth: 1970-08-23

## 2017-12-31 ENCOUNTER — Telehealth: Payer: Self-pay | Admitting: Physical Therapy

## 2017-12-31 ENCOUNTER — Ambulatory Visit: Payer: BLUE CROSS/BLUE SHIELD | Admitting: Physical Therapy

## 2017-12-31 NOTE — Telephone Encounter (Signed)
Called patient about missed visit.  He had to pick his mother up from the hospital and take her to the rest home without notice.  He plans to attend his next appointment.  Date and time of next appointment given. Melvenia Needles PTA

## 2018-01-02 ENCOUNTER — Ambulatory Visit: Payer: BLUE CROSS/BLUE SHIELD | Admitting: Physical Therapy

## 2018-01-02 ENCOUNTER — Encounter: Payer: Self-pay | Admitting: Physical Therapy

## 2018-01-02 DIAGNOSIS — R600 Localized edema: Secondary | ICD-10-CM

## 2018-01-02 DIAGNOSIS — G8929 Other chronic pain: Secondary | ICD-10-CM

## 2018-01-02 DIAGNOSIS — M25562 Pain in left knee: Secondary | ICD-10-CM | POA: Diagnosis not present

## 2018-01-02 DIAGNOSIS — R2689 Other abnormalities of gait and mobility: Secondary | ICD-10-CM

## 2018-01-02 DIAGNOSIS — M25561 Pain in right knee: Secondary | ICD-10-CM | POA: Diagnosis not present

## 2018-01-02 NOTE — Therapy (Addendum)
[place], [place], [address] Phone: [phone]   Fax:  [phone]  Physical Therapy Treatment / Discharge Summary  Patient Details  Name: Timothy Mccarthy MRN: [id number] Date of Birth: 1970/03/19 Referring Provider (PT): Timothy Rakes MD   Encounter Date: 01/02/2018  PT End of Session - 01/02/18 1427    Visit Number  5    Number of Visits  13    Date for PT Re-Evaluation  01/20/18    Authorization Type  [employer]    PT Start Time  1427   pt arrived 12 min late   PT Stop Time  1508    PT Time Calculation (min)  41 min    Activity Tolerance  Patient tolerated treatment well    Behavior During Therapy  [facility] for tasks assessed/performed       Past Medical History:  Diagnosis Date  . Adjustment disorder with anxious mood   . Anxiety   . Arthritis    "hands" (09/28/2015)  . Depression   . GERD (gastroesophageal reflux disease)   . Hypertension   . Tobacco use disorder     Past Surgical History:  Procedure Laterality Date  . APPENDECTOMY  09/15/2015  . CARPAL TUNNEL RELEASE Bilateral   . INCISIONAL HERNIA REPAIR N/A 09/29/2015   Procedure: HERNIA REPAIR INCISIONAL;  Surgeon: Timothy Luna, MD;  Location: [place];  Service: General;  Laterality: N/A;  . LAPAROSCOPIC APPENDECTOMY N/A 09/15/2015   Procedure: APPENDECTOMY LAPAROSCOPIC;  Surgeon: Timothy Horn, MD;  Location: [place];  Service: General;  Laterality: N/A;  . LUMPS ON BACK  2013  . WISDOM TOOTH EXTRACTION      There were no vitals filed for this visit.  Subjective Assessment - 01/02/18 1428    Subjective  "The other day I had some soreness which I think could be due to the way I was sleeping"     Currently in Pain?  Yes    Pain Score  7     Pain Orientation  Right    Pain Descriptors / Indicators  Aching    Pain Type  Chronic pain    Pain Frequency  Intermittent    Aggravating Factors   cold weather, sleeping position    Pain Relieving  Factors  sitting/ resting, epsom salt                       [facility] Adult PT Treatment/Exercise - 01/02/18 0001      Exercises   Exercises  Knee/Hip;Ankle      Knee/Hip Exercises: Stretches   Active Hamstring Stretch  2 reps;30 seconds    Quad Stretch  2 reps;30 seconds      Knee/Hip Exercises: Aerobic   Nustep  L5 x 5 min LE only      Knee/Hip Exercises: Supine   Quad Sets  2 sets;10 reps   with ball squeeze, holding 5 sec each   Heel Slides  Strengthening;Both;2 sets;10 reps    Bridges  2 sets;10 reps      Modalities   Modalities  Moist Heat      Moist Heat Therapy   Number Minutes Moist Heat  10 Minutes    Moist Heat Location  Knee   bil            PT Education - 01/02/18 1449    Education Details  pain education about movement helps reduce pain and decresae stiffness, and discussed benefits of  movement     Person(s) Educated  Patient    Methods  Explanation;Verbal cues    Comprehension  Verbalized understanding;Verbal cues required       PT Short Term Goals - 12/09/17 1539      PT SHORT TERM GOAL #1   Title  pt to be I with inital HEP     Time  3    Period  Weeks    Status  New    Target Date  12/30/17      PT SHORT TERM GOAL #2   Title  pt to verbalize and demo proper posture and gait mechanics/ stair mechanics to prevent and reduce bil knee pain     Time  3    Period  Weeks    Status  New    Target Date  12/30/17        PT Long Term Goals - 12/09/17 1540      PT LONG TERM GOAL #1   Title  improve bil LE strength to >/= 4+/5 to promote knee stability with walking / standing reporting </= 2/10 pain during testing    Time  6    Period  Weeks    Status  New    Target Date  01/20/18      PT LONG TERM GOAL #2   Title  pt to be able to sit, stand and walk >/= 45 min with </= 2/10 pain for functional endurance required for ADLS and work releated tasks     Time  6    Period  Weeks    Status  New    Target Date  01/20/18       PT LONG TERM GOAL #3   Title  increase FOTO score to </= 37% limited to demo improvement in function     Time  6    Period  Weeks    Status  New    Target Date  01/20/18      PT LONG TERM GOAL #4   Title  pt to return to working out with report of </= 2/10 pain for pt's goal of return to exercise    Time  6    Period  Weeks    Status  New    Target Date  01/20/18      PT LONG TERM GOAL #5   Title  pt to be I with all HEP given as of last visit to maintain and progress current level of function     Time  6    Period  Weeks    Status  New    Target Date  01/20/18            Plan - 01/02/18 1446    Clinical Impression Statement  pt arrived 12 min late today reporting increased soreness today which further assessment pt revealed doing alot of lifting and carry a refrigerator and other large equpiment up steps which likely contributed to his pain. cotinued working on strengthening which was performed in supine to calm down pain which he repoted pain decreased with exercise.        Patient will benefit from skilled therapeutic intervention in order to improve the following deficits and impairments:  Pain, Increased muscle spasms, Decreased strength, Decreased balance, Postural dysfunction, Improper body mechanics, Abnormal gait, Decreased activity tolerance, Decreased range of motion, Increased fascial restricitons, Increased edema  Visit Diagnosis: Chronic pain of right knee  Chronic pain of left knee  Other abnormalities  of gait and mobility  Localized edema     Problem List Patient Active Problem List   Diagnosis Date Noted  . Chronic pain of both knees 11/26/2017  . Hyperlipidemia 12/25/2016  . Anxiety 06/06/2016  . Essential hypertension 11/04/2015  . Abdominal hernia 09/28/2015  . Appendicitis 09/15/2015  . Tobacco use disorder 04/21/2015  . Adjustment disorder with anxious mood 04/21/2015  . Health care maintenance 04/21/2015  . GERD (gastroesophageal  reflux disease) 04/21/2015   Timothy Mccarthy PT, DPT, LAT, ATC  01/02/18  2:56 PM      [facility] [address] [place], [place], [address] Phone: [phone]   Fax:  [phone]  Name: Timothy Mccarthy MRN: [id number] Date of Birth: [birth year]      PHYSICAL THERAPY DISCHARGE SUMMARY  Visits from Start of Care: 5  Current functional level related to goals / functional outcomes: See goals   Remaining deficits: unknown   Education / Equipment: HEP, posture, theraband  Plan: Patient agrees to discharge.  Patient goals were not met. Patient is being discharged due to not returning since the last visit.  ?????          Lyle Niblett PT, DPT, LAT, ATC  02/10/18  11:36 AM

## 2018-01-06 ENCOUNTER — Telehealth: Payer: Self-pay | Admitting: Physical Therapy

## 2018-01-06 ENCOUNTER — Ambulatory Visit: Payer: BLUE CROSS/BLUE SHIELD | Admitting: Physical Therapy

## 2018-01-06 NOTE — Telephone Encounter (Signed)
Left voice message about missed visit today.  Date and time of next visit given.  Phone number given for patient to call if he is unable to attend or if he is no longer interested in PT.  I asked him to refer to the policy for missed visits. Melvenia Needles PTA

## 2018-01-08 ENCOUNTER — Ambulatory Visit: Payer: BLUE CROSS/BLUE SHIELD | Admitting: Physical Therapy

## 2018-01-30 ENCOUNTER — Other Ambulatory Visit: Payer: Self-pay | Admitting: Nurse Practitioner

## 2018-01-30 DIAGNOSIS — G8929 Other chronic pain: Secondary | ICD-10-CM

## 2018-01-30 DIAGNOSIS — M25562 Pain in left knee: Principal | ICD-10-CM

## 2018-01-30 DIAGNOSIS — M25561 Pain in right knee: Principal | ICD-10-CM

## 2018-02-04 ENCOUNTER — Emergency Department (HOSPITAL_COMMUNITY)
Admission: EM | Admit: 2018-02-04 | Discharge: 2018-02-04 | Disposition: A | Discharge status: Home / Self Care | Payer: BLUE CROSS/BLUE SHIELD | Attending: Emergency Medicine | Admitting: Emergency Medicine

## 2018-02-04 ENCOUNTER — Encounter (HOSPITAL_COMMUNITY): Payer: Self-pay | Admitting: Pharmacy Technician

## 2018-02-04 ENCOUNTER — Other Ambulatory Visit: Payer: Self-pay

## 2018-02-04 DIAGNOSIS — W268XXA Contact with other sharp object(s), not elsewhere classified, initial encounter: Secondary | ICD-10-CM | POA: Insufficient documentation

## 2018-02-04 DIAGNOSIS — Y999 Unspecified external cause status: Secondary | ICD-10-CM | POA: Insufficient documentation

## 2018-02-04 DIAGNOSIS — I10 Essential (primary) hypertension: Secondary | ICD-10-CM | POA: Insufficient documentation

## 2018-02-04 DIAGNOSIS — F17210 Nicotine dependence, cigarettes, uncomplicated: Secondary | ICD-10-CM | POA: Insufficient documentation

## 2018-02-04 DIAGNOSIS — S61411A Laceration without foreign body of right hand, initial encounter: Secondary | ICD-10-CM | POA: Insufficient documentation

## 2018-02-04 DIAGNOSIS — Z79899 Other long term (current) drug therapy: Secondary | ICD-10-CM | POA: Insufficient documentation

## 2018-02-04 DIAGNOSIS — Y929 Unspecified place or not applicable: Secondary | ICD-10-CM | POA: Insufficient documentation

## 2018-02-04 DIAGNOSIS — Y93G1 Activity, food preparation and clean up: Secondary | ICD-10-CM | POA: Insufficient documentation

## 2018-02-04 NOTE — ED Notes (Signed)
Patient verbalizes understanding of discharge instructions. Opportunity for questioning and answers were provided. 

## 2018-02-04 NOTE — ED Notes (Signed)
Hand soaking in betadine/sterile water solution.

## 2018-02-04 NOTE — ED Provider Notes (Signed)
[facility] EMERGENCY DEPARTMENT Provider Note   CSN: [id number] Arrival date & time: 02/04/18  1754     History   Chief Complaint Chief Complaint  Patient presents with  . Hand Injury    HPI Timothy Mccarthy is a 47 y.o. male.  HPI  Patient is a 47 year old male with history of anxiety, depression, GERD, hypertension, presents emergency department today complaining of an injury to the right hand.  Patient states that he cut his hand on a meat slicer prior to arrival and has a laceration the right hand.  Bleeding is present.  Denies numbness to the hand.  Is able to move the hand without difficulty.  His Tdap was updated last year.  Pain is constant and severe in nature.  Exacerbated with movement of the hand.  Past Medical History:  Diagnosis Date  . Adjustment disorder with anxious mood   . Anxiety   . Arthritis    "hands" (09/28/2015)  . Depression   . GERD (gastroesophageal reflux disease)   . Hypertension   . Tobacco use disorder     Patient Active Problem List   Diagnosis Date Noted  . Chronic pain of both knees 11/26/2017  . Hyperlipidemia 12/25/2016  . Anxiety 06/06/2016  . Essential hypertension 11/04/2015  . Abdominal hernia 09/28/2015  . Appendicitis 09/15/2015  . Tobacco use disorder 04/21/2015  . Adjustment disorder with anxious mood 04/21/2015  . Health care maintenance 04/21/2015  . GERD (gastroesophageal reflux disease) 04/21/2015    Past Surgical History:  Procedure Laterality Date  . APPENDECTOMY  09/15/2015  . CARPAL TUNNEL RELEASE Bilateral   . INCISIONAL HERNIA REPAIR N/A 09/29/2015   Procedure: HERNIA REPAIR INCISIONAL;  Surgeon: Erroll Luna, MD;  Location: [facility];  Service: General;  Laterality: N/A;  . LAPAROSCOPIC APPENDECTOMY N/A 09/15/2015   Procedure: APPENDECTOMY LAPAROSCOPIC;  Surgeon: Judeth Horn, MD;  Location: [facility];  Service: General;  Laterality: N/A;  . LUMPS ON BACK  2013  . WISDOM TOOTH EXTRACTION           Home Medications    Prior to Admission medications   Medication Sig Start Date End Date Taking? Authorizing Provider  atorvastatin (LIPITOR) 20 MG tablet Take 1 tablet (20 mg total) by mouth daily. 11/26/17   Charlott Rakes, MD  busPIRone (BUSPAR) 15 MG tablet Take 1 tablet (15 mg total) by mouth 2 (two) times daily. 11/26/17   Charlott Rakes, MD  calcium carbonate (TUMS - DOSED IN MG ELEMENTAL CALCIUM) 500 MG chewable tablet Chew 2 tablets by mouth daily.    [provider]  cetirizine (ZYRTEC) 10 MG tablet Take 1 tablet (10 mg total) by mouth daily. 11/26/17   Charlott Rakes, MD  fluticasone (FLONASE) 50 MCG/ACT nasal spray Place 2 sprays into both nostrils daily. 09/11/17   Argentina Donovan, PA-C  gabapentin (NEURONTIN) 300 MG capsule Take 1 capsule (300 mg total) by mouth at bedtime. 11/26/17   Charlott Rakes, MD  hydrochlorothiazide (HYDRODIURIL) 25 MG tablet Take 1 tablet (25 mg total) by mouth daily. 11/26/17   Charlott Rakes, MD  ibuprofen (ADVIL,MOTRIN) 600 MG tablet Take 1 tablet (600 mg total) by mouth every 8 (eight) hours as needed for moderate pain. 09/11/17   Argentina Donovan, PA-C  meloxicam (MOBIC) 15 MG tablet TAKE 1 TABLET BY MOUTH DAILY 01/31/18   Charlott Rakes, MD  Multiple Vitamin (ONE-A-DAY MENS PO) Take 2 tablets by mouth daily.    [provider]  nicotine (NICODERM  CQ - DOSED IN MG/24 HOURS) 14 mg/24hr patch Place 1 patch (14 mg total) daily onto the skin. 12/25/16   Charlott Rakes, MD  nicotine polacrilex (NICORETTE) 4 MG gum Take 1 each (4 mg total) by mouth as needed for smoking cessation. 04/21/15   Loleta Chance, MD  omeprazole (PRILOSEC) 40 MG capsule TAKE 1 CAPSULE BY MOUTH  DAILY 12/24/17   Gildardo Pounds, NP  predniSONE (DELTASONE) 20 MG tablet Take 1 tablet (20 mg total) by mouth daily with breakfast. 11/26/17   Charlott Rakes, MD    Family History Family History  Problem Relation Age of Onset  . Stroke Mother   .  Diabetes type II Mother     Social History Social History   Tobacco Use  . Smoking status: Current Every Day Smoker    Packs/day: 0.50    Years: 23.00    Pack years: 11.50    Types: Cigarettes    Start date: 04/21/1987  . Smokeless tobacco: Never Used  . Tobacco comment: Patches   Substance Use Topics  . Alcohol use: Yes    Alcohol/week: 2.0 standard drinks    Types: 2 Cans of beer per week    Comment: "drink only on the weekends"  . Drug use: No     Allergies   Penicillins   Review of Systems Review of Systems  Constitutional: Negative for fever.  Musculoskeletal:       Right hand pain  Skin: Positive for wound.  Neurological: Negative for numbness.     Physical Exam Updated Vital Signs BP 132/89 (BP Location: Left Arm)   Pulse (!) 106   Temp 98.3 F (36.8 C) (Oral)   Resp 18   SpO2 98%   Physical Exam Constitutional:      General: He is not in acute distress.    Appearance: He is well-developed.  Eyes:     Conjunctiva/sclera: Conjunctivae normal.  Cardiovascular:     Rate and Rhythm: Normal rate and regular rhythm.  Pulmonary:     Effort: Pulmonary effort is normal.     Breath sounds: Normal breath sounds.  Musculoskeletal:     Comments: 1 cm x 2 cm superficial wound to the left hand.  5/5 strength with grip strength bilaterally.  Able to move all fingers without difficulty.  Brisk cap refill to all fingers bilaterally.  Neurovascularly intact bilaterally.  Skin:    General: Skin is warm and dry.  Neurological:     Mental Status: He is alert and oriented to person, place, and time.      ED Treatments / Results  Labs (all labs ordered are listed, but only abnormal results are displayed) Labs Reviewed - No data to display  EKG None  Radiology No results found.  Procedures Procedures (including critical care time)  Medications Ordered in ED Medications - No data to display   Initial Impression / Assessment and Plan / ED Course  I  have reviewed the triage vital signs and the nursing notes.  Pertinent labs & imaging results that were available during my care of the patient were reviewed by me and considered in my medical decision making (see chart for details).     Final Clinical Impressions(s) / ED Diagnoses   Final diagnoses:  Laceration of right hand, foreign body presence unspecified, initial encounter   Patient presenting with wound to the left hand that occurred after slicing his hand in a meat slicer prior to arrival.  Wound is superficial in nature  and it appears the top layer of skin has been shaved off.  Unable to close wound via suture repair, will allow wound to heal by secondary intention.  Wound was irrigated and cleansed with Betadine in the ED.  Quick lock was applied and pressure dressing was applied.  There is no bleeding through the dressing after period of observation.  Advised patient to keep quick clot on for 24 hours and remove this tomorrow.  Will advised him to apply nonadherent dressings as well as bacitracin to prevent infection.  Advised to follow with PCP and return the ER for new or worsening symptoms including infection.  He voices understanding of the plan and reasons to return.  All questions answered.  ED Discharge Orders    None       Bishop Dublin 02/04/18 1953    Lajean Saver, MD 02/10/18 1530

## 2018-02-04 NOTE — ED Triage Notes (Signed)
Pt arrives via POV with reports of cutting R hand while slicing meat. Last tetanus was last year. Bleeding controlled.

## 2018-02-04 NOTE — Discharge Instructions (Signed)
Please keep the wound dressing on for the next 24 hours.  Tomorrow, remove the dressing and apply bacitracin ointment and a nonadherent dressing.  If the wound continues to bleed, apply pressure for 20 to 30 minutes.  If the wound continues to bleed then you may return to the emergency department for follow-up.  Monitor your wound for signs of infection.  Please keep the wound clean to help prevent infection.  Follow-up with your regular doctor in 5 to 7 days for reevaluation return to the ER for new or worsening symptoms.

## 2018-02-04 NOTE — ED Notes (Signed)
quik clot applied

## 2018-02-04 NOTE — ED Notes (Signed)
Pt hand continues to bleed.

## 2018-02-18 NOTE — Progress Notes (Signed)
Patient ID: Timothy Mccarthy, male   DOB: April 22, 1970, 48 y.o.   MRN: [id number]     Timothy Mccarthy, is a 48 y.o. male  FYB:[id number]  NID:[id number]  DOB - [birth year]  Subjective:  Chief Complaint and HPI: Timothy Mccarthy is a 48 y.o. male here today for a follow up visit After being seen in the ED 02/04/2018 for an injury to his R hand.   His tdap had been updated ~2018.  From ED note: Patient presenting with wound to the left hand that occurred after slicing his hand in a meat slicer prior to arrival.  Wound is superficial in nature and it appears the top layer of skin has been shaved off.  Unable to close wound via suture repair, will allow wound to heal by secondary intention.  Wound was irrigated and cleansed with Betadine in the ED.  Quick lock was applied and pressure dressing was applied.  There is no bleeding through the dressing after period of observation.  Advised patient to keep quick clot on for 24 hours and remove this tomorrow.  Will advised him to apply nonadherent dressings as well as bacitracin to prevent infection.  Advised to follow with PCP and return the ER for new or worsening symptoms including infection.  He voices understanding of the plan and reasons to return.  All questions answered.  ED/Hospital notes reviewed.    ROS:   Constitutional:  No f/c, No night sweats, No unexplained weight loss. EENT:  No vision changes, No blurry vision, No hearing changes. No mouth, throat, or ear problems.  Respiratory: No cough, No SOB Cardiac: No CP, no palpitations GI:  No abd pain, No N/V/D. GU: No Urinary s/sx Musculoskeletal: No joint pain Neuro: No headache, no dizziness, no motor weakness.  Skin: No rash Endocrine:  No polydipsia. No polyuria.  Psych: Denies SI/HI  No problems updated.  ALLERGIES: Allergies  Allergen Reactions  . Penicillins Anaphylaxis    Has patient had a PCN reaction causing immediate rash, facial/tongue/throat swelling, SOB or lightheadedness with  hypotensionNO Has patient had a PCN reaction causing severe rash involving mucus membranes or skin necrosis: NO Has patient had a PCN reaction that required hospitalization NO Has patient had a PCN reaction occurring within the last 10 years: NO If all of the above answers are "NO", then may proceed with Cephalosporin use.    PAST MEDICAL HISTORY: Past Medical History:  Diagnosis Date  . Adjustment disorder with anxious mood   . Anxiety   . Arthritis    "hands" (09/28/2015)  . Depression   . GERD (gastroesophageal reflux disease)   . Hypertension   . Tobacco use disorder     MEDICATIONS AT HOME: Prior to Admission medications   Medication Sig Start Date End Date Taking? Authorizing Provider  atorvastatin (LIPITOR) 20 MG tablet Take 1 tablet (20 mg total) by mouth daily. 11/26/17   Charlott Rakes, MD  busPIRone (BUSPAR) 15 MG tablet Take 1 tablet (15 mg total) by mouth 2 (two) times daily. 11/26/17   Charlott Rakes, MD  calcium carbonate (TUMS - DOSED IN MG ELEMENTAL CALCIUM) 500 MG chewable tablet Chew 2 tablets by mouth daily.    [provider]  cetirizine (ZYRTEC) 10 MG tablet Take 1 tablet (10 mg total) by mouth daily. 11/26/17   Charlott Rakes, MD  fluticasone (FLONASE) 50 MCG/ACT nasal spray Place 2 sprays into both nostrils daily. 09/11/17   Argentina Donovan, PA-C  gabapentin (NEURONTIN) 300 MG capsule Take 1  capsule (300 mg total) by mouth at bedtime. 11/26/17   Charlott Rakes, MD  hydrochlorothiazide (HYDRODIURIL) 25 MG tablet Take 1 tablet (25 mg total) by mouth daily. 11/26/17   Charlott Rakes, MD  ibuprofen (ADVIL,MOTRIN) 600 MG tablet Take 1 tablet (600 mg total) by mouth every 8 (eight) hours as needed for moderate pain. 09/11/17   Argentina Donovan, PA-C  meloxicam (MOBIC) 15 MG tablet TAKE 1 TABLET BY MOUTH DAILY 01/31/18   Charlott Rakes, MD  Multiple Vitamin (ONE-A-DAY MENS PO) Take 2 tablets by mouth daily.    [provider]  nicotine  (NICODERM CQ - DOSED IN MG/24 HOURS) 14 mg/24hr patch Place 1 patch (14 mg total) daily onto the skin. 12/25/16   Charlott Rakes, MD  nicotine polacrilex (NICORETTE) 4 MG gum Take 1 each (4 mg total) by mouth as needed for smoking cessation. 04/21/15   Loleta Chance, MD  omeprazole (PRILOSEC) 40 MG capsule TAKE 1 CAPSULE BY MOUTH  DAILY 12/24/17   Gildardo Pounds, NP     Objective:  EXAM:   Vitals:   02/19/18 1540  BP: 129/88  Pulse: 86  Resp: 16  Temp: 98.3 F (36.8 C)  SpO2: 96%  Weight: 213 lb 12.8 oz (97 kg)    General appearance : A&OX3. NAD. Non-toxic-appearing HEENT: Atraumatic and Normocephalic.  PERRLA. EOM intact.   Chest/Lungs:  Breathing-non-labored, Good air entry bilaterally, breath sounds normal without rales, rhonchi, or wheezing  CVS: S1 S2 regular, no murmurs, gallops, rubs  R hand-thenar eminence-wound healing well-still small area w/o filled in skin but no sign of infection.  N-V intact.   Neurology:  CN II-XII grossly intact, Non focal.   Psych:  TP linear. J/I WNL. Normal speech. Appropriate eye contact and affect.  Skin:  No Rash  Data Review Lab Results  Component Value Date   HGBA1C 6.0 (H) 03/27/2017   HGBA1C 5.8 09/12/2016     Assessment & Plan   1. Open wound of right hand without foreign body, unspecified wound type, subsequent encounter Healing-cover to protect while working-extra bandages given.    2. Encounter for examination following treatment at hospital Improving.    Patient have been counseled extensively about nutrition and exercise  Return in about 2 months (around 04/20/2018) for cpe with Dr Margarita Rana.  The patient was given clear instructions to go to ER or return to medical center if symptoms don't improve, worsen or new problems develop. The patient verbalized understanding. The patient was told to call to get lab results if they haven't heard anything in the next week.     Freeman Caldron, PA-C [facility] and  [place], [place]   02/19/2018, 3:58 PM

## 2018-02-19 ENCOUNTER — Ambulatory Visit: Payer: BLUE CROSS/BLUE SHIELD | Attending: Family Medicine | Admitting: Physician Assistant

## 2018-02-19 VITALS — BP 129/88 | HR 86 | Temp 98.3°F | Resp 16 | Wt 213.8 lb

## 2018-02-19 DIAGNOSIS — W268XXA Contact with other sharp object(s), not elsewhere classified, initial encounter: Secondary | ICD-10-CM | POA: Diagnosis not present

## 2018-02-19 DIAGNOSIS — S61401A Unspecified open wound of right hand, initial encounter: Secondary | ICD-10-CM

## 2018-02-19 DIAGNOSIS — Z09 Encounter for follow-up examination after completed treatment for conditions other than malignant neoplasm: Secondary | ICD-10-CM

## 2018-02-19 DIAGNOSIS — S61401D Unspecified open wound of right hand, subsequent encounter: Secondary | ICD-10-CM

## 2018-02-24 DIAGNOSIS — E876 Hypokalemia: Secondary | ICD-10-CM | POA: Diagnosis not present

## 2018-02-24 DIAGNOSIS — R197 Diarrhea, unspecified: Secondary | ICD-10-CM | POA: Diagnosis not present

## 2018-02-24 DIAGNOSIS — R112 Nausea with vomiting, unspecified: Secondary | ICD-10-CM | POA: Diagnosis not present

## 2018-02-25 ENCOUNTER — Encounter (HOSPITAL_COMMUNITY): Payer: Self-pay | Admitting: Emergency Medicine

## 2018-02-25 ENCOUNTER — Other Ambulatory Visit: Payer: Self-pay

## 2018-02-25 ENCOUNTER — Other Ambulatory Visit: Payer: Self-pay | Admitting: Nurse Practitioner

## 2018-02-25 ENCOUNTER — Emergency Department (HOSPITAL_COMMUNITY)
Admission: EM | Admit: 2018-02-25 | Discharge: 2018-02-25 | Disposition: A | Discharge status: Home / Self Care | Payer: BLUE CROSS/BLUE SHIELD | Attending: Emergency Medicine | Admitting: Emergency Medicine

## 2018-02-25 ENCOUNTER — Emergency Department (HOSPITAL_COMMUNITY): Payer: BLUE CROSS/BLUE SHIELD

## 2018-02-25 DIAGNOSIS — R197 Diarrhea, unspecified: Secondary | ICD-10-CM | POA: Insufficient documentation

## 2018-02-25 DIAGNOSIS — R112 Nausea with vomiting, unspecified: Secondary | ICD-10-CM

## 2018-02-25 DIAGNOSIS — I10 Essential (primary) hypertension: Secondary | ICD-10-CM | POA: Diagnosis not present

## 2018-02-25 DIAGNOSIS — K219 Gastro-esophageal reflux disease without esophagitis: Secondary | ICD-10-CM

## 2018-02-25 DIAGNOSIS — E876 Hypokalemia: Secondary | ICD-10-CM | POA: Insufficient documentation

## 2018-02-25 DIAGNOSIS — F17210 Nicotine dependence, cigarettes, uncomplicated: Secondary | ICD-10-CM | POA: Insufficient documentation

## 2018-02-25 DIAGNOSIS — K5730 Diverticulosis of large intestine without perforation or abscess without bleeding: Secondary | ICD-10-CM | POA: Diagnosis not present

## 2018-02-25 DIAGNOSIS — K429 Umbilical hernia without obstruction or gangrene: Secondary | ICD-10-CM | POA: Diagnosis not present

## 2018-02-25 DIAGNOSIS — R1032 Left lower quadrant pain: Secondary | ICD-10-CM | POA: Diagnosis not present

## 2018-02-25 DIAGNOSIS — Z79899 Other long term (current) drug therapy: Secondary | ICD-10-CM | POA: Insufficient documentation

## 2018-02-25 LAB — CBC
HCT: 46.3 % (ref 39.0–52.0)
Hemoglobin: 15.3 g/dL (ref 13.0–17.0)
MCH: 27.7 pg (ref 26.0–34.0)
MCHC: 33 g/dL (ref 30.0–36.0)
MCV: 83.9 fL (ref 80.0–100.0)
Platelets: 306 10*3/uL (ref 150–400)
RBC: 5.52 MIL/uL (ref 4.22–5.81)
RDW: 13.7 % (ref 11.5–15.5)
WBC: 11.7 10*3/uL — ABNORMAL HIGH (ref 4.0–10.5)
nRBC: 0 % (ref 0.0–0.2)

## 2018-02-25 LAB — COMPREHENSIVE METABOLIC PANEL
ALT: 24 U/L (ref 0–44)
AST: 28 U/L (ref 15–41)
Albumin: 4.4 g/dL (ref 3.5–5.0)
Alkaline Phosphatase: 84 U/L (ref 38–126)
Anion gap: 13 (ref 5–15)
BUN: 16 mg/dL (ref 6–20)
CO2: 32 mmol/L (ref 22–32)
Calcium: 9.8 mg/dL (ref 8.9–10.3)
Chloride: 89 mmol/L — ABNORMAL LOW (ref 98–111)
Creatinine, Ser: 1.58 mg/dL — ABNORMAL HIGH (ref 0.61–1.24)
GFR calc Af Amer: 60 mL/min — ABNORMAL LOW (ref >60)
GFR calc non Af Amer: 52 mL/min — ABNORMAL LOW (ref >60)
Glucose, Bld: 125 mg/dL — ABNORMAL HIGH (ref 70–99)
Potassium: 2.6 mmol/L — CL (ref 3.5–5.1)
Sodium: 134 mmol/L — ABNORMAL LOW (ref 135–145)
Total Bilirubin: 0.6 mg/dL (ref 0.3–1.2)
Total Protein: 7.5 g/dL (ref 6.5–8.1)

## 2018-02-25 LAB — URINALYSIS, ROUTINE W REFLEX MICROSCOPIC
Bacteria, UA: NONE SEEN
Bilirubin Urine: NEGATIVE
Glucose, UA: NEGATIVE mg/dL
Hgb urine dipstick: NEGATIVE
Ketones, ur: NEGATIVE mg/dL
Leukocytes, UA: NEGATIVE
Nitrite: NEGATIVE
Protein, ur: 30 mg/dL — AB
Specific Gravity, Urine: 1.027 (ref 1.005–1.030)
pH: 6 (ref 5.0–8.0)

## 2018-02-25 LAB — LIPASE, BLOOD: Lipase: 25 U/L (ref 11–51)

## 2018-02-25 LAB — MAGNESIUM: Magnesium: 2.3 mg/dL (ref 1.7–2.4)

## 2018-02-25 MED ORDER — ONDANSETRON 4 MG PO TBDP: 4.0000 mg | ORAL_TABLET | Freq: Four times a day (QID) | ORAL | 0 refills | Status: DC | PRN

## 2018-02-25 MED ORDER — ONDANSETRON HCL 4 MG/2ML IJ SOLN
4.0000 mg | Freq: Once | INTRAMUSCULAR | Status: AC
  Administered 2018-02-25: 4 mg via INTRAVENOUS
  Filled 2018-02-25: qty 2

## 2018-02-25 MED ORDER — SODIUM CHLORIDE 0.9 % IV BOLUS (SEPSIS)
1000.0000 mL | Freq: Once | INTRAVENOUS | Status: AC
  Administered 2018-02-25: 1000 mL via INTRAVENOUS

## 2018-02-25 MED ORDER — POTASSIUM CHLORIDE CRYS ER 20 MEQ PO TBCR
40.0000 meq | EXTENDED_RELEASE_TABLET | Freq: Once | ORAL | Status: AC
  Administered 2018-02-25: 40 meq via ORAL
  Filled 2018-02-25: qty 2

## 2018-02-25 MED ORDER — POTASSIUM CHLORIDE 10 MEQ/100ML IV SOLN
10.0000 meq | INTRAVENOUS | Status: AC
  Administered 2018-02-25 (×2): 10 meq via INTRAVENOUS
  Filled 2018-02-25 (×2): qty 100

## 2018-02-25 MED ORDER — POTASSIUM CHLORIDE CRYS ER 20 MEQ PO TBCR: 20.0000 meq | EXTENDED_RELEASE_TABLET | Freq: Two times a day (BID) | ORAL | 0 refills | Status: DC

## 2018-02-25 MED ORDER — MORPHINE SULFATE (PF) 4 MG/ML IV SOLN
4.0000 mg | Freq: Once | INTRAVENOUS | Status: AC
  Administered 2018-02-25: 4 mg via INTRAVENOUS
  Filled 2018-02-25: qty 1

## 2018-02-25 MED ORDER — IOHEXOL 300 MG/ML  SOLN
100.0000 mL | Freq: Once | INTRAMUSCULAR | Status: AC | PRN
  Administered 2018-02-25: 100 mL via INTRAVENOUS

## 2018-02-25 NOTE — Discharge Instructions (Signed)
You may alternate Tylenol 1000 mg every 6 hours as needed for pain and Ibuprofen 800 mg every 8 hours as needed for pain.  Please take Ibuprofen with food.  You may take over-the-counter Imodium as needed for diarrhea.  You need to you need to have your potassium rechecked in 1 week.  It was low today at 2.6.  We have given you oral and IV replacement in the emergency department and are discharging you with a prescription of oral potassium tablets to take twice a day for the next 7 days.  Your magnesium level was normal at 2.3.  This is likely from frequent episodes of vomiting and diarrhea.  Your symptoms are likely caused by a viral illness.  Your CT scan of your abdomen pelvis today showed diverticulosis without diverticulitis.  It showed increase in the size of your hernia that was containing fat but no other acute abnormality.

## 2018-02-25 NOTE — ED Notes (Signed)
Pt given water for PO challenge. Tolerating well. Will continue to monitor.

## 2018-02-25 NOTE — ED Triage Notes (Signed)
Pt to ED with c/o generalized abd pain with nausea and vomiting x's 4 days.  Also c/o constipation. Last BM 4 days ago.

## 2018-02-25 NOTE — ED Notes (Signed)
Discharge instructions (including medications) discussed with and copy provided to patient/caregiver 

## 2018-02-25 NOTE — ED Provider Notes (Signed)
CHIEF COMPLAINT: Vomiting, diarrhea, abdominal pain  HPI: Patient is a 48 year old male with history of hypertension who presents to the emergency department with nausea, vomiting, diarrhea that started 3 to 4 days ago.  Mother with similar symptoms.  He is having sharp left lower quadrant pain.  No fevers, dysuria, hematuria.  History of appendectomy and hernia repair.  ROS: See HPI Constitutional: no fever  Eyes: no drainage  ENT: no runny nose   Cardiovascular:  no chest pain  Resp: no SOB  GI: Vomiting and diarrhea GU: no dysuria Integumentary: no rash  Allergy: no hives  Musculoskeletal: no leg swelling  Neurological: no slurred speech ROS otherwise negative  PAST MEDICAL HISTORY/PAST SURGICAL HISTORY:  Past Medical History:  Diagnosis Date  . Adjustment disorder with anxious mood   . Anxiety   . Arthritis    "hands" (09/28/2015)  . Depression   . GERD (gastroesophageal reflux disease)   . Hypertension   . Tobacco use disorder     MEDICATIONS:  Prior to Admission medications   Medication Sig Start Date End Date Taking? Authorizing Provider  atorvastatin (LIPITOR) 20 MG tablet Take 1 tablet (20 mg total) by mouth daily. 11/26/17   Charlott Rakes, MD  busPIRone (BUSPAR) 15 MG tablet Take 1 tablet (15 mg total) by mouth 2 (two) times daily. 11/26/17   Charlott Rakes, MD  calcium carbonate (TUMS - DOSED IN MG ELEMENTAL CALCIUM) 500 MG chewable tablet Chew 2 tablets by mouth daily.    [provider]  cetirizine (ZYRTEC) 10 MG tablet Take 1 tablet (10 mg total) by mouth daily. 11/26/17   Charlott Rakes, MD  fluticasone (FLONASE) 50 MCG/ACT nasal spray Place 2 sprays into both nostrils daily. 09/11/17   Argentina Donovan, PA-C  gabapentin (NEURONTIN) 300 MG capsule Take 1 capsule (300 mg total) by mouth at bedtime. 11/26/17   Charlott Rakes, MD  hydrochlorothiazide (HYDRODIURIL) 25 MG tablet Take 1 tablet (25 mg total) by mouth daily. 11/26/17   Charlott Rakes, MD   ibuprofen (ADVIL,MOTRIN) 600 MG tablet Take 1 tablet (600 mg total) by mouth every 8 (eight) hours as needed for moderate pain. 09/11/17   Argentina Donovan, PA-C  meloxicam (MOBIC) 15 MG tablet TAKE 1 TABLET BY MOUTH DAILY 01/31/18   Charlott Rakes, MD  Multiple Vitamin (ONE-A-DAY MENS PO) Take 2 tablets by mouth daily.    [provider]  nicotine (NICODERM CQ - DOSED IN MG/24 HOURS) 14 mg/24hr patch Place 1 patch (14 mg total) daily onto the skin. 12/25/16   Charlott Rakes, MD  nicotine polacrilex (NICORETTE) 4 MG gum Take 1 each (4 mg total) by mouth as needed for smoking cessation. 04/21/15   Loleta Chance, MD  omeprazole (PRILOSEC) 40 MG capsule TAKE 1 CAPSULE BY MOUTH  DAILY 12/24/17   Gildardo Pounds, NP    ALLERGIES:  Allergies  Allergen Reactions  . Penicillins Anaphylaxis    Has patient had a PCN reaction causing immediate rash, facial/tongue/throat swelling, SOB or lightheadedness with hypotensionNO Has patient had a PCN reaction causing severe rash involving mucus membranes or skin necrosis: NO Has patient had a PCN reaction that required hospitalization NO Has patient had a PCN reaction occurring within the last 10 years: NO If all of the above answers are "NO", then may proceed with Cephalosporin use.    SOCIAL HISTORY:  Social History   Tobacco Use  . Smoking status: Current Every Day Smoker    Packs/day: 0.50    Years:  23.00    Pack years: 11.50    Types: Cigarettes    Start date: 04/21/1987  . Smokeless tobacco: Never Used  . Tobacco comment: Patches   Substance Use Topics  . Alcohol use: Yes    Alcohol/week: 2.0 standard drinks    Types: 2 Cans of beer per week    Comment: "drink only on the weekends"    FAMILY HISTORY: Family History  Problem Relation Age of Onset  . Stroke Mother   . Diabetes type II Mother     EXAM: BP 131/83   Pulse 87   Temp 98.3 F (36.8 C) (Oral)   Resp (!) 21   Ht 5\' 7"  (1.702 m)   Wt 95.3 kg   SpO2 93%   BMI  32.89 kg/m  CONSTITUTIONAL: Alert and oriented and responds appropriately to questions. Well-appearing; well-nourished HEAD: Normocephalic EYES: Conjunctivae clear, pupils appear equal, EOMI ENT: normal nose; moist mucous membranes NECK: Supple, no meningismus, no nuchal rigidity, no LAD  CARD: RRR; S1 and S2 appreciated; no murmurs, no clicks, no rubs, no gallops RESP: Normal chest excursion without splinting or tachypnea; breath sounds clear and equal bilaterally; no wheezes, no rhonchi, no rales, no hypoxia or respiratory distress, speaking full sentences ABD/GI: Normal bowel sounds; non-distended; soft, tender to palpation in the left lower quadrant no rebound, no guarding, no peritoneal signs, no hepatosplenomegaly BACK:  The back appears normal and is non-tender to palpation, there is no CVA tenderness EXT: Normal ROM in all joints; non-tender to palpation; no edema; normal capillary refill; no cyanosis, no calf tenderness or swelling    SKIN: Normal color for age and race; warm; no rash NEURO: Moves all extremities equally PSYCH: The patient's mood and manner are appropriate. Grooming and personal hygiene are appropriate.  MEDICAL DECISION MAKING: Patient here with vomiting and diarrhea.  Likely viral gastroenteritis but given he is tender in the left lower quadrant will obtain CT imaging to evaluate for diverticulitis, perforation, colitis.  Labs show potassium of 2.6 likely from frequent vomiting.  Will replace with oral and IV potassium.  Creatinine also mildly elevated at 1.58.  Will give IV fluids, pain and nausea medicine.  ED PROGRESS: Patient reports feeling better.  CT scan shows diverticulosis without diverticulitis.  Fatty umbilical hernia has enlarged since 2017.  No acute abnormality seen.  He is drinking without difficulty.  Patient's magnesium level is normal.  His EKG does show a slightly prolonged QT interval which she has had on previous EKGs.  Will discharge home with  oral potassium replacement.  Will discharge with short course of Zofran for nausea.  Instructed that he use Imodium as needed for diarrhea.  Recommended alternating Tylenol and Motrin for fever, pain.  Recommended bland diet for the next several days.  Suspect viral gastroenteritis as the cause of his symptoms today.   At this time, I do not feel there is any life-threatening condition present. I have reviewed and discussed all results (EKG, imaging, lab, urine as appropriate) and exam findings with patient/family. I have reviewed nursing notes and appropriate previous records.  I feel the patient is safe to be discharged home without further emergent workup and can continue workup as an outpatient as needed. Discussed usual and customary return precautions. Patient/family verbalize understanding and are comfortable with this plan.  Outpatient follow-up has been provided as needed. All questions have been answered.      EKG Interpretation  Date/Time:  Tuesday February 25 2018 07:16:12 EST Ventricular Rate:  84 PR Interval:    QRS Duration: 93 QT Interval:  424 QTC Calculation: 502 R Axis:   39 Text Interpretation:  Sinus rhythm Prolonged QT interval No significant change since last tracing Confirmed by Falisa Lamora, Cyril Mourning [phone]) on 02/25/2018 7:18:40 AM         Elijha Dedman, Delice Bison, DO 02/25/18 [id number]

## 2018-04-20 IMAGING — CT CT ABD-PELV W/ CM
2 of 5 series · 10 of 46 positions shown, 11 images · IV contrast (iopamidol)
Comparison: 09/15/2015

CLINICAL DATA: Umbilical abdominal pain starting this morning.
Patient had appendectomy on 09/15/2015.

EXAM:
CT ABDOMEN AND PELVIS WITH CONTRAST
TECHNIQUE: Multidetector CT imaging of the abdomen and pelvis was performed
using the standard protocol following bolus administration of
intravenous contrast.
CONTRAST:  100mL [id number] IOPAMIDOL ([id number]) INJECTION 61%

[Series 201: routine, idose (2) · axial · 0.78mm/px · z∈[+92,+442]mm · 7 of 92 slices shown, 8 images]
[im 11/92  soft-tissue]
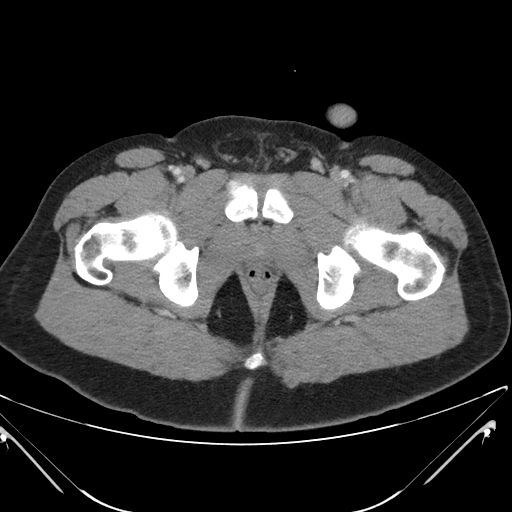
[im 11/92  bone]
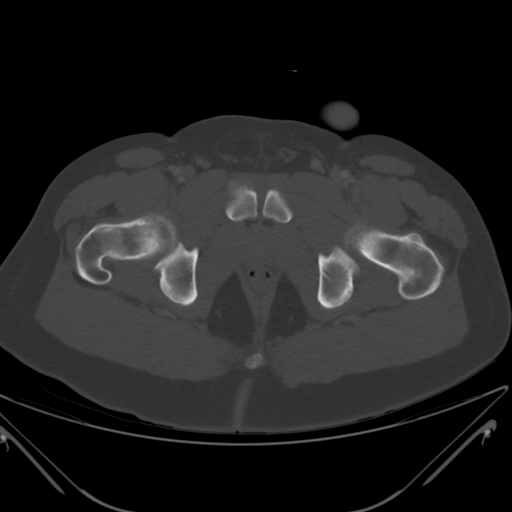
[im 22/92  soft-tissue]
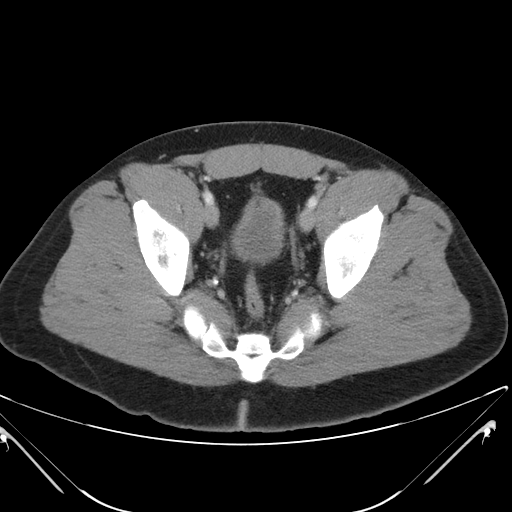
[im 33/92  soft-tissue]
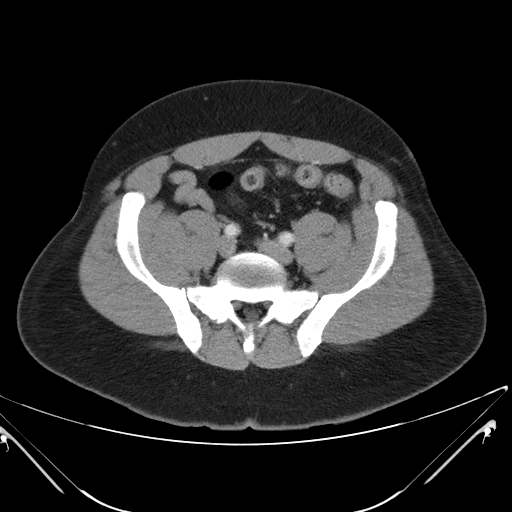
[im 49/92  soft-tissue]
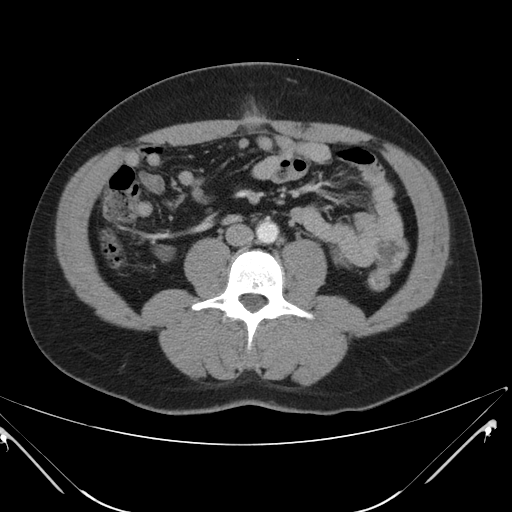
[im 59/92  soft-tissue]
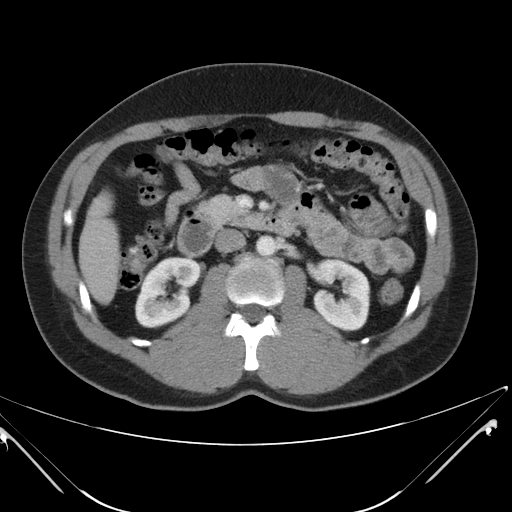
[im 70/92  soft-tissue]
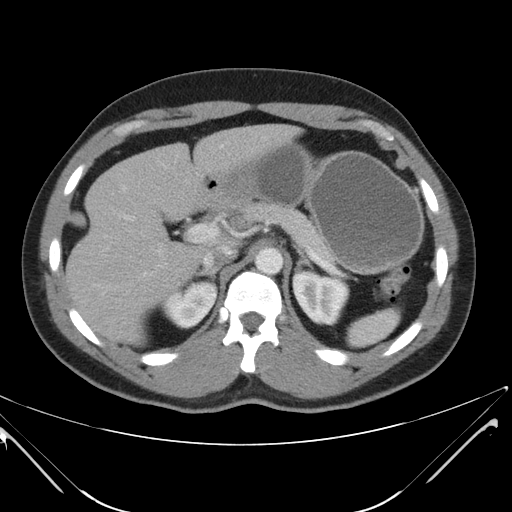
[im 81/92  soft-tissue]
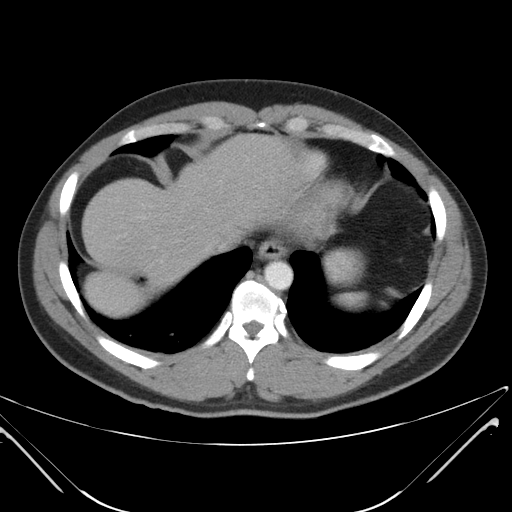

[Series 203: coronals, idose (2) · coronal · 0.45mm/px · 3 of 110 slices shown]
[im 37/110  soft-tissue]
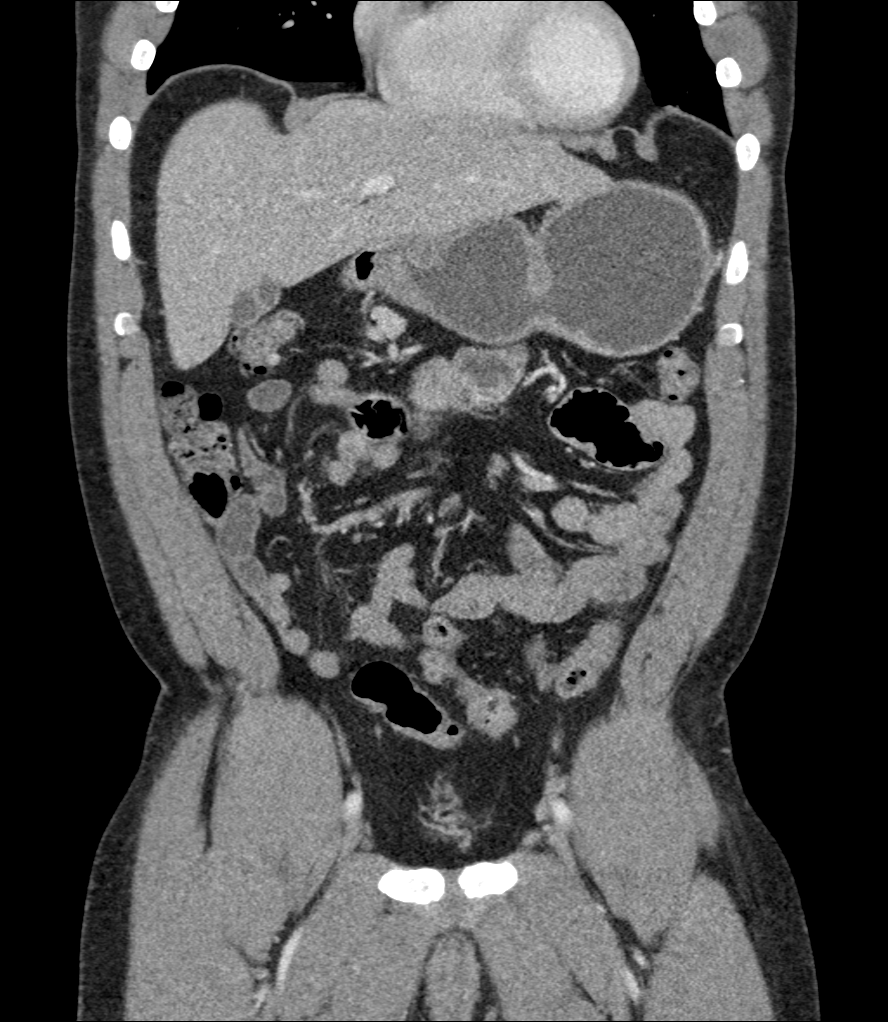
[im 49/110  soft-tissue]
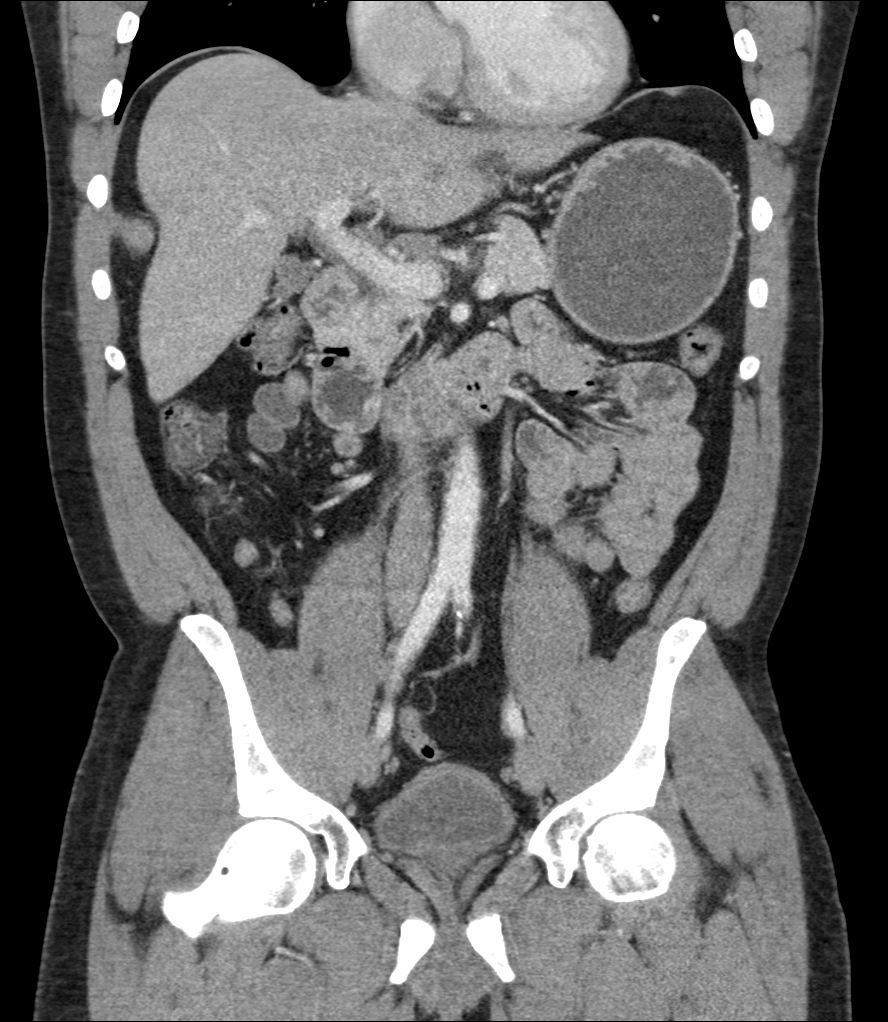
[im 61/110  soft-tissue]
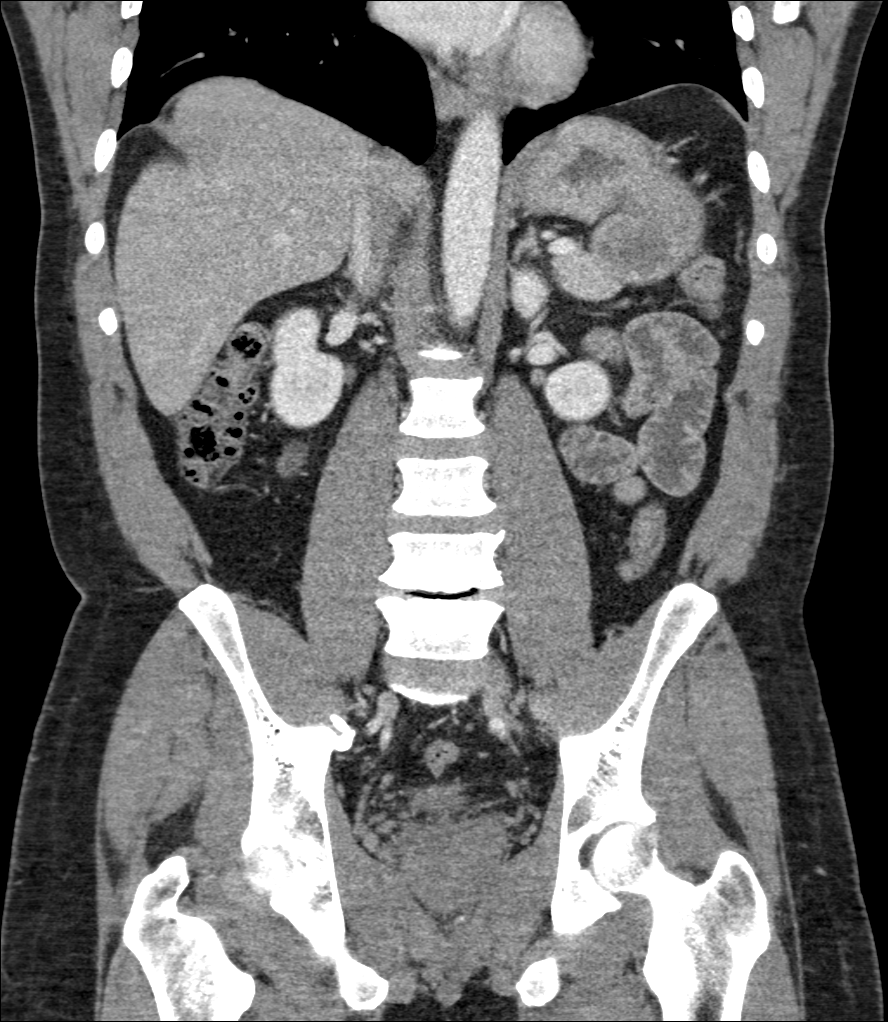

[10 of 46 positions shown; findings below may reference images not displayed]

FINDINGS: Lower chest:  Compressive atelectasis posterior lung bases.

Hepatobiliary: No focal abnormality within the liver parenchyma.
Multiple folds noted in the gallbladder, otherwise unremarkable. No
intrahepatic or extrahepatic biliary dilation.

Pancreas: No focal mass lesion. No dilatation of the main duct. No
intraparenchymal cyst. No peripancreatic edema.

Spleen: No splenomegaly. No focal mass lesion.

Adrenals/Urinary Tract: No adrenal nodule or mass. Kidneys are
unremarkable. No evidence for hydroureter. The urinary bladder
appears normal for the degree of distention.

Stomach/Bowel: Stomach is nondistended. No gastric wall thickening.
No evidence of outlet obstruction. Duodenum is normally positioned
as is the ligament of Treitz. No small bowel wall thickening. No
small bowel dilatation. The terminal ileum is normal.
Nonvisualization of the appendix is consistent with the reported
history of appendectomy. No gross colonic mass. No colonic wall
thickening. No substantial diverticular change.

Vascular/Lymphatic: There is abdominal aortic atherosclerosis
without aneurysm. There is no gastrohepatic or hepatoduodenal
ligament lymphadenopathy. No intraperitoneal or retroperitoneal
lymphadenopathy. No pelvic sidewall lymphadenopathy.

Reproductive: The prostate gland and seminal vesicles have normal
imaging features.

Other: No intraperitoneal free fluid. No evidence for
intraperitoneal abscess. No intraperitoneal free air.

Musculoskeletal: On the previous study, the patient had a small
supraumbilical midline ventral hernia about 2 cm cranial to the
umbilicus. This persists on today's study and there is some edema/
inflammation associated with the fat containing hernia sac that
measures approximately 1.6 x 1.9 x 2.3 cm. There is no bowel
contained in the hernia. There is no focal fluid collection in the
anterior abdominal wall to suggest abscess.
IMPRESSION: 1. No evidence for intraperitoneal free fluid or intraperitoneal
abscess in this patient status post recent cholecystectomy.
2. Small supraumbilical midline ventral hernia contains only fat
although there is edema/inflammation associated with the hernia sac.
No abscess or fluid within the hernia.

## 2018-04-23 ENCOUNTER — Ambulatory Visit: Payer: Self-pay | Admitting: Family Medicine

## 2018-06-02 ENCOUNTER — Other Ambulatory Visit: Payer: Self-pay

## 2018-06-02 ENCOUNTER — Encounter: Payer: Self-pay | Admitting: Family Medicine

## 2018-06-02 ENCOUNTER — Ambulatory Visit: Payer: BLUE CROSS/BLUE SHIELD | Attending: Family Medicine | Admitting: Family Medicine

## 2018-06-02 DIAGNOSIS — E876 Hypokalemia: Secondary | ICD-10-CM

## 2018-06-02 DIAGNOSIS — E7800 Pure hypercholesterolemia, unspecified: Secondary | ICD-10-CM | POA: Diagnosis not present

## 2018-06-02 DIAGNOSIS — F419 Anxiety disorder, unspecified: Secondary | ICD-10-CM

## 2018-06-02 DIAGNOSIS — G8929 Other chronic pain: Secondary | ICD-10-CM

## 2018-06-02 DIAGNOSIS — I10 Essential (primary) hypertension: Secondary | ICD-10-CM

## 2018-06-02 DIAGNOSIS — M25562 Pain in left knee: Secondary | ICD-10-CM

## 2018-06-02 DIAGNOSIS — M25561 Pain in right knee: Secondary | ICD-10-CM

## 2018-06-02 DIAGNOSIS — K219 Gastro-esophageal reflux disease without esophagitis: Secondary | ICD-10-CM

## 2018-06-02 DIAGNOSIS — J3089 Other allergic rhinitis: Secondary | ICD-10-CM

## 2018-06-02 DIAGNOSIS — M778 Other enthesopathies, not elsewhere classified: Secondary | ICD-10-CM

## 2018-06-02 DIAGNOSIS — R229 Localized swelling, mass and lump, unspecified: Secondary | ICD-10-CM

## 2018-06-02 DIAGNOSIS — M779 Enthesopathy, unspecified: Secondary | ICD-10-CM | POA: Diagnosis not present

## 2018-06-02 MED ORDER — BUSPIRONE HCL 15 MG PO TABS: 15.0000 mg | ORAL_TABLET | Freq: Two times a day (BID) | ORAL | 1 refills | Status: DC

## 2018-06-02 MED ORDER — HYDROCHLOROTHIAZIDE 25 MG PO TABS: 25.0000 mg | ORAL_TABLET | Freq: Every day | ORAL | 6 refills | Status: DC

## 2018-06-02 MED ORDER — CETIRIZINE HCL 10 MG PO TABS: 10.0000 mg | ORAL_TABLET | Freq: Every day | ORAL | 1 refills | Status: DC

## 2018-06-02 MED ORDER — MELOXICAM 15 MG PO TABS: 15.0000 mg | ORAL_TABLET | Freq: Every day | ORAL | 0 refills | Status: DC

## 2018-06-02 MED ORDER — FLUTICASONE PROPIONATE 50 MCG/ACT NA SUSP: 2.0000 | Freq: Every day | NASAL | 6 refills | Status: DC

## 2018-06-02 MED ORDER — ATORVASTATIN CALCIUM 20 MG PO TABS: 20.0000 mg | ORAL_TABLET | Freq: Every day | ORAL | 1 refills | Status: DC

## 2018-06-02 MED ORDER — PREDNISONE 20 MG PO TABS: 20.0000 mg | ORAL_TABLET | Freq: Two times a day (BID) | ORAL | 0 refills | Status: DC

## 2018-06-02 MED ORDER — OMEPRAZOLE 40 MG PO CPDR: 40.0000 mg | DELAYED_RELEASE_CAPSULE | Freq: Every day | ORAL | 0 refills | Status: DC

## 2018-06-02 NOTE — Progress Notes (Signed)
Virtual Visit via Telephone Note  I connected with Timothy Mccarthy, on 06/02/2018 at 9:09am by telephone and verified that I am speaking with the correct person using two identifiers.   Consent: I discussed the limitations, risks, security and privacy concerns of performing an evaluation and management service by telephone and the availability of in person appointments. I also discussed with the patient that there may be a patient responsible charge related to this service. The patient expressed understanding and agreed to proceed.   Location of Patient: Patient's job  Location of Provider: Clinic  Persons participating in Telemedicine visit: Lucky Trotta- CMA Dr. Halina Andreas    History of Present Illness: Timothy Mccarthy is a 48 year old male with a history of hypertension, GERD, anxiety who presents today for a follow-up visit. Today he complains of difficulty using his left hand for the last couple of weeks and he is left-handed.  Endorses pain in the palm of his hand with difficulty making a fist.  Initially noticed swelling for which he used an ice pack.  Meloxicam is on his med list however he has been out of this medication. He has also noticed a "mole" in the inferior aspect of his scrotum which has increased in size, not associated with itching and has been present for 2 months.  Denies pain.  He has been compliant with his medications and is requesting refill of potassium.  He did receive a 2-week supply 3 months ago after which he was not consistently taking it.  Last potassium was 2.6 from a previous ED lab in 02/2018 at which time he was managed for nausea and vomiting. Reflux symptoms and anxiety symptoms are controlled. Denies chest pains, pedal edema.   Past Medical History:  Diagnosis Date  . Adjustment disorder with anxious mood   . Anxiety   . Arthritis    "hands" (09/28/2015)  . Depression   . GERD (gastroesophageal reflux disease)   .  Hypertension   . Tobacco use disorder     Past Surgical History:  Procedure Laterality Date  . APPENDECTOMY  09/15/2015  . CARPAL TUNNEL RELEASE Bilateral   . INCISIONAL HERNIA REPAIR N/A 09/29/2015   Procedure: HERNIA REPAIR INCISIONAL;  Surgeon: Erroll Luna, MD;  Location: [facility];  Service: General;  Laterality: N/A;  . LAPAROSCOPIC APPENDECTOMY N/A 09/15/2015   Procedure: APPENDECTOMY LAPAROSCOPIC;  Surgeon: Judeth Horn, MD;  Location: [facility];  Service: General;  Laterality: N/A;  . LUMPS ON BACK  2013  . WISDOM TOOTH EXTRACTION      Allergies  Allergen Reactions  . Penicillins Anaphylaxis    Has patient had a PCN reaction causing immediate rash, facial/tongue/throat swelling, SOB or lightheadedness with hypotensionNO Has patient had a PCN reaction causing severe rash involving mucus membranes or skin necrosis: NO Has patient had a PCN reaction that required hospitalization NO Has patient had a PCN reaction occurring within the last 10 years: NO If all of the above answers are "NO", then may proceed with Cephalosporin use.     Observations/Objective: Alert, awake, oriented x3 Not in acute distress  CMP Latest Ref Rng & Units 02/25/2018 11/26/2017 03/27/2017  Glucose 70 - 99 mg/dL 125(H) 84 92  BUN 6 - 20 mg/dL 16 9 10   Creatinine 0.61 - 1.24 mg/dL 1.58(H) 1.22 1.22  Sodium 135 - 145 mmol/L 134(L) 141 143  Potassium 3.5 - 5.1 mmol/L 2.6(LL) 3.8 3.9  Chloride 98 - 111 mmol/L 89(L) 100 104  CO2 22 -  32 mmol/L 32 25 24  Calcium 8.9 - 10.3 mg/dL 9.8 9.1 9.7  Total Protein 6.5 - 8.1 g/dL 7.5 6.7 7.1  Total Bilirubin 0.3 - 1.2 mg/dL 0.6 0.3 <0.2  Alkaline Phos 38 - 126 U/L 84 98 103  AST 15 - 41 U/L 28 23 28   ALT 0 - 44 U/L 24 26 39    Lipid Panel     Component Value Date/Time   CHOL 104 11/26/2017 1211   TRIG 281 (H) 11/26/2017 1211   HDL 28 (L) 11/26/2017 1211   CHOLHDL 3.7 11/26/2017 1211   LDLCALC 20 11/26/2017 1211    Assessment and Plan: 1. Gastroesophageal  reflux disease, esophagitis presence not specified Stable - omeprazole (PRILOSEC) 40 MG capsule; Take 1 capsule (40 mg total) by mouth daily.  Dispense: 90 capsule; Refill: 0  2. Pure hypercholesterolemia Slightly elevated triglycerides from last set of labs Low-cholesterol diet Facial capsules were beneficial - atorvastatin (LIPITOR) 20 MG tablet; Take 1 tablet (20 mg total) by mouth daily.  Dispense: 90 tablet; Refill: 1  3. Anxiety Controlled - busPIRone (BUSPAR) 15 MG tablet; Take 1 tablet (15 mg total) by mouth 2 (two) times daily.  Dispense: 180 tablet; Refill: 1  4. Seasonal allergic rhinitis due to other allergic trigger Stable - cetirizine (ZYRTEC) 10 MG tablet; Take 1 tablet (10 mg total) by mouth daily.  Dispense: 90 tablet; Refill: 1 - fluticasone (FLONASE) 50 MCG/ACT nasal spray; Place 2 sprays into both nostrils daily.  Dispense: 16 g; Refill: 6  5. Essential hypertension Advised to keep blood pressure logs at home Counseled on blood pressure goal of less than 130/80, low-sodium, DASH diet, medication compliance, 150 minutes of moderate intensity exercise per week. Discussed medication compliance, adverse effects. - hydrochlorothiazide (HYDRODIURIL) 25 MG tablet; Take 1 tablet (25 mg total) by mouth daily.  Dispense: 30 tablet; Refill: 6  6. Chronic pain of both knees Stable - meloxicam (MOBIC) 15 MG tablet; Take 1 tablet (15 mg total) by mouth daily.  Dispense: 90 tablet; Refill: 0  7. Tendinitis of hand We will treat with prednisone first and once course is completed, he is advised to switch to meloxicam Reassess at next visit - predniSONE (DELTASONE) 20 MG tablet; Take 1 tablet (20 mg total) by mouth 2 (two) times daily with a meal.  Dispense: 10 tablet; Refill: 0  8. Subcutaneous mass Scrotal lesion needs examination He will come in for an in person visit next week  9. Hypokalemia Was on potassium replacement for 2 weeks in January after that has been without  potassium for the last 3 months We will check levels at next visit and order potassium if needed   Follow Up Instructions: Return in about 1 week (around 06/09/2018) for Follow-up of hand tendinitis and mole.    I discussed the assessment and treatment plan with the patient. The patient was provided an opportunity to ask questions and all were answered. The patient agreed with the plan and demonstrated an understanding of the instructions.   The patient was advised to call back or seek an in-person evaluation if the symptoms worsen or if the condition fails to improve as anticipated.     I provided 25 minutes total of non-face-to-face time during this encounter including median intraservice time, reviewing previous notes, labs, imaging, medications and explaining diagnosis and management.     Charlott Rakes, MD, FAAFP. [facility] and [place], [place]   06/02/2018, 9:10 AM

## 2018-06-02 NOTE — Progress Notes (Signed)
Patient has been called and DOB has been verified. Patient has been screened and transferred to PCP to start phone visit.  C/C:  Refills: Flonase,meloxicam, nicotine gum, potassium.

## 2018-06-09 ENCOUNTER — Encounter: Payer: Self-pay | Admitting: Family Medicine

## 2018-06-09 ENCOUNTER — Ambulatory Visit: Payer: BLUE CROSS/BLUE SHIELD | Attending: Family Medicine | Admitting: Family Medicine

## 2018-06-09 ENCOUNTER — Other Ambulatory Visit: Payer: Self-pay

## 2018-06-09 VITALS — BP 138/86 | HR 83 | Temp 98.0°F | Wt 217.8 lb

## 2018-06-09 DIAGNOSIS — M779 Enthesopathy, unspecified: Secondary | ICD-10-CM

## 2018-06-09 DIAGNOSIS — A630 Anogenital (venereal) warts: Secondary | ICD-10-CM

## 2018-06-09 NOTE — Patient Instructions (Signed)
Genital Warts  Genital warts are small growths in the area around the genitals or the anus. They are caused by a type of germ (HPV virus). This germ is spread from person to person during sex. It can be spread through vaginal, anal, and oral sex. Genital warts can lead to other problems if they are not treated.  A person is more likely to have this condition if he or she:   Has sex without using a condom.   Has sex with many people.   Has sex before the age of 16.   Has a weak body defense (immune) system.  This condition can be treated with medicines. Your doctor may also burn or freeze the warts. In some cases, surgery may be done to remove the warts.  Follow these instructions at home:  Medicines     Apply over-the-counter and prescription medicines only as told by your doctor.   Do not use medicines that are meant for treating hand warts.   Talk with your doctor about using creams to treat itching.  Instructions for women   Plan to have regular tests to check for cervical cancer. Your risk for this cancer increases when you have genital warts.   If you become pregnant, tell your doctor that you have had genital warts. The germ can be passed to the baby.  General instructions   Do not touch or scratch the warts.   Do not have sex until your treatment is done.   Tell your current and past sexual partners about your condition. They may need treatment.   After treatment, use condoms during sex.   Keep all follow-up visits as told by your doctor. This is important.  How is this prevented?  Talk with your doctor about getting the HPV shot. The HPV shot:   Can help stop some HPV infections and cancers.   Is given to males and females who are 11-26 years old.   Will not work if you already have HPV.   Is not recommended for pregnant women.  Contact a doctor if:   You have redness, swelling, or pain in the area of the treated skin.   You have a fever.   You feel sick.   You feel lumps in the area  around your genitals or anus.   You have bleeding in the area around your genitals or anus.   You have pain during sex.  Summary   Genital warts are small growths in the areas around the genitals or the anus. They are caused by a type of germ (HPV virus).   The germ is spread by having vaginal, anal, or oral sex without using a condom.   This condition is treated using medicines. In some cases, freezing, burning, or surgery may be done to get rid of the warts.   This condition may be prevented by getting a HPV shot.  This information is not intended to replace advice given to you by your health care provider. Make sure you discuss any questions you have with your health care provider.  Document Released: 04/25/2009 Document Revised: 03/05/2017 Document Reviewed: 03/05/2017  [employer]vier Interactive Patient Education  2019 [employer]

## 2018-06-09 NOTE — Progress Notes (Signed)
Subjective:  Patient ID: Timothy Mccarthy, male    DOB: 08-Mar-1970  Age: 48 y.o. MRN: [id number]  CC: Rash  HPI Timothy Mccarthy Timothy Mccarthy is a 48 year old male with a history of hypertension, GERD, anxiety who presents today for a follow-up visit.  At a telephone visit last week he had complained of pain in his left hand difficulty making a fist. 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 He received prednisone at his last visit with improvement in symptoms. He is also concerned about a mole in the inferior aspect of his scrotum and is wondering if he contracted this from his girlfriend.  Lesion is not itchy or painful.  Past Medical History:  Diagnosis Date  . Adjustment disorder with anxious mood   . Anxiety   . Arthritis    "hands" (09/28/2015)  . Depression   . GERD (gastroesophageal reflux disease)   . Hypertension   . Tobacco use disorder     Past Surgical History:  Procedure Laterality Date  . APPENDECTOMY  09/15/2015  . CARPAL TUNNEL RELEASE Bilateral   . INCISIONAL HERNIA REPAIR N/A 09/29/2015   Procedure: HERNIA REPAIR INCISIONAL;  Surgeon: Erroll Luna, MD;  Location: [facility];  Service: General;  Laterality: N/A;  . LAPAROSCOPIC APPENDECTOMY N/A 09/15/2015   Procedure: APPENDECTOMY LAPAROSCOPIC;  Surgeon: Judeth Horn, MD;  Location: [facility];  Service: General;  Laterality: N/A;  . LUMPS ON BACK  2013  . WISDOM TOOTH EXTRACTION      Family History  Problem Relation Age of Onset  . Stroke Mother   . Diabetes type II Mother     Allergies  Allergen Reactions  . Penicillins Anaphylaxis    Has patient had a PCN reaction causing immediate rash, facial/tongue/throat swelling, SOB or lightheadedness with hypotensionNO Has patient had a PCN reaction causing severe rash  involving mucus membranes or skin necrosis: NO Has patient had a PCN reaction that required hospitalization NO Has patient had a PCN reaction occurring within the last 10 years: NO If all of the above answers are "NO", then may proceed with Cephalosporin use.    Outpatient Medications Prior to Visit  Medication Sig Dispense Refill  . atorvastatin (LIPITOR) 20 MG tablet Take 1 tablet (20 mg total) by mouth daily. 90 tablet 1  . busPIRone (BUSPAR) 15 MG tablet Take 1 tablet (15 mg total) by mouth 2 (two) times daily. 180 tablet 1  . calcium carbonate (TUMS - DOSED IN MG ELEMENTAL CALCIUM) 500 MG chewable tablet Chew 2 tablets by mouth daily.    . cetirizine (ZYRTEC) 10 MG tablet Take 1 tablet (10 mg total) by mouth daily. 90 tablet 1  . fluticasone (FLONASE) 50 MCG/ACT nasal spray Place 2 sprays into both nostrils daily. 16 g 6  . gabapentin (NEURONTIN) 300 MG capsule Take 1 capsule (300 mg total) by mouth at bedtime. 30 capsule 6  . hydrochlorothiazide (HYDRODIURIL) 25 MG tablet Take 1 tablet (25 mg total) by mouth daily. 30 tablet 6  . ibuprofen (ADVIL,MOTRIN) 600 MG tablet Take 1 tablet (600 mg total) by mouth every 8 (eight) hours as needed for moderate pain. 30 tablet 0  . meloxicam (MOBIC) 15 MG tablet Take 1 tablet (15 mg total) by mouth daily. [place]  tablet 0  . Multiple Vitamin (ONE-A-DAY MENS PO) Take 2 tablets by mouth daily.    Marland Kitchen omeprazole (PRILOSEC) 40 MG capsule Take 1 capsule (40 mg total) by mouth daily. 90 capsule 0  . ondansetron (ZOFRAN ODT) 4 MG disintegrating tablet Take 1 tablet (4 mg total) by mouth every 6 (six) hours as needed. 20 tablet 0  . potassium chloride SA (K-DUR,KLOR-CON) 20 MEQ tablet Take 1 tablet (20 mEq total) by mouth 2 (two) times daily. 14 tablet 0  . predniSONE (DELTASONE) 20 MG tablet Take 1 tablet (20 mg total) by mouth 2 (two) times daily with a meal. 10 tablet 0  . nicotine (NICODERM CQ - DOSED IN MG/24 HOURS) 14 mg/24hr patch Place 1 patch (14 mg total)  daily onto the skin. (Patient not taking: Reported on 06/02/2018) 30 patch 2  . nicotine polacrilex (NICORETTE) 4 MG gum Take 1 each (4 mg total) by mouth as needed for smoking cessation. (Patient not taking: Reported on 06/02/2018) 100 tablet 0   No facility-administered medications prior to visit.      ROS Review of Systems  Constitutional: Negative for activity change and appetite change.  HENT: Negative for sinus pressure and sore throat.   Eyes: Negative for visual disturbance.  Respiratory: Negative for cough, chest tightness and shortness of breath.   Cardiovascular: Negative for chest pain and leg swelling.  Gastrointestinal: Negative for abdominal distention, abdominal pain, constipation and diarrhea.  Endocrine: Negative.   Genitourinary: Negative for dysuria.  Musculoskeletal:       See HPI  Skin: Positive for rash.  Allergic/Immunologic: Negative.   Neurological: Negative for weakness, light-headedness and numbness.  Psychiatric/Behavioral: Negative for dysphoric mood and suicidal ideas.    Objective:  BP 138/86   Pulse 83   Temp 98 F (36.7 C) (Oral)   Wt 217 lb 12.8 oz (98.8 kg)   SpO2 97%   BMI 34.11 kg/m   BP/Weight 06/09/2018 9/32/3557 04/14/2023  Systolic BP 427 062 376  Diastolic BP 86 81 88  Wt. (Lbs) 217.8 210 213.8  BMI 34.11 32.89 33.49      Physical Exam Constitutional:      Appearance: He is well-developed.  Cardiovascular:     Rate and Rhythm: Normal rate.     Heart sounds: Normal heart sounds. No murmur.  Pulmonary:     Effort: Pulmonary effort is normal.     Breath sounds: Normal breath sounds. No wheezing or rales.  Chest:     Chest wall: No tenderness.  Abdominal:     General: Bowel sounds are normal. There is no distension.     Palpations: Abdomen is soft. There is no mass.     Tenderness: There is no abdominal tenderness.  Genitourinary:    Comments: Genital wart on inferior aspect of scrotum Musculoskeletal: Normal range of  motion.        General: Tenderness (TTP of extensor and flexor tendons on left 4th finger. and on hyperextension as well. No edema or erythema) present.  Neurological:     Mental Status: He is alert and oriented to person, place, and time.     CMP Latest Ref Rng & Units 02/25/2018 11/26/2017 03/27/2017  Glucose 70 - 99 mg/dL 125(H) 84 92  BUN 6 - 20 mg/dL 16 9 10   Creatinine 0.61 - 1.24 mg/dL 1.58(H) 1.22 1.22  Sodium 135 - 145 mmol/L 134(L) 141 143  Potassium 3.5 - 5.1 mmol/L 2.6(LL) 3.8 3.9  Chloride 98 - 111 mmol/L 89(L)  100 104  CO2 22 - 32 mmol/L 32 25 24  Calcium 8.9 - 10.3 mg/dL 9.8 9.1 9.7  Total Protein 6.5 - 8.1 g/dL 7.5 6.7 7.1  Total Bilirubin 0.3 - 1.2 mg/dL 0.6 0.3 <0.2  Alkaline Phos 38 - 126 U/L 84 98 103  AST 15 - 41 U/L 28 23 28   ALT 0 - 44 U/L 24 26 39    Lipid Panel     Component Value Date/Time   CHOL 104 11/26/2017 1211   TRIG 281 (H) 11/26/2017 1211   HDL 28 (L) 11/26/2017 1211   CHOLHDL 3.7 11/26/2017 1211   LDLCALC 20 11/26/2017 1211    CBC    Component Value Date/Time   WBC 11.7 (H) 02/25/2018 0057   RBC 5.52 02/25/2018 0057   HGB 15.3 02/25/2018 0057   HGB 14.8 10/25/2017 1115   HCT 46.3 02/25/2018 0057   HCT 46.0 10/25/2017 1115   PLT 306 02/25/2018 0057   PLT 259 10/25/2017 1115   MCV 83.9 02/25/2018 0057   MCV 84 10/25/2017 1115   MCH 27.7 02/25/2018 0057   MCHC 33.0 02/25/2018 0057   RDW 13.7 02/25/2018 0057   RDW 13.3 10/25/2017 1115   LYMPHSABS 2.4 09/28/2015 1255   MONOABS 0.4 09/28/2015 1255   EOSABS 0.1 09/28/2015 1255   BASOSABS 0.0 09/28/2015 1255    Lab Results  Component Value Date   HGBA1C 6.0 (H) 03/27/2017    Assessment & Plan:   1. Genital warts He is requesting excision - Ambulatory referral to General Surgery  2. Tendinitis Improved with prednisone Continue meloxicam Advised that imaging is not indicated at this time   No orders of the defined types were placed in this encounter.   Follow-up:  Return in about 3 months (around 09/08/2018) for Follow-up of chronic medical conditions.       Charlott Rakes, MD, FAAFP. [facility] [place], [place]   06/09/2018, 11:16 AM

## 2018-08-08 ENCOUNTER — Telehealth: Payer: Self-pay | Admitting: Family Medicine

## 2018-08-08 DIAGNOSIS — M778 Other enthesopathies, not elsewhere classified: Secondary | ICD-10-CM

## 2018-08-08 MED ORDER — PREDNISONE 20 MG PO TABS: 20.0000 mg | ORAL_TABLET | Freq: Two times a day (BID) | ORAL | 0 refills | Status: DC

## 2018-08-08 NOTE — Telephone Encounter (Signed)
Will route to PCP for review. 

## 2018-08-08 NOTE — Telephone Encounter (Signed)
Patient called to request medication refill for predniSONE (DELTASONE) 20 MG tablet  Stating that his Tendinitis of hand has not improved. Patient states that PCP advice him to use an OTC medication (CAMPHO) and it is not working.  Patient uses [facility] [address]  Please advice [phone]  Thank you Timothy Mccarthy

## 2018-08-08 NOTE — Telephone Encounter (Signed)
I have refilled Prednisone and referred to Ortho.

## 2018-08-08 NOTE — Telephone Encounter (Signed)
Patient was called and informed of medication being sent to pharmacy and referral being placed.

## 2018-08-13 ENCOUNTER — Ambulatory Visit: Payer: BLUE CROSS/BLUE SHIELD | Admitting: Orthopaedic Surgery

## 2018-08-14 ENCOUNTER — Telehealth: Payer: Self-pay | Admitting: *Deleted

## 2018-08-14 ENCOUNTER — Other Ambulatory Visit: Payer: Self-pay

## 2018-08-14 ENCOUNTER — Telehealth: Payer: Self-pay | Admitting: Family Medicine

## 2018-08-14 DIAGNOSIS — Z20822 Contact with and (suspected) exposure to covid-19: Secondary | ICD-10-CM

## 2018-08-14 NOTE — Telephone Encounter (Signed)
Patient needs to be scheduled for COVID 19 testing. NO [facility] RN available upon callback.

## 2018-08-14 NOTE — Telephone Encounter (Signed)
Pt scheduled for covid testing today @ 2:15 @ [facility]. Instructions given and order placed

## 2018-08-14 NOTE — Telephone Encounter (Signed)
-----   Message from Carilyn Goodpasture, RN sent at 08/14/2018 12:15 PM EDT ----- Regarding: exposure at work to confirmed COVID-19. Primary Coverage  Payer Plan Sponsor Code Group Number Group Name [place]  [id number]  Primary [place] Name Subscriber [facility] Subscriber Address [id number] [facility] [id number]    [place], [place] [address]

## 2018-08-14 NOTE — Telephone Encounter (Signed)
LM on VM to return call to schedule covid testing @ [phone] M-F [id number]-[id number]. Order placed

## 2018-08-14 NOTE — Telephone Encounter (Signed)
Sent order to [facility].  Informed patient to await call for appointment for testing at [facility]. Also patient was informed to self quarantine until he get a negative result.   Pt verbalized understanding.

## 2018-08-14 NOTE — Telephone Encounter (Signed)
Thank you for addressing this Timothy Mccarthy.

## 2018-08-14 NOTE — Telephone Encounter (Signed)
Patient called stating that he was told to get tested for COVID by his employer due to 2 coworkers testing positive. Please follow up.

## 2018-08-15 DIAGNOSIS — Z1159 Encounter for screening for other viral diseases: Secondary | ICD-10-CM | POA: Diagnosis not present

## 2018-08-15 DIAGNOSIS — R5383 Other fatigue: Secondary | ICD-10-CM | POA: Diagnosis not present

## 2018-08-17 ENCOUNTER — Other Ambulatory Visit: Payer: Self-pay

## 2018-08-17 ENCOUNTER — Emergency Department (HOSPITAL_COMMUNITY): Payer: BC Managed Care – PPO

## 2018-08-17 ENCOUNTER — Emergency Department (HOSPITAL_COMMUNITY)
Admission: EM | Admit: 2018-08-17 | Discharge: 2018-08-18 | Disposition: A | Discharge status: Home / Self Care | Payer: BC Managed Care – PPO | Attending: Emergency Medicine | Admitting: Emergency Medicine

## 2018-08-17 ENCOUNTER — Encounter (HOSPITAL_COMMUNITY): Payer: Self-pay | Admitting: Emergency Medicine

## 2018-08-17 DIAGNOSIS — R05 Cough: Secondary | ICD-10-CM | POA: Insufficient documentation

## 2018-08-17 DIAGNOSIS — Z20828 Contact with and (suspected) exposure to other viral communicable diseases: Secondary | ICD-10-CM | POA: Diagnosis not present

## 2018-08-17 DIAGNOSIS — Z79899 Other long term (current) drug therapy: Secondary | ICD-10-CM | POA: Insufficient documentation

## 2018-08-17 DIAGNOSIS — R059 Cough, unspecified: Secondary | ICD-10-CM

## 2018-08-17 DIAGNOSIS — I10 Essential (primary) hypertension: Secondary | ICD-10-CM | POA: Insufficient documentation

## 2018-08-17 DIAGNOSIS — F17210 Nicotine dependence, cigarettes, uncomplicated: Secondary | ICD-10-CM | POA: Insufficient documentation

## 2018-08-17 DIAGNOSIS — R112 Nausea with vomiting, unspecified: Secondary | ICD-10-CM | POA: Insufficient documentation

## 2018-08-17 DIAGNOSIS — R079 Chest pain, unspecified: Secondary | ICD-10-CM | POA: Insufficient documentation

## 2018-08-17 LAB — TROPONIN I (HIGH SENSITIVITY): Troponin I (High Sensitivity): 5 ng/L (ref <18)

## 2018-08-17 LAB — CBC
HCT: 46.7 % (ref 39.0–52.0)
Hemoglobin: 15 g/dL (ref 13.0–17.0)
MCH: 27.7 pg (ref 26.0–34.0)
MCHC: 32.1 g/dL (ref 30.0–36.0)
MCV: 86.2 fL (ref 80.0–100.0)
Platelets: 272 10*3/uL (ref 150–400)
RBC: 5.42 MIL/uL (ref 4.22–5.81)
RDW: 14.4 % (ref 11.5–15.5)
WBC: 14.6 10*3/uL — ABNORMAL HIGH (ref 4.0–10.5)
nRBC: 0 % (ref 0.0–0.2)

## 2018-08-17 LAB — BASIC METABOLIC PANEL
Anion gap: 10 (ref 5–15)
BUN: 12 mg/dL (ref 6–20)
CO2: 29 mmol/L (ref 22–32)
Calcium: 9.5 mg/dL (ref 8.9–10.3)
Chloride: 102 mmol/L (ref 98–111)
Creatinine, Ser: 1.39 mg/dL — ABNORMAL HIGH (ref 0.61–1.24)
GFR calc Af Amer: >60 mL/min (ref >60)
GFR calc non Af Amer: 60 mL/min — ABNORMAL LOW (ref >60)
Glucose, Bld: 118 mg/dL — ABNORMAL HIGH (ref 70–99)
Potassium: 3.9 mmol/L (ref 3.5–5.1)
Sodium: 141 mmol/L (ref 135–145)

## 2018-08-17 MED ORDER — SODIUM CHLORIDE 0.9% FLUSH: 3.0000 mL | Freq: Once | INTRAVENOUS | Status: DC

## 2018-08-17 NOTE — ED Triage Notes (Addendum)
Pt c/o substernal CP x 2 days, squeezing, non radiating, +shob, +cough, +fatigue, +HA, +emesis, R ear congestion Pt had COVID test on Friday, unkn result, several members at job COVID+

## 2018-08-18 MED ORDER — ALBUTEROL SULFATE HFA 108 (90 BASE) MCG/ACT IN AERS
2.0000 | INHALATION_SPRAY | Freq: Once | RESPIRATORY_TRACT | Status: AC
  Administered 2018-08-18: 03:00:00 2 via RESPIRATORY_TRACT
  Filled 2018-08-18: qty 6.7

## 2018-08-18 MED ORDER — ACETAMINOPHEN 500 MG PO TABS
1000.0000 mg | ORAL_TABLET | Freq: Once | ORAL | Status: AC
  Administered 2018-08-18: 03:00:00 1000 mg via ORAL
  Filled 2018-08-18: qty 2

## 2018-08-18 MED ORDER — ONDANSETRON 4 MG PO TBDP
4.0000 mg | ORAL_TABLET | Freq: Once | ORAL | Status: AC
  Administered 2018-08-18: 4 mg via ORAL
  Filled 2018-08-18: qty 1

## 2018-08-18 MED ORDER — ONDANSETRON 4 MG PO TBDP: 4.0000 mg | ORAL_TABLET | Freq: Three times a day (TID) | ORAL | 0 refills | Status: DC | PRN

## 2018-08-18 NOTE — Discharge Instructions (Addendum)
As we discussed, I do have concern that you have COVID-19.  Hopefully your test will come back in the next few days.  Until that time you need to continue to quarantine at home as you have been doing.  If test is positive, you will likely need to quarantine for an additional 2 weeks.  See CDC guidelines below. Recommend Tylenol for fever, rest, lots of fluids. Can use Zofran for nausea.  Can use albuterol when needed-- 2 puffs every 4 hours or so. Follow-up with your primary care doctor. Return here for any new or worsening symptoms, specifically labored breathing, uncontrolled fever, etc.     Person Under Monitoring Name: Timothy Mccarthy  Location: [address] [place] [address]   Infection Prevention Recommendations for Individuals Confirmed to have, or Being Evaluated for, 2019 Novel Coronavirus (COVID-19) Infection Who Receive Care at Home  Individuals who are confirmed to have, or are being evaluated for, COVID-19 should follow the prevention steps below until a healthcare provider or local or state health department says they can return to normal activities.  Stay home except to get medical care You should restrict activities outside your home, except for getting medical care. Do not go to work, school, or public areas, and do not use public transportation or taxis.  Call ahead before visiting your doctor Before your medical appointment, call the healthcare provider and tell them that you have, or are being evaluated for, COVID-19 infection. This will help the healthcare providers office take steps to keep other people from getting infected. Ask your healthcare provider to call the local or state health department.  Monitor your symptoms Seek prompt medical attention if your illness is worsening (e.g., difficulty breathing). Before going to your medical appointment, call the healthcare provider and tell them that you have, or are being evaluated for, COVID-19 infection.  Ask your healthcare provider to call the local or state health department.  Wear a facemask You should wear a facemask that covers your nose and mouth when you are in the same room with other people and when you visit a healthcare provider. People who live with or visit you should also wear a facemask while they are in the same room with you.  Separate yourself from other people in your home As much as possible, you should stay in a different room from other people in your home. Also, you should use a separate bathroom, if available.  Avoid sharing household items You should not share dishes, drinking glasses, cups, eating utensils, towels, bedding, or other items with other people in your home. After using these items, you should wash them thoroughly with soap and water.  Cover your coughs and sneezes Cover your mouth and nose with a tissue when you cough or sneeze, or you can cough or sneeze into your sleeve. Throw used tissues in a lined trash can, and immediately wash your hands with soap and water for at least 20 seconds or use an alcohol-based hand rub.  Wash your [facility] your hands often and thoroughly with soap and water for at least 20 seconds. You can use an alcohol-based hand sanitizer if soap and water are not available and if your hands are not visibly dirty. Avoid touching your eyes, nose, and mouth with unwashed hands.   Prevention Steps for Caregivers and Household Members of Individuals Confirmed to have, or Being Evaluated for, COVID-19 Infection Being Cared for in the Home  If you live with, or provide care at  home for, a person confirmed to have, or being evaluated for, COVID-19 infection please follow these guidelines to prevent infection:  Follow healthcare providers instructions Make sure that you understand and can help the patient follow any healthcare provider instructions for all care.  Provide for the patients basic needs You should help the  patient with basic needs in the home and provide support for getting groceries, prescriptions, and other personal needs.  Monitor the patients symptoms If they are getting sicker, call his or her medical provider and tell them that the patient has, or is being evaluated for, COVID-19 infection. This will help the healthcare providers office take steps to keep other people from getting infected. Ask the healthcare provider to call the local or state health department.  Limit the number of people who have contact with the patient If possible, have only one caregiver for the patient. Other household members should stay in another home or place of residence. If this is not possible, they should stay in another room, or be separated from the patient as much as possible. Use a separate bathroom, if available. Restrict visitors who do not have an essential need to be in the home.  Keep older adults, very young children, and other sick people away from the patient Keep older adults, very young children, and those who have compromised immune systems or chronic health conditions away from the patient. This includes people with chronic heart, lung, or kidney conditions, diabetes, and cancer.  Ensure good ventilation Make sure that shared spaces in the home have good air flow, such as from an air conditioner or an opened window, weather permitting.  Wash your hands often Wash your hands often and thoroughly with soap and water for at least 20 seconds. You can use an alcohol based hand sanitizer if soap and water are not available and if your hands are not visibly dirty. Avoid touching your eyes, nose, and mouth with unwashed hands. Use disposable paper towels to dry your hands. If not available, use dedicated cloth towels and replace them when they become wet.  Wear a facemask and gloves Wear a disposable facemask at all times in the room and gloves when you touch or have contact with the patients  blood, body fluids, and/or secretions or excretions, such as sweat, saliva, sputum, nasal mucus, vomit, urine, or feces.  Ensure the mask fits over your nose and mouth tightly, and do not touch it during use. Throw out disposable facemasks and gloves after using them. Do not reuse. Wash your hands immediately after removing your facemask and gloves. If your personal clothing becomes contaminated, carefully remove clothing and launder. Wash your hands after handling contaminated clothing. Place all used disposable facemasks, gloves, and other waste in a lined container before disposing them with other household waste. Remove gloves and wash your hands immediately after handling these items.  Do not share dishes, glasses, or other household items with the patient Avoid sharing household items. You should not share dishes, drinking glasses, cups, eating utensils, towels, bedding, or other items with a patient who is confirmed to have, or being evaluated for, COVID-19 infection. After the person uses these items, you should wash them thoroughly with soap and water.  Wash laundry thoroughly Immediately remove and wash clothes or bedding that have blood, body fluids, and/or secretions or excretions, such as sweat, saliva, sputum, nasal mucus, vomit, urine, or feces, on them. Wear gloves when handling laundry from the patient. Read and follow directions on labels  of laundry or clothing items and detergent. In general, wash and dry with the warmest temperatures recommended on the label.  Clean all areas the individual has used often Clean all touchable surfaces, such as counters, tabletops, doorknobs, bathroom fixtures, toilets, phones, keyboards, tablets, and bedside tables, every day. Also, clean any surfaces that may have blood, body fluids, and/or secretions or excretions on them. Wear gloves when cleaning surfaces the patient has come in contact with. Use a diluted bleach solution (e.g., dilute  bleach with 1 part bleach and 10 parts water) or a household disinfectant with a label that says EPA-registered for coronaviruses. To make a bleach solution at home, add 1 tablespoon of bleach to 1 quart (4 cups) of water. For a larger supply, add  cup of bleach to 1 gallon (16 cups) of water. Read labels of cleaning products and follow recommendations provided on product labels. Labels contain instructions for safe and effective use of the cleaning product including precautions you should take when applying the product, such as wearing gloves or eye protection and making sure you have good ventilation during use of the product. Remove gloves and wash hands immediately after cleaning.  Monitor yourself for signs and symptoms of illness Caregivers and household members are considered close contacts, should monitor their health, and will be asked to limit movement outside of the home to the extent possible. Follow the monitoring steps for close contacts listed on the symptom monitoring form.   ? If you have additional questions, contact your local health department or call the epidemiologist on call at [phone] (available 24/7). ? This guidance is subject to change. For the most up-to-date guidance from [facility], please refer to their website: [email or web address]

## 2018-08-18 NOTE — ED Provider Notes (Signed)
[facility] EMERGENCY DEPARTMENT Provider Note   CSN: [id number] Arrival date & time: 08/17/18  2223     History   Chief Complaint Chief Complaint  Patient presents with  . Chest Pain    HPI Timothy Mccarthy is a 48 y.o. male.     The history is provided by the patient and medical records.     48 year old male with history of adjustment disorder, anxiety, arthritis, depression, GERD, hypertension, presenting to the ED due to "feeling poorly".  He states "I feel like I have the flu but worse".  Patient reports about a week ago he had COVID-19 exposures at work by 2 other sick employees.  He was sent home to quarantine and had outpatient COVID test done on Thursday, 08/14/2018.  States since then he has been quarantining at home but feels worse.  States mostly he feels fatigued with no energy, poor appetite, lack of smell/taste, dry cough, nausea, and vomiting.  States he is mostly been sleeping and trying to hydrate, is able to hold fluids down.  He has remained quarantine from the remainder of his family, they have all been tested as well but are not currently displaying any symptoms.  He denies any current chest pain or shortness of breath, states this mostly occurs when he tries to get up and move around a lot.  He has not had any dizziness or syncopal events.  He has not been taking any medications for his symptoms, states he is just trying to "sleep it off".  Patient has not yet gotten the results from his Ginger Blue test, states he was told it may not come back until later this week due to holiday weekend.  Past Medical History:  Diagnosis Date  . Adjustment disorder with anxious mood   . Anxiety   . Arthritis    "hands" (09/28/2015)  . Depression   . GERD (gastroesophageal reflux disease)   . Hypertension   . Tobacco use disorder     Patient Active Problem List   Diagnosis Date Noted  . Chronic pain of both knees 11/26/2017  . Hyperlipidemia 12/25/2016  . Anxiety  06/06/2016  . Essential hypertension 11/04/2015  . Abdominal hernia 09/28/2015  . Appendicitis 09/15/2015  . Tobacco use disorder 04/21/2015  . Adjustment disorder with anxious mood 04/21/2015  . Health care maintenance 04/21/2015  . GERD (gastroesophageal reflux disease) 04/21/2015    Past Surgical History:  Procedure Laterality Date  . APPENDECTOMY  09/15/2015  . CARPAL TUNNEL RELEASE Bilateral   . INCISIONAL HERNIA REPAIR N/A 09/29/2015   Procedure: HERNIA REPAIR INCISIONAL;  Surgeon: Erroll Luna, MD;  Location: [facility];  Service: General;  Laterality: N/A;  . LAPAROSCOPIC APPENDECTOMY N/A 09/15/2015   Procedure: APPENDECTOMY LAPAROSCOPIC;  Surgeon: Judeth Horn, MD;  Location: [facility];  Service: General;  Laterality: N/A;  . LUMPS ON BACK  2013  . WISDOM TOOTH EXTRACTION          Home Medications    Prior to Admission medications   Medication Sig Start Date End Date Taking? Authorizing Provider  atorvastatin (LIPITOR) 20 MG tablet Take 1 tablet (20 mg total) by mouth daily. 06/02/18   Charlott Rakes, MD  busPIRone (BUSPAR) 15 MG tablet Take 1 tablet (15 mg total) by mouth 2 (two) times daily. 06/02/18   Charlott Rakes, MD  calcium carbonate (TUMS - DOSED IN MG ELEMENTAL CALCIUM) 500 MG chewable tablet Chew 2 tablets by mouth daily.    [provider]  cetirizine (ZYRTEC) 10 MG tablet Take 1 tablet (10 mg total) by mouth daily. 06/02/18   Charlott Rakes, MD  fluticasone (FLONASE) 50 MCG/ACT nasal spray Place 2 sprays into both nostrils daily. 06/02/18   Charlott Rakes, MD  gabapentin (NEURONTIN) 300 MG capsule Take 1 capsule (300 mg total) by mouth at bedtime. 11/26/17   Charlott Rakes, MD  hydrochlorothiazide (HYDRODIURIL) 25 MG tablet Take 1 tablet (25 mg total) by mouth daily. 06/02/18   Charlott Rakes, MD  ibuprofen (ADVIL,MOTRIN) 600 MG tablet Take 1 tablet (600 mg total) by mouth every 8 (eight) hours as needed for moderate pain. 09/11/17   Argentina Donovan, PA-C   meloxicam (MOBIC) 15 MG tablet Take 1 tablet (15 mg total) by mouth daily. 06/02/18   Charlott Rakes, MD  Multiple Vitamin (ONE-A-DAY MENS PO) Take 2 tablets by mouth daily.    [provider]  nicotine (NICODERM CQ - DOSED IN MG/24 HOURS) 14 mg/24hr patch Place 1 patch (14 mg total) daily onto the skin. Patient not taking: Reported on 06/02/2018 12/25/16   Charlott Rakes, MD  nicotine polacrilex (NICORETTE) 4 MG gum Take 1 each (4 mg total) by mouth as needed for smoking cessation. Patient not taking: Reported on 06/02/2018 04/21/15   Loleta Chance, MD  omeprazole (PRILOSEC) 40 MG capsule Take 1 capsule (40 mg total) by mouth daily. 06/02/18   Charlott Rakes, MD  ondansetron (ZOFRAN ODT) 4 MG disintegrating tablet Take 1 tablet (4 mg total) by mouth every 6 (six) hours as needed. 02/25/18   Ward, Delice Bison, DO  potassium chloride SA (K-DUR,KLOR-CON) 20 MEQ tablet Take 1 tablet (20 mEq total) by mouth 2 (two) times daily. 02/25/18   Ward, Delice Bison, DO  predniSONE (DELTASONE) 20 MG tablet Take 1 tablet (20 mg total) by mouth 2 (two) times daily with a meal. 08/08/18   Charlott Rakes, MD    Family History Family History  Problem Relation Age of Onset  . Stroke Mother   . Diabetes type II Mother     Social History Social History   Tobacco Use  . Smoking status: Current Every Day Smoker    Packs/day: 0.50    Years: 23.00    Pack years: 11.50    Types: Cigarettes    Start date: 04/21/1987  . Smokeless tobacco: Never Used  . Tobacco comment: Patches   Substance Use Topics  . Alcohol use: Yes    Alcohol/week: 2.0 standard drinks    Types: 2 Cans of beer per week    Comment: "drink only on the weekends"  . Drug use: No     Allergies   Penicillins   Review of Systems Review of Systems  Constitutional: Positive for fatigue.  Respiratory: Positive for cough and shortness of breath.   Cardiovascular: Positive for chest pain.  Gastrointestinal: Positive for nausea and vomiting.   All other systems reviewed and are negative.    Physical Exam Updated Vital Signs BP (!) 153/105   Pulse (!) 123   Temp 99.7 F (37.6 C) (Oral)   Resp 18   Ht 5\' 7"  (1.702 m)   Wt 98 kg   SpO2 100%   BMI 33.84 kg/m   Physical Exam Vitals signs and nursing note reviewed.  Constitutional:      Appearance: He is well-developed.  HENT:     Head: Normocephalic and atraumatic.     Ears:     Comments: Fluid behind left TM, no signs of rupture, EAC normal in  appearance right ear normal    Nose: Congestion present.     Mouth/Throat:     Lips: Pink.     Mouth: Mucous membranes are moist.     Pharynx: Oropharynx is clear. Uvula midline.  Eyes:     Conjunctiva/sclera: Conjunctivae normal.     Pupils: Pupils are equal, round, and reactive to light.  Neck:     Musculoskeletal: Normal range of motion.  Cardiovascular:     Rate and Rhythm: Normal rate and regular rhythm.     Heart sounds: Normal heart sounds.     Comments: Heart rate 90 during assessment Pulmonary:     Effort: Pulmonary effort is normal.     Breath sounds: Normal breath sounds.     Comments: Easy work of breathing, able to speak in full sentences, no acute distress, O2 sats 100% on RA Abdominal:     General: Bowel sounds are normal.     Palpations: Abdomen is soft.  Musculoskeletal: Normal range of motion.  Skin:    General: Skin is warm and dry.  Neurological:     Mental Status: He is alert and oriented to person, place, and time.      ED Treatments / Results  Labs (all labs ordered are listed, but only abnormal results are displayed) Labs Reviewed  BASIC METABOLIC PANEL - Abnormal; Notable for the following components:      Result Value   Glucose, Bld 118 (*)    Creatinine, Ser 1.39 (*)    GFR calc non Af Amer 60 (*)    All other components within normal limits  CBC - Abnormal; Notable for the following components:   WBC 14.6 (*)    All other components within normal limits  TROPONIN I (HIGH  SENSITIVITY)    EKG EKG Interpretation  Date/Time:  Sunday August 17 2018 22:30:39 EDT Ventricular Rate:  118 PR Interval:  122 QRS Duration: 82 QT Interval:  340 QTC Calculation: 476 R Axis:   -11 Text Interpretation:  Sinus tachycardia Otherwise normal ECG No significant change since last tracing other than rate is faster QT interval improved compared to previous Confirmed by Pryor Curia [phone]) on 08/18/2018 2:50:55 AM   Radiology Dg Chest Port 1 View  Result Date: 08/17/2018 CLINICAL DATA:  Cough, fever and congestion EXAM: PORTABLE CHEST 1 VIEW COMPARISON:  Chest radiograph 03/03/2016 FINDINGS: Stable cardiac and mediastinal contours. Low lung volumes. Bibasilar heterogeneous opacities. No pleural effusion or pneumothorax. IMPRESSION: Low lung volumes with bibasilar atelectasis. Electronically Signed   By: Lovey Newcomer M.D.   On: 08/17/2018 23:10    Procedures Procedures (including critical care time)  Medications Ordered in ED Medications  acetaminophen (TYLENOL) tablet 1,000 mg (1,000 mg Oral Given 08/18/18 0248)  ondansetron (ZOFRAN-ODT) disintegrating tablet 4 mg (4 mg Oral Given 08/18/18 0249)  albuterol (VENTOLIN HFA) 108 (90 Base) MCG/ACT inhaler 2 puff (2 puffs Inhalation Given 08/18/18 0249)     Initial Impression / Assessment and Plan / ED Course  I have reviewed the triage vital signs and the nursing notes.  Pertinent labs & imaging results that were available during my care of the patient were reviewed by me and considered in my medical decision making (see chart for details).  48 year old male here complaining of not feeling well for about 2 days.  He states "I feel like I have the flu but worse".  He reports positive COVID exposure at work last week, he did have outpatient test done on Thursday but has  not yet gotten results.  He has low-grade fever here and was initially tachycardic, but he reports this was because he was nervous.  By time of my evaluation his heart  rate is stable at 90.  He is nontoxic in appearance.  He does have some fluid behind the left TM but no signs of ear infection.  His lungs are clear without any wheezes or rhonchi and he is in no acute distress.  O2 sats are 100% on room air.  Screening labs are overall reassuring, troponin negative.  Chest x-ray is clear.  Given the nature of patient's symptoms and his known exposure, I do have a very high index of suspicion for COVID-19 which I have discussed with him.   I have lower suspicion for ACS, PE, dissection, acute cardiac event.  At this point he is hemodynamically stable with normal oxygen saturation on room air and does not appear to need admission or elevated level of care.  I discussed with him symptomatic care at home including Tylenol for fever, Zofran for nausea, and albuterol as needed.  He does not have any acute infiltrates on chest x-ray so we will hold on antibiotics at this time.  He will need to continue to quarantine at home as he has been doing until test results return.  If positive, he will need to quarantine further as per CDC guidelines.  Close follow-up with PCP.  Return here for any new or acute changes.  JACEYON STROLE was evaluated in Emergency Department on 08/18/2018 for the symptoms described in the history of present illness. He was evaluated in the context of the global COVID-19 pandemic, which necessitated consideration that the patient might be at risk for infection with the SARS-CoV-2 virus that causes COVID-19. Institutional protocols and algorithms that pertain to the evaluation of patients at risk for COVID-19 are in a state of rapid change based on information released by regulatory bodies including the CDC and federal and state organizations. These policies and algorithms were followed during the patient's care in the ED.   Final Clinical Impressions(s) / ED Diagnoses   Final diagnoses:  Cough  Chest pain in adult  Non-intractable vomiting with nausea,  unspecified vomiting type    ED Discharge Orders         Ordered    ondansetron (ZOFRAN ODT) 4 MG disintegrating tablet  Every 8 hours PRN     08/18/18 0332           Larene Pickett, PA-C 08/18/18 [place], Delice Bison, DO 08/18/18 [phone]

## 2018-08-19 ENCOUNTER — Ambulatory Visit: Payer: BC Managed Care – PPO | Admitting: Orthopaedic Surgery

## 2018-09-01 ENCOUNTER — Other Ambulatory Visit: Payer: Self-pay | Admitting: Family Medicine

## 2018-09-01 DIAGNOSIS — G8929 Other chronic pain: Secondary | ICD-10-CM

## 2018-09-09 ENCOUNTER — Other Ambulatory Visit: Payer: Self-pay

## 2018-09-09 ENCOUNTER — Emergency Department (HOSPITAL_COMMUNITY)
Admission: EM | Admit: 2018-09-09 | Discharge: 2018-09-10 | Disposition: A | Discharge status: Home / Self Care | Payer: BC Managed Care – PPO | Attending: Emergency Medicine | Admitting: Emergency Medicine

## 2018-09-09 ENCOUNTER — Encounter (HOSPITAL_COMMUNITY): Payer: Self-pay

## 2018-09-09 DIAGNOSIS — I10 Essential (primary) hypertension: Secondary | ICD-10-CM | POA: Insufficient documentation

## 2018-09-09 DIAGNOSIS — K439 Ventral hernia without obstruction or gangrene: Secondary | ICD-10-CM | POA: Diagnosis not present

## 2018-09-09 DIAGNOSIS — F17210 Nicotine dependence, cigarettes, uncomplicated: Secondary | ICD-10-CM | POA: Insufficient documentation

## 2018-09-09 DIAGNOSIS — R0902 Hypoxemia: Secondary | ICD-10-CM | POA: Diagnosis not present

## 2018-09-09 DIAGNOSIS — K430 Incisional hernia with obstruction, without gangrene: Secondary | ICD-10-CM | POA: Diagnosis not present

## 2018-09-09 DIAGNOSIS — R1084 Generalized abdominal pain: Secondary | ICD-10-CM | POA: Diagnosis not present

## 2018-09-09 DIAGNOSIS — R1033 Periumbilical pain: Secondary | ICD-10-CM | POA: Diagnosis not present

## 2018-09-09 DIAGNOSIS — Z79899 Other long term (current) drug therapy: Secondary | ICD-10-CM | POA: Diagnosis not present

## 2018-09-09 DIAGNOSIS — R52 Pain, unspecified: Secondary | ICD-10-CM | POA: Diagnosis not present

## 2018-09-09 LAB — CBC WITH DIFFERENTIAL/PLATELET
Abs Immature Granulocytes: 0.04 10*3/uL (ref 0.00–0.07)
Basophils Absolute: 0.1 10*3/uL (ref 0.0–0.1)
Basophils Relative: 1 %
Eosinophils Absolute: 0.1 10*3/uL (ref 0.0–0.5)
Eosinophils Relative: 1 %
HCT: 47.9 % (ref 39.0–52.0)
Hemoglobin: 15.2 g/dL (ref 13.0–17.0)
Immature Granulocytes: 0 %
Lymphocytes Relative: 19 %
Lymphs Abs: 2.1 10*3/uL (ref 0.7–4.0)
MCH: 27.2 pg (ref 26.0–34.0)
MCHC: 31.7 g/dL (ref 30.0–36.0)
MCV: 85.8 fL (ref 80.0–100.0)
Monocytes Absolute: 0.7 10*3/uL (ref 0.1–1.0)
Monocytes Relative: 6 %
Neutro Abs: 8.2 10*3/uL — ABNORMAL HIGH (ref 1.7–7.7)
Neutrophils Relative %: 73 %
Platelets: 263 10*3/uL (ref 150–400)
RBC: 5.58 MIL/uL (ref 4.22–5.81)
RDW: 14.3 % (ref 11.5–15.5)
WBC: 11.2 10*3/uL — ABNORMAL HIGH (ref 4.0–10.5)
nRBC: 0 % (ref 0.0–0.2)

## 2018-09-09 MED ORDER — ONDANSETRON HCL 4 MG/2ML IJ SOLN
4.0000 mg | Freq: Once | INTRAMUSCULAR | Status: AC
  Administered 2018-09-09: 4 mg via INTRAVENOUS
  Filled 2018-09-09: qty 2

## 2018-09-09 MED ORDER — MORPHINE SULFATE (PF) 4 MG/ML IV SOLN
4.0000 mg | Freq: Once | INTRAVENOUS | Status: AC
  Administered 2018-09-09: 4 mg via INTRAVENOUS
  Filled 2018-09-09: qty 1

## 2018-09-09 MED ORDER — SODIUM CHLORIDE 0.9 % IV BOLUS
1000.0000 mL | Freq: Once | INTRAVENOUS | Status: AC
  Administered 2018-09-09: 1000 mL via INTRAVENOUS

## 2018-09-09 NOTE — ED Triage Notes (Signed)
Per EMS, patient coming from home with complaints of left sided abdominal pain. Patient has a mass on the left side that he noticed about three months ago but has not gotten it checked out. Patient took two shots of vodka to try to decrease the pain but when it was unsuccessful, he called EMS. Patient endorses nausea and vomiting, but denies diarrhea. Has had a decrease in bowel movements. Patient is currently rating pain 10/10.

## 2018-09-09 NOTE — ED Notes (Signed)
Pt placed on cardiac monitoring due to elevated HR.

## 2018-09-09 NOTE — ED Notes (Signed)
Pt is aware a urine specimen is needed and knows to call staff when able to void.  

## 2018-09-10 ENCOUNTER — Encounter (HOSPITAL_COMMUNITY): Payer: Self-pay

## 2018-09-10 ENCOUNTER — Emergency Department (HOSPITAL_COMMUNITY): Payer: BC Managed Care – PPO

## 2018-09-10 ENCOUNTER — Encounter: Payer: Self-pay | Admitting: Family Medicine

## 2018-09-10 ENCOUNTER — Ambulatory Visit (HOSPITAL_BASED_OUTPATIENT_CLINIC_OR_DEPARTMENT_OTHER): Payer: BC Managed Care – PPO | Admitting: Family Medicine

## 2018-09-10 DIAGNOSIS — F419 Anxiety disorder, unspecified: Secondary | ICD-10-CM

## 2018-09-10 DIAGNOSIS — K432 Incisional hernia without obstruction or gangrene: Secondary | ICD-10-CM | POA: Diagnosis not present

## 2018-09-10 DIAGNOSIS — I10 Essential (primary) hypertension: Secondary | ICD-10-CM

## 2018-09-10 DIAGNOSIS — K439 Ventral hernia without obstruction or gangrene: Secondary | ICD-10-CM | POA: Diagnosis not present

## 2018-09-10 DIAGNOSIS — K219 Gastro-esophageal reflux disease without esophagitis: Secondary | ICD-10-CM

## 2018-09-10 DIAGNOSIS — Z636 Dependent relative needing care at home: Secondary | ICD-10-CM

## 2018-09-10 DIAGNOSIS — E7800 Pure hypercholesterolemia, unspecified: Secondary | ICD-10-CM | POA: Diagnosis not present

## 2018-09-10 LAB — LIPASE, BLOOD: Lipase: 22 U/L (ref 11–51)

## 2018-09-10 LAB — COMPREHENSIVE METABOLIC PANEL
ALT: 29 U/L (ref 0–44)
AST: 36 U/L (ref 15–41)
Albumin: 4.2 g/dL (ref 3.5–5.0)
Alkaline Phosphatase: 85 U/L (ref 38–126)
Anion gap: 11 (ref 5–15)
BUN: 10 mg/dL (ref 6–20)
CO2: 26 mmol/L (ref 22–32)
Calcium: 8.9 mg/dL (ref 8.9–10.3)
Chloride: 101 mmol/L (ref 98–111)
Creatinine, Ser: 1.22 mg/dL (ref 0.61–1.24)
GFR calc Af Amer: >60 mL/min (ref >60)
GFR calc non Af Amer: >60 mL/min (ref >60)
Glucose, Bld: 104 mg/dL — ABNORMAL HIGH (ref 70–99)
Potassium: 3.7 mmol/L (ref 3.5–5.1)
Sodium: 138 mmol/L (ref 135–145)
Total Bilirubin: 0.8 mg/dL (ref 0.3–1.2)
Total Protein: 7.8 g/dL (ref 6.5–8.1)

## 2018-09-10 MED ORDER — HYDROCHLOROTHIAZIDE 25 MG PO TABS: 25.0000 mg | ORAL_TABLET | Freq: Every day | ORAL | 6 refills | Status: DC

## 2018-09-10 MED ORDER — ONDANSETRON 4 MG PO TBDP: 4.0000 mg | ORAL_TABLET | Freq: Three times a day (TID) | ORAL | 0 refills | Status: DC | PRN

## 2018-09-10 MED ORDER — GABAPENTIN 300 MG PO CAPS: 300.0000 mg | ORAL_CAPSULE | Freq: Every day | ORAL | 6 refills | Status: DC

## 2018-09-10 MED ORDER — IOHEXOL 300 MG/ML  SOLN
100.0000 mL | Freq: Once | INTRAMUSCULAR | Status: AC | PRN
  Administered 2018-09-10: 01:00:00 100 mL via INTRAVENOUS

## 2018-09-10 MED ORDER — BUSPIRONE HCL 15 MG PO TABS: 15.0000 mg | ORAL_TABLET | Freq: Two times a day (BID) | ORAL | 1 refills | Status: DC

## 2018-09-10 MED ORDER — OMEPRAZOLE 40 MG PO CPDR: 40.0000 mg | DELAYED_RELEASE_CAPSULE | Freq: Every day | ORAL | 0 refills | Status: DC

## 2018-09-10 MED ORDER — SODIUM CHLORIDE (PF) 0.9 % IJ SOLN
INTRAMUSCULAR | Status: AC
  Administered 2018-09-10: 01:00:00
  Filled 2018-09-10: qty 50

## 2018-09-10 MED ORDER — ATORVASTATIN CALCIUM 20 MG PO TABS: 20.0000 mg | ORAL_TABLET | Freq: Every day | ORAL | 1 refills | Status: DC

## 2018-09-10 NOTE — ED Provider Notes (Signed)
[facility] DEPT Provider Note   CSN: [id number] Arrival date & time: 09/09/18  2122    History   Chief Complaint Chief Complaint  Patient presents with  . Abdominal Pain    HPI Timothy Mccarthy is a 48 y.o. male with history of HTN, GERD, appendectomy, incisional hernia s/p repair presents to the ER for evaluation of abdominal pain sudden onset 2 days ago.  When the pain began he doubled over in pain and felt nauseous, diaphoretic, had a hard time talking due to the severity of pain.  Pain is around his bellybutton where his surgical incision is.  For the last 2-3 weeks he has noticed "a knot" on his abdomen that comes out if he stands up.  He has discomfort on his abdomen if he presses on the "knot", lays on his abdomen.  He has to lay on his back or left side to help with the pain.  He has also noticed decreased BMs, last today around 5 but smaller than usual. He had Ramen noodles around 5 PM today and had return of his abdominal pain, nausea and vomiting.  He feels like there is still gas buildup in his stomach and he cannot let it out.  He has not passed gas since this. Admits to recent increase in ETOH intake.  Initially only drank occasionally but has had significant family stress at home and in the last week has drank a lot more.  He took 2 shots of vodka to help with the pain.  No other interventions. He is taking a "purple" pill for his acid reflux. 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   No fever, hematemesis, coffee ground emesis, CP, SOB, dysuria, hematuria, melena, hematochezia. No distal paresthesias or numbness distally. No back pain or CP.     HPI  Past Medical History:  Diagnosis Date  . Adjustment disorder with anxious mood   . Anxiety   . Arthritis    "hands" (09/28/2015)  . Depression   . GERD (gastroesophageal reflux disease)   . Hypertension   . Tobacco use disorder     Patient Active Problem List   Diagnosis Date Noted  . Chronic pain of both knees 11/26/2017  . Hyperlipidemia 12/25/2016  . Anxiety 06/06/2016  . Essential hypertension 11/04/2015  . Abdominal hernia 09/28/2015  . Appendicitis 09/15/2015  . Tobacco use disorder 04/21/2015  . Adjustment disorder with anxious mood 04/21/2015  . Health care maintenance 04/21/2015  . GERD (gastroesophageal reflux disease) 04/21/2015    Past Surgical History:  Procedure Laterality Date  . APPENDECTOMY  09/15/2015  . CARPAL TUNNEL RELEASE Bilateral   . INCISIONAL HERNIA REPAIR N/A 09/29/2015   Procedure: HERNIA REPAIR INCISIONAL;  Surgeon: Erroll Luna, MD;  Location: [facility];  Service: General;  Laterality: N/A;  . LAPAROSCOPIC APPENDECTOMY N/A 09/15/2015   Procedure: APPENDECTOMY LAPAROSCOPIC;  Surgeon: Judeth Horn, MD;  Location: [facility];  Service: General;  Laterality: N/A;  . LUMPS ON BACK  2013  . WISDOM TOOTH EXTRACTION          Home Medications    Prior to Admission medications   Medication Sig Start Date End Date Taking? Authorizing Provider  atorvastatin (LIPITOR) 20 MG tablet Take 1 tablet (20 mg total) by mouth daily. 06/02/18  Yes Charlott Rakes, MD  busPIRone (BUSPAR) 15 MG tablet Take 1 tablet (15 mg total) by mouth 2 (two) times daily. 06/02/18  Yes Charlott Rakes, MD  cetirizine (ZYRTEC) 10  MG tablet Take 1 tablet (10 mg total) by mouth daily. Patient taking differently: Take 10 mg by mouth daily as needed for allergies.  06/02/18  Yes Newlin, Charlane Ferretti, MD  fluticasone (FLONASE) 50 MCG/ACT nasal spray Place 2 sprays into both nostrils daily. 06/02/18  Yes Charlott Rakes, MD  gabapentin (NEURONTIN) 300 MG capsule Take 1 capsule (300 mg total) by mouth at bedtime. 11/26/17  Yes Charlott Rakes, MD  hydrochlorothiazide (HYDRODIURIL) 25 MG tablet Take 1 tablet (25 mg total) by mouth daily. 06/02/18  Yes Charlott Rakes, MD  meloxicam (MOBIC) 15 MG tablet Take 1 tablet by mouth daily 09/01/18  Yes Newlin, Enobong, MD   Multiple Vitamin (ONE-A-DAY MENS PO) Take 2 tablets by mouth daily.   Yes [provider]  omeprazole (PRILOSEC) 40 MG capsule Take 1 capsule (40 mg total) by mouth daily. 06/02/18  Yes Newlin, Charlane Ferretti, MD  ondansetron (ZOFRAN ODT) 4 MG disintegrating tablet Take 1 tablet (4 mg total) by mouth every 8 (eight) hours as needed for nausea. 08/18/18  Yes Larene Pickett, PA-C  ibuprofen (ADVIL,MOTRIN) 600 MG tablet Take 1 tablet (600 mg total) by mouth every 8 (eight) hours as needed for moderate pain. Patient not taking: Reported on 09/09/2018 09/11/17   Argentina Donovan, PA-C  nicotine (NICODERM CQ - DOSED IN MG/24 HOURS) 14 mg/24hr patch Place 1 patch (14 mg total) daily onto the skin. Patient not taking: Reported on 06/02/2018 12/25/16   Charlott Rakes, MD  nicotine polacrilex (NICORETTE) 4 MG gum Take 1 each (4 mg total) by mouth as needed for smoking cessation. Patient not taking: Reported on 06/02/2018 04/21/15   Loleta Chance, MD  potassium chloride SA (K-DUR,KLOR-CON) 20 MEQ tablet Take 1 tablet (20 mEq total) by mouth 2 (two) times daily. Patient not taking: Reported on 09/09/2018 02/25/18   Ward, Delice Bison, DO  predniSONE (DELTASONE) 20 MG tablet Take 1 tablet (20 mg total) by mouth 2 (two) times daily with a meal. Patient not taking: Reported on 09/09/2018 08/08/18   Charlott Rakes, MD    Family History Family History  Problem Relation Age of Onset  . Stroke Mother   . Diabetes type II Mother     Social History Social History   Tobacco Use  . Smoking status: Current Every Day Smoker    Packs/day: 0.50    Years: 23.00    Pack years: 11.50    Types: Cigarettes    Start date: 04/21/1987  . Smokeless tobacco: Never Used  . Tobacco comment: Patches   Substance Use Topics  . Alcohol use: Yes    Alcohol/week: 4.0 standard drinks    Types: 2 Cans of beer, 2 Shots of liquor per week    Comment: "takes shots whenever has pain"  . Drug use: No     Allergies   Penicillins    Review of Systems Review of Systems  Gastrointestinal: Positive for abdominal pain, constipation, nausea and vomiting.  All other systems reviewed and are negative.    Physical Exam Updated Vital Signs BP (!) 147/100 (BP Location: Left Arm)   Pulse 91   Temp 98.6 F (37 C) (Oral)   Resp 12   Ht 5\' 7"  (1.702 m)   Wt 98 kg   SpO2 95%   BMI 33.84 kg/m   Physical Exam Vitals signs and nursing note reviewed.  Constitutional:      Appearance: He is well-developed.     Comments: Non toxic.  HENT:     Head:  Normocephalic and atraumatic.     Nose: Nose normal.  Eyes:     Conjunctiva/sclera: Conjunctivae normal.  Neck:     Musculoskeletal: Normal range of motion.  Cardiovascular:     Rate and Rhythm: Normal rate and regular rhythm.     Heart sounds: Normal heart sounds.     Comments: 1+ radial and DP pulses bilaterally Pulmonary:     Effort: Pulmonary effort is normal.     Breath sounds: Normal breath sounds.  Abdominal:     General: Bowel sounds are normal.     Palpations: Abdomen is soft.     Tenderness: There is abdominal tenderness in the periumbilical area.     Hernia: A hernia is present.     Comments: Midline surgical scar noted well healed, there is incisional hernia to left of surgical scar only prominent with standing up, easily reducible with moderate pain.  When flat, no abd hernia noted but there is midline surgical scar tenderness. No distention. No G/R/R. No suprapubic or CVA tenderness. Negative Murphy's and McBurney's  Musculoskeletal: Normal range of motion.  Skin:    General: Skin is warm and dry.     Capillary Refill: Capillary refill takes less than 2 seconds.  Neurological:     Mental Status: He is alert.     Comments: Sensation and strength is intact in upper and lower extremities  Psychiatric:        Behavior: Behavior normal.      ED Treatments / Results  Labs (all labs ordered are listed, but only abnormal results are displayed) Labs  Reviewed  CBC WITH DIFFERENTIAL/PLATELET - Abnormal; Notable for the following components:      Result Value   WBC 11.2 (*)    Neutro Abs 8.2 (*)    All other components within normal limits  COMPREHENSIVE METABOLIC PANEL - Abnormal; Notable for the following components:   Glucose, Bld 104 (*)    All other components within normal limits  LIPASE, BLOOD  URINALYSIS, ROUTINE W REFLEX MICROSCOPIC    EKG None  Radiology No results found.  Procedures Procedures (including critical care time)  Medications Ordered in ED Medications  morphine 4 MG/ML injection 4 mg (4 mg Intravenous Given 09/09/18 2332)  ondansetron (ZOFRAN) injection 4 mg (4 mg Intravenous Given 09/09/18 2332)  sodium chloride 0.9 % bolus 1,000 mL (1,000 mLs Intravenous New Bag/Given 09/09/18 2331)     Initial Impression / Assessment and Plan / ED Course  I have reviewed the triage vital signs and the nursing notes.  Pertinent labs & imaging results that were available during my care of the patient were reviewed by me and considered in my medical decision making (see chart for details).  Clinical Course as of Sep 09 44  Wed Sep 10, 2018  0029 WBC(!): 11.2 [CG]    Clinical Course User Index [CG] Kinnie Feil, [place]   48 year old s/p appendectomy, hernia repair, GERD presents with bulging, tenderness to previous ventral surgical scar.  Also reports sudden onset of pain, nausea, vomiting, decreased BMs and not passing gas for the last few hours.  On exam he has obvious incisional hernia with standing up only, moderately tender but easily reducible.  DDX includes symptomatic hernia versus hernia complication although this is less likely.  Given his symptoms, previous multiple surgeries he is considered high risk for hernia complication, SBO.  He has no distal pulse or neuro deficits and bulging is unlikely to be AAA, vascular etiology.  This could  also be gastritis/GERD given his recent increase in ETOH. He has  no urinary symptoms and doubt UTI/pyelonephritis.  Your work-up reviewed by me remarkable for mild leukocytosis.  Will obtain CT A/P for further evaluation.  0045: Patient reevaluated and has had moderate improvement in the symptoms.  CT A/P and urine pending.  Patient will be handed off to oncoming ED PA who will follow-up on CT A/P, reassess, determine disposition.  If benign, anticipate discharge with general surgery follow-up. Encouraged ETOH cessation, diet changes to help manage gastritis/GERD, zofran.  Final Clinical Impressions(s) / ED Diagnoses   Final diagnoses:  Incisional hernia with obstruction but no gangrene    ED Discharge Orders    None       Kinnie Feil, PA-C 09/10/18 3888    Gareth Morgan, MD 09/11/18 [phone]

## 2018-09-10 NOTE — ED Provider Notes (Signed)
48 year old male received at sign out from [facility] pending CT A/P. Per her HPI:   "TEREZ FREIMARK is a 48 y.o. male with history of HTN, GERD, appendectomy, incisional hernia s/p repair presents to the ER for evaluation of abdominal pain sudden onset 2 days ago.  When the pain began he doubled over in pain and felt nauseous, diaphoretic, had a hard time talking due to the severity of pain.  Pain is around his bellybutton where his surgical incision is.  For the last 2-3 weeks he has noticed "a knot" on his abdomen that comes out if he stands up.  He has discomfort on his abdomen if he presses on the "knot", lays on his abdomen.  He has to lay on his back or left side to help with the pain.  He has also noticed decreased BMs, last today around 5 but smaller than usual. He had Ramen noodles around 5 PM today and had return of his abdominal pain, nausea and vomiting.  He feels like there is still gas buildup in his stomach and he cannot let it out.  He has not passed gas since this. Admits to recent increase in ETOH intake.  Initially only drank occasionally but has had significant family stress at home and in the last week has drank a lot more.  He took 2 shots of vodka to help with the pain.  No other interventions. He is taking a "purple" pill for his acid reflux. 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   No fever, hematemesis, coffee ground emesis, CP, SOB, dysuria, hematuria, melena, hematochezia. No distal paresthesias or numbness distally. No back pain or CP."    Physical Exam  BP (!) 147/105 (BP Location: Left Arm)   Pulse 94   Temp 98.6 F (37 C) (Oral)   Resp 16   Ht 5\' 7"  (1.702 m)   Wt 98 kg   SpO2 96%   BMI 33.84 kg/m   Physical Exam Constitutional:      General: He is not in acute distress.    Appearance: He is well-developed. He is not ill-appearing, toxic-appearing or diaphoretic.  HENT:     Head: Normocephalic and atraumatic.  Neck:   Musculoskeletal: Neck supple.  Pulmonary:     Effort: Pulmonary effort is normal.  Neurological:     Mental Status: He is alert.     Cranial Nerves: No cranial nerve deficit.  Psychiatric:        Behavior: Behavior normal.     ED Course/Procedures   Clinical Course as of Sep 09 245  Wed Sep 10, 2018  0029 WBC(!): 11.2 [CG]    Clinical Course User Index [CG] Kinnie Feil, PA-C    Procedures  MDM   48 year old male received a signout from [facility] pending CT abdomen pelvis.  Please see her note for further work-up and medical decision making.  CT abdomen pelvis is unremarkable.  He is hemodynamically stable and tachycardia has improved since he was treated with IV fluids in the ER.  Patient was successfully PO fluid challenged. Will discharge per plan from [facility] with ibuprofen, zofran and central [place] surgery follow up.  He was also advised to cut back on alcohol usage.  He is hemodynamically stable and in no acute distress.  Safe for discharge to home with outpatient follow-up.    Joline Maxcy A, PA-C 09/10/18 [place], Delice Bison, DO 09/10/18 2993

## 2018-09-10 NOTE — Progress Notes (Signed)
Patient verified DOB Patient has taken medication. Patient has eaten today. Patient complains of pain in the left side for a few days and was seen in ER.

## 2018-09-10 NOTE — ED Notes (Signed)
Pt attempted to provide urine specimen but said he did not have the urge to void currently.

## 2018-09-10 NOTE — Discharge Instructions (Signed)
You were seen in the ER for abdominal pain, bulging  Lab work was normal. CT showed a hernia without complication. Sometimes hernias can be symptomatic, can cause abdominal pain.    Avoid heavy lifting, straining, constipation.  Take tylenol for pain. Zofran for nausea.   Return to ER for worsening sudden pain, persistent vomiting, inability to pass bowel movements or gas, urinary symptoms

## 2018-09-10 NOTE — Progress Notes (Signed)
Virtual Visit via Telephone Note  I connected with Timothy Mccarthy, on 09/10/2018 at 1:37 PM by telephone due to the COVID-19 pandemic and verified that I am speaking with the correct person using two identifiers.   Consent: I discussed the limitations, risks, security and privacy concerns of performing an evaluation and management service by telephone and the availability of in person appointments. I also discussed with the patient that there may be a patient responsible charge related to this service. The patient expressed understanding and agreed to proceed.   Location of Patient: Home  Location of Provider: Clinic   Persons participating in Telemedicine visit: Victoria Euceda Farrington-CMA Dr. Felecia Shelling     History of Present Illness: Timothy Mccarthy is a 48 year old male with a history of hypertension, GERD, anxiety who presents today for a follow-up visit.  He was referred to orthopedic at his last visit for possible tendinitis in his hand and he had to reschedule his appointment with an upcoming appointment next month. 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  Currently on BuSpar for anxiety.  He is currently working on getting his mother a primary care physician. He was seen at the ED yesterday for an inccisional hernia due to abdominal pain which has been present for the last 1 month associated nausea and vomiting.  He does have a previous history of appendectomy and previous hernia repair. He will be calling the surgeon to obtain an appointment. CT scan from earlier this morning revealed small supraumbilical ventral hernia containing fat, no evidence of bowel obstruction. Currently on Zofran for his nausea. He is compliant with his antihypertensive and his reflux is controlled.   Past Medical History:  Diagnosis Date  . Adjustment  disorder with anxious mood   . Anxiety   . Arthritis    "hands" (09/28/2015)  . Depression   . GERD (gastroesophageal reflux disease)   . Hypertension   . Tobacco use disorder    Allergies  Allergen Reactions  . Penicillins Anaphylaxis    Has patient had a PCN reaction causing immediate rash, facial/tongue/throat swelling, SOB or lightheadedness with hypotensionNO Has patient had a PCN reaction causing severe rash involving mucus membranes or skin necrosis: NO Has patient had a PCN reaction that required hospitalization NO Has patient had a PCN reaction occurring within the last 10 years: NO If all of the above answers are "NO", then may proceed with Cephalosporin use.    Current Outpatient Medications on File Prior to Visit  Medication Sig Dispense Refill  . atorvastatin (LIPITOR) 20 MG tablet Take 1 tablet (20 mg total) by mouth daily. 90 tablet 1  . busPIRone (BUSPAR) 15 MG tablet Take 1 tablet (15 mg total) by mouth 2 (two) times daily. 180 tablet 1  . cetirizine (ZYRTEC) 10 MG tablet Take 1 tablet (10 mg total) by mouth daily. (Patient taking differently: Take 10 mg by mouth daily as needed for allergies. ) 90 tablet 1  . fluticasone (FLONASE) 50 MCG/ACT nasal spray Place 2 sprays into both nostrils daily. 16 g 6  . gabapentin (NEURONTIN) 300 MG capsule Take 1 capsule (300 mg total) by mouth at bedtime. 30 capsule 6  . hydrochlorothiazide (HYDRODIURIL) 25 MG tablet Take 1 tablet (25 mg total) by mouth daily. 30 tablet 6  . meloxicam (MOBIC) 15  MG tablet Take 1 tablet by mouth daily 90 tablet 0  . Multiple Vitamin (ONE-A-DAY MENS PO) Take 2 tablets by mouth daily.    Marland Kitchen omeprazole (PRILOSEC) 40 MG capsule Take 1 capsule (40 mg total) by mouth daily. 90 capsule 0  . ondansetron (ZOFRAN ODT) 4 MG disintegrating tablet Take 1 tablet (4 mg total) by mouth every 8 (eight) hours as needed. 20 tablet 0  . [DISCONTINUED] potassium chloride SA (K-DUR,KLOR-CON) 20 MEQ tablet Take 1 tablet (20  mEq total) by mouth 2 (two) times daily. (Patient not taking: Reported on 09/09/2018) 14 tablet 0   No current facility-administered medications on file prior to visit.     Observations/Objective: Awake, alert, not in acute distress  Assessment and Plan: 1. Incisional hernia, without obstruction or gangrene No evidence of bowel obstruction Advised to call general surgery for an appointment  2. Anxiety Exacerbated by underlying caregiver stress Advised he would need psychotherapy Once he is able to secure PCS services from his mom if his symptoms persist will consider adding an SSRI We have discussed coping mechanisms - busPIRone (BUSPAR) 15 MG tablet; Take 1 tablet (15 mg total) by mouth 2 (two) times daily.  Dispense: 180 tablet; Refill: 1  3. Pure hypercholesterolemia Controlled Low-cholesterol diet - atorvastatin (LIPITOR) 20 MG tablet; Take 1 tablet (20 mg total) by mouth daily.  Dispense: 90 tablet; Refill: 1  4. Essential hypertension Stable Counseled on blood pressure goal of less than 130/80, low-sodium, DASH diet, medication compliance, 150 minutes of moderate intensity exercise per week. Discussed medication compliance, adverse effects. - hydrochlorothiazide (HYDRODIURIL) 25 MG tablet; Take 1 tablet (25 mg total) by mouth daily.  Dispense: 30 tablet; Refill: 6  5. Gastroesophageal reflux disease, esophagitis presence not specified Controlled - omeprazole (PRILOSEC) 40 MG capsule; Take 1 capsule (40 mg total) by mouth daily.  Dispense: 90 capsule; Refill: 0  6. Caregiver stress See #2 above   Follow Up Instructions: Return in about 3 months (around 12/11/2018) for medical conditions.    I discussed the assessment and treatment plan with the patient. The patient was provided an opportunity to ask questions and all were answered. The patient agreed with the plan and demonstrated an understanding of the instructions.   The patient was advised to call back or seek an  in-person evaluation if the symptoms worsen or if the condition fails to improve as anticipated.     I provided 20 minutes total of non-face-to-face time during this encounter including median intraservice time, reviewing previous notes, labs, imaging, medications, management and patient verbalized understanding.     Charlott Rakes, MD, FAAFP. [facility] and [place], [place]   09/10/2018, 1:37 PM

## 2018-09-16 ENCOUNTER — Ambulatory Visit: Payer: Self-pay | Admitting: General Surgery

## 2018-09-23 ENCOUNTER — Ambulatory Visit: Payer: Self-pay | Admitting: Surgery

## 2018-10-18 ENCOUNTER — Emergency Department (HOSPITAL_COMMUNITY)
Admission: EM | Admit: 2018-10-18 | Discharge: 2018-10-18 | Disposition: A | Discharge status: Home / Self Care | Payer: BC Managed Care – PPO | Attending: Emergency Medicine | Admitting: Emergency Medicine

## 2018-10-18 ENCOUNTER — Encounter (HOSPITAL_COMMUNITY): Payer: Self-pay | Admitting: Emergency Medicine

## 2018-10-18 ENCOUNTER — Other Ambulatory Visit: Payer: Self-pay

## 2018-10-18 DIAGNOSIS — E876 Hypokalemia: Secondary | ICD-10-CM | POA: Insufficient documentation

## 2018-10-18 DIAGNOSIS — R112 Nausea with vomiting, unspecified: Secondary | ICD-10-CM | POA: Diagnosis not present

## 2018-10-18 DIAGNOSIS — Z79899 Other long term (current) drug therapy: Secondary | ICD-10-CM | POA: Diagnosis not present

## 2018-10-18 DIAGNOSIS — F17210 Nicotine dependence, cigarettes, uncomplicated: Secondary | ICD-10-CM | POA: Diagnosis not present

## 2018-10-18 DIAGNOSIS — K439 Ventral hernia without obstruction or gangrene: Secondary | ICD-10-CM | POA: Diagnosis not present

## 2018-10-18 DIAGNOSIS — I10 Essential (primary) hypertension: Secondary | ICD-10-CM | POA: Diagnosis not present

## 2018-10-18 LAB — CBC WITH DIFFERENTIAL/PLATELET
Abs Immature Granulocytes: 0.03 10*3/uL (ref 0.00–0.07)
Basophils Absolute: 0.1 10*3/uL (ref 0.0–0.1)
Basophils Relative: 1 %
Eosinophils Absolute: 0.2 10*3/uL (ref 0.0–0.5)
Eosinophils Relative: 2 %
HCT: 46.8 % (ref 39.0–52.0)
Hemoglobin: 15.3 g/dL (ref 13.0–17.0)
Immature Granulocytes: 0 %
Lymphocytes Relative: 27 %
Lymphs Abs: 3 10*3/uL (ref 0.7–4.0)
MCH: 28 pg (ref 26.0–34.0)
MCHC: 32.7 g/dL (ref 30.0–36.0)
MCV: 85.7 fL (ref 80.0–100.0)
Monocytes Absolute: 0.8 10*3/uL (ref 0.1–1.0)
Monocytes Relative: 7 %
Neutro Abs: 7.2 10*3/uL (ref 1.7–7.7)
Neutrophils Relative %: 63 %
Platelets: 232 10*3/uL (ref 150–400)
RBC: 5.46 MIL/uL (ref 4.22–5.81)
RDW: 14.1 % (ref 11.5–15.5)
WBC: 11.2 10*3/uL — ABNORMAL HIGH (ref 4.0–10.5)
nRBC: 0 % (ref 0.0–0.2)

## 2018-10-18 LAB — COMPREHENSIVE METABOLIC PANEL
ALT: 32 U/L (ref 0–44)
AST: 29 U/L (ref 15–41)
Albumin: 3.9 g/dL (ref 3.5–5.0)
Alkaline Phosphatase: 89 U/L (ref 38–126)
Anion gap: 10 (ref 5–15)
BUN: 10 mg/dL (ref 6–20)
CO2: 27 mmol/L (ref 22–32)
Calcium: 9.1 mg/dL (ref 8.9–10.3)
Chloride: 102 mmol/L (ref 98–111)
Creatinine, Ser: 1.16 mg/dL (ref 0.61–1.24)
GFR calc Af Amer: >60 mL/min (ref >60)
GFR calc non Af Amer: >60 mL/min (ref >60)
Glucose, Bld: 99 mg/dL (ref 70–99)
Potassium: 3 mmol/L — ABNORMAL LOW (ref 3.5–5.1)
Sodium: 139 mmol/L (ref 135–145)
Total Bilirubin: 0.6 mg/dL (ref 0.3–1.2)
Total Protein: 6.6 g/dL (ref 6.5–8.1)

## 2018-10-18 LAB — LIPASE, BLOOD: Lipase: 20 U/L (ref 11–51)

## 2018-10-18 LAB — LACTIC ACID, PLASMA
Lactic Acid, Venous: 1 mmol/L (ref 0.5–1.9)
Lactic Acid, Venous: 0.9 mmol/L (ref 0.5–1.9)

## 2018-10-18 MED ORDER — ONDANSETRON HCL 4 MG/2ML IJ SOLN
4.0000 mg | Freq: Once | INTRAMUSCULAR | Status: AC
  Administered 2018-10-18: 4 mg via INTRAVENOUS
  Filled 2018-10-18: qty 2

## 2018-10-18 MED ORDER — POTASSIUM CHLORIDE CRYS ER 20 MEQ PO TBCR: 20.0000 meq | EXTENDED_RELEASE_TABLET | Freq: Every day | ORAL | 0 refills | Status: DC

## 2018-10-18 MED ORDER — HYDROCODONE-ACETAMINOPHEN 5-325 MG PO TABS: 1.0000 | ORAL_TABLET | Freq: Four times a day (QID) | ORAL | 0 refills | Status: DC | PRN

## 2018-10-18 MED ORDER — ONDANSETRON HCL 4 MG PO TABS: 4.0000 mg | ORAL_TABLET | Freq: Three times a day (TID) | ORAL | 0 refills | Status: DC | PRN

## 2018-10-18 MED ORDER — MORPHINE SULFATE (PF) 4 MG/ML IV SOLN
4.0000 mg | Freq: Once | INTRAVENOUS | Status: AC
  Administered 2018-10-18: 4 mg via INTRAVENOUS
  Filled 2018-10-18: qty 1

## 2018-10-18 NOTE — Discharge Instructions (Signed)
Take potassium daily.  Try and eat foods with high levels of potassium. Use Tylenol and ibuprofen as needed for mild to moderate pain.  Use Norco as needed for severe breakthrough pain.  Have caution, this may make you tired or grggoy.  Do not drive or operate heavy machinery while taking this medicine. You Zofran as needed for nausea or vomiting. Follow-up with general surgery for further evaluation management of your hernia. Return to the emergency room if you develop fevers, vomiting despite medication, severe worsening pain, hardness where the hernia is, inability to have a bowel movement, or any new, worsening, concerning symptoms.

## 2018-10-18 NOTE — ED Triage Notes (Signed)
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

## 2018-10-18 NOTE — ED Notes (Signed)
Patient Alert and oriented to baseline. Stable and ambulatory to baseline. Patient verbalized understanding of the discharge instructions.  Patient belongings were taken by the patient.   

## 2018-10-18 NOTE — ED Provider Notes (Signed)
[facility] EMERGENCY DEPARTMENT Provider Note   CSN: [id number] Arrival date & time: 10/18/18  [id number]     History   Chief Complaint No chief complaint on file.   HPI Timothy Mccarthy is a 48 y.o. male presenting for evaluation of nausea, vomiting, abdominal pain.  Patient states for the past 3 days, he has been having abdominal pain, nausea, vomiting.  Patient states this began when he lifts something heavy at work.  He does have a history of an dental hernia, but has not been able to follow-up with general surgery for management.  After he lifted a heavy object, he has been having persistent pain at the site of his hernia.  Yesterday and today he started develop nausea and vomiting, more specifically after he eats.  He denies fevers, chills, chest pain, shortness of breath, cough, urinary symptoms, abnormal bowel movements.  He reports a history of a hernia repair and appendectomy, no other abdominal surgeries.  Reports a history of hypertension, anxiety, and depression for which he takes medication.  No other medical problems.  He denies sick contacts.  He denies recent travel.  He has not taken anything for pain including Tylenol ibuprofen.  Movement and eating makes the pain worse, nothing makes it better.  Pain is constant.  Distal history obtained from chart review.  Patient was seen in July of this year for hernia.  At that time CT showed a small fat-containing hernia without obvious incarceration or obstruction.     HPI  Past Medical History:  Diagnosis Date   Adjustment disorder with anxious mood    Anxiety    Arthritis    "hands" (09/28/2015)   Depression    GERD (gastroesophageal reflux disease)    Hypertension    Tobacco use disorder     Patient Active Problem List   Diagnosis Date Noted   Chronic pain of both knees 11/26/2017   Hyperlipidemia 12/25/2016   Anxiety 06/06/2016   Essential hypertension 11/04/2015   Abdominal hernia  09/28/2015   Appendicitis 09/15/2015   Tobacco use disorder 04/21/2015   Adjustment disorder with anxious mood 04/21/2015   Health care maintenance 04/21/2015   GERD (gastroesophageal reflux disease) 04/21/2015    Past Surgical History:  Procedure Laterality Date   APPENDECTOMY  09/15/2015   CARPAL TUNNEL RELEASE Bilateral    INCISIONAL HERNIA REPAIR N/A 09/29/2015   Procedure: HERNIA REPAIR INCISIONAL;  Surgeon: Erroll Luna, MD;  Location: [facility];  Service: General;  Laterality: N/A;   LAPAROSCOPIC APPENDECTOMY N/A 09/15/2015   Procedure: APPENDECTOMY LAPAROSCOPIC;  Surgeon: Judeth Horn, MD;  Location: [facility];  Service: General;  Laterality: N/A;   LUMPS ON BACK  2013   WISDOM TOOTH EXTRACTION          Home Medications    Prior to Admission medications   Medication Sig Start Date End Date Taking? Authorizing Provider  atorvastatin (LIPITOR) 20 MG tablet Take 1 tablet (20 mg total) by mouth daily. 09/10/18  Yes Charlott Rakes, MD  busPIRone (BUSPAR) 15 MG tablet Take 1 tablet (15 mg total) by mouth 2 (two) times daily. 09/10/18  Yes Charlott Rakes, MD  cetirizine (ZYRTEC) 10 MG tablet Take 1 tablet (10 mg total) by mouth daily. 06/02/18  Yes Newlin, Charlane Ferretti, MD  fluticasone (FLONASE) 50 MCG/ACT nasal spray Place 2 sprays into both nostrils daily. 06/02/18  Yes Charlott Rakes, MD  gabapentin (NEURONTIN) 300 MG capsule Take 1 capsule (300 mg total) by mouth at bedtime. 09/10/18  Yes Charlott Rakes, MD  hydrochlorothiazide (HYDRODIURIL) 25 MG tablet Take 1 tablet (25 mg total) by mouth daily. 09/10/18  Yes Charlott Rakes, MD  meloxicam (MOBIC) 15 MG tablet Take 1 tablet by mouth daily Patient taking differently: Take 15 mg by mouth daily.  09/01/18  Yes Charlott Rakes, MD  Multiple Vitamin (MULTIVITAMIN WITH MINERALS) TABS tablet Take 2 tablets by mouth daily.    Yes [provider]  omeprazole (PRILOSEC) 40 MG capsule Take 1 capsule (40 mg total) by mouth daily.  09/10/18  Yes Newlin, Charlane Ferretti, MD  ondansetron (ZOFRAN ODT) 4 MG disintegrating tablet Take 1 tablet (4 mg total) by mouth every 8 (eight) hours as needed. 09/10/18  Yes McDonald, Mia A, PA-C  HYDROcodone-acetaminophen (NORCO/VICODIN) 5-325 MG tablet Take 1 tablet by mouth every 6 (six) hours as needed for severe pain. 10/18/18   Haydee Jabbour, PA-C  ondansetron (ZOFRAN) 4 MG tablet Take 1 tablet (4 mg total) by mouth every 8 (eight) hours as needed for nausea or vomiting. 10/18/18   Sharod Petsch, PA-C  potassium chloride SA (K-DUR) 20 MEQ tablet Take 1 tablet (20 mEq total) by mouth daily for 7 doses. 10/18/18 10/25/18  Shontel Santee, Jillyn Ledger, PA-C    Family History Family History  Problem Relation Age of Onset   Stroke Mother    Diabetes type II Mother     Social History Social History   Tobacco Use   Smoking status: Current Every Day Smoker    Packs/day: 0.50    Years: 23.00    Pack years: 11.50    Types: Cigarettes    Start date: 04/21/1987   Smokeless tobacco: Never Used   Tobacco comment: Patches   Substance Use Topics   Alcohol use: Yes    Alcohol/week: 4.0 standard drinks    Types: 2 Cans of beer, 2 Shots of liquor per week    Comment: "takes shots whenever has pain"   Drug use: No     Allergies   Penicillins   Review of Systems Review of Systems  Gastrointestinal: Positive for abdominal pain, nausea and vomiting.  All other systems reviewed and are negative.    Physical Exam Updated Vital Signs BP 135/90 (BP Location: Right Arm)    Pulse 94    Temp 99.2 F (37.3 C) (Oral)    Resp 16    Ht 5\' 7"  (1.702 m)    Wt 99.8 kg    SpO2 96%    BMI 34.46 kg/m   Physical Exam Vitals signs and nursing note reviewed.  Constitutional:      General: He is not in acute distress.    Appearance: He is well-developed.     Comments: Sitting in the bed in no acute distress  HENT:     Head: Normocephalic and atraumatic.  Eyes:     Conjunctiva/sclera: Conjunctivae normal.      Pupils: Pupils are equal, round, and reactive to light.  Neck:     Musculoskeletal: Normal range of motion and neck supple.  Cardiovascular:     Rate and Rhythm: Regular rhythm. Tachycardia present.     Pulses: Normal pulses.     Comments: Slight tachycardia at 105 Pulmonary:     Effort: Pulmonary effort is normal. No respiratory distress.     Breath sounds: Normal breath sounds. No wheezing.  Abdominal:     General: There is no distension.     Palpations: Abdomen is soft. There is no mass.     Tenderness: There is abdominal  tenderness. There is no guarding or rebound.     Hernia: A hernia is present. Hernia is present in the ventral area.       Comments: Tenderness to palpation over central abdomen.  Hernia is soft without erythema.  Abdomen without rigidity or distention.  Negative rebound.  Musculoskeletal: Normal range of motion.  Skin:    General: Skin is warm and dry.     Capillary Refill: Capillary refill takes less than 2 seconds.  Neurological:     Mental Status: He is alert and oriented to person, place, and time.      ED Treatments / Results  Labs (all labs ordered are listed, but only abnormal results are displayed) Labs Reviewed  CBC WITH DIFFERENTIAL/PLATELET - Abnormal; Notable for the following components:      Result Value   WBC 11.2 (*)    All other components within normal limits  COMPREHENSIVE METABOLIC PANEL - Abnormal; Notable for the following components:   Potassium 3.0 (*)    All other components within normal limits  LIPASE, BLOOD  LACTIC ACID, PLASMA  LACTIC ACID, PLASMA    EKG None  Radiology No results found.  Procedures Procedures (including critical care time)  Medications Ordered in ED Medications  ondansetron (ZOFRAN) injection 4 mg (4 mg Intravenous Given 10/18/18 1949)  morphine 4 MG/ML injection 4 mg (4 mg Intravenous Given 10/18/18 1949)     Initial Impression / Assessment and Plan / ED Course  I have reviewed the  triage vital signs and the nursing notes.  Pertinent labs & imaging results that were available during my care of the patient were reviewed by me and considered in my medical decision making (see chart for details).        Patient presenting for evaluation of nausea, vomiting, abdominal pain.  Physical exam reassuring, appears nontoxic.  He has tenderness over the hernia, but it is soft without erythema.  Not obviously incarcerated.  However, as patient reports nausea, vomiting, and worsening pain, will obtain labs.  If white count and lactic are elevated and patient's pain remains despite medication, consider need for repeat CT scan.  Labs show slight leukocytosis at 11.2, same as previous visit. Lactic negative.  Labs otherwise reassuring, although slight hypokalemia at 3.0.  On reassessment, patient reports pain is improved.  No further nausea.  Abdominal exam remains benign.  Will trial p.o. challenge.  If patient tolerates well, can likely go home, as I have low suspicion for incarceration or obstruction at this time.  Patient tolerated p.o. without difficulty.  Discuss symptomatic treatment at home, and follow-up with general surgery.  At this time, patient appears safe for discharge.  Return precautions given.  Patient states he understands and agrees to plan.   Final Clinical Impressions(s) / ED Diagnoses   Final diagnoses:  Ventral hernia without obstruction or gangrene  Hypokalemia    ED Discharge Orders         Ordered    HYDROcodone-acetaminophen (NORCO/VICODIN) 5-325 MG tablet  Every 6 hours PRN     10/18/18 2050    ondansetron (ZOFRAN) 4 MG tablet  Every 8 hours PRN     10/18/18 2050    potassium chloride SA (K-DUR) 20 MEQ tablet  Daily     10/18/18 2050           Franchot Heidelberg, PA-C 10/19/18 1157    Gareth Morgan, MD 10/22/18 0021

## 2018-10-30 ENCOUNTER — Encounter (HOSPITAL_BASED_OUTPATIENT_CLINIC_OR_DEPARTMENT_OTHER): Payer: BC Managed Care – PPO | Admitting: Family Medicine

## 2018-10-30 DIAGNOSIS — R110 Nausea: Secondary | ICD-10-CM

## 2018-10-30 DIAGNOSIS — R109 Unspecified abdominal pain: Secondary | ICD-10-CM

## 2018-10-30 DIAGNOSIS — K439 Ventral hernia without obstruction or gangrene: Secondary | ICD-10-CM

## 2018-10-31 MED ORDER — ACETAMINOPHEN-CODEINE #3 300-30 MG PO TABS: 1.0000 | ORAL_TABLET | Freq: Two times a day (BID) | ORAL | 0 refills | Status: DC | PRN

## 2018-10-31 NOTE — Telephone Encounter (Addendum)
I spoke with Ms. Timothy Mccarthy day who complained of persistent pain in his abdomen with associated nausea due to his ventral hernia.  He had an ED visit on 10/18/2018 and was prescribed Vicodin; he has no symptoms of strangulation or obstruction.  His work exacerbates the pain and he is wondering about a permanent solution. I have discussed with him I will be referring him to general surgery.  He is unable to see [facility] as his insurance owes them and they are requiring $1000 prior to seeing him.  I have offered other options including [facility], [place] and he would like to be referred to [facility].

## 2018-10-31 NOTE — Addendum Note (Signed)
Addended by: Charlott Rakes on: 10/31/2018 10:16 AM   Modules accepted: Orders

## 2018-11-13 DIAGNOSIS — K439 Ventral hernia without obstruction or gangrene: Secondary | ICD-10-CM | POA: Diagnosis not present

## 2018-11-13 DIAGNOSIS — F1911 Other psychoactive substance abuse, in remission: Secondary | ICD-10-CM | POA: Diagnosis not present

## 2018-11-13 DIAGNOSIS — Z6833 Body mass index (BMI) 33.0-33.9, adult: Secondary | ICD-10-CM | POA: Diagnosis not present

## 2018-11-13 DIAGNOSIS — F17210 Nicotine dependence, cigarettes, uncomplicated: Secondary | ICD-10-CM | POA: Diagnosis not present

## 2018-11-13 DIAGNOSIS — E669 Obesity, unspecified: Secondary | ICD-10-CM | POA: Diagnosis not present

## 2018-11-14 ENCOUNTER — Encounter: Payer: Self-pay | Admitting: Family Medicine

## 2018-11-18 NOTE — Telephone Encounter (Signed)
Patient was called and informed to bring paperwork to clinic

## 2018-11-25 ENCOUNTER — Other Ambulatory Visit: Payer: Self-pay

## 2018-11-25 ENCOUNTER — Inpatient Hospital Stay: Admission: RE | Admit: 2018-11-25 | Payer: BC Managed Care – PPO | Source: Ambulatory Visit

## 2018-11-25 ENCOUNTER — Inpatient Hospital Stay
Admission: RE | Admit: 2018-11-25 | Discharge: 2018-11-25 | Disposition: A | Discharge status: Home / Self Care | Payer: BC Managed Care – PPO | Source: Ambulatory Visit

## 2018-11-26 ENCOUNTER — Encounter: Payer: Self-pay | Admitting: Family Medicine

## 2018-12-02 ENCOUNTER — Encounter: Payer: Self-pay | Admitting: *Deleted

## 2018-12-02 ENCOUNTER — Other Ambulatory Visit: Payer: Self-pay | Admitting: Family Medicine

## 2018-12-02 DIAGNOSIS — J3089 Other allergic rhinitis: Secondary | ICD-10-CM

## 2018-12-02 DIAGNOSIS — M25562 Pain in left knee: Secondary | ICD-10-CM

## 2018-12-02 DIAGNOSIS — G8929 Other chronic pain: Secondary | ICD-10-CM

## 2018-12-09 ENCOUNTER — Emergency Department (HOSPITAL_COMMUNITY)
Admission: EM | Admit: 2018-12-09 | Discharge: 2018-12-09 | Discharge status: Against Medical Advice | Payer: BC Managed Care – PPO | Attending: Emergency Medicine | Admitting: Emergency Medicine

## 2018-12-09 ENCOUNTER — Other Ambulatory Visit: Payer: Self-pay

## 2018-12-09 ENCOUNTER — Encounter (HOSPITAL_COMMUNITY): Payer: Self-pay | Admitting: Emergency Medicine

## 2018-12-09 DIAGNOSIS — Z5321 Procedure and treatment not carried out due to patient leaving prior to being seen by health care provider: Secondary | ICD-10-CM | POA: Diagnosis not present

## 2018-12-09 DIAGNOSIS — R1033 Periumbilical pain: Secondary | ICD-10-CM | POA: Diagnosis not present

## 2018-12-09 LAB — COMPREHENSIVE METABOLIC PANEL
ALT: 38 U/L (ref 0–44)
AST: 29 U/L (ref 15–41)
Albumin: 4.1 g/dL (ref 3.5–5.0)
Alkaline Phosphatase: 79 U/L (ref 38–126)
Anion gap: 12 (ref 5–15)
BUN: 13 mg/dL (ref 6–20)
CO2: 27 mmol/L (ref 22–32)
Calcium: 9.6 mg/dL (ref 8.9–10.3)
Chloride: 101 mmol/L (ref 98–111)
Creatinine, Ser: 1.45 mg/dL — ABNORMAL HIGH (ref 0.61–1.24)
GFR calc Af Amer: >60 mL/min (ref >60)
GFR calc non Af Amer: 57 mL/min — ABNORMAL LOW (ref >60)
Glucose, Bld: 115 mg/dL — ABNORMAL HIGH (ref 70–99)
Potassium: 3.6 mmol/L (ref 3.5–5.1)
Sodium: 140 mmol/L (ref 135–145)
Total Bilirubin: 0.6 mg/dL (ref 0.3–1.2)
Total Protein: 7.1 g/dL (ref 6.5–8.1)

## 2018-12-09 LAB — CBC
HCT: 45.3 % (ref 39.0–52.0)
Hemoglobin: 14.7 g/dL (ref 13.0–17.0)
MCH: 27.6 pg (ref 26.0–34.0)
MCHC: 32.5 g/dL (ref 30.0–36.0)
MCV: 85.2 fL (ref 80.0–100.0)
Platelets: 270 10*3/uL (ref 150–400)
RBC: 5.32 MIL/uL (ref 4.22–5.81)
RDW: 13.3 % (ref 11.5–15.5)
WBC: 10.8 10*3/uL — ABNORMAL HIGH (ref 4.0–10.5)
nRBC: 0 % (ref 0.0–0.2)

## 2018-12-09 LAB — LIPASE, BLOOD: Lipase: 23 U/L (ref 11–51)

## 2018-12-09 MED ORDER — SODIUM CHLORIDE 0.9% FLUSH: 3.0000 mL | Freq: Once | INTRAVENOUS | Status: DC

## 2018-12-09 NOTE — ED Notes (Signed)
No answer for vitals x2 

## 2018-12-09 NOTE — ED Notes (Signed)
No answer x3

## 2018-12-09 NOTE — ED Notes (Signed)
No answer for vitals x1 

## 2018-12-09 NOTE — ED Triage Notes (Signed)
Patient reports constipation for 2 days and mid abdominal pain this evening with emesis x2 , denies diarrhea . No fever or chills .

## 2018-12-10 ENCOUNTER — Ambulatory Visit: Payer: BC Managed Care – PPO | Attending: Family Medicine | Admitting: Family Medicine

## 2018-12-10 DIAGNOSIS — J3089 Other allergic rhinitis: Secondary | ICD-10-CM | POA: Diagnosis not present

## 2018-12-10 DIAGNOSIS — M25562 Pain in left knee: Secondary | ICD-10-CM | POA: Diagnosis not present

## 2018-12-10 DIAGNOSIS — M25561 Pain in right knee: Secondary | ICD-10-CM

## 2018-12-10 DIAGNOSIS — K432 Incisional hernia without obstruction or gangrene: Secondary | ICD-10-CM | POA: Diagnosis not present

## 2018-12-10 DIAGNOSIS — G8929 Other chronic pain: Secondary | ICD-10-CM

## 2018-12-10 MED ORDER — CETIRIZINE HCL 10 MG PO TABS: 10.0000 mg | ORAL_TABLET | Freq: Every day | ORAL | 1 refills | Status: DC

## 2018-12-10 MED ORDER — MELOXICAM 15 MG PO TABS: 15.0000 mg | ORAL_TABLET | Freq: Every day | ORAL | 1 refills | Status: DC

## 2018-12-10 NOTE — Progress Notes (Signed)
Virtual Visit via Telephone Note  I connected with Timothy Mccarthy, on 12/10/2018 at 9:35 AM by telephone due to the COVID-19 pandemic and verified that I am speaking with the correct person using two identifiers.   Consent: I discussed the limitations, risks, security and privacy concerns of performing an evaluation and management service by telephone and the availability of in person appointments. I also discussed with the patient that there may be a patient responsible charge related to this service. The patient expressed understanding and agreed to proceed.   Location of Patient: Home  Location of Provider: Clinic   Persons participating in Telemedicine visit: Traveion Rotundo Farrington-CMA Dr. Felecia Shelling     History of Present Illness: Timothy Mccarthy is a 48 year old male with a history of hypertension, GERD, anxiety, incisional hernia who presents today for completion of short-term disability paperwork for his incisional hernia. He has had this since 08/2018 with resulting abdominal pain, nausea with frequent ED and urgent care visits.  Pain is severe to the point where he has difficulty bending to tie his shoelaces and he has been out of work since 11/10/18. He had an ED visit again yesterday for his incisional hernia. Currently being seen by General Surgery at [facility] and is being worked up for surgery on 12/22/2018. Denies presence of constipation he is moving his bowels and passing gas okay. He is requesting refill of meloxicam which she takes for chronic knee pains and his antihistamine for allergies.  Past Medical History:  Diagnosis Date  . Adjustment disorder with anxious mood   . Anxiety   . Arthritis    "hands" (09/28/2015)  . Depression   . GERD (gastroesophageal reflux disease)   . Hypertension   . Tobacco use disorder    Allergies  Allergen Reactions  . Penicillins Anaphylaxis    Has patient had a PCN reaction causing immediate rash, facial/tongue/throat  swelling, SOB or lightheadedness with hypotensionNO Has patient had a PCN reaction causing severe rash involving mucus membranes or skin necrosis: NO Has patient had a PCN reaction that required hospitalization NO Has patient had a PCN reaction occurring within the last 10 years: NO If all of the above answers are "NO", then may proceed with Cephalosporin use.    Current Outpatient Medications on File Prior to Visit  Medication Sig Dispense Refill  . atorvastatin (LIPITOR) 20 MG tablet Take 1 tablet (20 mg total) by mouth daily. 90 tablet 1  . busPIRone (BUSPAR) 15 MG tablet Take 1 tablet (15 mg total) by mouth 2 (two) times daily. 180 tablet 1  . cetirizine (ZYRTEC) 10 MG tablet Take 1 tablet (10 mg total) by mouth daily. 90 tablet 1  . fluticasone (FLONASE) 50 MCG/ACT nasal spray Place 2 sprays into both nostrils daily. 16 g 6  . gabapentin (NEURONTIN) 300 MG capsule Take 1 capsule (300 mg total) by mouth at bedtime. 30 capsule 6  . hydrochlorothiazide (HYDRODIURIL) 25 MG tablet Take 1 tablet (25 mg total) by mouth daily. 30 tablet 6  . meloxicam (MOBIC) 15 MG tablet Take 1 tablet by mouth daily (Patient taking differently: Take 15 mg by mouth daily. ) 90 tablet 0  . Multiple Vitamin (MULTIVITAMIN WITH MINERALS) TABS tablet Take 2 tablets by mouth daily.     Marland Kitchen omeprazole (PRILOSEC) 40 MG capsule Take 1 capsule (40 mg total) by mouth daily. 90 capsule 0  . ondansetron (ZOFRAN ODT) 4 MG disintegrating tablet Take 1 tablet (4 mg total) by mouth every  8 (eight) hours as needed. 20 tablet 0  . ondansetron (ZOFRAN) 4 MG tablet Take 1 tablet (4 mg total) by mouth every 8 (eight) hours as needed for nausea or vomiting. 12 tablet 0  . acetaminophen-codeine (TYLENOL #3) 300-30 MG tablet Take 1 tablet by mouth every 12 (twelve) hours as needed for moderate pain. (Patient not taking: Reported on 12/10/2018) 30 tablet 0  . HYDROcodone-acetaminophen (NORCO/VICODIN) 5-325 MG tablet Take 1 tablet by mouth  every 6 (six) hours as needed for severe pain. (Patient not taking: Reported on 12/10/2018) 8 tablet 0  . potassium chloride SA (K-DUR) 20 MEQ tablet Take 1 tablet (20 mEq total) by mouth daily for 7 doses. 7 tablet 0   No current facility-administered medications on file prior to visit.     Observations/Objective: Awake, alert, oriented x3 Left no acute distress  Assessment and Plan: 1. Incisional hernia, without obstruction or gangrene Uncontrolled Upcoming surgery on 12/22/2018 Disability paperwork completed  2. Chronic pain of both knees Stable - meloxicam (MOBIC) 15 MG tablet; Take 1 tablet (15 mg total) by mouth daily.  Dispense: 90 tablet; Refill: 1  3. Seasonal allergic rhinitis due to other allergic trigger Controlled - cetirizine (ZYRTEC) 10 MG tablet; Take 1 tablet (10 mg total) by mouth daily.  Dispense: 90 tablet; Refill: 1   Follow Up Instructions: Return for Chronic medical conditions, keep previously scheduled appointment.    I discussed the assessment and treatment plan with the patient. The patient was provided an opportunity to ask questions and all were answered. The patient agreed with the plan and demonstrated an understanding of the instructions.   The patient was advised to call back or seek an in-person evaluation if the symptoms worsen or if the condition fails to improve as anticipated.     I provided 15 minutes total of non-face-to-face time during this encounter including median intraservice time, reviewing previous notes, labs, imaging, medications, management and patient verbalized understanding.     Charlott Rakes, MD, FAAFP. [facility] and [facility] [place], [place]   12/10/2018, 9:35 AM

## 2018-12-10 NOTE — Progress Notes (Signed)
Patient has been called and DOB has been verified. Patient has been screened and transferred to PCP to start phone visit.     

## 2018-12-20 DIAGNOSIS — K439 Ventral hernia without obstruction or gangrene: Secondary | ICD-10-CM | POA: Diagnosis not present

## 2018-12-20 DIAGNOSIS — B349 Viral infection, unspecified: Secondary | ICD-10-CM | POA: Diagnosis not present

## 2018-12-20 DIAGNOSIS — Z20828 Contact with and (suspected) exposure to other viral communicable diseases: Secondary | ICD-10-CM | POA: Diagnosis not present

## 2018-12-22 DIAGNOSIS — K429 Umbilical hernia without obstruction or gangrene: Secondary | ICD-10-CM | POA: Diagnosis not present

## 2018-12-22 DIAGNOSIS — K432 Incisional hernia without obstruction or gangrene: Secondary | ICD-10-CM | POA: Diagnosis not present

## 2018-12-23 ENCOUNTER — Ambulatory Visit: Payer: BC Managed Care – PPO | Admitting: Family Medicine

## 2018-12-24 ENCOUNTER — Encounter: Payer: Self-pay | Admitting: Family Medicine

## 2019-01-13 ENCOUNTER — Other Ambulatory Visit: Payer: Self-pay | Admitting: Family Medicine

## 2019-01-13 DIAGNOSIS — K219 Gastro-esophageal reflux disease without esophagitis: Secondary | ICD-10-CM

## 2019-01-15 DIAGNOSIS — Z48817 Encounter for surgical aftercare following surgery on the skin and subcutaneous tissue: Secondary | ICD-10-CM | POA: Diagnosis not present

## 2019-01-15 DIAGNOSIS — K439 Ventral hernia without obstruction or gangrene: Secondary | ICD-10-CM | POA: Diagnosis not present

## 2019-01-15 DIAGNOSIS — F17210 Nicotine dependence, cigarettes, uncomplicated: Secondary | ICD-10-CM | POA: Diagnosis not present

## 2019-01-16 ENCOUNTER — Encounter: Payer: Self-pay | Admitting: Family Medicine

## 2019-02-16 ENCOUNTER — Encounter: Payer: Self-pay | Admitting: Family Medicine

## 2019-02-16 ENCOUNTER — Other Ambulatory Visit: Payer: Self-pay

## 2019-02-16 ENCOUNTER — Ambulatory Visit: Payer: BC Managed Care – PPO | Attending: Family Medicine | Admitting: Family Medicine

## 2019-02-16 VITALS — BP 145/97 | HR 98 | Temp 98.0°F | Ht 67.0 in | Wt 222.0 lb

## 2019-02-16 DIAGNOSIS — R1084 Generalized abdominal pain: Secondary | ICD-10-CM

## 2019-02-16 DIAGNOSIS — F329 Major depressive disorder, single episode, unspecified: Secondary | ICD-10-CM

## 2019-02-16 DIAGNOSIS — M25551 Pain in right hip: Secondary | ICD-10-CM

## 2019-02-16 DIAGNOSIS — R29818 Other symptoms and signs involving the nervous system: Secondary | ICD-10-CM

## 2019-02-16 DIAGNOSIS — E7800 Pure hypercholesterolemia, unspecified: Secondary | ICD-10-CM

## 2019-02-16 DIAGNOSIS — M25552 Pain in left hip: Secondary | ICD-10-CM

## 2019-02-16 DIAGNOSIS — I10 Essential (primary) hypertension: Secondary | ICD-10-CM

## 2019-02-16 DIAGNOSIS — G8929 Other chronic pain: Secondary | ICD-10-CM

## 2019-02-16 DIAGNOSIS — F419 Anxiety disorder, unspecified: Secondary | ICD-10-CM

## 2019-02-16 DIAGNOSIS — M25562 Pain in left knee: Secondary | ICD-10-CM

## 2019-02-16 DIAGNOSIS — M545 Low back pain: Secondary | ICD-10-CM

## 2019-02-16 DIAGNOSIS — F32A Depression, unspecified: Secondary | ICD-10-CM

## 2019-02-16 DIAGNOSIS — M25561 Pain in right knee: Secondary | ICD-10-CM

## 2019-02-16 MED ORDER — DULOXETINE HCL 60 MG PO CPEP: 60.0000 mg | ORAL_CAPSULE | Freq: Every day | ORAL | 3 refills | Status: DC

## 2019-02-16 MED ORDER — ATORVASTATIN CALCIUM 20 MG PO TABS: 20.0000 mg | ORAL_TABLET | Freq: Every day | ORAL | 1 refills | Status: DC

## 2019-02-16 MED ORDER — HYDROCHLOROTHIAZIDE 25 MG PO TABS: 25.0000 mg | ORAL_TABLET | Freq: Every day | ORAL | 6 refills | Status: DC

## 2019-02-16 MED ORDER — BUSPIRONE HCL 15 MG PO TABS: 15.0000 mg | ORAL_TABLET | Freq: Two times a day (BID) | ORAL | 1 refills | Status: DC

## 2019-02-16 NOTE — Progress Notes (Signed)
Subjective:  Patient ID: Timothy Mccarthy, male    DOB: 10/30/1970  Age: 49 y.o. MRN: [id number]  CC: Hypertension   HPI Timothy Mccarthy  is a 49 year old male with a history of hypertension, GERD, anxiety, Osteoarthritis of the knee , recent laparoscopic umbilical hernia repair at [facility] here for a follow up visit. He is requesting a sleep study as he was noticed to stop breathing in his sleep, he endorses a history of snoring, falling asleep while watching TV. He has both fatigue and headaches.  He has been unable to return to work due to pain in his abdomen post surgery and his short term disability has ended. He has no income now and had to place his Mom in a home.This has him depressed (no SI). 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  Abdominal pain is 5/10 and increases with eating but pain is always present.he has no nausea or vomiting. He is sometimes constipated and he has to use laxatives Denies a family history of colon cancer. After his last visit with his surgeon one month ago he did not receive a follow up appointment. He would like to be referred to GI.  Past Medical History:  Diagnosis Date  . Adjustment disorder with anxious mood   . Anxiety   . Arthritis    "hands" (09/28/2015)  . Depression   . GERD (gastroesophageal reflux disease)   . Hypertension   . Tobacco use disorder     Past Surgical History:  Procedure Laterality Date  . APPENDECTOMY  09/15/2015  . CARPAL TUNNEL RELEASE Bilateral   . INCISIONAL HERNIA REPAIR N/A 09/29/2015   Procedure: HERNIA REPAIR INCISIONAL;  Surgeon: Erroll Luna, MD;  Location: [place];  Service: General;  Laterality: N/A;  . LAPAROSCOPIC APPENDECTOMY N/A 09/15/2015   Procedure: APPENDECTOMY LAPAROSCOPIC;  Surgeon: Judeth Horn, MD;  Location: [place];  Service:  General;  Laterality: N/A;  . LUMPS ON BACK  2013  . WISDOM TOOTH EXTRACTION      Family History  Problem Relation Age of Onset  . Stroke Mother   . Diabetes type II Mother     Allergies  Allergen Reactions  . Penicillins Anaphylaxis    Has patient had a PCN reaction causing immediate rash, facial/tongue/throat swelling, SOB or lightheadedness with hypotensionNO Has patient had a PCN reaction causing severe rash involving mucus membranes or skin necrosis: NO Has patient had a PCN reaction that required hospitalization NO Has patient had a PCN reaction occurring within the last 10 years: NO If all of the above answers are "NO", then may proceed with Cephalosporin use.    Outpatient Medications Prior to Visit  Medication Sig Dispense Refill  . atorvastatin (LIPITOR) 20 MG tablet Take 1 tablet (20 mg total) by mouth daily. 90 tablet 1  . busPIRone (BUSPAR) 15 MG tablet Take 1 tablet (15 mg total) by mouth 2 (two) times daily. 180 tablet 1  . cetirizine (ZYRTEC) 10 MG tablet Take 1 tablet (10 mg total) by mouth daily. 90 tablet 1  . fluticasone (FLONASE) 50 MCG/ACT nasal spray Place 2 sprays into both nostrils daily. 16 g 6  . hydrochlorothiazide (HYDRODIURIL) 25 MG tablet Take 1 tablet (25 mg total) by mouth daily. 30 tablet 6  . meloxicam (  MOBIC) 15 MG tablet Take 1 tablet (15 mg total) by mouth daily. 90 tablet 1  . Multiple Vitamin (MULTIVITAMIN WITH MINERALS) TABS tablet Take 2 tablets by mouth daily.     Marland Kitchen omeprazole (PRILOSEC) 40 MG capsule Take 1 capsule by mouth once daily 90 capsule 0  . acetaminophen-codeine (TYLENOL #3) 300-30 MG tablet Take 1 tablet by mouth every 12 (twelve) hours as needed for moderate pain. (Patient not taking: Reported on 12/10/2018) 30 tablet 0  . gabapentin (NEURONTIN) 300 MG capsule Take 1 capsule (300 mg total) by mouth at bedtime. (Patient not taking: Reported on 02/16/2019) 30 capsule 6  . HYDROcodone-acetaminophen (NORCO/VICODIN) 5-325 MG tablet  Take 1 tablet by mouth every 6 (six) hours as needed for severe pain. (Patient not taking: Reported on 12/10/2018) 8 tablet 0  . ondansetron (ZOFRAN ODT) 4 MG disintegrating tablet Take 1 tablet (4 mg total) by mouth every 8 (eight) hours as needed. (Patient not taking: Reported on 02/16/2019) 20 tablet 0  . ondansetron (ZOFRAN) 4 MG tablet Take 1 tablet (4 mg total) by mouth every 8 (eight) hours as needed for nausea or vomiting. (Patient not taking: Reported on 02/16/2019) 12 tablet 0  . potassium chloride SA (K-DUR) 20 MEQ tablet Take 1 tablet (20 mEq total) by mouth daily for 7 doses. 7 tablet 0   No facility-administered medications prior to visit.     ROS Review of Systems  Constitutional: Negative for activity change and appetite change.  HENT: Negative for sinus pressure and sore throat.   Eyes: Negative for visual disturbance.  Respiratory: Negative for cough, chest tightness and shortness of breath.   Cardiovascular: Negative for chest pain and leg swelling.  Gastrointestinal: Positive for abdominal pain and constipation. Negative for abdominal distention and diarrhea.  Endocrine: Negative.   Genitourinary: Negative for dysuria.  Musculoskeletal:       See HPI  Skin: Negative for rash.  Allergic/Immunologic: Negative.   Neurological: Negative for weakness, light-headedness and numbness.  Psychiatric/Behavioral: Negative for dysphoric mood and suicidal ideas.    Objective:  BP (!) 145/97   Pulse 98   Temp 98 F (36.7 C) (Oral)   Ht 5\' 7"  (1.702 m)   Wt 222 lb (100.7 kg)   SpO2 94%   BMI 34.77 kg/m   BP/Weight 02/16/2019 [id number]  Systolic BP [id number]  Diastolic BP 97 99 90  Wt. (Lbs) 222 - 220  BMI 34.77 - 34.46      Physical Exam Constitutional: normal appearing,  Eyes: PERRLA HEENT: Head is atraumatic, normal sinuses, normal oropharynx, normal appearing tonsils and palate, tympanic membrane is normal bilaterally. Neck: normal range of motion, no  thyromegaly, no JVD Cardiovascular: normal rate and rhythm, normal heart sounds, no murmurs, rub or gallop, no pedal edema Respiratory: Normal breath sounds, clear to auscultation bilaterally, no wheezes, no rales, no rhonchi Abdomen: Supraumbilical surgical scar, slightly tender Musculoskeletal: Tenderness on extension and flexion of lumbar spine. FROM in extremities Skin: warm and dry, no lesions. Neurological: alert, oriented x3, cranial nerves I-XII grossly intact , normal motor strength, normal sensation. Psychological: normal mood.   CMP Latest Ref Rng & Units 12/09/2018 10/18/2018 09/09/2018  Glucose 70 - 99 mg/dL 115(H) 99 104(H)  BUN 6 - 20 mg/dL 13 10 10   Creatinine 0.61 - 1.24 mg/dL 1.45(H) 1.16 1.22  Sodium 135 - 145 mmol/L 140 139 138  Potassium 3.5 - 5.1 mmol/L 3.6 3.0(L) 3.7  Chloride 98 - 111 mmol/L 101 102 101  CO2 22 - 32 mmol/L 27 27 26   Calcium 8.9 - 10.3 mg/dL 9.6 9.1 8.9  Total Protein 6.5 - 8.1 g/dL 7.1 6.6 7.8  Total Bilirubin 0.3 - 1.2 mg/dL 0.6 0.6 0.8  Alkaline Phos 38 - 126 U/L 79 89 85  AST 15 - 41 U/L 29 29 36  ALT 0 - 44 U/L 38 32 29    Lipid Panel     Component Value Date/Time   CHOL 104 11/26/2017 1211   TRIG 281 (H) 11/26/2017 1211   HDL 28 (L) 11/26/2017 1211   CHOLHDL 3.7 11/26/2017 1211   LDLCALC 20 11/26/2017 1211    CBC    Component Value Date/Time   WBC 10.8 (H) 12/09/2018 0205   RBC 5.32 12/09/2018 0205   HGB 14.7 12/09/2018 0205   HGB 14.8 10/25/2017 1115   HCT 45.3 12/09/2018 0205   HCT 46.0 10/25/2017 1115   PLT 270 12/09/2018 0205   PLT 259 10/25/2017 1115   MCV 85.2 12/09/2018 0205   MCV 84 10/25/2017 1115   MCH 27.6 12/09/2018 0205   MCHC 32.5 12/09/2018 0205   RDW 13.3 12/09/2018 0205   RDW 13.3 10/25/2017 1115   LYMPHSABS 3.0 10/18/2018 1953   MONOABS 0.8 10/18/2018 1953   EOSABS 0.2 10/18/2018 1953   BASOSABS 0.1 10/18/2018 1953    Lab Results  Component Value Date   HGBA1C 6.0 (H) 03/27/2017    Assessment  & Plan:    1. Essential hypertension Uncontrolled No regimen change as yet If still elevated after lifestyle modification, consider regimen adjustment at next visit Counseled on blood pressure goal of less than 130/80, low-sodium, DASH diet, medication compliance, 150 minutes of moderate intensity exercise per week. Discussed medication compliance, adverse effects. - hydrochlorothiazide (HYDRODIURIL) 25 MG tablet; Take 1 tablet (25 mg total) by mouth daily.  Dispense: 30 tablet; Refill: 6  2. Pure hypercholesterolemia Due for lipid panel Will order at next visit as he is not fasting now Counseled on low cholesterol diet - atorvastatin (LIPITOR) 20 MG tablet; Take 1 tablet (20 mg total) by mouth daily.  Dispense: 90 tablet; Refill: 1  3. Anxiety and depression Uncontrolled - PHQ 9=17 Trigerred by underlying financial and medical stressors Cymbalta added to regimen Schedule with LCSW for therapy - DULoxetine (CYMBALTA) 60 MG capsule; Take 1 capsule (60 mg total) by mouth daily.  Dispense: 30 capsule; Refill: 3 - busPIRone (BUSPAR) 15 MG tablet; Take 1 tablet (15 mg total) by mouth 2 (two) times daily.  Dispense: 180 tablet; Refill: 1  4. Suspected sleep apnea - Split night study; Future  5. Generalized abdominal pain Persisting post hernia repair Will need to avoid constipation - Ambulatory referral to Gastroenterology  6. Chronic arthralgias of knees and hips Uncontrolled on Mobic - Ambulatory referral to Physical Therapy  7. Chronic midline low back pain without sciatica See #6 above - Ambulatory referral to Physical Therapy    Return in about 3 months (around 05/17/2019) for [place] - elevated PHQ9 ;Chronic medical conditions-virtual.     Charlott Rakes, MD, FAAFP. [facility] and [place], [place]   02/16/2019, 3:38 PM

## 2019-02-16 NOTE — Patient Instructions (Signed)

## 2019-02-16 NOTE — Progress Notes (Signed)
Patient is requesting a sleep study.

## 2019-02-20 ENCOUNTER — Other Ambulatory Visit: Payer: Self-pay

## 2019-02-20 ENCOUNTER — Encounter: Payer: Self-pay | Admitting: Physical Therapy

## 2019-02-20 ENCOUNTER — Ambulatory Visit: Payer: BC Managed Care – PPO | Attending: Family Medicine | Admitting: Physical Therapy

## 2019-02-20 DIAGNOSIS — M25561 Pain in right knee: Secondary | ICD-10-CM | POA: Insufficient documentation

## 2019-02-20 DIAGNOSIS — R2689 Other abnormalities of gait and mobility: Secondary | ICD-10-CM

## 2019-02-20 DIAGNOSIS — M25562 Pain in left knee: Secondary | ICD-10-CM | POA: Insufficient documentation

## 2019-02-20 DIAGNOSIS — G8929 Other chronic pain: Secondary | ICD-10-CM | POA: Insufficient documentation

## 2019-02-20 DIAGNOSIS — M545 Low back pain: Secondary | ICD-10-CM | POA: Diagnosis present

## 2019-02-20 NOTE — Therapy (Signed)
[place], [place], [address] Phone: [phone]   Fax:  [phone]  Physical Therapy Evaluation  Patient Details  Name: Timothy Mccarthy MRN: [id number] Date of Birth: [birth year] Referring Provider (PT): Charlott Rakes, MD   Encounter Date: 02/20/2019  PT End of Session - 02/20/19 1235    Visit Number  1    Number of Visits  13    Date for PT Re-Evaluation  04/20/19    Authorization Type  BSBS    PT Start Time  1155    PT Stop Time  1235    PT Time Calculation (min)  40 min    Activity Tolerance  Patient tolerated treatment well    Behavior During Therapy  [facility] for tasks assessed/performed       Past Medical History:  Diagnosis Date  . Adjustment disorder with anxious mood   . Anxiety   . Arthritis    "hands" (09/28/2015)  . Depression   . GERD (gastroesophageal reflux disease)   . Hypertension   . Tobacco use disorder     Past Surgical History:  Procedure Laterality Date  . APPENDECTOMY  09/15/2015  . CARPAL TUNNEL RELEASE Bilateral   . INCISIONAL HERNIA REPAIR N/A 09/29/2015   Procedure: HERNIA REPAIR INCISIONAL;  Surgeon: Erroll Luna, MD;  Location: [place];  Service: General;  Laterality: N/A;  . LAPAROSCOPIC APPENDECTOMY N/A 09/15/2015   Procedure: APPENDECTOMY LAPAROSCOPIC;  Surgeon: Judeth Horn, MD;  Location: [place];  Service: General;  Laterality: N/A;  . LUMPS ON BACK  2013  . WISDOM TOOTH EXTRACTION      There were no vitals filed for this visit.   Subjective Assessment - 02/20/19 1150    Subjective  Pt arriving to therapy reporting s/p 3rd hernia surgery on 12/22/2018. Pt reporting bialteral knee pain with popping noted during walking. Pt also reporting low back pain of 4/10 at rest. Pt still reporting abdominal pain from his hernia surgery. Pt reporting knee pain of 5/10. Pt reported coming therapy in the past for knee pain and it helped until he started back to work. 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    Pertinent History  s/p 3 hernia surgeries( last one on 12/22/2018), bilateral carpal tunnel surgery, low back pain, slipped disc in low back, bilateral knee pain, Pt has been out of work since september.    Patient Stated Goals  Get back to work without pain    Currently in Pain?  Yes    Pain Score  5     Pain Location  Knee    Pain Orientation  Right;Left    Pain Descriptors / Indicators  Aching    Pain Type  Chronic pain    Pain Onset  More than a month ago    Pain Frequency  Intermittent    Aggravating Factors   walking, standing long periods    Pain Relieving Factors  lying down at night with pillows for support    Effect of Pain on Daily Activities  unable to work since September         [facility] PT Assessment - 02/20/19 0001      Assessment   Medical Diagnosis  bilateral knee pain, low back pain M54.5, M25.552, M25.551    Referring Provider (PT)  Charlott Rakes, MD    Onset Date/Surgical Date  12/22/18    Hand Dominance  Left    Prior Therapy  yes  Precautions   Precautions  None      Restrictions   Weight Bearing Restrictions  No      Balance Screen   Has the patient fallen in the past 6 months  Yes    How many times?  1    Has the patient had a decrease in activity level because of a fear of falling?   No    Is the patient reluctant to leave their home because of a fear of falling?   No      Home Film/video editor residence      Prior Function   Level of Independence  Independent    Vocation  Unemployed    Vocation Requirements  maintenace supervisor      Cognition   Overall Cognitive Status  Within Functional Limits for tasks assessed      Observation/Other Assessments   Focus on Therapeutic Outcomes (FOTO)   64% limitation      Posture/Postural Control   Posture/Postural Control  Postural limitations    Postural Limitations  Rounded Shoulders;Forward head;Increased thoracic kyphosis      ROM  / Strength   AROM / PROM / Strength  AROM;Strength      AROM   AROM Assessment Site  Lumbar    Lumbar Flexion  65    Lumbar Extension  10    Lumbar - Right Side Bend  28   pain in mid low back   Lumbar - Left Side Bend  34   pain in mid low back   Lumbar - Right Rotation  50% limitation    Lumbar - Left Rotation  75% limitation      Strength   Overall Strength  Deficits    Overall Strength Comments  bilateral LE's grossly 4-/5 in hips and knees, pain noted with knee flexion and end range of extension      Palpation   Palpation comment  TTP on lumbar paraspinals and Sacral lateral border      Special Tests    Special Tests  Lumbar    Lumbar Tests  Straight Leg Raise      Straight Leg Raise   Findings  Negative    Comment  bilateraly      Transfers   Five time sit to stand comments   32 seconds using UE support      Ambulation/Gait   Ambulation Distance (Feet)  50 Feet    Assistive device  None    Gait Pattern  Step-through pattern;Antalgic;Lateral hip instability;Trunk flexed;Poor foot clearance - left;Poor foot clearance - right      High Level Balance   High Level Balance Comments  when performing SLS bilaterally pt with trendelenburg hip drop, pt unable to hold 3 seconds on each LE                Objective measurements completed on examination: See above findings.              PT Education - 02/20/19 1233    Education Details  PT POC, HEP    Person(s) Educated  Patient    Methods  Explanation;Demonstration    Comprehension  Verbalized understanding;Returned demonstration       PT Short Term Goals - 02/20/19 1237      PT SHORT TERM GOAL #1   Title  PT will be Independent in his HEP    Time  3    Period  Weeks  Status  New    Target Date  03/13/19      PT SHORT TERM GOAL #2   Title  pt to verbalize and demo proper posture and gait mechanics to prevent and reduce bil knee pain    Time  3    Period  Weeks    Status  New    Target  Date  03/13/19        PT Long Term Goals - 02/20/19 1239      PT LONG TERM GOAL #1   Title  Pt will improve bilateral knee strength to >/= 4+/5 to improve walking and functional mobility.    Baseline  4-/5 in bilateral quads and hamstrings    Time  6    Period  Weeks    Status  New    Target Date  04/03/19      PT LONG TERM GOAL #2   Title  Pt able to pick up </= 20 pounds from the floor and return to standing using correct body mechanics.    Baseline  increased pain, pt reporting unable to lift due to pain in his bank and knees    Time  6    Period  Weeks    Status  New    Target Date  04/03/19      PT LONG TERM GOAL #3   Title  increase FOTO score to </= 44 % limited to demo improvement in function    Baseline  64% limitation on 02/20/2019    Time  6    Period  Weeks    Status  New    Target Date  04/03/19      PT LONG TERM GOAL #4   Title  Pt able to walk for >/= 30 minutes with low back pain and bilateral knee pain </=3/10.    Baseline  pain can increase to 7-8/10    Time  6    Period  Weeks    Status  New    Target Date  04/03/19      PT LONG TERM GOAL #5   Title  -             Plan - 02/20/19 1224    Clinical Impression Statement  Pt presenting with chronic history of bilaeral knee pain and low back pain. Pt s/p his 3rd hernia surgery on 12/22/2018. Pt reporting that he has been out of work since September. Pt presenting with decresed ROM in his lumbar spine and weakness noted in his core which is limited by pain in his abdomen from surgeries. Pt also with grossly 4-/5 LE strength bilaterally with pain reported with knee flexion and at end range of extension. Skilled PT needed to progress pt toward his PLOF with the below interventions.    Personal Factors and Comorbidities  Comorbidity 3+    Comorbidities  arthritis, HTN, s/p 3 hernia surgeries, bulging disc in his low back per pt report, chronic knee pain and low back pain    Examination-Activity Limitations   Carry;Stand;Stairs;Squat;Sit;Reach Overhead;Lift;Dressing    Examination-Participation Restrictions  Community Activity;Other    Stability/Clinical Decision Making  Evolving/Moderate complexity    Clinical Decision Making  Moderate    Rehab Potential  Good    PT Frequency  2x / week    PT Duration  6 weeks    PT Treatment/Interventions  ADLs/Self Care Home Management;Cryotherapy;Electrical Stimulation;Iontophoresis 4mg /ml Dexamethasone;Moist Heat;Gait training;Stair training;Functional mobility training;Therapeutic activities;Neuromuscular re-education;Balance training;Therapeutic exercise;Patient/family education;Scar mobilization;Manual techniques;Dry needling;Taping  PT Next Visit Plan  Nustep, lumbar streching, core strengthening, quad and hamstring strengthening, STM to lumbar paraspinals, modalities as needed    PT Home Exercise Plan  Access Code: [place] (hamstring stretches, supine marching, trunk rotation)    Consulted and Agree with Plan of Care  Patient       Patient will benefit from skilled therapeutic intervention in order to improve the following deficits and impairments:  Pain, Postural dysfunction, Decreased strength, Decreased activity tolerance, Decreased range of motion, Decreased mobility, Difficulty walking, Increased muscle spasms  Visit Diagnosis: Chronic pain of right knee  Chronic pain of left knee  Other abnormalities of gait and mobility  Chronic bilateral low back pain without sciatica     Problem List Patient Active Problem List   Diagnosis Date Noted  . Chronic pain of both knees 11/26/2017  . Hyperlipidemia 12/25/2016  . Anxiety 06/06/2016  . Essential hypertension 11/04/2015  . Abdominal hernia 09/28/2015  . Appendicitis 09/15/2015  . Tobacco use disorder 04/21/2015  . Adjustment disorder with anxious mood 04/21/2015  . Health care maintenance 04/21/2015  . GERD (gastroesophageal reflux disease) 04/21/2015    Oretha Caprice,  PT 02/20/2019, 12:57 PM  [facility] [address] [place], [place], [address] Phone: [phone]   Fax:  [phone]  Name: HARINDER LOPEMAN MRN: [id number] Date of Birth: [birth year]

## 2019-02-23 ENCOUNTER — Ambulatory Visit (INDEPENDENT_AMBULATORY_CARE_PROVIDER_SITE_OTHER): Payer: BC Managed Care – PPO | Admitting: Physician Assistant

## 2019-02-23 ENCOUNTER — Encounter: Payer: Self-pay | Admitting: Physician Assistant

## 2019-02-23 VITALS — BP 142/80 | HR 74 | Temp 98.7°F | Ht 67.0 in | Wt 220.0 lb

## 2019-02-23 DIAGNOSIS — R1084 Generalized abdominal pain: Secondary | ICD-10-CM | POA: Diagnosis not present

## 2019-02-23 DIAGNOSIS — R112 Nausea with vomiting, unspecified: Secondary | ICD-10-CM

## 2019-02-23 DIAGNOSIS — Z1211 Encounter for screening for malignant neoplasm of colon: Secondary | ICD-10-CM

## 2019-02-23 DIAGNOSIS — K219 Gastro-esophageal reflux disease without esophagitis: Secondary | ICD-10-CM

## 2019-02-23 DIAGNOSIS — K5900 Constipation, unspecified: Secondary | ICD-10-CM | POA: Diagnosis not present

## 2019-02-23 DIAGNOSIS — Z01818 Encounter for other preprocedural examination: Secondary | ICD-10-CM

## 2019-02-23 DIAGNOSIS — R1314 Dysphagia, pharyngoesophageal phase: Secondary | ICD-10-CM

## 2019-02-23 MED ORDER — PANTOPRAZOLE SODIUM 40 MG PO TBEC: 40.0000 mg | DELAYED_RELEASE_TABLET | Freq: Two times a day (BID) | ORAL | 3 refills | Status: DC

## 2019-02-23 MED ORDER — NA SULFATE-K SULFATE-MG SULF 17.5-3.13-1.6 GM/177ML PO SOLN: 1.0000 | Freq: Once | ORAL | 0 refills | Status: AC

## 2019-02-23 NOTE — Patient Instructions (Addendum)
If you are age 48 or older, your body mass index should be between 23-30. Your Body mass index is 34.46 kg/m. If this is out of the aforementioned range listed, please consider follow up with your Primary Care Provider.  If you are age 96 or younger, your body mass index should be between 19-25. Your Body mass index is 34.46 kg/m. If this is out of the aformentioned range listed, please consider follow up with your Primary Care Provider.   You have been scheduled for an endoscopy and colonoscopy. Please follow the written instructions given to you at your visit today. Please pick up your prep supplies at the pharmacy within the next 1-3 days. If you use inhalers (even only as needed), please bring them with you on the day of your procedure.  We have sent the following medications to your pharmacy for you to pick up at your convenience: Pantoprazole 40 mg twice daily.  Start Miralax daily Stop Omeprazole.

## 2019-02-23 NOTE — Progress Notes (Signed)
Chief Complaint: Abdominal pain  HPI:    Timothy Mccarthy is a 49 year old African-American male with a past medical history as listed below, who was referred to me by Charlott Rakes, MD for a complaint of abdominal pain.      09/10/2018 CT of the abdomen pelvis with contrast done for ventral hernia showed a small supraumbilical ventral hernia containing fat.    12/22/2018 patient had for scopic repair of ventral hernia with mesh placement.    02/16/2019 patient saw PCP and at that time described that he had abdominal pain since his hernia surgery.  He describes as being a 5/10 increasing with eating but always present.  Also described some constipation and laxative use.  He was referred to our clinic.    Today, the patient presents clinic and explains that in November he had his third surgery to repair a ventral hernia.  Explains that ever since the first surgery he has had an increase in abdominal pain around this area.  Pain is rated as a 5/10 which increases with eating.  Along with this describes constipation having a bowel movement maybe once a week which is very hard to get out.  He has tried Dulcolax which does not really help him much.  Associated symptoms include bloating.    Also explains that when he eats sometimes he gets nausea and sometimes vomits.  It also feels like it is hard for the food to go down and then it gets stuck right before it gets to his stomach especially things such as "fried chicken".  Also describes some heartburn.  Has been on Omeprazole 40 mg daily for a long time and does not feel as though it is helping anymore.    Denies fever, chills, blood in his stool or symptoms that awaken him from sleep.  Past Medical History:  Diagnosis Date  . Adjustment disorder with anxious mood   . Anxiety   . Arthritis    "hands" (09/28/2015)  . Depression   . GERD (gastroesophageal reflux disease)   . Hypertension   . Tobacco use disorder     Past Surgical History:  Procedure  Laterality Date  . APPENDECTOMY  09/15/2015  . CARPAL TUNNEL RELEASE Bilateral   . INCISIONAL HERNIA REPAIR N/A 09/29/2015   Procedure: HERNIA REPAIR INCISIONAL;  Surgeon: Erroll Luna, MD;  Location: [facility];  Service: General;  Laterality: N/A;  . LAPAROSCOPIC APPENDECTOMY N/A 09/15/2015   Procedure: APPENDECTOMY LAPAROSCOPIC;  Surgeon: Judeth Horn, MD;  Location: [place];  Service: General;  Laterality: N/A;  . LUMPS ON BACK  2013  . WISDOM TOOTH EXTRACTION      Current Outpatient Medications  Medication Sig Dispense Refill  . acetaminophen-codeine (TYLENOL #3) 300-30 MG tablet Take 1 tablet by mouth every 12 (twelve) hours as needed for moderate pain. (Patient not taking: Reported on 12/10/2018) 30 tablet 0  . atorvastatin (LIPITOR) 20 MG tablet Take 1 tablet (20 mg total) by mouth daily. 90 tablet 1  . busPIRone (BUSPAR) 15 MG tablet Take 1 tablet (15 mg total) by mouth 2 (two) times daily. 180 tablet 1  . cetirizine (ZYRTEC) 10 MG tablet Take 1 tablet (10 mg total) by mouth daily. 90 tablet 1  . DULoxetine (CYMBALTA) 60 MG capsule Take 1 capsule (60 mg total) by mouth daily. 30 capsule 3  . fluticasone (FLONASE) 50 MCG/ACT nasal spray Place 2 sprays into both nostrils daily. 16 g 6  . gabapentin (NEURONTIN) 300 MG capsule Take 1 capsule (  300 mg total) by mouth at bedtime. (Patient not taking: Reported on 02/16/2019) 30 capsule 6  . hydrochlorothiazide (HYDRODIURIL) 25 MG tablet Take 1 tablet (25 mg total) by mouth daily. 30 tablet 6  . HYDROcodone-acetaminophen (NORCO/VICODIN) 5-325 MG tablet Take 1 tablet by mouth every 6 (six) hours as needed for severe pain. (Patient not taking: Reported on 12/10/2018) 8 tablet 0  . meloxicam (MOBIC) 15 MG tablet Take 1 tablet (15 mg total) by mouth daily. 90 tablet 1  . Multiple Vitamin (MULTIVITAMIN WITH MINERALS) TABS tablet Take 2 tablets by mouth daily.     Marland Kitchen omeprazole (PRILOSEC) 40 MG capsule Take 1 capsule by mouth once daily 90 capsule 0  .  ondansetron (ZOFRAN ODT) 4 MG disintegrating tablet Take 1 tablet (4 mg total) by mouth every 8 (eight) hours as needed. (Patient not taking: Reported on 02/16/2019) 20 tablet 0  . ondansetron (ZOFRAN) 4 MG tablet Take 1 tablet (4 mg total) by mouth every 8 (eight) hours as needed for nausea or vomiting. (Patient not taking: Reported on 02/16/2019) 12 tablet 0  . potassium chloride SA (K-DUR) 20 MEQ tablet Take 1 tablet (20 mEq total) by mouth daily for 7 doses. 7 tablet 0   No current facility-administered medications for this visit.    Allergies as of 02/23/2019 - Review Complete 02/20/2019  Allergen Reaction Noted  . Penicillins Anaphylaxis 07/09/2012    Family History  Problem Relation Age of Onset  . Stroke Mother   . Diabetes type II Mother     Social History   Socioeconomic History  . Marital status: Significant Other    Spouse name: Not on file  . Number of children: Not on file  . Years of education: Not on file  . Highest education level: Not on file  Occupational History  . Not on file  Tobacco Use  . Smoking status: Current Every Day Smoker    Packs/day: 0.50    Years: 23.00    Pack years: 11.50    Types: Cigarettes    Start date: 04/21/1987  . Smokeless tobacco: Never Used  . Tobacco comment: Patches   Substance and Sexual Activity  . Alcohol use: Yes    Alcohol/week: 4.0 standard drinks    Types: 2 Cans of beer, 2 Shots of liquor per week    Comment: "takes shots whenever has pain"  . Drug use: No  . Sexual activity: Yes  Other Topics Concern  . 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   Social Determinants of Health   Financial Resource Strain:   . Difficulty of Paying Living Expenses: Not on file  Food Insecurity:   . Worried About Charity fundraiser in the Last Year: Not on file  . Ran Out of Food in the Last Year: Not on file  Transportation Needs:   .  Lack of Transportation (Medical): Not on file  . Lack of Transportation (Non-Medical): Not on file  Physical Activity:   . Days of Exercise per Week: Not on file  . Minutes of Exercise per Session: Not on file  Stress:   . Feeling of Stress : Not on file  Social Connections:   . Frequency of Communication with Friends and Family: Not on file  . Frequency of Social Gatherings with Friends and Family: Not on file  . Attends Religious Services: Not on file  .  Active Member of Clubs or Organizations: Not on file  . Attends Archivist Meetings: Not on file  . Marital Status: Not on file  Intimate Partner Violence:   . Fear of Current or Ex-Partner: Not on file  . Emotionally Abused: Not on file  . Physically Abused: Not on file  . Sexually Abused: Not on file    Review of Systems:    Constitutional: No weight loss, fever or chills Skin: No rash Cardiovascular: No chest pain   Respiratory: No SOB  Gastrointestinal: See HPI and otherwise negative Genitourinary: No dysuria  Neurological: No headache, dizziness or syncope Musculoskeletal: No new muscle or joint pain Hematologic: No bleeding  Psychiatric: No history of depression or anxiety   Physical Exam:  Vital signs: BP (!) 142/80   Pulse 74   Temp 98.7 F (37.1 C)   Ht 5\' 7"  (1.702 m)   Wt 220 lb (99.8 kg)   SpO2 98%   BMI 34.46 kg/m   Constitutional:   Pleasant AA male appears to be in NAD, Well developed, Well nourished, alert and cooperative Head:  Normocephalic and atraumatic. Eyes:   PEERL, EOMI. No icterus. Conjunctiva pink. Ears:  Normal auditory acuity. Neck:  Supple Throat: Oral cavity and pharynx without inflammation, swelling or lesion.  Respiratory: Respirations even and unlabored. Lungs clear to auscultation bilaterally.   No wheezes, crackles, or rhonchi.  Cardiovascular: Normal S1, S2. No MRG. Regular rate and rhythm. No peripheral edema, cyanosis or pallor.  Gastrointestinal:  Soft,  nondistended, Moderate ttp to even light palpation around umbilicus and mild in epigastrum, No rebound or guarding. Normal bowel sounds. No appreciable masses or hepatomegaly. Rectal:  Not performed.  Msk:  Symmetrical without gross deformities. Without edema, no deformity or joint abnormality.  Neurologic:  Alert and  oriented x4;  grossly normal neurologically.  Skin:   Dry and intact without significant lesions or rashes. Psychiatric: Demonstrates good judgement and reason without abnormal affect or behaviors.  MOST RECENT LABS AND IMAGING: CBC    Component Value Date/Time   WBC 10.8 (H) 12/09/2018 0205   RBC 5.32 12/09/2018 0205   HGB 14.7 12/09/2018 0205   HGB 14.8 10/25/2017 1115   HCT 45.3 12/09/2018 0205   HCT 46.0 10/25/2017 1115   PLT 270 12/09/2018 0205   PLT 259 10/25/2017 1115   MCV 85.2 12/09/2018 0205   MCV 84 10/25/2017 1115   MCH 27.6 12/09/2018 0205   MCHC 32.5 12/09/2018 0205   RDW 13.3 12/09/2018 0205   RDW 13.3 10/25/2017 1115   LYMPHSABS 3.0 10/18/2018 1953   MONOABS 0.8 10/18/2018 1953   EOSABS 0.2 10/18/2018 1953   BASOSABS 0.1 10/18/2018 1953    CMP     Component Value Date/Time   NA 140 12/09/2018 0205   NA 141 11/26/2017 1211   K 3.6 12/09/2018 0205   CL 101 12/09/2018 0205   CO2 27 12/09/2018 0205   GLUCOSE 115 (H) 12/09/2018 0205   BUN 13 12/09/2018 0205   BUN 9 11/26/2017 1211   CREATININE 1.45 (H) 12/09/2018 0205   CREATININE 1.36 (H) 03/14/2016 1532   CALCIUM 9.6 12/09/2018 0205   PROT 7.1 12/09/2018 0205   PROT 6.7 11/26/2017 1211   ALBUMIN 4.1 12/09/2018 0205   ALBUMIN 4.2 11/26/2017 1211   AST 29 12/09/2018 0205   ALT 38 12/09/2018 0205   ALKPHOS 79 12/09/2018 0205   BILITOT 0.6 12/09/2018 0205   BILITOT 0.3 11/26/2017 1211   GFRNONAA 57 (L)  12/09/2018 0205   GFRAA >60 12/09/2018 0205    Assessment: 1.  Epigastric pain: Likely related to heartburn/gastritis  +/- adhesions from ventral surgery 2.  Constipation: Question if  initially came from pain medications after surgery 3.  Nausea and vomiting: Occasionally per the patient; consider gastritis +/-H. pylori versus other 4.  Heartburn 5.  Screening for colorectal cancer: Patient is now 49 and never had a screening colonoscopy 6. Dysphagia: consider stricture vs dysmotility vs other  Plan: 1. Scheduled patient for a diagnostic EGD for dysphagia and heartburn and screening colonoscopy in the [facility] with Dr. Tarri Glenn.  I did discuss risks, benefits, limitations and alternatives and the patient agrees to proceed.  He will be Covid tested 2 days prior to time of procedure. 2.  Stop Omeprazole.  Started Pantoprazole 40 mg twice daily, 30 to 60 minutes before breakfast and dinner.  Prescribed #60 with 3 refills. 3.  Reviewed antireflux diet and lifestyle modifications. 4.  Recommend the patient start MiraLAX twice daily.  Discussed titration of this to a soft solid stool per day.  He can use this up to 4 times a day if necessary. 5.  Patient to follow in clinic per recommendations from Dr. Tarri Glenn after time of procedures.  Ellouise Newer, PA-C [facility] 02/23/2019, 11:51 AM  Cc: Charlott Rakes, MD

## 2019-02-23 NOTE — Progress Notes (Signed)
Reviewed and agree with management plans. ? ?Nason Conradt L. Kyley Solow, MD, MPH  ?

## 2019-02-24 ENCOUNTER — Ambulatory Visit (INDEPENDENT_AMBULATORY_CARE_PROVIDER_SITE_OTHER): Payer: BC Managed Care – PPO

## 2019-02-24 DIAGNOSIS — Z1159 Encounter for screening for other viral diseases: Secondary | ICD-10-CM

## 2019-02-25 LAB — SARS CORONAVIRUS 2 (TAT 6-24 HRS): SARS Coronavirus 2: NEGATIVE

## 2019-02-26 ENCOUNTER — Encounter: Payer: Self-pay | Admitting: Gastroenterology

## 2019-02-26 ENCOUNTER — Other Ambulatory Visit: Payer: Self-pay

## 2019-02-26 ENCOUNTER — Ambulatory Visit (AMBULATORY_SURGERY_CENTER): Payer: BC Managed Care – PPO | Admitting: Gastroenterology

## 2019-02-26 VITALS — BP 125/81 | HR 88 | Temp 98.9°F | Resp 18 | Ht 67.0 in | Wt 220.0 lb

## 2019-02-26 DIAGNOSIS — D127 Benign neoplasm of rectosigmoid junction: Secondary | ICD-10-CM | POA: Diagnosis not present

## 2019-02-26 DIAGNOSIS — D123 Benign neoplasm of transverse colon: Secondary | ICD-10-CM | POA: Diagnosis not present

## 2019-02-26 DIAGNOSIS — D124 Benign neoplasm of descending colon: Secondary | ICD-10-CM | POA: Diagnosis not present

## 2019-02-26 DIAGNOSIS — R1084 Generalized abdominal pain: Secondary | ICD-10-CM

## 2019-02-26 DIAGNOSIS — Z1211 Encounter for screening for malignant neoplasm of colon: Secondary | ICD-10-CM

## 2019-02-26 DIAGNOSIS — K2950 Unspecified chronic gastritis without bleeding: Secondary | ICD-10-CM

## 2019-02-26 DIAGNOSIS — D129 Benign neoplasm of anus and anal canal: Secondary | ICD-10-CM

## 2019-02-26 DIAGNOSIS — K208 Other esophagitis without bleeding: Secondary | ICD-10-CM | POA: Diagnosis not present

## 2019-02-26 DIAGNOSIS — D128 Benign neoplasm of rectum: Secondary | ICD-10-CM | POA: Diagnosis not present

## 2019-02-26 DIAGNOSIS — R1310 Dysphagia, unspecified: Secondary | ICD-10-CM

## 2019-02-26 DIAGNOSIS — K449 Diaphragmatic hernia without obstruction or gangrene: Secondary | ICD-10-CM | POA: Diagnosis not present

## 2019-02-26 DIAGNOSIS — D125 Benign neoplasm of sigmoid colon: Secondary | ICD-10-CM

## 2019-02-26 HISTORY — PX: COLONOSCOPY: SHX174

## 2019-02-26 HISTORY — PX: UPPER GI ENDOSCOPY: SHX6162

## 2019-02-26 MED ORDER — SODIUM CHLORIDE 0.9 % IV SOLN: 500.0000 mL | Freq: Once | INTRAVENOUS | Status: DC

## 2019-02-26 NOTE — Progress Notes (Signed)
Temp LC V/S CW 

## 2019-02-26 NOTE — Progress Notes (Signed)
Called to room to assist during endoscopic procedure.  Patient ID and intended procedure confirmed with present staff. Received instructions for my participation in the procedure from the performing physician.  

## 2019-02-26 NOTE — Patient Instructions (Signed)
Handouts on gastritis hiatal hernia, hemorrhoids, polyps, high fiber diet, and diverticulosis given to you today NO ASPIRIN, ASPIRIN CONTAINING PRODUCTS (BC OR GOODY POWDERS) OR NSAIDS (IBUPROFEN, ADVIL, ALEVE, AND MOTRIN); TYLENOL IS OK TO TAKE Follow high fiber diet  Await pathology results  YOU HAD AN ENDOSCOPIC PROCEDURE TODAY AT [facility]:   Refer to the procedure report that was given to you for any specific questions about what was found during the examination.  If the procedure report does not answer your questions, please call your gastroenterologist to clarify.  If you requested that your care partner not be given the details of your procedure findings, then the procedure report has been included in a sealed envelope for you to review at your convenience later.  YOU SHOULD EXPECT: Some feelings of bloating in the abdomen. Passage of more gas than usual.  Walking can help get rid of the air that was put into your GI tract during the procedure and reduce the bloating. If you had a lower endoscopy (such as a colonoscopy or flexible sigmoidoscopy) you may notice spotting of blood in your stool or on the toilet paper. If you underwent a bowel prep for your procedure, you may not have a normal bowel movement for a few days.  Please Note:  You might notice some irritation and congestion in your nose or some drainage.  This is from the oxygen used during your procedure.  There is no need for concern and it should clear up in a day or so.  SYMPTOMS TO REPORT IMMEDIATELY:   Following lower endoscopy (colonoscopy or flexible sigmoidoscopy):  Excessive amounts of blood in the stool  Significant tenderness or worsening of abdominal pains  Swelling of the abdomen that is new, acute  Fever of 100F or higher   Following upper endoscopy (EGD)  Vomiting of blood or coffee ground material  New chest pain or pain under the shoulder blades  Painful or persistently difficult  swallowing  New shortness of breath  Fever of 100F or higher  Black, tarry-looking stools  For urgent or emergent issues, a gastroenterologist can be reached at any hour by calling [phone].   DIET:  We do recommend a small meal at first, but then you may proceed to your regular diet.  Drink plenty of fluids but you should avoid alcoholic beverages for 24 hours.  ACTIVITY:  You should plan to take it easy for the rest of today and you should NOT DRIVE or use heavy machinery until tomorrow (because of the sedation medicines used during the test).    FOLLOW UP: Our staff will call the number listed on your records 48-72 hours following your procedure to check on you and address any questions or concerns that you may have regarding the information given to you following your procedure. If we do not reach you, we will leave a message.  We will attempt to reach you two times.  During this call, we will ask if you have developed any symptoms of COVID 19. If you develop any symptoms (ie: fever, flu-like symptoms, shortness of breath, cough etc.) before then, please call [phone].  If you test positive for Covid 19 in the 2 weeks post procedure, please call and report this information to [place].    If any biopsies were taken you will be contacted by phone or by letter within the next 1-3 weeks.  Please call us at [phone] if you have not heard about the biopsies  in 3 weeks.    SIGNATURES/CONFIDENTIALITY: You and/or your care partner have signed paperwork which will be entered into your electronic medical record.  These signatures attest to the fact that that the information above on your After Visit Summary has been reviewed and is understood.  Full responsibility of the confidentiality of this discharge information lies with you and/or your care-partner.

## 2019-02-26 NOTE — Op Note (Addendum)
[facility] Patient Name: Timothy Mccarthy Procedure Date: 02/26/2019 2:55 PM MRN: [id number] Endoscopist: Thornton Park MD, MD Age: 49 Referring MD:  Date of Birth: 09-14-1970 Gender: Male Account #: [id number] Procedure:                Upper GI endoscopy Indications:              Epigastric abdominal pain, Dysphagia Medicines:                Monitored Anesthesia Care Procedure:                Pre-Anesthesia Assessment:                           - Prior to the procedure, a History and Physical                            was performed, and patient medications and                            allergies were reviewed. The patient's tolerance of                            previous anesthesia was also reviewed. The risks                            and benefits of the procedure and the sedation                            options and risks were discussed with the patient.                            All questions were answered, and informed consent                            was obtained. Prior Anticoagulants: The patient has                            taken no previous anticoagulant or antiplatelet                            agents. ASA Grade Assessment: II - A patient with                            mild systemic disease. After reviewing the risks                            and benefits, the patient was deemed in                            satisfactory condition to undergo the procedure.                           After obtaining informed consent, the endoscope was  passed under direct vision. Throughout the                            procedure, the patient's blood pressure, pulse, and                            oxygen saturations were monitored continuously. The                            Endoscope was introduced through the mouth, and                            advanced to the third part of duodenum. The upper                            GI endoscopy was  accomplished without difficulty.                            The patient tolerated the procedure well. Scope In: Scope Out: Findings:                 LA Grade A (one or more mucosal breaks less than 5                            mm, not extending between tops of 2 mucosal folds)                            esophagitis was found. Biopsies were taken from the                            proximal and mid esophagus as well as from the                            distal esophagus with a cold forceps for histology.                            Estimated blood loss was minimal.                           Diffuse mild inflammation characterized by                            erythema, friability and granularity was found in                            the gastric body. Biopsies were taken from the                            antrum, body, and fundus with a cold forceps for                            histology. Estimated blood loss was minimal.  The examined duodenum was normal.                           A small hiatal hernia is present. The exam was                            otherwise without abnormality. Complications:            No immediate complications. Estimated blood loss:                            Minimal. Estimated Blood Loss:     Estimated blood loss was minimal. Impression:               - LA Grade A reflux esophagitis. Biopsied.                           - Gastritis. Biopsied.                           - Small hiatal hernia.                           - Normal examined duodenum.                           - The examination was otherwise normal. Recommendation:           - Patient has a contact number available for                            emergencies. The signs and symptoms of potential                            delayed complications were discussed with the                            patient. Return to normal activities tomorrow.                            Written  discharge instructions were provided to the                            patient.                           - Resume previous diet.                           - Continue present medications including                            pantoprazole 40 mg BID.                           - No aspirin, ibuprofen, naproxen, or other  non-steroidal anti-inflammatory drugs.                           - Await pathology results. Thornton Park MD, MD 02/26/2019 3:39:02 PM This report has been signed electronically.

## 2019-02-26 NOTE — Op Note (Signed)
[facility] Patient Name: Timothy Mccarthy Procedure Date: 02/26/2019 2:55 PM MRN: [id number] Endoscopist: Thornton Park MD, MD Age: 49 Referring MD:  Date of Birth: 06-Jun-1970 Gender: Male Account #: [id number] Procedure:                Colonoscopy Indications:              Screening for colorectal malignant neoplasm, This                            is the patient's first colonoscopy                           No known family history of colon cancer or polyps Medicines:                Monitored Anesthesia Care Procedure:                Pre-Anesthesia Assessment:                           - Prior to the procedure, a History and Physical                            was performed, and patient medications and                            allergies were reviewed. The patient's tolerance of                            previous anesthesia was also reviewed. The risks                            and benefits of the procedure and the sedation                            options and risks were discussed with the patient.                            All questions were answered, and informed consent                            was obtained. Prior Anticoagulants: The patient has                            taken no previous anticoagulant or antiplatelet                            agents. ASA Grade Assessment: II - A patient with                            mild systemic disease. After reviewing the risks                            and benefits, the patient was deemed in  satisfactory condition to undergo the procedure.                           After obtaining informed consent, the colonoscope                            was passed under direct vision. Throughout the                            procedure, the patient's blood pressure, pulse, and                            oxygen saturations were monitored continuously. The                            Colonoscope was  introduced through the anus and                            advanced to the the cecum, identified by                            appendiceal orifice and ileocecal valve. A second                            forward view of the right colon was performed. The                            patient tolerated the procedure well. The quality                            of the bowel preparation was good. The colonoscopy                            was performed without difficulty. The ileocecal                            valve, appendiceal orifice, and rectum were                            photographed. Scope In: 3:11:42 PM Scope Out: 3:26:48 PM Scope Withdrawal Time: 0 hours 12 minutes 27 seconds  Total Procedure Duration: 0 hours 15 minutes 6 seconds  Findings:                 The perianal and digital rectal examinations were                            normal.                           Three sessile polyps were found in the rectum,                            distal sigmoid colon and hepatic flexure. The  polyps were 1 to 3 mm in size. These polyps were                            removed with a cold snare. Resection and retrieval                            were complete. Estimated blood loss was minimal.                           A few small-mouthed diverticula were found in the                            sigmoid colon, descending colon and ascending colon.                           Non-bleeding internal hemorrhoids were found.                           The exam was otherwise without abnormality on                            direct and retroflexion views. Complications:            No immediate complications. Estimated blood loss:                            Minimal. Estimated Blood Loss:     Estimated blood loss was minimal. Impression:               - Three 1 to 3 mm polyps in the rectum, in the                            distal sigmoid colon and at the hepatic flexure,                             removed with a cold snare. Resected and retrieved.                           - Diverticulosis in the sigmoid colon, in the                            descending colon and in the ascending colon.                           - Non-bleeding internal hemorrhoids.                           - The examination was otherwise normal on direct                            and retroflexion views. Recommendation:           - Patient has a contact number available for  emergencies. The signs and symptoms of potential                            delayed complications were discussed with the                            patient. Return to normal activities tomorrow.                            Written discharge instructions were provided to the                            patient.                           - High fiber diet.                           - Continue present medications.                           - Await pathology results.                           - Repeat colonoscopy date to be determined after                            pending pathology results are reviewed for                            surveillance. Thornton Park MD, MD 02/26/2019 3:46:18 PM This report has been signed electronically.

## 2019-02-26 NOTE — Progress Notes (Signed)
Report given to PACU, vss 

## 2019-03-02 ENCOUNTER — Ambulatory Visit: Payer: BC Managed Care – PPO | Admitting: Physical Therapy

## 2019-03-02 ENCOUNTER — Telehealth: Payer: Self-pay

## 2019-03-02 NOTE — Telephone Encounter (Signed)
Left message on follow up call. 

## 2019-03-02 NOTE — Telephone Encounter (Signed)
Patient is returning your call.  

## 2019-03-03 ENCOUNTER — Telehealth: Payer: Self-pay

## 2019-03-03 DIAGNOSIS — R29818 Other symptoms and signs involving the nervous system: Secondary | ICD-10-CM

## 2019-03-03 NOTE — Telephone Encounter (Signed)
[facility] called and states that due to patient insurance he will need an in home sleep study.  He will be scheduled by the sleep center once the order has been placed.

## 2019-03-04 ENCOUNTER — Encounter: Payer: Self-pay | Admitting: Gastroenterology

## 2019-03-04 ENCOUNTER — Other Ambulatory Visit: Payer: Self-pay

## 2019-03-04 ENCOUNTER — Ambulatory Visit: Payer: BC Managed Care – PPO | Admitting: Licensed Clinical Social Worker

## 2019-03-04 ENCOUNTER — Telehealth: Payer: Self-pay | Admitting: Licensed Clinical Social Worker

## 2019-03-04 NOTE — Telephone Encounter (Signed)
Order has been placed.

## 2019-03-04 NOTE — Telephone Encounter (Signed)
Call placed to patient regarding scheduled [facility] appointment. LCSW left message requesting a return call.  

## 2019-03-05 ENCOUNTER — Other Ambulatory Visit: Payer: Self-pay

## 2019-03-05 ENCOUNTER — Telehealth: Payer: Self-pay | Admitting: Physical Therapy

## 2019-03-05 ENCOUNTER — Ambulatory Visit: Payer: BC Managed Care – PPO | Attending: Family Medicine | Admitting: Licensed Clinical Social Worker

## 2019-03-05 ENCOUNTER — Ambulatory Visit: Payer: BC Managed Care – PPO | Admitting: Physical Therapy

## 2019-03-05 DIAGNOSIS — F331 Major depressive disorder, recurrent, moderate: Secondary | ICD-10-CM

## 2019-03-05 NOTE — Telephone Encounter (Signed)
Attempted to call patient regarding no show for therapy appointment today. Left voicemail with reminder of next appointment and request to contact clinic to inform if unable to attend.

## 2019-03-08 ENCOUNTER — Other Ambulatory Visit: Payer: Self-pay | Admitting: Family Medicine

## 2019-03-08 DIAGNOSIS — J3089 Other allergic rhinitis: Secondary | ICD-10-CM

## 2019-03-09 ENCOUNTER — Ambulatory Visit: Payer: BC Managed Care – PPO | Admitting: Physical Therapy

## 2019-03-09 ENCOUNTER — Telehealth: Payer: Self-pay | Admitting: Family Medicine

## 2019-03-09 ENCOUNTER — Telehealth: Payer: Self-pay | Admitting: Physical Therapy

## 2019-03-09 MED ORDER — FLUOXETINE HCL 20 MG PO TABS: 20.0000 mg | ORAL_TABLET | Freq: Every day | ORAL | 3 refills | Status: DC

## 2019-03-09 NOTE — Telephone Encounter (Signed)
Patient complains of side effects with Cymbalta.  We will switch to Prozac

## 2019-03-09 NOTE — Telephone Encounter (Signed)
Attempted to call about pt's No show Physical Therapy appointment today at 2:45pm. The phone rang with no answer.  I was unable to leave a voice mail.   Kearney Hard, PT 03/09/19 3:31 PM

## 2019-03-11 ENCOUNTER — Ambulatory Visit: Payer: BC Managed Care – PPO | Admitting: Physical Therapy

## 2019-03-11 NOTE — Telephone Encounter (Signed)
Pt called to follow up after he No showed his PT appointment today at 2:45. His mailbox was full and I was unable to leave a message.   Kearney Hard, PT 03/11/19 2:59 PM

## 2019-03-12 ENCOUNTER — Ambulatory Visit: Payer: BC Managed Care – PPO | Admitting: Physical Therapy

## 2019-03-12 ENCOUNTER — Telehealth: Payer: Self-pay | Admitting: Physical Therapy

## 2019-03-12 NOTE — Telephone Encounter (Signed)
Attempted to call patient regarding no show for 2:15 therapy appointment today. Informed of discharge from therapy per facility attendance policy with further visits cancelled due to no shows and that he would need a new referral from MD if wishing to return.

## 2019-03-13 NOTE — BH Specialist Note (Signed)
Central Visit via Telemedicine (Telephone)  03/05/2019 Timothy Mccarthy [id number]   Session Start time: 4:10 PM  Session End time: 4:40 PM Total time: 30  Referring Provider: Dr. Margarita Mccarthy Type of Visit: Telephonic Patient location: Home [facility] Provider location: Office All persons participating in visit: LCSW and Patient  Confirmed patient's address: Yes  Confirmed patient's phone number: Yes  Any changes to demographics: No   Confirmed patient's insurance: Yes  Any changes to patient's insurance: No   Discussed confidentiality: Yes    The following statements were read to the patient and/or legal guardian that are established with the [facility] Provider.  "The purpose of this phone visit is to provide behavioral health care while limiting exposure to the coronavirus (COVID19).  There is a possibility of technology failure and discussed alternative modes of communication if that failure occurs."  "By engaging in this telephone visit, you consent to the provision of healthcare.  Additionally, you authorize for your insurance to be billed for the services provided during this telephone visit."   Patient and/or legal guardian consented to telephone visit: Yes   PRESENTING CONCERNS: Patient and/or family reports the following symptoms/concerns: Pt reports increase in anxiety and depression triggered by psychosocial stressors. Pt's mother has been diagnosed with COVID19, pt is unemployed after sustaining an injury at work, and he recently ended relationship with girlfriend Duration of problem: Ongoing; Severity of problem: severe  STRENGTHS (Protective Factors/Coping Skills): Pt receives strong support from god parents  GOALS ADDRESSED: Patient will: 1.  Reduce symptoms of: anxiety, depression and stress  2.  Increase knowledge and/or ability of: coping skills and healthy habits  3.  Demonstrate ability to: Increase healthy adjustment to current life  circumstances and Increase adequate support systems for patient/family  INTERVENTIONS: Interventions utilized:  Solution-Focused Strategies, Supportive Counseling and Psychoeducation and/or Health Education Standardized Assessments completed: Not Needed  ASSESSMENT: Patient currently experiencing depression and anxiety triggered by psychosocial stressors, financial strain, and mother's declining health. Symptoms include feelings of sadness, worry, irritability, decreased sleep and appetite.   Patient may benefit from psychotherapy and medication management. Pt reports taking Cymbalta for approx a week before discontinuing medications due to headaches, blurred vision, and nausea. LCSW messaged PCP for advisement. Pt is interested in new prescription for anxiety and depression.   LCSW assisted pt in identifying healthy coping skills to manage symptoms. Pt identified protective factors (adult daughter and seven grandchildren) Pt was strongly encouraged to implement self-care activities, such as, fishing  PLAN: 1. Follow up with behavioral health clinician on : Schedule follow up appointment 2. Behavioral recommendations: Utilize strategies discussed and comply with medication management 3. Referral(s): [facility] [facility] (In Clinic)  Timothy Mccarthy, Timothy Mccarthy 03/13/2019 3:41 PM

## 2019-03-13 NOTE — Therapy (Signed)
[facility] [place], [place], [address] Phone: [phone]   Fax:  [phone]  Physical Therapy Evaluation/Discharge  Patient Details  Name: Timothy Mccarthy MRN: [id number] Date of Birth: Jun 03, 1970 Referring Provider (PT): Charlott Rakes, MD   Encounter Date: 02/20/2019    Past Medical History:  Diagnosis Date  . Adjustment disorder with anxious mood   . Allergy   . Anxiety   . Arthritis    "hands" (09/28/2015)  . Depression   . GERD (gastroesophageal reflux disease)   . Hyperlipidemia   . Hypertension   . Tobacco use disorder     Past Surgical History:  Procedure Laterality Date  . APPENDECTOMY  09/15/2015  . CARPAL TUNNEL RELEASE Bilateral   . INCISIONAL HERNIA REPAIR N/A 09/29/2015   Procedure: HERNIA REPAIR INCISIONAL;  Surgeon: Erroll Luna, MD;  Location: [facility];  Service: General;  Laterality: N/A;  . LAPAROSCOPIC APPENDECTOMY N/A 09/15/2015   Procedure: APPENDECTOMY LAPAROSCOPIC;  Surgeon: Judeth Horn, MD;  Location: [facility];  Service: General;  Laterality: N/A;  . LUMPS ON BACK  2013  . WISDOM TOOTH EXTRACTION      There were no vitals filed for this visit.                  Objective measurements completed on examination: See above findings.                PT Short Term Goals - 02/20/19 1237      PT SHORT TERM GOAL #1   Title  PT will be Independent in his HEP    Time  3    Period  Weeks    Status  New    Target Date  03/13/19      PT SHORT TERM GOAL #2   Title  pt to verbalize and demo proper posture and gait mechanics to prevent and reduce bil knee pain    Time  3    Period  Weeks    Status  New    Target Date  03/13/19        PT Long Term Goals - 02/20/19 1239      PT LONG TERM GOAL #1   Title  Pt will improve bilateral knee strength to >/= 4+/5 to improve walking and functional mobility.    Baseline  4-/5 in bilateral quads and hamstrings    Time   6    Period  Weeks    Status  New    Target Date  04/03/19      PT LONG TERM GOAL #2   Title  Pt able to pick up </= 20 pounds from the floor and return to standing using correct body mechanics.    Baseline  increased pain, pt reporting unable to lift due to pain in his bank and knees    Time  6    Period  Weeks    Status  New    Target Date  04/03/19      PT LONG TERM GOAL #3   Title  increase FOTO score to </= 44 % limited to demo improvement in function    Baseline  64% limitation on 02/20/2019    Time  6    Period  Weeks    Status  New    Target Date  04/03/19      PT LONG TERM GOAL #4   Title  Pt able to walk for >/= 30 minutes with low  back pain and bilateral knee pain </=3/10.    Baseline  pain can increase to 7-8/10    Time  6    Period  Weeks    Status  New    Target Date  04/03/19      PT LONG TERM GOAL #5   Title  -               Patient will benefit from skilled therapeutic intervention in order to improve the following deficits and impairments:  Pain, Postural dysfunction, Decreased strength, Decreased activity tolerance, Decreased range of motion, Decreased mobility, Difficulty walking, Increased muscle spasms  Visit Diagnosis: Chronic pain of right knee - Plan: PT plan of care cert/re-cert  Chronic pain of left knee - Plan: PT plan of care cert/re-cert  Other abnormalities of gait and mobility - Plan: PT plan of care cert/re-cert  Chronic bilateral low back pain without sciatica - Plan: PT plan of care cert/re-cert     Problem List Patient Active Problem List   Diagnosis Date Noted  . Chronic pain of both knees 11/26/2017  . Hyperlipidemia 12/25/2016  . Anxiety 06/06/2016  . Essential hypertension 11/04/2015  . Abdominal hernia 09/28/2015  . Appendicitis 09/15/2015  . Tobacco use disorder 04/21/2015  . Adjustment disorder with anxious mood 04/21/2015  . Health care maintenance 04/21/2015  . GERD (gastroesophageal reflux disease)  04/21/2015      PHYSICAL THERAPY DISCHARGE SUMMARY  Visits from Start of Care: 1  Current functional level related to goals / functional outcomes: Patient attended initial evaluation 02/20/19 but did not return for any follow up visits with 3 no shows for follow ups. Discharge per facility attendance policy. Recommend MD follow up if wishing to try and return to therapy.   Remaining deficits: Unknown-patient did not return   Education / Equipment: NA Plan: Patient agrees to discharge.  Patient goals were not met. Patient is being discharged due to not returning since the last visit.  ?????          Beaulah Dinning, PT, DPT 03/13/19 9:27 AM    [facility] [address] [place], [place], [address] Phone: [phone]   Fax:  [phone]  Name: DENTON DERKS MRN: [id number] Date of Birth: 02/10/1971

## 2019-03-16 ENCOUNTER — Ambulatory Visit: Payer: BC Managed Care – PPO | Admitting: Physical Therapy

## 2019-03-18 ENCOUNTER — Ambulatory Visit: Payer: BC Managed Care – PPO | Admitting: Physical Therapy

## 2019-03-23 ENCOUNTER — Other Ambulatory Visit: Payer: Self-pay | Admitting: Family Medicine

## 2019-03-23 ENCOUNTER — Encounter: Payer: Self-pay | Admitting: Family Medicine

## 2019-03-23 ENCOUNTER — Ambulatory Visit: Payer: BC Managed Care – PPO | Admitting: Physical Therapy

## 2019-03-23 MED ORDER — CLINDAMYCIN HCL 300 MG PO CAPS: 300.0000 mg | ORAL_CAPSULE | Freq: Two times a day (BID) | ORAL | 0 refills | Status: DC

## 2019-03-25 ENCOUNTER — Ambulatory Visit: Payer: BC Managed Care – PPO | Admitting: Physical Therapy

## 2019-04-13 ENCOUNTER — Encounter: Payer: Self-pay | Admitting: Family Medicine

## 2019-04-27 ENCOUNTER — Other Ambulatory Visit: Payer: Self-pay

## 2019-04-27 ENCOUNTER — Encounter: Payer: Self-pay | Admitting: Family Medicine

## 2019-04-27 ENCOUNTER — Ambulatory Visit: Payer: Self-pay | Attending: Family Medicine | Admitting: Family Medicine

## 2019-04-27 DIAGNOSIS — M62838 Other muscle spasm: Secondary | ICD-10-CM

## 2019-04-27 MED ORDER — METHOCARBAMOL 500 MG PO TABS: 500.0000 mg | ORAL_TABLET | Freq: Three times a day (TID) | ORAL | 1 refills | Status: DC | PRN

## 2019-04-27 NOTE — Progress Notes (Signed)
Virtual Visit via Telephone Note  I connected with Timothy Mccarthy, on 04/27/2019 at 2:17 PM by telephone due to the COVID-19 pandemic and verified that I am speaking with the correct person using two identifiers.   Consent: I discussed the limitations, risks, security and privacy concerns of performing an evaluation and management service by telephone and the availability of in person appointments. I also discussed with the patient that there may be a patient responsible charge related to this service. The patient expressed understanding and agreed to proceed.   Location of Patient: Environmental education officer of Provider: Clinic   Persons participating in Telemedicine visit: Lipa Glew Farrington-CMA Dr. Margarita Rana     History of Present Illness: Timothy Mccarthy  is a L handed 49 year old male with a history of hypertension, GERD, anxiety, Osteoarthritis of the knee , recent laparoscopic umbilical hernia repair at [facility] here for a follow up visit.  For the last one month his left arm has been hurting on the inner arm and when he stretches it he feels it is tight and he has difficulty wiping himself after using the bathroom He feels the tightness and pain up to his shoulder.  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 He currently has no medications for it.  He got in touch with his general surgeon at [facility] due to intermittent abdominal pain for his hernia surgery but had to be rescheduled.   Past Medical History:  Diagnosis Date  . Adjustment disorder with anxious mood   . Allergy   . Anxiety   . Arthritis    "hands" (09/28/2015)  . Depression   . GERD (gastroesophageal reflux disease)   . Hyperlipidemia   . Hypertension   . Tobacco use disorder    Allergies  Allergen Reactions  . Penicillins Anaphylaxis    Has patient had a PCN reaction causing immediate rash, facial/tongue/throat  swelling, SOB or lightheadedness with hypotensionNO Has patient had a PCN reaction causing severe rash involving mucus membranes or skin necrosis: NO Has patient had a PCN reaction that required hospitalization NO Has patient had a PCN reaction occurring within the last 10 years: NO If all of the above answers are "NO", then may proceed with Cephalosporin use.    Current Outpatient Medications on File Prior to Visit  Medication Sig Dispense Refill  . atorvastatin (LIPITOR) 20 MG tablet Take 1 tablet (20 mg total) by mouth daily. 90 tablet 1  . busPIRone (BUSPAR) 15 MG tablet Take 1 tablet (15 mg total) by mouth 2 (two) times daily. 180 tablet 1  . FLUoxetine (PROZAC) 20 MG tablet Take 1 tablet (20 mg total) by mouth daily. 30 tablet 3  . fluticasone (FLONASE) 50 MCG/ACT nasal spray USE 2 SPRAY(S) IN EACH NOSTRIL DAILY 16 g 2  . hydrochlorothiazide (HYDRODIURIL) 25 MG tablet Take 1 tablet (25 mg total) by mouth daily. 30 tablet 6  . meloxicam (MOBIC) 15 MG tablet Take 1 tablet (15 mg total) by mouth daily. 90 tablet 1  . Multiple Vitamin (MULTIVITAMIN WITH MINERALS) TABS tablet Take 2 tablets by mouth daily.     . ondansetron (ZOFRAN ODT) 4 MG disintegrating tablet Take 1 tablet (4 mg total) by mouth every 8 (eight) hours as needed. 20 tablet 0  . clindamycin (CLEOCIN) 300 MG capsule Take 1 capsule (300 mg total) by mouth 2 (two) times daily. (Patient not  taking: Reported on 04/27/2019) 20 capsule 0  . pantoprazole (PROTONIX) 40 MG tablet Take 1 tablet (40 mg total) by mouth 2 (two) times daily. (Patient not taking: Reported on 04/27/2019) 60 tablet 3   No current facility-administered medications on file prior to visit.    Observations/Objective: Awake, alert, oriented x3 Not in acute distress   Assessment and Plan: 1. Muscle spasm New diagnosis - no history of trauma Advised on home exercise regimen We will commence Robaxin Apply heat He is just starting a new job but will be getting  back in touch with me with regards to his schedule so he can be referred for physical therapy - methocarbamol (ROBAXIN) 500 MG tablet; Take 1 tablet (500 mg total) by mouth every 8 (eight) hours as needed for muscle spasms.  Dispense: 90 tablet; Refill: 1   Follow Up Instructions: Return for medical conditions, keep previously scheduled appointment.    I discussed the assessment and treatment plan with the patient. The patient was provided an opportunity to ask questions and all were answered. The patient agreed with the plan and demonstrated an understanding of the instructions.   The patient was advised to call back or seek an in-person evaluation if the symptoms worsen or if the condition fails to improve as anticipated.     I provided 13 minutes total of non-face-to-face time during this encounter including median intraservice time, reviewing previous notes, investigations, ordering medications, medical decision making, coordinating care and patient verbalized understanding at the end of the visit.     Charlott Rakes, MD, FAAFP. [facility] [place], [place]   04/27/2019, 2:17 PM

## 2019-04-27 NOTE — Progress Notes (Signed)
Patient has been called and DOB has been verified. Patient has been screened and transferred to PCP to start phone visit.  States that he may have pulled a muscle in his right arm.

## 2019-05-07 ENCOUNTER — Other Ambulatory Visit: Payer: Self-pay

## 2019-05-07 ENCOUNTER — Ambulatory Visit (HOSPITAL_BASED_OUTPATIENT_CLINIC_OR_DEPARTMENT_OTHER): Payer: Self-pay | Attending: Family Medicine | Admitting: Internal Medicine

## 2019-05-07 DIAGNOSIS — R29818 Other symptoms and signs involving the nervous system: Secondary | ICD-10-CM

## 2019-05-07 DIAGNOSIS — R0683 Snoring: Secondary | ICD-10-CM | POA: Insufficient documentation

## 2019-05-07 DIAGNOSIS — G4733 Obstructive sleep apnea (adult) (pediatric): Secondary | ICD-10-CM | POA: Insufficient documentation

## 2019-05-11 ENCOUNTER — Ambulatory Visit: Payer: BC Managed Care – PPO | Admitting: Family Medicine

## 2019-05-14 ENCOUNTER — Telehealth: Payer: Self-pay | Admitting: Family Medicine

## 2019-05-14 NOTE — Telephone Encounter (Signed)
Patient was called and he was informed that he needs to return the equipment, patient states that he will return the equipment today on his lunch break.

## 2019-05-14 NOTE — Telephone Encounter (Signed)
Patient will not return sleep center equipitment

## 2019-05-18 ENCOUNTER — Other Ambulatory Visit (HOSPITAL_BASED_OUTPATIENT_CLINIC_OR_DEPARTMENT_OTHER): Payer: Self-pay

## 2019-05-18 DIAGNOSIS — R29818 Other symptoms and signs involving the nervous system: Secondary | ICD-10-CM

## 2019-05-18 NOTE — Procedures (Signed)
    Patient Name: Timothy Mccarthy, Timothy Mccarthy Date: 05/08/2019 Gender: Male D.O.B: 06-01-1970 Age (years): 49 Referring Provider: Arnoldo Morale Height (inches): 67 Interpreting Physician: Baird Lyons MD, ABSM Weight (lbs): 222 RPSGT: Gerhard Perches BMI: 35 MRN: [id number] Neck Size: 17.50  CLINICAL INFORMATION Sleep Study Type: HST Indication for sleep study: OSA Epworth Sleepiness Score: 12  SLEEP STUDY TECHNIQUE A multi-channel overnight portable sleep study was performed. The channels recorded were: nasal airflow, thoracic respiratory movement, and oxygen saturation with a pulse oximetry. Snoring was also monitored.  MEDICATIONS Patient self administered medications include: none reported.  SLEEP ARCHITECTURE Patient was studied for 435 minutes. The sleep efficiency was 99.8 % and the patient was supine for 37.4%. The arousal index was 0.0 per hour.  RESPIRATORY PARAMETERS The overall AHI was 16.7 per hour, with a central apnea index of 0.0 per hour. The oxygen nadir was 79% during sleep.   CARDIAC DATA Mean heart rate during sleep was 117.8 bpm.  IMPRESSIONS - Moderate obstructive sleep apnea occurred during this study (AHI = 16.7/h). - No significant central sleep apnea occurred during this study (CAI = 0.0/h). - Oxygen desaturation was noted during this study (Min O2 = 79%). Mean sat 93%. - Patient snored.  DIAGNOSIS - Obstructive Sleep Apnea (327.23 [G47.33 ICD-10])  RECOMMENDATIONS - Suggest CPAP titration sleep study or autopap. Other options would be based on clinical judgment. - Be careful with alcohol, sedatives and other CNS depressants that may worsen sleep apnea and disrupt normal sleep architecture. - Sleep hygiene should be reviewed to assess factors that may improve sleep quality. - Weight management and regular exercise should be initiated or continued.  [Electronically signed] 05/18/2019 12:45 PM  Baird Lyons MD, [place], American Board  of Sleep Medicine   NPI: [id number]                        [place], [place] [facility]  ELECTRONICALLY SIGNED ON:  05/18/2019, 12:42 PM [place] PH: [phone]   FX: [phone] [place]

## 2019-05-20 ENCOUNTER — Other Ambulatory Visit: Payer: Self-pay | Admitting: Family Medicine

## 2019-05-20 DIAGNOSIS — G4733 Obstructive sleep apnea (adult) (pediatric): Secondary | ICD-10-CM

## 2019-05-29 ENCOUNTER — Encounter: Payer: Self-pay | Admitting: Family Medicine

## 2019-06-09 ENCOUNTER — Encounter (HOSPITAL_COMMUNITY): Payer: Self-pay

## 2019-06-09 ENCOUNTER — Other Ambulatory Visit: Payer: Self-pay

## 2019-06-09 ENCOUNTER — Ambulatory Visit (HOSPITAL_COMMUNITY)
Admission: EM | Admit: 2019-06-09 | Discharge: 2019-06-09 | Disposition: A | Discharge status: Home / Self Care | Payer: HRSA Program | Attending: Physician Assistant | Admitting: Physician Assistant

## 2019-06-09 DIAGNOSIS — M25519 Pain in unspecified shoulder: Secondary | ICD-10-CM | POA: Diagnosis not present

## 2019-06-09 DIAGNOSIS — M25562 Pain in left knee: Secondary | ICD-10-CM | POA: Diagnosis not present

## 2019-06-09 DIAGNOSIS — F17210 Nicotine dependence, cigarettes, uncomplicated: Secondary | ICD-10-CM | POA: Insufficient documentation

## 2019-06-09 DIAGNOSIS — R0789 Other chest pain: Secondary | ICD-10-CM | POA: Diagnosis not present

## 2019-06-09 DIAGNOSIS — K219 Gastro-esophageal reflux disease without esophagitis: Secondary | ICD-10-CM | POA: Insufficient documentation

## 2019-06-09 DIAGNOSIS — M79602 Pain in left arm: Secondary | ICD-10-CM | POA: Insufficient documentation

## 2019-06-09 DIAGNOSIS — J0140 Acute pansinusitis, unspecified: Secondary | ICD-10-CM | POA: Diagnosis not present

## 2019-06-09 DIAGNOSIS — M25561 Pain in right knee: Secondary | ICD-10-CM | POA: Diagnosis not present

## 2019-06-09 DIAGNOSIS — Z20822 Contact with and (suspected) exposure to covid-19: Secondary | ICD-10-CM | POA: Insufficient documentation

## 2019-06-09 DIAGNOSIS — E785 Hyperlipidemia, unspecified: Secondary | ICD-10-CM | POA: Insufficient documentation

## 2019-06-09 DIAGNOSIS — F329 Major depressive disorder, single episode, unspecified: Secondary | ICD-10-CM | POA: Insufficient documentation

## 2019-06-09 DIAGNOSIS — G8929 Other chronic pain: Secondary | ICD-10-CM | POA: Insufficient documentation

## 2019-06-09 DIAGNOSIS — I10 Essential (primary) hypertension: Secondary | ICD-10-CM | POA: Diagnosis not present

## 2019-06-09 DIAGNOSIS — Z791 Long term (current) use of non-steroidal anti-inflammatories (NSAID): Secondary | ICD-10-CM | POA: Diagnosis not present

## 2019-06-09 DIAGNOSIS — F419 Anxiety disorder, unspecified: Secondary | ICD-10-CM | POA: Insufficient documentation

## 2019-06-09 DIAGNOSIS — Z79899 Other long term (current) drug therapy: Secondary | ICD-10-CM | POA: Diagnosis not present

## 2019-06-09 MED ORDER — DOXYCYCLINE HYCLATE 100 MG PO CAPS: 100.0000 mg | ORAL_CAPSULE | Freq: Two times a day (BID) | ORAL | 0 refills | Status: AC

## 2019-06-09 MED ORDER — BENZONATATE 100 MG PO CAPS
100.0000 mg | ORAL_CAPSULE | Freq: Three times a day (TID) | ORAL | 0 refills | Status: DC
Start: 2019-06-09 — End: 2019-12-02

## 2019-06-09 MED ORDER — SALINE SPRAY 0.65 % NA SOLN
1.0000 | NASAL | 0 refills | Status: DC | PRN
Start: 2019-06-09 — End: 2019-12-02

## 2019-06-09 NOTE — ED Triage Notes (Signed)
Pt presents with generalized body aches, ongoing headaches, and left side ear pressure for over a week.

## 2019-06-09 NOTE — Discharge Instructions (Signed)
Take doxycycline twice a day for 7 days Use the nasal saline spray throughout the day Take Tylenol for headache and pain relief.  2 regular strength tablets every 6 hours  Take the Tessalon for your cough  With regard to your chest discomfort, your EKG looks similar to previous.  I would like for you to follow-up with your primary care to discuss this.  If you have worsening chest pain, shortness of breath become nauseous or sweaty with this please of report immediately to the  emergency department   If your Covid-19 test is positive, you will receive a phone call from [facility] regarding your results. Negative test results are not called. Both positive and negative results area always visible on MyChart. If you do not have a MyChart account, sign up instructions are in your discharge papers.   Persons who are directed to care for themselves at home may discontinue isolation under the following conditions:   At least 10 days have passed since symptom onset and  At least 24 hours have passed without running a fever (this means without the use of fever-reducing medications) and  Other symptoms have improved.  Persons infected with COVID-19 who never develop symptoms may discontinue isolation and other precautions 10 days after the date of their first positive COVID-19 test.

## 2019-06-09 NOTE — ED Provider Notes (Addendum)
[place]    CSN: [id number] Arrival date & time: 06/09/19  1211      History   Chief Complaint Chief Complaint  Patient presents with  . Headache  . Generalized Body Aches    HPI Timothy Mccarthy is a 49 y.o. male.   Patient presents for evaluation of 1 to 2 weeks generalized body ache, headache, worsening nasal congestion.  He has also been having a cough as he feels something is running on the back of his throat.  He reports starting around 2 weeks ago he started having congestion after being exposed to a lot of pollen since that time his symptoms have been worsening.  He reports he had a Covid test on 06/02/2019 and this was negative.  He reports he has continued to have generalized body aches worsening nasal congestion to include purulence nasal discharge.  Said facial pain and a headache that has been persistent.  Headache is described as frontal and feels like pressure.  Reports his cough was initially slightly productive with yellow sputum.  Denies any chest pain or shortness of breath with cough.  Patient reports chronic nausea that he has medication for but denies increase.  Denies vomiting or diarrhea.  He has not had a fever measured but has had body ache and chills.  In addition to the symptoms above he also reports he has had for the last 1 week intermittent left-sided chest discomfort.  Patient is denying having the symptoms in clinic currently at rest, though during exam some of this was elicited.  He points to the left lateral side of his chest and into his axilla.  He describes the pain as pressure but also shoots up through his shoulder and down his left arm.  Denies radiation to his jaw.  He reports this started after doing something and moving his arm a certain way and pain shot up his arm and into his left side of his chest.  He reports occasionally since then he will have this discomfort when using his arms.  He reports he will have to take some deep breaths  when this occurs but denies this is shortness of breath.  He does not get nauseous or diaphoretic when this occurs.  Reports certain movements make the pain at present     Past Medical History:  Diagnosis Date  . Adjustment disorder with anxious mood   . Allergy   . Anxiety   . Arthritis    "hands" (09/28/2015)  . Depression   . GERD (gastroesophageal reflux disease)   . Hyperlipidemia   . Hypertension   . Tobacco use disorder     Patient Active Problem List   Diagnosis Date Noted  . Chronic pain of both knees 11/26/2017  . Hyperlipidemia 12/25/2016  . Anxiety 06/06/2016  . Essential hypertension 11/04/2015  . Abdominal hernia 09/28/2015  . Appendicitis 09/15/2015  . Tobacco use disorder 04/21/2015  . Adjustment disorder with anxious mood 04/21/2015  . Health care maintenance 04/21/2015  . GERD (gastroesophageal reflux disease) 04/21/2015    Past Surgical History:  Procedure Laterality Date  . APPENDECTOMY  09/15/2015  . CARPAL TUNNEL RELEASE Bilateral   . INCISIONAL HERNIA REPAIR N/A 09/29/2015   Procedure: HERNIA REPAIR INCISIONAL;  Surgeon: Erroll Luna, MD;  Location: [facility];  Service: General;  Laterality: N/A;  . LAPAROSCOPIC APPENDECTOMY N/A 09/15/2015   Procedure: APPENDECTOMY LAPAROSCOPIC;  Surgeon: Judeth Horn, MD;  Location: [facility];  Service: General;  Laterality: N/A;  .  LUMPS ON BACK  2013  . WISDOM TOOTH EXTRACTION         Home Medications    Prior to Admission medications   Medication Sig Start Date End Date Taking? Authorizing Provider  atorvastatin (LIPITOR) 20 MG tablet Take 1 tablet (20 mg total) by mouth daily. 02/16/19   Charlott Rakes, MD  benzonatate (TESSALON) 100 MG capsule Take 1 capsule (100 mg total) by mouth every 8 (eight) hours. 06/09/19   Taisia Fantini, Marguerita Beards, PA-C  busPIRone (BUSPAR) 15 MG tablet Take 1 tablet (15 mg total) by mouth 2 (two) times daily. 02/16/19   Charlott Rakes, MD  clindamycin (CLEOCIN) 300 MG capsule Take 1 capsule (300 mg  total) by mouth 2 (two) times daily. Patient not taking: Reported on 04/27/2019 03/23/19   Charlott Rakes, MD  doxycycline (VIBRAMYCIN) 100 MG capsule Take 1 capsule (100 mg total) by mouth 2 (two) times daily for 7 days. 06/09/19 06/16/19  Dorthey Depace, Marguerita Beards, PA-C  FLUoxetine (PROZAC) 20 MG tablet Take 1 tablet (20 mg total) by mouth daily. 03/09/19   Charlott Rakes, MD  fluticasone (FLONASE) 50 MCG/ACT nasal spray USE 2 SPRAY(S) IN EACH NOSTRIL DAILY 03/09/19   Charlott Rakes, MD  hydrochlorothiazide (HYDRODIURIL) 25 MG tablet Take 1 tablet (25 mg total) by mouth daily. 02/16/19   Charlott Rakes, MD  meloxicam (MOBIC) 15 MG tablet Take 1 tablet (15 mg total) by mouth daily. 12/10/18   Charlott Rakes, MD  methocarbamol (ROBAXIN) 500 MG tablet Take 1 tablet (500 mg total) by mouth every 8 (eight) hours as needed for muscle spasms. 04/27/19   Charlott Rakes, MD  Multiple Vitamin (MULTIVITAMIN WITH MINERALS) TABS tablet Take 2 tablets by mouth daily.     [provider]  ondansetron (ZOFRAN ODT) 4 MG disintegrating tablet Take 1 tablet (4 mg total) by mouth every 8 (eight) hours as needed. 09/10/18   McDonald, Mia A, PA-C  pantoprazole (PROTONIX) 40 MG tablet Take 1 tablet (40 mg total) by mouth 2 (two) times daily. Patient not taking: Reported on 04/27/2019 02/23/19   Levin Erp, PA  sodium chloride (OCEAN) 0.65 % SOLN nasal spray Place 1 spray into both nostrils as needed for congestion. 06/09/19   Aneka Fagerstrom, Marguerita Beards, PA-C    Family History Family History  Problem Relation Age of Onset  . Stroke Mother   . Diabetes type II Mother   . Diabetes Mother   . Colon polyps Neg Hx   . Esophageal cancer Neg Hx   . Rectal cancer Neg Hx   . Stomach cancer Neg Hx     Social History Social History   Tobacco Use  . Smoking status: Current Every Day Smoker    Packs/day: 0.50    Years: 23.00    Pack years: 11.50    Types: Cigarettes    Start date: 04/21/1987  . Smokeless tobacco: Never Used  .  Tobacco comment: Patches   Substance Use Topics  . Alcohol use: Yes    Alcohol/week: 4.0 standard drinks    Types: 2 Cans of beer, 2 Shots of liquor per week    Comment: "takes shots whenever has pain"  . Drug use: No     Allergies   Penicillins   Review of Systems Review of Systems  Per HPI Physical Exam Triage Vital Signs ED Triage Vitals  Enc Vitals Group     BP 06/09/19 1328 (!) 142/101     Pulse Rate 06/09/19 1328 89  Resp 06/09/19 1328 17     Temp 06/09/19 1328 98.6 F (37 C)     Temp Source 06/09/19 1328 Oral     SpO2 06/09/19 1328 92 %     Weight --      Height --      Head Circumference --      Peak Flow --      Pain Score 06/09/19 1326 7     Pain Loc --      Pain Edu? --      Excl. in [place]? --    No data found.  Updated Vital Signs BP (!) 142/101 (BP Location: Right Arm)   Pulse 89   Temp 98.6 F (37 C) (Oral)   Resp 17   SpO2 92%   Pulse oximetry rechecked by provider at 98% on room air. Visual Acuity Right Eye Distance:   Left Eye Distance:   Bilateral Distance:    Right Eye Near:   Left Eye Near:    Bilateral Near:     Physical Exam Vitals and nursing note reviewed.  Constitutional:      General: He is not in acute distress.    Appearance: He is well-developed. He is ill-appearing. He is not diaphoretic.  HENT:     Head: Normocephalic and atraumatic.     Comments: There is tenderness to palpation of the frontal and maxillary sinuses.  Bilateral turbinates erythematous and swollen with mucopurulent discharge visible on the right side.    Right Ear: Hearing, tympanic membrane, ear canal and external ear normal.     Left Ear: Hearing, tympanic membrane, ear canal and external ear normal.     Mouth/Throat:     Comments: Postnasal drip visible. Eyes:     Extraocular Movements: Extraocular movements intact.     Conjunctiva/sclera: Conjunctivae normal.  Cardiovascular:     Rate and Rhythm: Normal rate and regular rhythm.     Heart  sounds: No murmur.  Pulmonary:     Effort: Pulmonary effort is normal. No respiratory distress.     Breath sounds: Normal breath sounds. No wheezing, rhonchi or rales.  Chest:     Chest wall: Tenderness present.  Abdominal:     Palpations: Abdomen is soft.     Tenderness: There is no abdominal tenderness.  Musculoskeletal:     Cervical back: Neck supple.     Comments: tenderness to palpation over left lateral chest into left anterior shoulder.  Some pain elicited with passive range of motion of the left shoulder.  Lymphadenopathy:     Cervical: No cervical adenopathy.  Skin:    General: Skin is warm and dry.  Neurological:     Mental Status: He is alert.      UC Treatments / Results  Labs (all labs ordered are listed, but only abnormal results are displayed) Labs Reviewed  SARS CORONAVIRUS 2 (TAT 6-24 HRS)    EKG Normal sinus rhythm with nonspecific T wave abnormalities.  There is prolonged QT.  This is similar EKG to previous.  There are no signs of acute ischemia or infarction.  No acute abnormalities.  Radiology No results found.  Procedures Procedures (including critical care time)  Medications Ordered in UC Medications - No data to display  Initial Impression / Assessment and Plan / UC Course  I have reviewed the triage vital signs and the nursing notes.  Pertinent labs & imaging results that were available during my care of the patient were reviewed by me and considered  in my medical decision making (see chart for details).     #Acute sinusitis #Chest pain Patient is a 49 year old male presenting with symptoms consistent with acute bacterial sinusitis.  With regard to his chest pain this is likely musculoskeletal as there is reproducibility and an unchanged EKG from previous, however given there is some exertional component, though I do believe this is secondary to muscular pain, will have patient follow-up with primary care for further discussion.  EKG without  acute changes do not believe patient needs further work-up today.  Strict emergency department precautions were discussed and patient verbalized understanding this. -Doxycycline was prescribed as patient has penicillin allergy. -Discussed symptomatic care. Final Clinical Impressions(s) / UC Diagnoses   Final diagnoses:  Acute pansinusitis, recurrence not specified  Atypical chest pain     Discharge Instructions     Take doxycycline twice a day for 7 days Use the nasal saline spray throughout the day Take Tylenol for headache and pain relief.  2 regular strength tablets every 6 hours  Take the Tessalon for your cough  With regard to your chest discomfort, your EKG looks similar to previous.  I would like for you to follow-up with your primary care to discuss this.  If you have worsening chest pain, shortness of breath become nauseous or sweaty with this please of report immediately to the  emergency department   If your Covid-19 test is positive, you will receive a phone call from [facility] regarding your results. Negative test results are not called. Both positive and negative results area always visible on MyChart. If you do not have a MyChart account, sign up instructions are in your discharge papers.   Persons who are directed to care for themselves at home may discontinue isolation under the following conditions:  . At least 10 days have passed since symptom onset and . At least 24 hours have passed without running a fever (this means without the use of fever-reducing medications) and . Other symptoms have improved.  Persons infected with COVID-19 who never develop symptoms may discontinue isolation and other precautions 10 days after the date of their first positive COVID-19 test.        ED Prescriptions    Medication Sig Dispense Auth. Provider   doxycycline (VIBRAMYCIN) 100 MG capsule Take 1 capsule (100 mg total) by mouth 2 (two) times daily for 7 days. 14  capsule Brandilyn Nanninga, Marguerita Beards, PA-C   sodium chloride (OCEAN) 0.65 % SOLN nasal spray Place 1 spray into both nostrils as needed for congestion. 44 mL Humaira Sculley, Marguerita Beards, PA-C   benzonatate (TESSALON) 100 MG capsule Take 1 capsule (100 mg total) by mouth every 8 (eight) hours. 21 capsule Gleb Mcguire, Marguerita Beards, PA-C     PDMP not reviewed this encounter.   Purnell Shoemaker, PA-C 06/09/19 1743    Jaleah Lefevre, Marguerita Beards, PA-C 06/10/19 [id number]

## 2019-06-10 LAB — SARS CORONAVIRUS 2 (TAT 6-24 HRS): SARS Coronavirus 2: NEGATIVE

## 2019-06-15 ENCOUNTER — Ambulatory Visit: Payer: Self-pay | Admitting: Family Medicine

## 2019-07-06 ENCOUNTER — Other Ambulatory Visit: Payer: Self-pay | Admitting: Family Medicine

## 2019-07-06 DIAGNOSIS — G8929 Other chronic pain: Secondary | ICD-10-CM

## 2019-07-06 DIAGNOSIS — M25561 Pain in right knee: Secondary | ICD-10-CM

## 2019-07-21 ENCOUNTER — Encounter: Payer: Self-pay | Admitting: Family Medicine

## 2019-07-21 ENCOUNTER — Telehealth: Payer: Self-pay

## 2019-07-21 NOTE — Telephone Encounter (Signed)
Call placed to [facility] regarding status of CPAP order. Spoke to [place] who explained that they received the order 05/21/2019 and have tried to contact the patient multiple times.  He had informed them on 05/27/2019 that he would need to check with his HR department at his new job for the insurance information. They never received that information from him. They left several messages for the patient since then, the latest between  06/24/2019- 5/21/202 and had not heard back from him. No application for the hardship program was completed.  Timothy Mccarthy said that the machine is ready for pick up but it would be self pay without insurance coverage. This CM to follow up with the patient.  Call placed to patient regarding CPAP.  He stated that he just called [facility] this morning with the insurance information but he will call again to make sure that they have it. When this CM just contacted the company they did not have record of it.  Depending on the insurance coverage or lack of coverage he may qualify for hardship assistance. [employer] hardship program has changed since the company was acquired by [employer].

## 2019-07-22 NOTE — Telephone Encounter (Signed)
Thanks for the update

## 2019-07-23 ENCOUNTER — Ambulatory Visit: Payer: 59 | Attending: Family Medicine | Admitting: Family Medicine

## 2019-07-23 ENCOUNTER — Other Ambulatory Visit: Payer: Self-pay

## 2019-07-23 ENCOUNTER — Encounter: Payer: Self-pay | Admitting: Family Medicine

## 2019-07-23 VITALS — BP 153/105 | HR 92 | Ht 67.0 in | Wt 210.6 lb

## 2019-07-23 DIAGNOSIS — I10 Essential (primary) hypertension: Secondary | ICD-10-CM

## 2019-07-23 DIAGNOSIS — F419 Anxiety disorder, unspecified: Secondary | ICD-10-CM | POA: Diagnosis not present

## 2019-07-23 DIAGNOSIS — E7800 Pure hypercholesterolemia, unspecified: Secondary | ICD-10-CM | POA: Diagnosis not present

## 2019-07-23 DIAGNOSIS — M62838 Other muscle spasm: Secondary | ICD-10-CM

## 2019-07-23 DIAGNOSIS — F329 Major depressive disorder, single episode, unspecified: Secondary | ICD-10-CM

## 2019-07-23 DIAGNOSIS — Z1159 Encounter for screening for other viral diseases: Secondary | ICD-10-CM

## 2019-07-23 DIAGNOSIS — G8929 Other chronic pain: Secondary | ICD-10-CM

## 2019-07-23 DIAGNOSIS — F17200 Nicotine dependence, unspecified, uncomplicated: Secondary | ICD-10-CM

## 2019-07-23 DIAGNOSIS — M25562 Pain in left knee: Secondary | ICD-10-CM

## 2019-07-23 DIAGNOSIS — M5412 Radiculopathy, cervical region: Secondary | ICD-10-CM

## 2019-07-23 DIAGNOSIS — M25561 Pain in right knee: Secondary | ICD-10-CM

## 2019-07-23 DIAGNOSIS — M5442 Lumbago with sciatica, left side: Secondary | ICD-10-CM

## 2019-07-23 DIAGNOSIS — F32A Depression, unspecified: Secondary | ICD-10-CM

## 2019-07-23 MED ORDER — BUSPIRONE HCL 15 MG PO TABS: 15.0000 mg | ORAL_TABLET | Freq: Two times a day (BID) | ORAL | 1 refills | Status: DC

## 2019-07-23 MED ORDER — MELOXICAM 15 MG PO TABS: 15.0000 mg | ORAL_TABLET | Freq: Every day | ORAL | 2 refills | Status: DC

## 2019-07-23 MED ORDER — ATORVASTATIN CALCIUM 20 MG PO TABS: 20.0000 mg | ORAL_TABLET | Freq: Every day | ORAL | 1 refills | Status: DC

## 2019-07-23 MED ORDER — OMEPRAZOLE 40 MG PO CPDR
40.0000 mg | DELAYED_RELEASE_CAPSULE | Freq: Every day | ORAL | 1 refills | Status: DC
Start: 2019-07-23 — End: 2019-10-15

## 2019-07-23 MED ORDER — AMLODIPINE BESYLATE 5 MG PO TABS: 5.0000 mg | ORAL_TABLET | Freq: Every day | ORAL | 3 refills | Status: DC

## 2019-07-23 MED ORDER — HYDROCHLOROTHIAZIDE 25 MG PO TABS: 25.0000 mg | ORAL_TABLET | Freq: Every day | ORAL | 1 refills | Status: DC

## 2019-07-23 MED ORDER — METHOCARBAMOL 500 MG PO TABS: 500.0000 mg | ORAL_TABLET | Freq: Three times a day (TID) | ORAL | 1 refills | Status: DC | PRN

## 2019-07-23 NOTE — Progress Notes (Signed)
Having back and hip pain.

## 2019-07-23 NOTE — Progress Notes (Signed)
 Subjective:  Patient ID: Timothy Mccarthy, male    DOB: 11/01/1970  Age: 49 y.o. MRN: [id number]  CC: Back Pain and Hip Pain   HPI Garner M Mcwherter is a L handed 49-year-old male with a history of hypertension, GERD, anxiety, Osteoarthritis of the knee , recent laparoscopic umbilical hernia repair at [facility] here for a follow up visit. He complains 'it feels like someone is stinging him in his neck' and it radiates to his LUE LLE veins feel like they are being pulled and tight. Lower spine feels crooked and he feel like he is walking bent over to his L side. He is left handed. Symptoms have been present for one month with assocciated L hand numbness  LBP is described as pressure- like, increased with bending over ; he tosses and turns all night and nothing relieves the pain.  Smokes 5 cig/day and is not ready to quit. Doing well on his antihypertensive but blood pressure is elevated Anxiety and depression is controlled. He previously had chronic knee pain which is stable. Would love his PPI to be changed from Protonix back to omeprazole which he did previously as he has noticed Protonix is not as effective. Past Medical History:  Diagnosis Date  . Adjustment disorder with anxious mood   . Allergy   . Anxiety   . Arthritis    "hands" (09/28/2015)  . Depression   . GERD (gastroesophageal reflux disease)   . Hyperlipidemia   . Hypertension   . Tobacco use disorder     Past Surgical History:  Procedure Laterality Date  . APPENDECTOMY  09/15/2015  . CARPAL TUNNEL RELEASE Bilateral   . INCISIONAL HERNIA REPAIR N/A 09/29/2015   Procedure: HERNIA REPAIR INCISIONAL;  Surgeon: Thomas Cornett, MD;  Location: [facility];  Service: General;  Laterality: N/A;  . LAPAROSCOPIC APPENDECTOMY N/A 09/15/2015   Procedure: APPENDECTOMY LAPAROSCOPIC;  Surgeon: James Wyatt, MD;  Location: [facility] OR;  Service: General;  Laterality: N/A;  . LUMPS ON BACK  2013  . WISDOM TOOTH EXTRACTION      Family History  Problem  Relation Age of Onset  . Stroke Mother   . Diabetes type II Mother   . Diabetes Mother   . Colon polyps Neg Hx   . Esophageal cancer Neg Hx   . Rectal cancer Neg Hx   . Stomach cancer Neg Hx     Allergies  Allergen Reactions  . Penicillins Anaphylaxis    Has patient had a PCN reaction causing immediate rash, facial/tongue/throat swelling, SOB or lightheadedness with hypotensionNO Has patient had a PCN reaction causing severe rash involving mucus membranes or skin necrosis: NO Has patient had a PCN reaction that required hospitalization NO Has patient had a PCN reaction occurring within the last 10 years: NO If all of the above answers are "NO", then may proceed with Cephalosporin use.    Outpatient Medications Prior to Visit  Medication Sig Dispense Refill  . atorvastatin (LIPITOR) 20 MG tablet Take 1 tablet (20 mg total) by mouth daily. 90 tablet 1  . busPIRone (BUSPAR) 15 MG tablet Take 1 tablet (15 mg total) by mouth 2 (two) times daily. 180 tablet 1  . cetirizine (ZYRTEC) 10 MG tablet TAKE ONE TABLET BY MOUTH DAILY 60 tablet 0  . fluticasone (FLONASE) 50 MCG/ACT nasal spray USE 2 SPRAY(S) IN EACH NOSTRIL DAILY 16 g 2  . hydrochlorothiazide (HYDRODIURIL) 25 MG tablet Take 1 tablet (25 mg total) by mouth daily. 30 tablet 6  .   meloxicam (MOBIC) 15 MG tablet TAKE ONE TABLET BY MOUTH DAILY 30 tablet 0  . methocarbamol (ROBAXIN) 500 MG tablet Take 1 tablet (500 mg total) by mouth every 8 (eight) hours as needed for muscle spasms. 90 tablet 1  . Multiple Vitamin (MULTIVITAMIN WITH MINERALS) TABS tablet Take 2 tablets by mouth daily.     . ondansetron (ZOFRAN ODT) 4 MG disintegrating tablet Take 1 tablet (4 mg total) by mouth every 8 (eight) hours as needed. 20 tablet 0  . sodium chloride (OCEAN) 0.65 % SOLN nasal spray Place 1 spray into both nostrils as needed for congestion. 44 mL 0  . benzonatate (TESSALON) 100 MG capsule Take 1 capsule (100 mg total) by mouth every 8 (eight) hours.  (Patient not taking: Reported on 07/23/2019) 21 capsule 0  . clindamycin (CLEOCIN) 300 MG capsule Take 1 capsule (300 mg total) by mouth 2 (two) times daily. (Patient not taking: Reported on 04/27/2019) 20 capsule 0  . FLUoxetine (PROZAC) 20 MG tablet Take 1 tablet (20 mg total) by mouth daily. (Patient not taking: Reported on 07/23/2019) 30 tablet 3  . pantoprazole (PROTONIX) 40 MG tablet Take 1 tablet (40 mg total) by mouth 2 (two) times daily. (Patient not taking: Reported on 04/27/2019) 60 tablet 3   No facility-administered medications prior to visit.     ROS Review of Systems  Constitutional: Negative for activity change and appetite change.  HENT: Negative for sinus pressure and sore throat.   Eyes: Negative for visual disturbance.  Respiratory: Negative for cough, chest tightness and shortness of breath.   Cardiovascular: Negative for chest pain and leg swelling.  Gastrointestinal: Negative for abdominal distention, abdominal pain, constipation and diarrhea.  Endocrine: Negative.   Genitourinary: Negative for dysuria.  Musculoskeletal:       See HPI  Skin: Negative for rash.  Allergic/Immunologic: Negative.   Neurological: Positive for numbness. Negative for weakness and light-headedness.  Psychiatric/Behavioral: Negative for dysphoric mood and suicidal ideas.    Objective:  BP (!) 153/105 Comment: no medication this morning  Pulse 92   Ht 5' 7" (1.702 m)   Wt 210 lb 9.6 oz (95.5 kg)   SpO2 96%   BMI 32.98 kg/m   BP/Weight 07/23/2019 06/09/2019 05/07/2019  Systolic BP 153 142 -  Diastolic BP 105 101 -  Wt. (Lbs) 210.6 - 219  BMI 32.98 - 34.3      Physical Exam Constitutional:      Appearance: He is well-developed.  Neck:     Vascular: No JVD.  Cardiovascular:     Rate and Rhythm: Normal rate.     Heart sounds: Normal heart sounds. No murmur heard.   Pulmonary:     Effort: Pulmonary effort is normal.     Breath sounds: Normal breath sounds. No wheezing or  rales.  Chest:     Chest wall: No tenderness.  Abdominal:     General: Bowel sounds are normal. There is no distension.     Palpations: Abdomen is soft. There is no mass.     Tenderness: There is no abdominal tenderness.  Musculoskeletal:        General: Normal range of motion.     Cervical back: Tenderness (L side ) present.     Right lower leg: No edema.     Left lower leg: No edema.  Neurological:     Mental Status: He is alert and oriented to person, place, and time.     Sensory: No sensory deficit.       Comments: Hand grip: Left upper extremity-4/5; right upper extremity-5/5 Motor strength: Left lower extremity-4/5; right lower extremity-5/5  Psychiatric:        Mood and Affect: Mood normal.     CMP Latest Ref Rng & Units 12/09/2018 10/18/2018 09/09/2018  Glucose 70 - 99 mg/dL 115(H) 99 104(H)  BUN 6 - 20 mg/dL _0 Creatinine 0.61 - 1.24 mg/dL 1.45(H) 1.16 1.22  Sodium 135 - 145 mmol/L 140 139 138  Potassium 3.5 - 5.1 mmol/L 3.6 3.0(L) 3.7  Chloride 98 - 111 mmol/L 101 102 101  CO2 22 - 32 mmol/L _1 Calcium 8.9 - 10.3 mg/dL 9.6 9.1 8.9  Total Protein 6.5 - 8.1 g/dL 7.1 6.6 7.8  Total Bilirubin 0.3 - 1.2 mg/dL 0.6 0.6 0.8  Alkaline Phos 38 - 126 U/L 79 89 85  AST 15 - 41 U/L 29 29 36  ALT 0 - 44 U/L 38 32 29    Lipid Panel     Component Value Date/Time   CHOL 104 11/26/2017 1211   TRIG 281 (H) 11/26/2017 1211   HDL 28 (L) 11/26/2017 1211   CHOLHDL 3.7 11/26/2017 1211   LDLCALC 20 11/26/2017 1211    CBC    Component Value Date/Time   WBC 10.8 (H) 12/09/2018 0205   RBC 5.32 12/09/2018 0205   HGB 14.7 12/09/2018 0205   HGB 14.8 10/25/2017 1115   HCT 45.3 12/09/2018 0205   HCT 46.0 10/25/2017 1115   PLT 270 12/09/2018 0205   PLT 259 10/25/2017 1115   MCV 85.2 12/09/2018 0205   MCV 84 10/25/2017 1115   MCH 27.6 12/09/2018 0205   MCHC 32.5 12/09/2018 0205   RDW 13.3 12/09/2018 0205   RDW 13.3 10/25/2017 1115   LYMPHSABS 3.0 10/18/2018 1953    MONOABS 0.8 10/18/2018 1953   EOSABS 0.2 10/18/2018 1953   BASOSABS 0.1 10/18/2018 1953    Lab Results  Component Value Date   HGBA1C 6.0 (H) 03/27/2017    Assessment & Plan:  1. Pure hypercholesterolemia Normal total cholesterol but elevated triglycerides from previous labs We will order lipid panel today Low-cholesterol diet - atorvastatin (LIPITOR) 20 MG tablet; Take 1 tablet (20 mg total) by mouth daily.  Dispense: 90 tablet; Refill: 1  2. Anxiety and depression Stable - busPIRone (BUSPAR) 15 MG tablet; Take 1 tablet (15 mg total) by mouth 2 (two) times daily.  Dispense: 180 tablet; Refill: 1  3. Essential hypertension Uncontrolled Amlodipine added to regimen - amLODipine (NORVASC) 5 MG tablet; Take 1 tablet (5 mg total) by mouth daily.  Dispense: 90 tablet; Refill: 3 - CMP14+EGFR - Lipid panel - hydrochlorothiazide (HYDRODIURIL) 25 MG tablet; Take 1 tablet (25 mg total) by mouth daily.  Dispense: 90 tablet; Refill: 1  4. Muscle spasm Could explain some of his left-sided neck pain along with abnormal posturing He will benefit from PT - methocarbamol (ROBAXIN) 500 MG tablet; Take 1 tablet (500 mg total) by mouth every 8 (eight) hours as needed for muscle spasms.  Dispense: 90 tablet; Refill: 1  5. Chronic pain of both knees Stable - meloxicam (MOBIC) 15 MG tablet; Take 1 tablet (15 mg total) by mouth daily.  Dispense: 30 tablet; Refill: 2  6. Need for hepatitis C screening test - HCV RNA quant rflx ultra or genotyp([facility]/Sun[facility]) - meloxicam (MOBIC) 15 MG tablet; Take 1 tablet (15 mg total) by mouth daily.  Dispense: 30 tablet; Refill: 2  7. Cervical radiculopathy See #4 above  Advised to obtain OTC Voltaren gel and Lidoderm patches - Ambulatory referral to Physical Therapy  8. Acute left-sided low back pain with left-sided sciatica See #1 above - Ambulatory referral to Physical Therapy - meloxicam (MOBIC) 15 MG tablet; Take 1 tablet (15 mg total) by mouth  daily.  Dispense: 30 tablet; Refill: 2  9. Tobacco use disorder Spent 3 minutes counseling on cessation and he is not ready to quit  Return in about 3 months (around 10/23/2019) for Chronic medical conditions.      Enobong Newlin, MD, FAAFP. [facility] [place], [place] [phone]   07/23/2019, 9:14 AM 

## 2019-07-23 NOTE — Patient Instructions (Signed)

## 2019-07-24 LAB — CMP14+EGFR
ALT: 26 IU/L (ref 0–44)
AST: 25 IU/L (ref 0–40)
Albumin/Globulin Ratio: 1.7 (ref 1.2–2.2)
Albumin: 4.5 g/dL (ref 4.0–5.0)
Alkaline Phosphatase: 113 IU/L (ref 48–121)
BUN/Creatinine Ratio: 10 (ref 9–20)
BUN: 10 mg/dL (ref 6–24)
Bilirubin Total: 0.3 mg/dL (ref 0.0–1.2)
CO2: 25 mmol/L (ref 20–29)
Calcium: 9.7 mg/dL (ref 8.7–10.2)
Chloride: 98 mmol/L (ref 96–106)
Creatinine, Ser: 1.05 mg/dL (ref 0.76–1.27)
GFR calc Af Amer: 97 mL/min/{1.73_m2} (ref >59)
GFR calc non Af Amer: 84 mL/min/{1.73_m2} (ref >59)
Globulin, Total: 2.6 g/dL (ref 1.5–4.5)
Glucose: 97 mg/dL (ref 65–99)
Potassium: 4.2 mmol/L (ref 3.5–5.2)
Sodium: 140 mmol/L (ref 134–144)
Total Protein: 7.1 g/dL (ref 6.0–8.5)

## 2019-07-24 LAB — LIPID PANEL
Chol/HDL Ratio: 5.1 ratio — ABNORMAL HIGH (ref 0.0–5.0)
Cholesterol, Total: 133 mg/dL (ref 100–199)
HDL: 26 mg/dL — ABNORMAL LOW (ref >39)
LDL Chol Calc (NIH): 35 mg/dL (ref 0–99)
Triglycerides: 514 mg/dL — ABNORMAL HIGH (ref 0–149)
VLDL Cholesterol Cal: 72 mg/dL — ABNORMAL HIGH (ref 5–40)

## 2019-07-24 LAB — HCV RNA QUANT RFLX ULTRA OR GENOTYP: HCV Quant Baseline: NOT DETECTED IU/mL

## 2019-07-25 ENCOUNTER — Other Ambulatory Visit: Payer: Self-pay | Admitting: Family Medicine

## 2019-07-25 DIAGNOSIS — E7800 Pure hypercholesterolemia, unspecified: Secondary | ICD-10-CM

## 2019-07-25 MED ORDER — ATORVASTATIN CALCIUM 40 MG PO TABS: 40.0000 mg | ORAL_TABLET | Freq: Every day | ORAL | 1 refills | Status: DC

## 2019-08-11 ENCOUNTER — Other Ambulatory Visit: Payer: Self-pay

## 2019-08-11 ENCOUNTER — Ambulatory Visit: Payer: 59 | Attending: Family Medicine | Admitting: Physical Therapy

## 2019-08-11 DIAGNOSIS — M25562 Pain in left knee: Secondary | ICD-10-CM | POA: Diagnosis present

## 2019-08-11 DIAGNOSIS — M6281 Muscle weakness (generalized): Secondary | ICD-10-CM

## 2019-08-11 DIAGNOSIS — M542 Cervicalgia: Secondary | ICD-10-CM | POA: Diagnosis present

## 2019-08-11 DIAGNOSIS — M25561 Pain in right knee: Secondary | ICD-10-CM | POA: Diagnosis present

## 2019-08-11 DIAGNOSIS — G8929 Other chronic pain: Secondary | ICD-10-CM

## 2019-08-11 DIAGNOSIS — M545 Low back pain, unspecified: Secondary | ICD-10-CM

## 2019-08-11 NOTE — Patient Instructions (Signed)
Access Code: [email or web address] URL: [email or web address] Date: 08/11/2019 Prepared by: Hilda Blades  Exercises Seated Hamstring Stretch - 2 x daily - 7 x weekly - 2 reps - 30 seconds hold Hooklying Single Knee to Chest Stretch - 2 x daily - 7 x weekly - 2 reps - 30 seconds hold Supine Piriformis Stretch with Foot on Ground - 2 x daily - 7 x weekly - 2 reps - 30 seconds hold Supine Lower Trunk Rotation - 2 x daily - 7 x weekly - 2 reps - 30 seconds hold Modified Thomas Stretch - 2 x daily - 7 x weekly - 2 reps - 30 seconds hold Gastroc Stretch on Wall - 2 x daily - 7 x weekly - 2 reps - 30 seconds hold Seated Cervical Sidebending Stretch - 2 x daily - 7 x weekly - 2 reps - 30 seconds hold Gentle Levator Scapulae Stretch - 2 x daily - 7 x weekly - 2 reps - 30 seconds hold Standing Upper Trapezius Mobilization with Small Ball Standing Paraspinals Mobilization with Small Ball on Wall

## 2019-08-12 ENCOUNTER — Encounter: Payer: Self-pay | Admitting: Physical Therapy

## 2019-08-12 NOTE — Therapy (Signed)
[place], [place], [address] Phone: [phone]   Fax:  [phone]  Physical Therapy Evaluation  Patient Details  Name: Timothy Mccarthy MRN: [id number] Date of Birth: 1971-01-19 Referring Provider (PT): Charlott Rakes, MD   Encounter Date: 08/11/2019   PT End of Session - 08/11/19 1628    Visit Number 1    Number of Visits 8    Date for PT Re-Evaluation 10/06/19    Authorization Type [employer]    PT Start Time 1615    PT Stop Time 1700    PT Time Calculation (min) 45 min    Activity Tolerance Patient tolerated treatment well;Patient limited by pain    Behavior During Therapy [facility] for tasks assessed/performed           Past Medical History:  Diagnosis Date  . Adjustment disorder with anxious mood   . Allergy   . Anxiety   . Arthritis    "hands" (09/28/2015)  . Depression   . GERD (gastroesophageal reflux disease)   . Hyperlipidemia   . Hypertension   . Tobacco use disorder     Past Surgical History:  Procedure Laterality Date  . APPENDECTOMY  09/15/2015  . CARPAL TUNNEL RELEASE Bilateral   . INCISIONAL HERNIA REPAIR N/A 09/29/2015   Procedure: HERNIA REPAIR INCISIONAL;  Surgeon: Erroll Luna, MD;  Location: [place];  Service: General;  Laterality: N/A;  . LAPAROSCOPIC APPENDECTOMY N/A 09/15/2015   Procedure: APPENDECTOMY LAPAROSCOPIC;  Surgeon: Judeth Horn, MD;  Location: [place];  Service: General;  Laterality: N/A;  . LUMPS ON BACK  2013  . WISDOM TOOTH EXTRACTION      There were no vitals filed for this visit.    Subjective Assessment - 08/11/19 1617    Subjective Patient reports he has a lot of  arthritis all over his body. He has been having more left sided neck pain that wil go to top of his shoulder. Also has back pain all the time. Pain in his legs from working and walking that is like a throbbing, especially his feet at the end of the day, states that he feels the more he works the  worse his pain gets. He has had pain for many years and it just seems to get worse every year. Pain is worse typically in the evening after work, he will have stiffness in the morning and has difficulty getting dressed. He uses hot baths/showers to help but pain always comes back.    Pertinent History Current smoker, knee OA, anxiety/depression, HTN; patient reports previous low back surgery and 3 slipped discs    Limitations Sitting;Lifting;Standing;Walking;House hold activities    How long can you sit comfortably? No limitation    How long can you stand comfortably? Patient reports he stands all day for work    How long can you walk comfortably? Patient reports he walks all day for work    Diagnostic tests X-ray for knees performed in 2019    Patient Stated Goals Get rid of pain so he can continue working    Currently in Pain? Yes    Pain Score 5     Pain Location Back    Pain Orientation Right;Left;Lower    Pain Descriptors / Indicators Aching   pressure   Pain Type Chronic pain    Pain Onset More than a month ago    Pain Frequency Constant    Aggravating Factors  Lifting, walking, standing, bending forward  Pain Relieving Factors Medication    Effect of Pain on Daily Activities Patient is limited with some self care and at work    Multiple Pain Sites Yes    Pain Score 5    Pain Location Neck    Pain Orientation Left    Pain Descriptors / Indicators Aching   pressure   Pain Type Chronic pain    Pain Radiating Towards left arm    Pain Onset More than a month ago    Pain Frequency Constant    Aggravating Factors  Lifting    Pain Relieving Factors Medication    Effect of Pain on Daily Activities Patient is limited while at work              [facility] PT Assessment - 08/12/19 0001      Assessment   Medical Diagnosis Cervical radiculopathy, Acute left-sided low back pain with left-sided sciatica    Referring Provider (PT) Charlott Rakes, MD    Onset Date/Surgical Date --    patient reports pain for many years   Hand Dominance Left    Next MD Visit 08/13/2019    Prior Therapy Yes      Precautions   Precautions None      Restrictions   Weight Bearing Restrictions No      Balance Screen   Has the patient fallen in the past 6 months No    Has the patient had a decrease in activity level because of a fear of falling?  No    Is the patient reluctant to leave their home because of a fear of falling?  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, go up/down stairs, lifting    Leisure None reported      Cognition   Overall Cognitive Status Within Functional Limits for tasks assessed      Observation/Other Assessments   Observations Patient appears in no apparent distress    Focus on Therapeutic Outcomes (FOTO)  Lumbar: 60% limitation; Neck: 49% limitation      Sensation   Light Touch Appears Intact      Posture/Postural Control   Posture Comments Patient demonstrates rounded shoulder posture, increased lumbar lordosis at rest      ROM / Strength   AROM / PROM / Strength AROM;PROM;Strength      AROM   AROM Assessment Site Cervical;Lumbar    Cervical Flexion 50    Cervical Extension 30    Cervical - Right Side Bend 20    Cervical - Left Side Bend 30    Cervical - Right Rotation 60    Cervical - Left Rotation 65    Lumbar Flexion Patient demonstrates limited lumbar flexion, only able to reach to proximal shin but lumbar spine remains relative lordotic    Lumbar Extension 50%    Lumbar - Right Side Bend 50%    Lumbar - Left Side Bend 50%    Lumbar - Right Rotation 50%    Lumbar - Left Rotation 75%      PROM   Overall PROM Comments Hip PROM grossly WFL but with muscular tightness in all ranges      Strength   Overall Strength Comments Core  strength grossly 4-/5 MMT, periscapular strength grossly 4-/5  MMT    Strength Assessment Site Hip;Knee    Right/Left Hip Right;Left    Right Hip Flexion 4/5    Right Hip Extension 4-/5    Right Hip ABduction 4-/5    Left Hip Flexion 4/5    Left Hip Extension 4-/5    Left Hip ABduction 4-/5    Right/Left Knee Right;Left    Right Knee Flexion 4+/5    Right Knee Extension 4+/5    Left Knee Flexion 4+/5    Left Knee Extension 4+/5      Flexibility   Soft Tissue Assessment /Muscle Length yes    Hamstrings Limited bilaterally    Quadriceps Limited bilaterally   and hip flexors   Piriformis Limited bilaterally      Palpation   Palpation comment Patient demonstrated increased muscular tension of bilateral upper trap region      Special Tests   Other special tests Radicular testing negative for cervical and lumbar spine, he seems to have more muscular pain      Transfers   Transfers Independent with all Transfers                      Objective measurements completed on examination: See above findings.       [place] Adult PT Treatment/Exercise - 08/12/19 0001      Exercises   Exercises Neck;Lumbar      Lumbar Exercises: Stretches   Passive Hamstring Stretch 2 reps;30 seconds    Passive Hamstring Stretch Limitations seated edge of chair    Single Knee to Chest Stretch 2 reps;30 seconds    Single Knee to Chest Stretch Limitations hooklying grabbing under thigh    Lower Trunk Rotation 5 reps;10 seconds    Hip Flexor Stretch 2 reps;30 seconds    Hip Flexor Stretch Limitations supine edge of table    Piriformis Stretch 2 reps;30 seconds    Gastroc Stretch 2 reps;30 seconds    Gastroc Stretch Limitations standing at wall    Other Lumbar Stretch Exercise Self TPR using tennis ball for lumbar paraspinal region    Other Lumbar Stretch Exercise Trialed doorway QL stretch and lumbar decompression stretch but patient reported increased shoulder/low back discomfort so not  given for HEP      Neck Exercises: Stretches   Upper Trapezius Stretch 2 reps;30 seconds    Levator Stretch 2 reps;30 seconds    Other Neck Stretches Self TPR using tennis ball for upper trap region                  PT Education - 08/11/19 1628    Education Details Exam findings, POC, HEP, stretching    Person(s) Educated Patient    Methods Explanation;Demonstration;Tactile cues;Verbal cues;Handout    Comprehension Verbalized understanding;Returned demonstration;Verbal cues required;Tactile cues required;Need further instruction            PT Short Term Goals - 08/12/19 0830      PT SHORT TERM GOAL #1   Title Patient will be I with initial HEP to progress with PT    Time 4    Period Weeks    Status New    Target Date 09/08/19      PT SHORT TERM GOAL #2   Title Patient will be able to demonstrate proper posture and lifting mechanics to reduce pain at work    Time 4    Period Weeks    Status New    Target Date 09/08/19  PT SHORT TERM GOAL #3   Title Patient will report improved pain to </= 3/10 to improve functional mobility    Time 4    Period Weeks    Status New    Target Date 09/08/19             PT Long Term Goals - 08/12/19 0831      PT LONG TERM GOAL #1   Title Patient will be I with final HEP to maintain progress from PT    Time 8    Period Weeks    Status New    Target Date 10/06/19      PT LONG TERM GOAL #2   Title Patient will exhibit improved gross hip/core strength to >/= 4+/5 MMT and knee strength to = 5/5 MMT to improve lifting ability at work    Time 8    Period Weeks    Status New    Target Date 10/06/19      PT LONG TERM GOAL #3   Title Patient will exhibit >/= 25% improvement in lumbar motion and >/= 10 deg improvement with cervical side bending and rotation to improve dressing ability in morning    Time 8    Period Weeks    Status New    Target Date 10/06/19      PT LONG TERM GOAL #4   Title Patient will report  improved function of </= 35% limitation for neck and </= 41% for lumbar on FOTO    Time 8    Period Weeks    Status New    Target Date 10/06/19                  Plan - 08/12/19 0820    Clinical Impression Statement Patient presents to PT with generalized body pain, but does locate majority of his pain to lower back, neck, and knees. He doesn't demonstrate any radicular symptoms this visit, his pain mainly seems to be muscular related. He exhibits limitations in muscular flexibility throughout leading to limitations in motion, strength deficits of postural and core muscles, and impaired lifting mechanics likely contributing to discomfort. Patient does have a physical that job that he believes is contributing to his pain. This visit he was provided with exercises focused on improving muscle tightness and discomfort. He would benefit from continued skilled PT to continue progression of flexibility and initiate strengthening to improve lifting mechanics and tolerance for a physical job in order to reduce pain and limitation.    Personal Factors and Comorbidities Fitness;Past/Current Experience;Profession;Time since onset of injury/illness/exacerbation;Comorbidity 3+    Comorbidities Current smoker, knee OA, anxiety/depression, HTN    Examination-Activity Limitations Locomotion Level;Sleep;Squat;Stairs;Stand;Lift;Dressing;Carry;Bend    Examination-Participation Restrictions Community Activity;Shop;Yard Work;Laundry;Cleaning    Stability/Clinical Decision Making Evolving/Moderate complexity    Clinical Decision Making Moderate    Rehab Potential Good    PT Frequency 1x / week    PT Duration 8 weeks    PT Treatment/Interventions ADLs/Self Care Home Management;Cryotherapy;Electrical Stimulation;Iontophoresis 4mg /ml Dexamethasone;Moist Heat;Traction;Neuromuscular re-education;Balance training;Therapeutic exercise;Therapeutic activities;Functional mobility training;Stair training;Gait  training;Patient/family education;Manual techniques;Dry needling;Passive range of motion;Taping;Spinal Manipulations;Joint Manipulations    PT Next Visit Plan Assess HEP and progress PRN, continue generalized stretching program, manual/dry needling for neck and upper trap region, initiate core/hip/periscapular strengthening and lifting mechanics    PT Home Exercise Plan [id number]: supine SKTC stretch, piriformis stretch, LTR, supine hip flexor/quad stretch, seated hamstring stretch, standing calf stretch, seated upper trap and levator stretch, self TPR using tennis ball  Consulted and Agree with Plan of Care Patient           Patient will benefit from skilled therapeutic intervention in order to improve the following deficits and impairments:  Decreased range of motion, Decreased activity tolerance, Pain, Postural dysfunction, Decreased strength, Improper body mechanics, Impaired flexibility  Visit Diagnosis: Chronic bilateral low back pain, unspecified whether sciatica present  Cervicalgia  Chronic pain of right knee  Chronic pain of left knee  Muscle weakness (generalized)     Problem List Patient Active Problem List   Diagnosis Date Noted  . Chronic pain of both knees 11/26/2017  . Hyperlipidemia 12/25/2016  . Anxiety 06/06/2016  . Essential hypertension 11/04/2015  . Abdominal hernia 09/28/2015  . Appendicitis 09/15/2015  . Tobacco use disorder 04/21/2015  . Adjustment disorder with anxious mood 04/21/2015  . Health care maintenance 04/21/2015  . GERD (gastroesophageal reflux disease) 04/21/2015    Hilda Blades, PT, DPT, LAT, ATC 08/12/19  8:39 AM Phone: [phone] Fax: [facility] [address] [place], [place], [address] Phone: [phone]   Fax:  [phone]  Name: CARLTON BUSKEY MRN: [id number] Date of Birth: 04-02-1970

## 2019-08-13 ENCOUNTER — Ambulatory Visit: Payer: Self-pay | Admitting: Family Medicine

## 2019-08-24 ENCOUNTER — Encounter: Payer: Self-pay | Admitting: Physical Therapy

## 2019-08-24 ENCOUNTER — Other Ambulatory Visit: Payer: Self-pay

## 2019-08-24 ENCOUNTER — Ambulatory Visit: Payer: 59 | Attending: Family Medicine | Admitting: Physical Therapy

## 2019-08-24 DIAGNOSIS — M542 Cervicalgia: Secondary | ICD-10-CM | POA: Diagnosis present

## 2019-08-24 DIAGNOSIS — G8929 Other chronic pain: Secondary | ICD-10-CM | POA: Diagnosis present

## 2019-08-24 DIAGNOSIS — M25561 Pain in right knee: Secondary | ICD-10-CM | POA: Diagnosis present

## 2019-08-24 DIAGNOSIS — M545 Low back pain, unspecified: Secondary | ICD-10-CM

## 2019-08-24 DIAGNOSIS — M6281 Muscle weakness (generalized): Secondary | ICD-10-CM | POA: Diagnosis present

## 2019-08-24 DIAGNOSIS — M25562 Pain in left knee: Secondary | ICD-10-CM | POA: Insufficient documentation

## 2019-08-24 NOTE — Therapy (Signed)
[place], [place], [address] Phone: [phone]   Fax:  [phone]  Physical Therapy Treatment  Patient Details  Name: Timothy Mccarthy MRN: [id number] Date of Birth: Aug 05, 1970 Referring Provider (PT): Charlott Rakes, MD   Encounter Date: 08/24/2019   PT End of Session - 08/24/19 1626    Visit Number 2    Number of Visits 8    Date for PT Re-Evaluation 10/06/19    Authorization Type [employer]    PT Start Time 1558   arrived late   PT Stop Time 1640    PT Time Calculation (min) 42 min    Activity Tolerance Patient tolerated treatment well    Behavior During Therapy [facility] for tasks assessed/performed           Past Medical History:  Diagnosis Date  . Adjustment disorder with anxious mood   . Allergy   . Anxiety   . Arthritis    "hands" (09/28/2015)  . Depression   . GERD (gastroesophageal reflux disease)   . Hyperlipidemia   . Hypertension   . Tobacco use disorder     Past Surgical History:  Procedure Laterality Date  . APPENDECTOMY  09/15/2015  . CARPAL TUNNEL RELEASE Bilateral   . INCISIONAL HERNIA REPAIR N/A 09/29/2015   Procedure: HERNIA REPAIR INCISIONAL;  Surgeon: Erroll Luna, MD;  Location: [place];  Service: General;  Laterality: N/A;  . LAPAROSCOPIC APPENDECTOMY N/A 09/15/2015   Procedure: APPENDECTOMY LAPAROSCOPIC;  Surgeon: Judeth Horn, MD;  Location: [place];  Service: General;  Laterality: N/A;  . LUMPS ON BACK  2013  . WISDOM TOOTH EXTRACTION      There were no vitals filed for this visit.   Subjective Assessment - 08/24/19 1617    Subjective Pt. continues with left upper trapezius region pain in addition to LBP and bilat. knee pain. He reports stretches from HEP have been helpful for decreasing tightness.    Pertinent History Current smoker, knee OA, anxiety/depression, HTN; patient reports previous low back surgery and 3 slipped discs    Currently in Pain? Yes    Pain Score 4       Pain Location Bladder    Pain Orientation Left;Right;Lower    Pain Descriptors / Indicators Aching    Pain Onset More than a month ago    Pain Frequency Constant    Aggravating Factors  lifting, bending, prolonged standing and walking    Pain Relieving Factors medication    Effect of Pain on Daily Activities limits self care and ability work activities                             [facility] Adult PT Treatment/Exercise - 08/24/19 0001      Lumbar Exercises: Stretches   Double Knee to Chest Stretch Limitations DKTC with legs on 55 cm P-all x 15 reps      Lumbar Exercises: Standing   Functional Squats Limitations TRX squat 2x10      Lumbar Exercises: Supine   Pelvic Tilt 15 reps    Clam 15 reps    Clam Limitations blue band    Bent Knee Raise 15 reps    Bridge 15 reps    Straight Leg Raise 15 reps      Modalities   Modalities Moist Heat      Moist Heat Therapy   Number Minutes Moist Heat 10 Minutes    Moist Heat  Location Cervical   left upper trapezius region     Manual Therapy   Manual Therapy Joint mobilization;Manual Traction    Joint Mobilization LAD bilat. hips grade I-IV oscillations    Manual Traction cervical      Neck Exercises: Stretches   Upper Trapezius Stretch Left;3 reps;30 seconds    Levator Stretch Left;3 reps;30 seconds            Trigger Point Dry Needling - 08/24/19 0001    Consent Given? Yes    Education Handout Provided Yes    Muscles Treated Head and Neck Upper trapezius;Levator scapulae   left side   Dry Needling Comments needling in prone with 32 gauge 30 mm needles    Upper Trapezius Response Twitch reponse elicited    Levator Scapulae Response Twitch response elicited                PT Education - 08/24/19 1620    Education Details dry needling, exercises, symptom etiology    Person(s) Educated Patient    Methods Explanation;Handout;Demonstration;Tactile cues;Verbal cues    Comprehension Verbalized  understanding;Returned demonstration            PT Short Term Goals - 08/12/19 0830      PT SHORT TERM GOAL #1   Title Patient will be I with initial HEP to progress with PT    Time 4    Period Weeks    Status New    Target Date 09/08/19      PT SHORT TERM GOAL #2   Title Patient will be able to demonstrate proper posture and lifting mechanics to reduce pain at work    Time 4    Period Weeks    Status New    Target Date 09/08/19      PT SHORT TERM GOAL #3   Title Patient will report improved pain to </= 3/10 to improve functional mobility    Time 4    Period Weeks    Status New    Target Date 09/08/19             PT Long Term Goals - 08/12/19 0831      PT LONG TERM GOAL #1   Title Patient will be I with final HEP to maintain progress from PT    Time 8    Period Weeks    Status New    Target Date 10/06/19      PT LONG TERM GOAL #2   Title Patient will exhibit improved gross hip/core strength to >/= 4+/5 MMT and knee strength to = 5/5 MMT to improve lifting ability at work    Time 8    Period Weeks    Status New    Target Date 10/06/19      PT LONG TERM GOAL #3   Title Patient will exhibit >/= 25% improvement in lumbar motion and >/= 10 deg improvement with cervical side bending and rotation to improve dressing ability in morning    Time 8    Period Weeks    Status New    Target Date 10/06/19      PT LONG TERM GOAL #4   Title Patient will report improved function of </= 35% limitation for neck and </= 41% for lumbar on FOTO    Time 8    Period Weeks    Status New    Target Date 10/06/19  Plan - 08/24/19 1750    Clinical Impression Statement Session was abbreviated due to pt. running late but included brief trial dry needling to left upper trapezius and levator region, otherwise focus back/core/knee strengthening and stretches. Will await further tx. response by next session re: dry needling, otherwise/overall expect progress will  be gradual given symptom etiology and duration.    Personal Factors and Comorbidities Fitness;Past/Current Experience;Profession;Time since onset of injury/illness/exacerbation;Comorbidity 3+    Comorbidities Current smoker, knee OA, anxiety/depression, HTN    Examination-Activity Limitations Locomotion Level;Sleep;Squat;Stairs;Stand;Lift;Dressing;Carry;Bend    Examination-Participation Restrictions Community Activity;Shop;Yard Work;Laundry;Cleaning    Stability/Clinical Decision Making Evolving/Moderate complexity    Clinical Decision Making Moderate    Rehab Potential Good    PT Frequency 1x / week    PT Duration 8 weeks    PT Treatment/Interventions ADLs/Self Care Home Management;Cryotherapy;Electrical Stimulation;Iontophoresis 4mg /ml Dexamethasone;Moist Heat;Traction;Neuromuscular re-education;Balance training;Therapeutic exercise;Therapeutic activities;Functional mobility training;Stair training;Gait training;Patient/family education;Manual techniques;Dry needling;Passive range of motion;Taping;Spinal Manipulations;Joint Manipulations    PT Next Visit Plan Check response dry needling, Assess HEP and progress PRN, continue generalized stretching program, manual/dry needling for neck and upper trap region, initiate core/hip/periscapular strengthening and lifting mechanics    PT Home Exercise Plan [id number]: supine SKTC stretch, piriformis stretch, LTR, supine hip flexor/quad stretch, seated hamstring stretch, standing calf stretch, seated upper trap and levator stretch, self TPR using tennis ball    Consulted and Agree with Plan of Care Patient           Patient will benefit from skilled therapeutic intervention in order to improve the following deficits and impairments:  Decreased range of motion, Decreased activity tolerance, Pain, Postural dysfunction, Decreased strength, Improper body mechanics, Impaired flexibility  Visit Diagnosis: Chronic bilateral low back pain, unspecified whether  sciatica present  Cervicalgia  Chronic pain of right knee  Chronic pain of left knee  Muscle weakness (generalized)     Problem List Patient Active Problem List   Diagnosis Date Noted  . Chronic pain of both knees 11/26/2017  . Hyperlipidemia 12/25/2016  . Anxiety 06/06/2016  . Essential hypertension 11/04/2015  . Abdominal hernia 09/28/2015  . Appendicitis 09/15/2015  . Tobacco use disorder 04/21/2015  . Adjustment disorder with anxious mood 04/21/2015  . Health care maintenance 04/21/2015  . GERD (gastroesophageal reflux disease) 04/21/2015    Beaulah Dinning, PT, DPT 08/24/19 6:03 PM  [facility] [address] [place], [place], [address] Phone: [phone]   Fax:  [phone]  Name: Timothy Mccarthy MRN: [id number] Date of Birth: 12-29-1970

## 2019-08-24 NOTE — Patient Instructions (Signed)

## 2019-09-02 ENCOUNTER — Ambulatory Visit: Payer: 59 | Admitting: Physical Therapy

## 2019-09-07 ENCOUNTER — Telehealth: Payer: Self-pay | Admitting: Physical Therapy

## 2019-09-07 ENCOUNTER — Ambulatory Visit: Payer: 59 | Admitting: Physical Therapy

## 2019-09-07 NOTE — Telephone Encounter (Signed)
Contacted patient due to missed PT appointment. Patient stated he was unable to make it to therapy. He was informed of his next scheduled appointment on 09/14/2019 and of the attendance policy. Patient expressed understanding.  Hilda Blades, PT, DPT, LAT, ATC 09/07/19  4:54 PM Phone: [phone] Fax: [phone]

## 2019-09-14 ENCOUNTER — Telehealth: Payer: Self-pay | Admitting: Physical Therapy

## 2019-09-14 ENCOUNTER — Ambulatory Visit: Payer: 59 | Attending: Family Medicine | Admitting: Physical Therapy

## 2019-09-14 NOTE — Telephone Encounter (Signed)
Attempted to contact patient due to missing PT appointment. LVM informing patient of 2nd consecutive no-show and reminding him of attendance policy. Patient did not have any further scheduled visits so he was instructed to call the office if he would like to schedule further PT appointments.  Hilda Blades, PT, DPT, LAT, ATC 09/14/19  4:51 PM Phone: [phone] Fax: [phone]

## 2019-10-01 NOTE — Therapy (Signed)
Timothy Mccarthy, [place], [address] Phone: [phone]   Fax:  [phone]  Physical Therapy Treatment/Discharge  Patient Details  Name: Timothy Mccarthy MRN: [id number] Date of Birth: 07/25/1970 Referring Provider (PT): Charlott Rakes, MD   Encounter Date: 08/24/2019    Past Medical History:  Diagnosis Date  . Adjustment disorder with anxious mood   . Allergy   . Anxiety   . Arthritis    "hands" (09/28/2015)  . Depression   . GERD (gastroesophageal reflux disease)   . Hyperlipidemia   . Hypertension   . Tobacco use disorder     Past Surgical History:  Procedure Laterality Date  . APPENDECTOMY  09/15/2015  . CARPAL TUNNEL RELEASE Bilateral   . INCISIONAL HERNIA REPAIR N/A 09/29/2015   Procedure: HERNIA REPAIR INCISIONAL;  Surgeon: Erroll Luna, MD;  Location: [place];  Service: General;  Laterality: N/A;  . LAPAROSCOPIC APPENDECTOMY N/A 09/15/2015   Procedure: APPENDECTOMY LAPAROSCOPIC;  Surgeon: Judeth Horn, MD;  Location: [facility];  Service: General;  Laterality: N/A;  . LUMPS ON BACK  2013  . WISDOM TOOTH EXTRACTION      There were no vitals filed for this visit.                                PT Short Term Goals - 08/12/19 0830      PT SHORT TERM GOAL #1   Title Patient will be I with initial HEP to progress with PT    Time 4    Period Weeks    Status New    Target Date 09/08/19      PT SHORT TERM GOAL #2   Title Patient will be able to demonstrate proper posture and lifting mechanics to reduce pain at work    Time 4    Period Weeks    Status New    Target Date 09/08/19      PT SHORT TERM GOAL #3   Title Patient will report improved pain to </= 3/10 to improve functional mobility    Time 4    Period Weeks    Status New    Target Date 09/08/19             PT Long Term Goals - 08/12/19 0831      PT LONG TERM GOAL #1   Title Patient will be I with final  HEP to maintain progress from PT    Time 8    Period Weeks    Status New    Target Date 10/06/19      PT LONG TERM GOAL #2   Title Patient will exhibit improved gross hip/core strength to >/= 4+/5 MMT and knee strength to = 5/5 MMT to improve lifting ability at work    Time 8    Period Weeks    Status New    Target Date 10/06/19      PT LONG TERM GOAL #3   Title Patient will exhibit >/= 25% improvement in lumbar motion and >/= 10 deg improvement with cervical side bending and rotation to improve dressing ability in morning    Time 8    Period Weeks    Status New    Target Date 10/06/19      PT LONG TERM GOAL #4   Title Patient will report improved function of </= 35% limitation for neck and </= 41% for lumbar  on FOTO    Time 8    Period Weeks    Status New    Target Date 10/06/19                  Patient will benefit from skilled therapeutic intervention in order to improve the following deficits and impairments:  Decreased range of motion, Decreased activity tolerance, Pain, Postural dysfunction, Decreased strength, Improper body mechanics, Impaired flexibility  Visit Diagnosis: Chronic bilateral low back pain, unspecified whether sciatica present  Cervicalgia  Chronic pain of right knee  Chronic pain of left knee  Muscle weakness (generalized)     Problem List Patient Active Problem List   Diagnosis Date Noted  . Chronic pain of both knees 11/26/2017  . Hyperlipidemia 12/25/2016  . Anxiety 06/06/2016  . Essential hypertension 11/04/2015  . Abdominal hernia 09/28/2015  . Appendicitis 09/15/2015  . Tobacco use disorder 04/21/2015  . Adjustment disorder with anxious mood 04/21/2015  . Health care maintenance 04/21/2015  . GERD (gastroesophageal reflux disease) 04/21/2015       PHYSICAL THERAPY DISCHARGE SUMMARY  Visits from Start of Care: 2  Current functional level related to goals / functional outcomes: Patient did not return for further  PT after last session 08/24/19 with no show for last 2 scheduled therapy appointments. No further PT visits scheduled at this time.   Remaining deficits: Status unknown   Education / Equipment: HEP Plan: Patient agrees to discharge.  Patient goals were not met. Patient is being discharged due to not returning since the last visit.  ?????           Beaulah Dinning, PT, DPT 10/01/19 3:37 PM        [facility] [address] [place], [place], [address] Phone: [phone]   Fax:  [phone]  Name: Timothy Mccarthy MRN: [id number] Date of Birth: February 12, 1971

## 2019-10-09 ENCOUNTER — Encounter: Payer: Self-pay | Admitting: Family Medicine

## 2019-10-12 ENCOUNTER — Other Ambulatory Visit: Payer: Self-pay

## 2019-10-12 DIAGNOSIS — J3089 Other allergic rhinitis: Secondary | ICD-10-CM

## 2019-10-12 MED ORDER — CETIRIZINE HCL 10 MG PO TABS: 10.0000 mg | ORAL_TABLET | Freq: Every day | ORAL | 0 refills | Status: DC

## 2019-10-12 MED ORDER — FLUTICASONE PROPIONATE 50 MCG/ACT NA SUSP: NASAL | 2 refills | Status: DC

## 2019-10-15 ENCOUNTER — Other Ambulatory Visit: Payer: Self-pay | Admitting: Family Medicine

## 2019-10-15 MED ORDER — OMEPRAZOLE 40 MG PO CPDR: 40.0000 mg | DELAYED_RELEASE_CAPSULE | Freq: Every day | ORAL | 1 refills | Status: DC

## 2019-11-11 ENCOUNTER — Other Ambulatory Visit: Payer: Self-pay | Admitting: Family Medicine

## 2019-11-11 DIAGNOSIS — M5442 Lumbago with sciatica, left side: Secondary | ICD-10-CM

## 2019-11-11 DIAGNOSIS — G8929 Other chronic pain: Secondary | ICD-10-CM

## 2019-11-11 DIAGNOSIS — Z1159 Encounter for screening for other viral diseases: Secondary | ICD-10-CM

## 2019-11-12 ENCOUNTER — Encounter: Payer: Self-pay | Admitting: Family Medicine

## 2019-11-30 ENCOUNTER — Telehealth: Payer: Self-pay

## 2019-11-30 NOTE — Telephone Encounter (Signed)
I called patient and he states he has been having a lot of emesis shortly after eating for about 2 months. He admits he has been eating a lot of spicy foods. Also he has been taking his Omeprazole at night. I asked him to take his Omeprazole in the morning, a least 30 mins. Before his first meal and stop the spicy foods for a least 1-2 weeks and let us know if that doesn't help his symptoms. He agreed

## 2019-12-02 ENCOUNTER — Other Ambulatory Visit: Payer: Self-pay

## 2019-12-02 ENCOUNTER — Encounter (HOSPITAL_COMMUNITY): Payer: Self-pay | Admitting: Emergency Medicine

## 2019-12-02 ENCOUNTER — Ambulatory Visit (HOSPITAL_COMMUNITY)
Admission: EM | Admit: 2019-12-02 | Discharge: 2019-12-02 | Disposition: A | Discharge status: Home / Self Care | Payer: 59 | Attending: Family Medicine | Admitting: Family Medicine

## 2019-12-02 DIAGNOSIS — R1110 Vomiting, unspecified: Secondary | ICD-10-CM | POA: Diagnosis not present

## 2019-12-02 DIAGNOSIS — R079 Chest pain, unspecified: Secondary | ICD-10-CM | POA: Diagnosis not present

## 2019-12-02 DIAGNOSIS — R109 Unspecified abdominal pain: Secondary | ICD-10-CM | POA: Diagnosis not present

## 2019-12-02 DIAGNOSIS — M79673 Pain in unspecified foot: Secondary | ICD-10-CM | POA: Diagnosis not present

## 2019-12-02 DIAGNOSIS — R110 Nausea: Secondary | ICD-10-CM | POA: Insufficient documentation

## 2019-12-02 DIAGNOSIS — R52 Pain, unspecified: Secondary | ICD-10-CM | POA: Diagnosis not present

## 2019-12-02 LAB — CBC
HCT: 44.6 % (ref 39.0–52.0)
Hemoglobin: 14.4 g/dL (ref 13.0–17.0)
MCH: 27.3 pg (ref 26.0–34.0)
MCHC: 32.3 g/dL (ref 30.0–36.0)
MCV: 84.6 fL (ref 80.0–100.0)
Platelets: 278 10*3/uL (ref 150–400)
RBC: 5.27 MIL/uL (ref 4.22–5.81)
RDW: 14.4 % (ref 11.5–15.5)
WBC: 15.4 10*3/uL — ABNORMAL HIGH (ref 4.0–10.5)
nRBC: 0 % (ref 0.0–0.2)

## 2019-12-02 LAB — COMPREHENSIVE METABOLIC PANEL
ALT: 35 U/L (ref 0–44)
AST: 30 U/L (ref 15–41)
Albumin: 4.2 g/dL (ref 3.5–5.0)
Alkaline Phosphatase: 85 U/L (ref 38–126)
Anion gap: 12 (ref 5–15)
BUN: 11 mg/dL (ref 6–20)
CO2: 27 mmol/L (ref 22–32)
Calcium: 9.4 mg/dL (ref 8.9–10.3)
Chloride: 101 mmol/L (ref 98–111)
Creatinine, Ser: 1.27 mg/dL — ABNORMAL HIGH (ref 0.61–1.24)
GFR, Estimated: >60 mL/min (ref >60)
Glucose, Bld: 90 mg/dL (ref 70–99)
Potassium: 3.5 mmol/L (ref 3.5–5.1)
Sodium: 140 mmol/L (ref 135–145)
Total Bilirubin: 0.4 mg/dL (ref 0.3–1.2)
Total Protein: 7.2 g/dL (ref 6.5–8.1)

## 2019-12-02 LAB — CBG MONITORING, ED: Glucose-Capillary: 118 mg/dL — ABNORMAL HIGH (ref 70–99)

## 2019-12-02 LAB — LIPASE, BLOOD: Lipase: 28 U/L (ref 11–51)

## 2019-12-02 MED ORDER — ONDANSETRON 4 MG PO TBDP
4.0000 mg | ORAL_TABLET | Freq: Three times a day (TID) | ORAL | 0 refills | Status: DC | PRN
Start: 2019-12-02 — End: 2020-02-16

## 2019-12-02 NOTE — ED Triage Notes (Signed)
Patient presents to urgent care today with symptoms of emesis, body aches, and bilateral alternating foot pain. Symptoms began about a month ago after traveling to [place].

## 2019-12-02 NOTE — Discharge Instructions (Signed)
Increase your acid medicine to twice daily.

## 2019-12-04 LAB — HEMOGLOBIN A1C
Hgb A1c MFr Bld: 6.1 % — ABNORMAL HIGH (ref 4.8–5.6)
Mean Plasma Glucose: 128 mg/dL

## 2019-12-05 NOTE — ED Provider Notes (Signed)
[place]   [id number] 12/02/19 Arrival Time: 1601  ASSESSMENT & PLAN:  1. Abdominal discomfort   2. Nausea     Overall with a benign abdominal exam. No indications for urgent abdominal/pelvic imaging at this time. Discussed.  Labs pending. Will notify him of any significant abnormalities.  If needed: Meds ordered this encounter  Medications  . ondansetron (ZOFRAN-ODT) 4 MG disintegrating tablet    Sig: Take 1 tablet (4 mg total) by mouth every 8 (eight) hours as needed for nausea or vomiting.    Dispense:  15 tablet    Refill:  0     Discharge Instructions     Increase your acid medicine to twice daily.    Recommend:  Follow-up Information    Schedule an appointment as soon as possible for a visit  with Charlott Rakes, MD.   Specialty: Family Medicine Contact information: [place] [address] [phone]              Agreed to ED evaluation should symptoms worsen.  Reviewed expectations re: course of current medical issues. Questions answered. Outlined signs and symptoms indicating need for more acute intervention. Patient verbalized understanding. After Visit Summary given.   SUBJECTIVE: History from: patient. Timothy Mccarthy is a 49 y.o. male who presents with complaint of body aches, n/v/d, and bilateral LE foot pain. Few days. Abdomen "sore but not really painful". Is belching; takes Prilosec daily. Afebrile. No sick contacts. Normal urination. No back pain. Ambulatory without difficulty.  Reviewed: Past Surgical History:  Procedure Laterality Date  . APPENDECTOMY  09/15/2015  . CARPAL TUNNEL RELEASE Bilateral   . INCISIONAL HERNIA REPAIR N/A 09/29/2015   Procedure: HERNIA REPAIR INCISIONAL;  Surgeon: Erroll Luna, MD;  Location: [place];  Service: General;  Laterality: N/A;  . LAPAROSCOPIC APPENDECTOMY N/A 09/15/2015   Procedure: APPENDECTOMY LAPAROSCOPIC;  Surgeon: Judeth Horn, MD;  Location: [place];  Service:  General;  Laterality: N/A;  . LUMPS ON BACK  2013  . WISDOM TOOTH EXTRACTION       OBJECTIVE:  Vitals:   12/02/19 1907  BP: (!) 143/98  Pulse: 96  Resp: 17  Temp: 98 F (36.7 C)  TempSrc: Oral  SpO2: 100%    General appearance: alert, oriented, no acute distress but appears fatigued HEENT: [place]; AT; oropharynx moist Lungs: unlabored respirations Abdomen: soft; without distention; mild  generalized tenderness desc as "cramping feeling"; normal bowel sounds; without masses or organomegaly; without guarding or rebound tenderness Back: without reported CVA tenderness; FROM at waist Extremities: without LE edema; symmetrical; without gross deformities Skin: warm and dry Neurologic: normal gait Psychological: alert and cooperative; normal mood and affect  Labs:  Results for orders placed or performed during the hospital encounter of 12/02/19  CBC  Comprehensive metabolic panel  Hemoglobin A1c  Lipase, blood  POC CBG monitoring  Result Value Ref Range   Glucose-Capillary 118 (H) 70 - 99 mg/dL      Allergies  Allergen Reactions  . Penicillins Anaphylaxis    Has patient had a PCN reaction causing immediate rash, facial/tongue/throat swelling, SOB or lightheadedness with hypotensionNO Has patient had a PCN reaction causing severe rash involving mucus membranes or skin necrosis: NO Has patient had a PCN reaction that required hospitalization NO Has patient had a PCN reaction occurring within the last 10 years: NO If all of the above answers are "NO", then may proceed with Cephalosporin use.  Past Medical History:  Diagnosis Date  . Adjustment disorder with anxious mood   . Allergy   . Anxiety   . Arthritis    "hands" (09/28/2015)  . Depression   . GERD (gastroesophageal reflux disease)   . Hyperlipidemia   . Hypertension   . Tobacco use disorder     Social History   Socioeconomic History  . Marital status: Significant  Other    Spouse name: Not on file  . Number of children: Not on file  . Years of education: Not on file  . Highest education level: Not on file  Occupational History  . Not on file  Tobacco Use  . Smoking status: Current Every Day Smoker    Packs/day: 0.50    Years: 23.00    Pack years: 11.50    Types: Cigarettes    Start date: 04/21/1987  . Smokeless tobacco: Never Used  . Tobacco comment: Patches   Vaping Use  . Vaping Use: Never used  Substance and Sexual Activity  . Alcohol use: Yes    Alcohol/week: 4.0 standard drinks    Types: 2 Cans of beer, 2 Shots of liquor per week    Comment: "takes shots whenever has pain"  . Drug use: No  . Sexual activity: Yes  Other Topics Concern  . 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   Social Determinants of Health   Financial Resource Strain:   . Difficulty of Paying Living Expenses: Not on file  Food Insecurity:   . Worried About Charity fundraiser in the Last Year: Not on file  . Ran Out of Food in the Last Year: Not on file  Transportation Needs:   . Lack of Transportation (Medical): Not on file  . Lack of Transportation (Non-Medical): Not on file  Physical Activity:   . Days of Exercise per Week: Not on file  . Minutes of Exercise per Session: Not on file  Stress:   . Feeling of Stress : Not on file  Social Connections:   . Frequency of Communication with Friends and Family: Not on file  . Frequency of Social Gatherings with Friends and Family: Not on file  . Attends Religious Services: Not on file  . Active Member of Clubs or Organizations: Not on file  . Attends Archivist Meetings: Not on file  . Marital Status: Not on file  Intimate Partner Violence:   . Fear of Current or Ex-Partner: Not on file  . Emotionally Abused: Not on file  . Physically Abused: Not on file  . Sexually Abused: Not on file    Family  History  Problem Relation Age of Onset  . Stroke Mother   . Diabetes type II Mother   . Diabetes Mother   . Colon polyps Neg Hx   . Esophageal cancer Neg Hx   . Rectal cancer Neg Hx   . Stomach cancer Neg Hx      Vanessa Kick, MD 12/05/19 [phone]

## 2019-12-10 ENCOUNTER — Other Ambulatory Visit: Payer: Self-pay | Admitting: Family Medicine

## 2019-12-15 ENCOUNTER — Other Ambulatory Visit: Payer: Self-pay | Admitting: Family Medicine

## 2019-12-15 DIAGNOSIS — M5442 Lumbago with sciatica, left side: Secondary | ICD-10-CM

## 2019-12-15 DIAGNOSIS — G8929 Other chronic pain: Secondary | ICD-10-CM

## 2019-12-15 DIAGNOSIS — M25561 Pain in right knee: Secondary | ICD-10-CM

## 2019-12-15 DIAGNOSIS — Z1159 Encounter for screening for other viral diseases: Secondary | ICD-10-CM

## 2019-12-21 ENCOUNTER — Other Ambulatory Visit: Payer: Self-pay | Admitting: Family Medicine

## 2019-12-21 DIAGNOSIS — M25562 Pain in left knee: Secondary | ICD-10-CM

## 2019-12-21 DIAGNOSIS — M5442 Lumbago with sciatica, left side: Secondary | ICD-10-CM

## 2019-12-21 DIAGNOSIS — G8929 Other chronic pain: Secondary | ICD-10-CM

## 2019-12-21 DIAGNOSIS — Z1159 Encounter for screening for other viral diseases: Secondary | ICD-10-CM

## 2019-12-21 NOTE — Telephone Encounter (Signed)
Requested Prescriptions  Pending Prescriptions Disp Refills  . meloxicam (MOBIC) 15 MG tablet [Pharmacy Med Name: MELOXICAM 15 MG TABLET] 30 tablet     Sig: TAKE ONE TABLET BY MOUTH DAILY     Analgesics:  COX2 Inhibitors Failed - 12/21/2019 10:10 AM      Failed - Cr in normal range and within 360 days    Creat  Date Value Ref Range Status  03/14/2016 1.36 (H) 0.60 - 1.35 mg/dL Final   Creatinine, Ser  Date Value Ref Range Status  12/02/2019 1.27 (H) 0.61 - 1.24 mg/dL Final         Passed - HGB in normal range and within 360 days    Hemoglobin  Date Value Ref Range Status  12/02/2019 14.4 13.0 - 17.0 g/dL Final  10/25/2017 14.8 13.0 - 17.7 g/dL Final         Passed - Patient is not pregnant      Passed - Valid encounter within last 12 months    Recent Outpatient Visits          5 months ago Need for hepatitis C screening test   [place], [place], MD   7 months ago Muscle spasm   [place], Charlane Ferretti, MD   10 months ago Suspected sleep apnea   [place] [facility], Enobong, MD   1 year ago Incisional hernia, without obstruction or gangrene   [place], Enobong, MD   1 year ago Incisional hernia, without obstruction or gangrene   [facility] Charlott Rakes, MD

## 2020-01-04 ENCOUNTER — Encounter: Payer: Self-pay | Admitting: Family Medicine

## 2020-01-05 ENCOUNTER — Telehealth: Payer: Self-pay

## 2020-01-05 NOTE — Telephone Encounter (Signed)
Call placed to [facility] regarding CPAP order.  [facility] is now an [employer]. Spoke to Dimas Chyle who stated that the patient never contacted them with insurance information.  They do not need a new order, they just need him to call [employer] # [id number] to provide the insurance information and authorization for them to contact the insurance company.    Call placed to the patient and he said that he has insurance through his employer.  Shared the above information with him and repeated the phone number twice and he said he would call and provide the information needed.

## 2020-01-21 ENCOUNTER — Other Ambulatory Visit: Payer: Self-pay | Admitting: Family Medicine

## 2020-01-21 DIAGNOSIS — G8929 Other chronic pain: Secondary | ICD-10-CM

## 2020-01-21 DIAGNOSIS — M5442 Lumbago with sciatica, left side: Secondary | ICD-10-CM

## 2020-01-21 DIAGNOSIS — M25561 Pain in right knee: Secondary | ICD-10-CM

## 2020-01-21 DIAGNOSIS — Z1159 Encounter for screening for other viral diseases: Secondary | ICD-10-CM

## 2020-02-09 ENCOUNTER — Telehealth: Payer: Self-pay

## 2020-02-09 NOTE — Telephone Encounter (Signed)
Timothy Mccarthy is needing clarification on his Omeprazole, pt states that he is taking 2 pills.

## 2020-02-10 NOTE — Telephone Encounter (Signed)
He should be taking 40 mg daily.  80 mg is usually prescribed after an acute GI episode for short-term and subsequently tapered down to 40 mg daily.

## 2020-02-10 NOTE — Telephone Encounter (Signed)
Form has been faxed by to pharmacy with instruction on listed medication.

## 2020-02-16 ENCOUNTER — Other Ambulatory Visit: Payer: Self-pay

## 2020-02-16 ENCOUNTER — Encounter (HOSPITAL_COMMUNITY): Payer: Self-pay | Admitting: Emergency Medicine

## 2020-02-16 ENCOUNTER — Ambulatory Visit (HOSPITAL_COMMUNITY)
Admission: EM | Admit: 2020-02-16 | Discharge: 2020-02-16 | Disposition: A | Discharge status: Home / Self Care | Payer: Self-pay | Attending: Urgent Care | Admitting: Urgent Care

## 2020-02-16 DIAGNOSIS — R110 Nausea: Secondary | ICD-10-CM | POA: Insufficient documentation

## 2020-02-16 DIAGNOSIS — U071 COVID-19: Secondary | ICD-10-CM | POA: Insufficient documentation

## 2020-02-16 DIAGNOSIS — F17210 Nicotine dependence, cigarettes, uncomplicated: Secondary | ICD-10-CM | POA: Insufficient documentation

## 2020-02-16 DIAGNOSIS — R197 Diarrhea, unspecified: Secondary | ICD-10-CM | POA: Insufficient documentation

## 2020-02-16 DIAGNOSIS — R52 Pain, unspecified: Secondary | ICD-10-CM | POA: Insufficient documentation

## 2020-02-16 DIAGNOSIS — B349 Viral infection, unspecified: Secondary | ICD-10-CM

## 2020-02-16 HISTORY — DX: Sleep apnea, unspecified: G47.30

## 2020-02-16 LAB — POCT URINALYSIS DIPSTICK, ED / UC
Glucose, UA: NEGATIVE mg/dL
Hgb urine dipstick: NEGATIVE
Ketones, ur: NEGATIVE mg/dL
Leukocytes,Ua: NEGATIVE
Nitrite: NEGATIVE
Protein, ur: NEGATIVE mg/dL
Specific Gravity, Urine: 1.025 (ref 1.005–1.030)
Urobilinogen, UA: 0.2 mg/dL (ref 0.0–1.0)
pH: 6 (ref 5.0–8.0)

## 2020-02-16 LAB — RESP PANEL BY RT-PCR (FLU A&B, COVID) ARPGX2
Influenza A by PCR: NEGATIVE
Influenza B by PCR: NEGATIVE
SARS Coronavirus 2 by RT PCR: POSITIVE — AB

## 2020-02-16 MED ORDER — PROMETHAZINE-DM 6.25-15 MG/5ML PO SYRP: 5.0000 mL | ORAL_SOLUTION | Freq: Every evening | ORAL | 0 refills | Status: DC | PRN

## 2020-02-16 MED ORDER — ONDANSETRON 8 MG PO TBDP: 8.0000 mg | ORAL_TABLET | Freq: Three times a day (TID) | ORAL | 0 refills | Status: DC | PRN

## 2020-02-16 MED ORDER — BENZONATATE 100 MG PO CAPS: 100.0000 mg | ORAL_CAPSULE | Freq: Three times a day (TID) | ORAL | 0 refills | Status: DC | PRN

## 2020-02-16 MED ORDER — CETIRIZINE HCL 10 MG PO TABS: 10.0000 mg | ORAL_TABLET | Freq: Every day | ORAL | 0 refills | Status: DC

## 2020-02-16 MED ORDER — LOPERAMIDE HCL 2 MG PO CAPS: 2.0000 mg | ORAL_CAPSULE | Freq: Two times a day (BID) | ORAL | 0 refills | Status: DC | PRN

## 2020-02-16 NOTE — ED Triage Notes (Signed)
Patient c/o nausea, headaches, chills, generalized body aches, and diarrhea.   Patient is unaware of temperature status at home.  Patient endorses flank pain.  Patient endorses severe diarrhea, unknown amount of bowel movements.   Patient has taken Nyquil w/ no relief of symptoms.

## 2020-02-16 NOTE — Discharge Instructions (Signed)
We will notify you of your flu and COVID-19 test results as they arrive and may take between 24 to 48 hours.  I encourage you to sign up for MyChart if you have not already done so as this can be the easiest way for [place] to communicate results to you online or through a phone app.  In the meantime, if you develop worsening symptoms including fever, chest pain, shortness of breath despite our current treatment plan then please report to the emergency room as this may be a sign of worsening status from possible COVID-19 infection.  Otherwise, we will manage this as a viral syndrome. For sore throat or cough try using a honey-based tea. Use 3 teaspoons of honey with juice squeezed from half lemon. Place shaved pieces of ginger into 1/2-1 cup of water and warm over stove top. Then mix the ingredients and repeat every 4 hours as needed. Please take Tylenol 500mg -650mg  every 6 hours for aches and pains, fevers. Hydrate very well with at least 2 liters of water. Eat light meals such as soups to replenish electrolytes and soft fruits, veggies. Start an antihistamine like Zyrtec, Allegra or Claritin for postnasal drainage, sinus congestion.  You may use Zofran for your nausea and vomiting once every 8 hours.  Imodium can help with diarrhea but use this carefully limiting it to 1-2 times per day only if you are having a lot of diarrhea.  Please return to the clinic if symptoms worsen or you start having severe abdominal pain not helped by taking Tylenol or start having bloody stools or blood in the vomit.

## 2020-02-16 NOTE — ED Provider Notes (Signed)
[place]   MRN: [id number] DOB: 05-12-1970  Subjective:   KINTE GRIEVES is a 50 y.o. male presenting for 2 to 3-day history of acute onset malaise, fatigue, body aches, nausea, headaches, diarrhea.  Patient had one sick contact but does not know what type of infection he had.  He also has some flank pain on the left side.    Has not had COVID vaccination, flu vaccination.  Denies chest pain, shortness of breath.  Has a history of an appendectomy.  Denies history of lung disorders, asthma.  He used to be a smoker but quit a year ago.  No current facility-administered medications for this encounter.  Current Outpatient Medications:  .  amLODipine (NORVASC) 5 MG tablet, Take 1 tablet (5 mg total) by mouth daily., Disp: 90 tablet, Rfl: 3 .  atorvastatin (LIPITOR) 40 MG tablet, Take 1 tablet (40 mg total) by mouth daily., Disp: 90 tablet, Rfl: 1 .  busPIRone (BUSPAR) 15 MG tablet, Take 1 tablet (15 mg total) by mouth 2 (two) times daily., Disp: 180 tablet, Rfl: 1 .  cetirizine (ZYRTEC) 10 MG tablet, TAKE ONE TABLET BY MOUTH DAILY, Disp: 60 tablet, Rfl: 0 .  fluticasone (FLONASE) 50 MCG/ACT nasal spray, USE 2 SPRAY(S) IN EACH NOSTRIL DAILY, Disp: 16 g, Rfl: 2 .  hydrochlorothiazide (HYDRODIURIL) 25 MG tablet, Take 1 tablet (25 mg total) by mouth daily., Disp: 90 tablet, Rfl: 1 .  meloxicam (MOBIC) 15 MG tablet, TAKE ONE TABLET BY MOUTH DAILY, Disp: 30 tablet, Rfl: 0 .  methocarbamol (ROBAXIN) 500 MG tablet, Take 1 tablet (500 mg total) by mouth every 8 (eight) hours as needed for muscle spasms., Disp: 90 tablet, Rfl: 1 .  Multiple Vitamin (MULTIVITAMIN WITH MINERALS) TABS tablet, Take 2 tablets by mouth daily. , Disp: , Rfl:  .  Omega-3 Fatty Acids (FISH OIL OMEGA-3 PO), Take by mouth daily., Disp: , Rfl:  .  omeprazole (PRILOSEC) 40 MG capsule, Take 1 capsule (40 mg total) by mouth daily., Disp: 90 capsule, Rfl: 1 .  ondansetron (ZOFRAN-ODT) 4 MG disintegrating tablet, Take  1 tablet (4 mg total) by mouth every 8 (eight) hours as needed for nausea or vomiting., Disp: 15 tablet, Rfl: 0 .  zinc sulfate 220 (50 Zn) MG capsule, Take 220 mg by mouth daily., Disp: , Rfl:    Allergies  Allergen Reactions  . Penicillins Anaphylaxis    Has patient had a PCN reaction causing immediate rash, facial/tongue/throat swelling, SOB or lightheadedness with hypotensionNO Has patient had a PCN reaction causing severe rash involving mucus membranes or skin necrosis: NO Has patient had a PCN reaction that required hospitalization NO Has patient had a PCN reaction occurring within the last 10 years: NO If all of the above answers are "NO", then may proceed with Cephalosporin use.    Past Medical History:  Diagnosis Date  . Adjustment disorder with anxious mood   . Allergy   . Anxiety   . Arthritis    "hands" (09/28/2015)  . Depression   . GERD (gastroesophageal reflux disease)   . Hyperlipidemia   . Hypertension   . Sleep apnea    age 25   . Tobacco use disorder      Past Surgical History:  Procedure Laterality Date  . APPENDECTOMY  09/15/2015  . CARPAL TUNNEL RELEASE Bilateral   . INCISIONAL HERNIA REPAIR N/A 09/29/2015   Procedure: HERNIA REPAIR INCISIONAL;  Surgeon: Erroll Luna, MD;  Location: [facility];  Service: General;  Laterality: N/A;  . LAPAROSCOPIC APPENDECTOMY N/A 09/15/2015   Procedure: APPENDECTOMY LAPAROSCOPIC;  Surgeon: Judeth Horn, MD;  Location: [facility];  Service: General;  Laterality: N/A;  . LUMPS ON BACK  2013  . WISDOM TOOTH EXTRACTION      Family History  Problem Relation Age of Onset  . Stroke Mother   . Diabetes type II Mother   . Diabetes Mother   . Colon polyps Neg Hx   . Esophageal cancer Neg Hx   . Rectal cancer Neg Hx   . Stomach cancer Neg Hx     Social History   Tobacco Use  . Smoking status: Current Every Day Smoker    Packs/day: 0.50    Years: 23.00    Pack years: 11.50    Types: Cigarettes    Start date: 04/21/1987  .  Smokeless tobacco: Never Used  . Tobacco comment: Patches   Vaping Use  . Vaping Use: Never used  Substance Use Topics  . Alcohol use: Yes    Alcohol/week: 4.0 standard drinks    Types: 2 Cans of beer, 2 Shots of liquor per week    Comment: "takes shots whenever has pain"  . Drug use: No    ROS   Objective:   Vitals: BP (!) 137/93 (BP Location: Right Arm)   Pulse 94   Temp 98.6 F (37 C) (Oral)   Resp 20   Ht 5\' 7"  (1.702 m)   Wt 200 lb (90.7 kg)   SpO2 99%   BMI 31.32 kg/m   Physical Exam Constitutional:      General: He is not in acute distress.    Appearance: Normal appearance. He is well-developed and well-nourished. He is ill-appearing. He is not toxic-appearing or diaphoretic.  HENT:     Head: Normocephalic and atraumatic.     Right Ear: External ear normal.     Left Ear: External ear normal.     Nose: Nose normal.     Mouth/Throat:     Mouth: Oropharynx is clear and moist. Mucous membranes are moist.     Pharynx: Oropharynx is clear.  Eyes:     General: No scleral icterus.    Extraocular Movements: Extraocular movements intact.     Pupils: Pupils are equal, round, and reactive to light.  Cardiovascular:     Rate and Rhythm: Normal rate and regular rhythm.     Pulses: Intact distal pulses.     Heart sounds: Normal heart sounds. No murmur heard. No friction rub. No gallop.   Pulmonary:     Effort: Pulmonary effort is normal. No respiratory distress.     Breath sounds: Normal breath sounds. No stridor. No wheezing, rhonchi or rales.  Abdominal:     General: Bowel sounds are normal. There is no distension.     Palpations: Abdomen is soft. There is no mass.     Tenderness: There is abdominal tenderness (generalized). There is left CVA tenderness. There is no right CVA tenderness, guarding or rebound.  Skin:    General: Skin is warm and dry.  Neurological:     Mental Status: He is alert and oriented to person, place, and time.     Cranial Nerves: No  cranial nerve deficit.     Motor: No weakness.     Coordination: Coordination normal.     Gait: Gait normal.     Deep Tendon Reflexes: Reflexes normal.  Psychiatric:        Mood and Affect:  Mood and affect and mood normal.        Behavior: Behavior normal.        Thought Content: Thought content normal.        Judgment: Judgment normal.     Results for orders placed or performed during the hospital encounter of 02/16/20 (from the past 24 hour(s))  POC Urinalysis dipstick     Status: Abnormal   Collection Time: 02/16/20 12:22 PM  Result Value Ref Range   Glucose, UA NEGATIVE NEGATIVE mg/dL   Bilirubin Urine SMALL (A) NEGATIVE   Ketones, ur NEGATIVE NEGATIVE mg/dL   Specific Gravity, Urine 1.025 1.005 - 1.030   Hgb urine dipstick NEGATIVE NEGATIVE   pH 6.0 5.0 - 8.0   Protein, ur NEGATIVE NEGATIVE mg/dL   Urobilinogen, UA 0.2 0.0 - 1.0 mg/dL   Nitrite NEGATIVE NEGATIVE   Leukocytes,Ua NEGATIVE NEGATIVE    Assessment and Plan :   PDMP not reviewed this encounter.  1. Viral syndrome   2. Diarrhea, unspecified type   3. Nausea   4. Body aches     Will manage for viral illness such as viral URI, viral syndrome, influenza, COVID-19. Counseled patient on nature of COVID-19 including modes of transmission, diagnostic testing, management and supportive care.  Offered scripts for symptomatic relief. COVID 19 and influenza tests pending. Counseled patient on potential for adverse effects with medications prescribed/recommended today, ER and return-to-clinic precautions discussed, patient verbalized understanding.     Wallis Bamberg, PA-C 02/16/20 1234

## 2020-02-26 ENCOUNTER — Other Ambulatory Visit: Payer: Self-pay | Admitting: Family Medicine

## 2020-02-26 DIAGNOSIS — M5442 Lumbago with sciatica, left side: Secondary | ICD-10-CM

## 2020-02-26 DIAGNOSIS — G8929 Other chronic pain: Secondary | ICD-10-CM

## 2020-02-26 DIAGNOSIS — M25561 Pain in right knee: Secondary | ICD-10-CM

## 2020-02-26 DIAGNOSIS — Z1159 Encounter for screening for other viral diseases: Secondary | ICD-10-CM

## 2020-04-07 ENCOUNTER — Other Ambulatory Visit: Payer: Self-pay | Admitting: Family Medicine

## 2020-04-07 DIAGNOSIS — I10 Essential (primary) hypertension: Secondary | ICD-10-CM

## 2020-04-07 DIAGNOSIS — G8929 Other chronic pain: Secondary | ICD-10-CM

## 2020-04-07 DIAGNOSIS — Z1159 Encounter for screening for other viral diseases: Secondary | ICD-10-CM

## 2020-04-07 DIAGNOSIS — M5442 Lumbago with sciatica, left side: Secondary | ICD-10-CM

## 2020-04-07 DIAGNOSIS — M25561 Pain in right knee: Secondary | ICD-10-CM

## 2020-04-07 NOTE — Telephone Encounter (Signed)
Requested medication (s) are due for refill today: yes  Requested medication (s) are on the active medication list: yes   Future visit scheduled: no  Notes to clinic: Left vm for patient to callback  Overdue for appointment    Requested Prescriptions  Pending Prescriptions Disp Refills   hydrochlorothiazide (HYDRODIURIL) 25 MG tablet [Pharmacy Med Name: hydroCHLOROthiazide 25 MG TABLET] 90 tablet 1    Sig: TAKE ONE TABLET BY MOUTH DAILY      Cardiovascular: Diuretics - Thiazide Failed - 04/07/2020  1:51 PM      Failed - Cr in normal range and within 360 days    Creat  Date Value Ref Range Status  03/14/2016 1.36 (H) 0.60 - 1.35 mg/dL Final   Creatinine, Ser  Date Value Ref Range Status  12/02/2019 1.27 (H) 0.61 - 1.24 mg/dL Final          Failed - Last BP in normal range    BP Readings from Last 1 Encounters:  02/16/20 (!) 137/93          Failed - Valid encounter within last 6 months    Recent Outpatient Visits           8 months ago Need for hepatitis C screening test   [place], Charlane Ferretti, MD   11 months ago Muscle spasm   [place], Charlane Ferretti, MD   1 year ago Suspected sleep apnea   [place], Charlane Ferretti, MD   1 year ago Incisional hernia, without obstruction or gangrene   [place], Charlane Ferretti, MD   1 year ago Incisional hernia, without obstruction or gangrene   [place], Charlane Ferretti, MD                Passed - Ca in normal range and within 360 days    Calcium  Date Value Ref Range Status  12/02/2019 9.4 8.9 - 10.3 mg/dL Final          Passed - K in normal range and within 360 days    Potassium  Date Value Ref Range Status  12/02/2019 3.5 3.5 - 5.1 mmol/L Final          Passed - Na in normal range and within 360 days    Sodium  Date Value Ref Range Status   12/02/2019 140 135 - 145 mmol/L Final  07/23/2019 140 134 - 144 mmol/L Final            meloxicam (MOBIC) 15 MG tablet [Pharmacy Med Name: MELOXICAM 15 MG TABLET] 30 tablet 0    Sig: TAKE ONE TABLET BY MOUTH DAILY      Analgesics:  COX2 Inhibitors Failed - 04/07/2020  1:51 PM      Failed - Cr in normal range and within 360 days    Creat  Date Value Ref Range Status  03/14/2016 1.36 (H) 0.60 - 1.35 mg/dL Final   Creatinine, Ser  Date Value Ref Range Status  12/02/2019 1.27 (H) 0.61 - 1.24 mg/dL Final          Passed - HGB in normal range and within 360 days    Hemoglobin  Date Value Ref Range Status  12/02/2019 14.4 13.0 - 17.0 g/dL Final  10/25/2017 14.8 13.0 - 17.7 g/dL Final          Passed - Patient is not pregnant  Passed - Valid encounter within last 12 months    Recent Outpatient Visits           8 months ago Need for hepatitis C screening test   [place], Enobong, MD   11 months ago Muscle spasm   [place], Enobong, MD   1 year ago Suspected sleep apnea   [place], Enobong, MD   1 year ago Incisional hernia, without obstruction or gangrene   [place], Enobong, MD   1 year ago Incisional hernia, without obstruction or gangrene   [facility] Charlott Rakes, MD

## 2020-04-08 ENCOUNTER — Other Ambulatory Visit: Payer: Self-pay | Admitting: Family Medicine

## 2020-04-08 ENCOUNTER — Other Ambulatory Visit: Payer: Self-pay

## 2020-04-08 DIAGNOSIS — M5442 Lumbago with sciatica, left side: Secondary | ICD-10-CM

## 2020-04-08 DIAGNOSIS — G8929 Other chronic pain: Secondary | ICD-10-CM

## 2020-04-08 DIAGNOSIS — Z1159 Encounter for screening for other viral diseases: Secondary | ICD-10-CM

## 2020-04-08 DIAGNOSIS — E7800 Pure hypercholesterolemia, unspecified: Secondary | ICD-10-CM

## 2020-04-08 DIAGNOSIS — M25561 Pain in right knee: Secondary | ICD-10-CM

## 2020-04-08 MED ORDER — ATORVASTATIN CALCIUM 40 MG PO TABS: 40.0000 mg | ORAL_TABLET | Freq: Every day | ORAL | 0 refills | Status: DC

## 2020-04-08 MED ORDER — CETIRIZINE HCL 10 MG PO TABS: 10.0000 mg | ORAL_TABLET | Freq: Every day | ORAL | 0 refills | Status: DC

## 2020-04-08 NOTE — Telephone Encounter (Signed)
Medications has been refilled.

## 2020-04-08 NOTE — Addendum Note (Signed)
Addended by: Valli Glance F on: 04/08/2020 12:23 PM   Modules accepted: Orders

## 2020-04-08 NOTE — Telephone Encounter (Signed)
Requested medication (s) are due for refill today- -yes  Requested medication (s) are on the active medication list -yes  Future visit scheduled -yes  Last refill: 1 month ago  Notes to clinic: Request refill of medication prescribed by outside provider  Requested Prescriptions  Pending Prescriptions Disp Refills   cetirizine (ZYRTEC ALLERGY) 10 MG tablet 30 tablet 0    Sig: Take 1 tablet (10 mg total) by mouth daily.      Ear, Nose, and Throat:  Antihistamines Passed - 04/08/2020 12:23 PM      Passed - Valid encounter within last 12 months    Recent Outpatient Visits           8 months ago Need for hepatitis C screening test   [place], Enobong, MD   11 months ago Muscle spasm   [place], Enobong, MD   1 year ago Suspected sleep apnea   [place], Enobong, MD   1 year ago Incisional hernia, without obstruction or gangrene   [place], Enobong, MD   1 year ago Incisional hernia, without obstruction or gangrene   [place], Charlane Ferretti, MD       Future Appointments             In 1 month Charlott Rakes, MD [place]              Signed Prescriptions Disp Refills   atorvastatin (LIPITOR) 40 MG tablet 90 tablet 0    Sig: Take 1 tablet (40 mg total) by mouth daily.      Cardiovascular:  Antilipid - Statins Failed - 04/08/2020 12:23 PM      Failed - LDL in normal range and within 360 days    LDL Chol Calc (NIH)  Date Value Ref Range Status  07/23/2019 35 0 - 99 mg/dL Final          Failed - HDL in normal range and within 360 days    HDL  Date Value Ref Range Status  07/23/2019 26 (L) >39 mg/dL Final          Failed - Triglycerides in normal range and within 360 days    Triglycerides  Date Value Ref Range Status  07/23/2019 514 (H) 0 -  149 mg/dL Final          Passed - Total Cholesterol in normal range and within 360 days    Cholesterol, Total  Date Value Ref Range Status  07/23/2019 133 100 - 199 mg/dL Final          Passed - Patient is not pregnant      Passed - Valid encounter within last 12 months    Recent Outpatient Visits           8 months ago Need for hepatitis C screening test   [place], Enobong, MD   11 months ago Muscle spasm   [place], Enobong, MD   1 year ago Suspected sleep apnea   [place], Enobong, MD   1 year ago Incisional hernia, without obstruction or gangrene   [place], Enobong, MD   1 year ago Incisional hernia, without obstruction or gangrene   [place]  Charlott Rakes, MD       Future Appointments             In 1 month Charlott Rakes, MD [place]              Refused Prescriptions Disp Refills   atorvastatin (LIPITOR) 20 MG tablet [Pharmacy Med Name: ATORVASTATIN 20 MG TABLET] 90 tablet 1    Sig: TAKE ONE TABLET BY MOUTH DAILY      Cardiovascular:  Antilipid - Statins Failed - 04/08/2020 12:23 PM      Failed - LDL in normal range and within 360 days    LDL Chol Calc (NIH)  Date Value Ref Range Status  07/23/2019 35 0 - 99 mg/dL Final          Failed - HDL in normal range and within 360 days    HDL  Date Value Ref Range Status  07/23/2019 26 (L) >39 mg/dL Final          Failed - Triglycerides in normal range and within 360 days    Triglycerides  Date Value Ref Range Status  07/23/2019 514 (H) 0 - 149 mg/dL Final          Passed - Total Cholesterol in normal range and within 360 days    Cholesterol, Total  Date Value Ref Range Status  07/23/2019 133 100 - 199 mg/dL Final          Passed - Patient is not pregnant      Passed  - Valid encounter within last 12 months    Recent Outpatient Visits           8 months ago Need for hepatitis C screening test   [place], Enobong, MD   11 months ago Muscle spasm   [place], Enobong, MD   1 year ago Suspected sleep apnea   [place], Enobong, MD   1 year ago Incisional hernia, without obstruction or gangrene   [place], Enobong, MD   1 year ago Incisional hernia, without obstruction or gangrene   [place], Charlane Ferretti, MD       Future Appointments             In 1 month Charlott Rakes, MD [place]               meloxicam (MOBIC) 15 MG tablet 30 tablet 0    Sig: Take 1 tablet (15 mg total) by mouth daily.      Analgesics:  COX2 Inhibitors Failed - 04/08/2020 12:23 PM      Failed - Cr in normal range and within 360 days    Creat  Date Value Ref Range Status  03/14/2016 1.36 (H) 0.60 - 1.35 mg/dL Final   Creatinine, Ser  Date Value Ref Range Status  12/02/2019 1.27 (H) 0.61 - 1.24 mg/dL Final          Passed - HGB in normal range and within 360 days    Hemoglobin  Date Value Ref Range Status  12/02/2019 14.4 13.0 - 17.0 g/dL Final  10/25/2017 14.8 13.0 - 17.7 g/dL Final          Passed - Patient is not pregnant      Passed - Valid encounter within last 12 months    Recent  Outpatient Visits           8 months ago Need for hepatitis C screening test   [place], Enobong, MD   11 months ago Muscle spasm   [place], Enobong, MD   1 year ago Suspected sleep apnea   [place], Enobong, MD   1 year ago Incisional hernia, without obstruction or gangrene   [place],  Enobong, MD   1 year ago Incisional hernia, without obstruction or gangrene   [place], Enobong, MD       Future Appointments             In 1 month Charlott Rakes, MD [place]                 Requested Prescriptions  Pending Prescriptions Disp Refills   cetirizine (ZYRTEC ALLERGY) 10 MG tablet 30 tablet 0    Sig: Take 1 tablet (10 mg total) by mouth daily.      Ear, Nose, and Throat:  Antihistamines Passed - 04/08/2020 12:23 PM      Passed - Valid encounter within last 12 months    Recent Outpatient Visits           8 months ago Need for hepatitis C screening test   [place], Enobong, MD   11 months ago Muscle spasm   [place], Enobong, MD   1 year ago Suspected sleep apnea   [place], Enobong, MD   1 year ago Incisional hernia, without obstruction or gangrene   [place], Enobong, MD   1 year ago Incisional hernia, without obstruction or gangrene   [place], Charlane Ferretti, MD       Future Appointments             In 1 month Charlott Rakes, MD [place]              Signed Prescriptions Disp Refills   atorvastatin (LIPITOR) 40 MG tablet 90 tablet 0    Sig: Take 1 tablet (40 mg total) by mouth daily.      Cardiovascular:  Antilipid - Statins Failed - 04/08/2020 12:23 PM      Failed - LDL in normal range and within 360 days    LDL Chol Calc (NIH)  Date Value Ref Range Status  07/23/2019 35 0 - 99 mg/dL Final          Failed - HDL in normal range and within 360 days    HDL  Date Value Ref Range Status  07/23/2019 26 (L) >39 mg/dL Final          Failed - Triglycerides in normal range and within 360 days    Triglycerides  Date Value Ref Range Status   07/23/2019 514 (H) 0 - 149 mg/dL Final          Passed - Total Cholesterol in normal range and within 360 days    Cholesterol, Total  Date Value Ref Range Status  07/23/2019 133 100 - 199 mg/dL Final          Passed - Patient is not pregnant      Passed - Valid encounter  within last 12 months    Recent Outpatient Visits           8 months ago Need for hepatitis C screening test   [place], Charlane Ferretti, MD   11 months ago Muscle spasm   [place], Charlane Ferretti, MD   1 year ago Suspected sleep apnea   [place], Charlane Ferretti, MD   1 year ago Incisional hernia, without obstruction or gangrene   [place], Charlane Ferretti, MD   1 year ago Incisional hernia, without obstruction or gangrene   [place], Charlane Ferretti, MD       Future Appointments             In 1 month Charlott Rakes, MD [place]              Refused Prescriptions Disp Refills   atorvastatin (LIPITOR) 20 MG tablet [Pharmacy Med Name: ATORVASTATIN 20 MG TABLET] 90 tablet 1    Sig: TAKE ONE TABLET BY MOUTH DAILY      Cardiovascular:  Antilipid - Statins Failed - 04/08/2020 12:23 PM      Failed - LDL in normal range and within 360 days    LDL Chol Calc (NIH)  Date Value Ref Range Status  07/23/2019 35 0 - 99 mg/dL Final          Failed - HDL in normal range and within 360 days    HDL  Date Value Ref Range Status  07/23/2019 26 (L) >39 mg/dL Final          Failed - Triglycerides in normal range and within 360 days    Triglycerides  Date Value Ref Range Status  07/23/2019 514 (H) 0 - 149 mg/dL Final          Passed - Total Cholesterol in normal range and within 360 days    Cholesterol, Total  Date Value Ref Range Status  07/23/2019 133 100 - 199 mg/dL Final          Passed - Patient is  not pregnant      Passed - Valid encounter within last 12 months    Recent Outpatient Visits           8 months ago Need for hepatitis C screening test   [place], Enobong, MD   11 months ago Muscle spasm   [place], Enobong, MD   1 year ago Suspected sleep apnea   [place], Enobong, MD   1 year ago Incisional hernia, without obstruction or gangrene   [place], Enobong, MD   1 year ago Incisional hernia, without obstruction or gangrene   [place], Charlane Ferretti, MD       Future Appointments             In 1 month Charlott Rakes, MD [place]               meloxicam (MOBIC) 15 MG tablet 30 tablet 0    Sig: Take 1 tablet (15 mg total) by mouth daily.      Analgesics:  COX2 Inhibitors Failed - 04/08/2020 12:23 PM      Failed - Cr in normal range and  within 360 days    Creat  Date Value Ref Range Status  03/14/2016 1.36 (H) 0.60 - 1.35 mg/dL Final   Creatinine, Ser  Date Value Ref Range Status  12/02/2019 1.27 (H) 0.61 - 1.24 mg/dL Final          Passed - HGB in normal range and within 360 days    Hemoglobin  Date Value Ref Range Status  12/02/2019 14.4 13.0 - 17.0 g/dL Final  10/25/2017 14.8 13.0 - 17.7 g/dL Final          Passed - Patient is not pregnant      Passed - Valid encounter within last 12 months    Recent Outpatient Visits           8 months ago Need for hepatitis C screening test   [place], Enobong, MD   11 months ago Muscle spasm   [place], Enobong, MD   1 year ago Suspected sleep apnea   [place], Enobong, MD   1 year ago Incisional hernia, without obstruction or gangrene   [place], Enobong, MD   1 year ago Incisional hernia, without obstruction or gangrene   [place], Enobong, MD       Future Appointments             In 1 month Charlott Rakes, MD [place]

## 2020-04-08 NOTE — Telephone Encounter (Signed)
Pt called stating that he is completely out of this medication and is requesting to have it sent in today. Please advise.

## 2020-04-08 NOTE — Telephone Encounter (Signed)
Medication Refill - Medication: meloxicam (MOBIC) 15 MG tablet  atorvastatin (LIPITOR) 40 MG tablet  cetirizine (ZYRTEC ALLERGY) 10 MG tablet  cetirizine (ZYRTEC ALLERGY) 10 MG tablet Pt has next available appt in April scheduled but needs refills asap / pt is out of meds  /please advise   Has the patient contacted their pharmacy? Yes.   (Agent: If no, request that the patient contact the pharmacy for the refill.) (Agent: If yes, when and what did the pharmacy advise?)call pcp / no response   Preferred Pharmacy (with phone number or street name):  [facility] [address] [place]  [address] [place], [place] [address]  Phone:  [phone] Fax:  [phone]

## 2020-05-04 ENCOUNTER — Other Ambulatory Visit: Payer: Self-pay | Admitting: Family Medicine

## 2020-05-04 DIAGNOSIS — Z1159 Encounter for screening for other viral diseases: Secondary | ICD-10-CM

## 2020-05-04 DIAGNOSIS — G8929 Other chronic pain: Secondary | ICD-10-CM

## 2020-05-04 DIAGNOSIS — M5442 Lumbago with sciatica, left side: Secondary | ICD-10-CM

## 2020-05-04 NOTE — Telephone Encounter (Signed)
Requested Prescriptions  Pending Prescriptions Disp Refills  . meloxicam (MOBIC) 15 MG tablet [Pharmacy Med Name: MELOXICAM 15 MG TABLET] 30 tablet 0    Sig: TAKE ONE TABLET BY MOUTH DAILY     Analgesics:  COX2 Inhibitors Failed - 05/04/2020  9:35 PM      Failed - Cr in normal range and within 360 days    Creat  Date Value Ref Range Status  03/14/2016 1.36 (H) 0.60 - 1.35 mg/dL Final   Creatinine, Ser  Date Value Ref Range Status  12/02/2019 1.27 (H) 0.61 - 1.24 mg/dL Final         Passed - HGB in normal range and within 360 days    Hemoglobin  Date Value Ref Range Status  12/02/2019 14.4 13.0 - 17.0 g/dL Final  10/25/2017 14.8 13.0 - 17.7 g/dL Final         Passed - Patient is not pregnant      Passed - Valid encounter within last 12 months    Recent Outpatient Visits          9 months ago Need for hepatitis C screening test   [place], Enobong, MD   1 year ago Muscle spasm   [place], Enobong, MD   1 year ago Suspected sleep apnea   [place], Enobong, MD   1 year ago Incisional hernia, without obstruction or gangrene   [place], Enobong, MD   1 year ago Incisional hernia, without obstruction or gangrene   [place], Enobong, MD      Future Appointments            In 4 weeks Charlott Rakes, MD [place]           . cetirizine (ZYRTEC) 10 MG tablet [Pharmacy Med Name: CETIRIZINE HCL 10 MG TABLET] 30 tablet 0    Sig: TAKE ONE TABLET BY MOUTH DAILY     Ear, Nose, and Throat:  Antihistamines Passed - 05/04/2020  9:35 PM      Passed - Valid encounter within last 12 months    Recent Outpatient Visits          9 months ago Need for hepatitis C screening test   [place], Enobong, MD   1 year ago  Muscle spasm   [place], Enobong, MD   1 year ago Suspected sleep apnea   [place], Enobong, MD   1 year ago Incisional hernia, without obstruction or gangrene   [place], Enobong, MD   1 year ago Incisional hernia, without obstruction or gangrene   [place], Enobong, MD      Future Appointments            In 4 weeks Charlott Rakes, MD [place]

## 2020-05-19 ENCOUNTER — Other Ambulatory Visit: Payer: Self-pay | Admitting: Family Medicine

## 2020-05-19 NOTE — Telephone Encounter (Signed)
Requested Prescriptions  Pending Prescriptions Disp Refills  . omeprazole (PRILOSEC) 40 MG capsule [Pharmacy Med Name: OMEPRAZOLE DR 40 MG CAPSULE] 90 capsule 1    Sig: TAKE ONE CAPSULE BY MOUTH DAILY     Gastroenterology: Proton Pump Inhibitors Passed - 05/19/2020  5:39 PM      Passed - Valid encounter within last 12 months    Recent Outpatient Visits          10 months ago Need for hepatitis C screening test   [place], Enobong, MD   1 year ago Muscle spasm   [place], Enobong, MD   1 year ago Suspected sleep apnea   [place], Enobong, MD   1 year ago Incisional hernia, without obstruction or gangrene   [place], Enobong, MD   1 year ago Incisional hernia, without obstruction or gangrene   [place], MD      Future Appointments            In 1 week Charlott Rakes, MD [place]

## 2020-06-01 ENCOUNTER — Encounter: Payer: Self-pay | Admitting: Family Medicine

## 2020-06-01 ENCOUNTER — Ambulatory Visit: Payer: Self-pay | Attending: Family Medicine | Admitting: Family Medicine

## 2020-06-01 ENCOUNTER — Other Ambulatory Visit: Payer: Self-pay

## 2020-06-01 VITALS — BP 141/92 | HR 89 | Resp 20 | Ht 67.0 in | Wt 205.6 lb

## 2020-06-01 DIAGNOSIS — E7800 Pure hypercholesterolemia, unspecified: Secondary | ICD-10-CM

## 2020-06-01 DIAGNOSIS — R1033 Periumbilical pain: Secondary | ICD-10-CM

## 2020-06-01 DIAGNOSIS — F32A Depression, unspecified: Secondary | ICD-10-CM

## 2020-06-01 DIAGNOSIS — G8929 Other chronic pain: Secondary | ICD-10-CM

## 2020-06-01 DIAGNOSIS — F419 Anxiety disorder, unspecified: Secondary | ICD-10-CM

## 2020-06-01 DIAGNOSIS — M62838 Other muscle spasm: Secondary | ICD-10-CM

## 2020-06-01 DIAGNOSIS — M5442 Lumbago with sciatica, left side: Secondary | ICD-10-CM

## 2020-06-01 DIAGNOSIS — I10 Essential (primary) hypertension: Secondary | ICD-10-CM

## 2020-06-01 DIAGNOSIS — R112 Nausea with vomiting, unspecified: Secondary | ICD-10-CM

## 2020-06-01 MED ORDER — MELOXICAM 15 MG PO TABS: 1.0000 | ORAL_TABLET | Freq: Every day | ORAL | 1 refills | Status: DC

## 2020-06-01 MED ORDER — AMLODIPINE BESYLATE 5 MG PO TABS: 5.0000 mg | ORAL_TABLET | Freq: Every day | ORAL | 1 refills | Status: DC

## 2020-06-01 MED ORDER — BUSPIRONE HCL 15 MG PO TABS: 15.0000 mg | ORAL_TABLET | Freq: Two times a day (BID) | ORAL | 1 refills | Status: DC

## 2020-06-01 MED ORDER — HYDROCHLOROTHIAZIDE 25 MG PO TABS
1.0000 | ORAL_TABLET | Freq: Every day | ORAL | 1 refills | Status: DC
Start: 2020-06-01 — End: 2020-12-19

## 2020-06-01 MED ORDER — ONDANSETRON 8 MG PO TBDP: 8.0000 mg | ORAL_TABLET | Freq: Three times a day (TID) | ORAL | 0 refills | Status: DC | PRN

## 2020-06-01 MED ORDER — ATORVASTATIN CALCIUM 40 MG PO TABS
40.0000 mg | ORAL_TABLET | Freq: Every day | ORAL | 1 refills | Status: DC
Start: 2020-06-01 — End: 2020-12-19

## 2020-06-01 NOTE — Patient Instructions (Signed)
https://[email or web address]">  Chronic Back Pain When back pain lasts longer than 3 months, it is called chronic back pain. The cause of your back pain may not be known. Some common causes include:  Wear and tear (degenerative disease) of the bones, ligaments, or disks in your back.  Inflammation and stiffness in your back (arthritis). People who have chronic back pain often go through certain periods in which the pain is more intense (flare-ups). Many people can learn to manage the pain with home care. Follow these instructions at home: Pay attention to any changes in your symptoms. Take these actions to help with your pain: Managing pain and stiffness  If directed, apply ice to the painful area. Your health care provider may recommend applying ice during the first 24-48 hours after a flare-up begins. To do this: ? Put ice in a plastic bag. ? Place a towel between your skin and the bag. ? Leave the ice on for 20 minutes, 2-3 times per day.  If directed, apply heat to the affected area as often as told by your health care provider. Use the heat source that your health care provider recommends, such as a moist heat pack or a heating pad. ? Place a towel between your skin and the heat source. ? Leave the heat on for 20-30 minutes. ? Remove the heat if your skin turns bright red. This is especially important if you are unable to feel pain, heat, or cold. You may have a greater risk of getting burned.  Try soaking in a warm tub.      Activity  Avoid bending and other activities that make the problem worse.  Maintain a proper position when standing or sitting: ? When standing, keep your upper back and neck straight, with your shoulders pulled back. Avoid slouching. ? When sitting, keep your back straight and relax your shoulders. Do not round your shoulders or pull them backward.  Do not sit or stand in one place for long periods of time.  Take brief periods of rest  throughout the day. This will reduce your pain. Resting in a lying or standing position is usually better than sitting to rest.  When you are resting for longer periods, mix in some mild activity or stretching between periods of rest. This will help to prevent stiffness and pain.  Get regular exercise. Ask your health care provider what activities are safe for you.  Do not lift anything that is heavier than 10 lb (4.5 kg), or the limit that you are told, until your health care provider says that it is safe. Always use proper lifting technique, which includes: ? Bending your knees. ? Keeping the load close to your body. ? Avoiding twisting.  Sleep on a firm mattress in a comfortable position. Try lying on your side with your knees slightly bent. If you lie on your back, put a pillow under your knees.   Medicines  Treatment may include medicines for pain and inflammation taken by mouth or applied to the skin, prescription pain medicine, or muscle relaxants. Take over-the-counter and prescription medicines only as told by your health care provider.  Ask your health care provider if the medicine prescribed to you: ? Requires you to avoid driving or using machinery. ? Can cause constipation. You may need to take these actions to prevent or treat constipation:  Drink enough fluid to keep your urine pale yellow.  Take over-the-counter or prescription medicines.  Eat foods that are high in fiber, such as   beans, whole grains, and fresh fruits and vegetables.  Limit foods that are high in fat and processed sugars, such as fried or sweet foods. General instructions  Do not use any products that contain nicotine or tobacco, such as cigarettes, e-cigarettes, and chewing tobacco. If you need help quitting, ask your health care provider.  Keep all follow-up visits as told by your health care provider. This is important. Contact a health care provider if:  You have pain that is not relieved with  rest or medicine.  Your pain gets worse, or you have new pain.  You have a high fever.  You have rapid weight loss.  You have trouble doing your normal activities. Get help right away if:  You have weakness or numbness in one or both of your legs or feet.  You have trouble controlling your bladder or your bowels.  You have severe back pain and have any of the following: ? Nausea or vomiting. ? Pain in your abdomen. ? Shortness of breath or you faint. Summary  Chronic back pain is back pain that lasts longer than 3 months.  When a flare-up begins, apply ice to the painful area for the first 24-48 hours.  Apply a moist heat pad or use a heating pad on the painful area as directed by your health care provider.  When you are resting for longer periods, mix in some mild activity or stretching between periods of rest. This will help to prevent stiffness and pain. This information is not intended to replace advice given to you by your health care provider. Make sure you discuss any questions you have with your health care provider. Document Revised: 03/11/2019 Document Reviewed: 03/11/2019 Elsevier Patient Education  2021 Elsevier Inc.  

## 2020-06-01 NOTE — Progress Notes (Signed)
Subjective:  Patient ID: Timothy Mccarthy, male    DOB: 1970-07-02  Age: 50 y.o. MRN: [id number]  CC: Abdominal Pain   HPI Timothy Mccarthy is aL handed60 year old male with a history of hypertension, GERD, anxiety, Osteoarthritis of the knee , recent laparoscopic umbilical hernia repair at [facility] here for a follow up visit. Complains of mid abdominal pain where his mesh is and last night it kept him up at night. Endorses presence of nausea and vomiting and he has a hard time keeping food down, also endorses presence of daily satiety.  Symptoms occur after meals and he has been compliant with his omeprazole.  Back pain is chronic, rated at 8/10 at the moment. He ran out of meds a while ago and has muscle spasms in his back as well.  Also on Robaxin and meloxicam which did help with his pain. Compliant with his antihypertensive.  Anxiety and depression are controlled. He has no chest pain or dyspnea. Past Medical History:  Diagnosis Date  . Adjustment disorder with anxious mood   . Allergy   . Anxiety   . Arthritis    "hands" (09/28/2015)  . Depression   . GERD (gastroesophageal reflux disease)   . Hyperlipidemia   . Hypertension   . Sleep apnea    age 87   . Tobacco use disorder     Past Surgical History:  Procedure Laterality Date  . APPENDECTOMY  09/15/2015  . CARPAL TUNNEL RELEASE Bilateral   . INCISIONAL HERNIA REPAIR N/A 09/29/2015   Procedure: HERNIA REPAIR INCISIONAL;  Surgeon: Erroll Luna, MD;  Location: [facility];  Service: General;  Laterality: N/A;  . LAPAROSCOPIC APPENDECTOMY N/A 09/15/2015   Procedure: APPENDECTOMY LAPAROSCOPIC;  Surgeon: Judeth Horn, MD;  Location: [facility];  Service: General;  Laterality: N/A;  . LUMPS ON BACK  2013  . WISDOM TOOTH EXTRACTION      Family History  Problem Relation Age of Onset  . Stroke Mother   . Diabetes type II Mother   . Diabetes Mother   . Colon polyps Neg Hx   . Esophageal cancer Neg Hx   . Rectal cancer Neg Hx   . Stomach  cancer Neg Hx     Allergies  Allergen Reactions  . Penicillins Anaphylaxis    Has patient had a PCN reaction causing immediate rash, facial/tongue/throat swelling, SOB or lightheadedness with hypotensionNO Has patient had a PCN reaction causing severe rash involving mucus membranes or skin necrosis: NO Has patient had a PCN reaction that required hospitalization NO Has patient had a PCN reaction occurring within the last 10 years: NO If all of the above answers are "NO", then may proceed with Cephalosporin use.    Outpatient Medications Prior to Visit  Medication Sig Dispense Refill  . cetirizine (ZYRTEC) 10 MG tablet TAKE ONE TABLET BY MOUTH DAILY 30 tablet 0  . fluticasone (FLONASE) 50 MCG/ACT nasal spray USE 2 SPRAY(S) IN EACH NOSTRIL DAILY 16 g 2  . methocarbamol (ROBAXIN) 500 MG tablet Take 1 tablet (500 mg total) by mouth every 8 (eight) hours as needed for muscle spasms. 90 tablet 1  . Multiple Vitamin (MULTIVITAMIN WITH MINERALS) TABS tablet Take 2 tablets by mouth daily.     . Omega-3 Fatty Acids (FISH OIL OMEGA-3 PO) Take by mouth daily.    Marland Kitchen omeprazole (PRILOSEC) 40 MG capsule TAKE ONE CAPSULE BY MOUTH DAILY 90 capsule 1  . amLODipine (NORVASC) 5 MG tablet Take 1 tablet (5 mg total) by  mouth daily. 90 tablet 3  . atorvastatin (LIPITOR) 40 MG tablet Take 1 tablet (40 mg total) by mouth daily. 90 tablet 0  . busPIRone (BUSPAR) 15 MG tablet Take 1 tablet (15 mg total) by mouth 2 (two) times daily. 180 tablet 1  . hydrochlorothiazide (HYDRODIURIL) 25 MG tablet TAKE ONE TABLET BY MOUTH DAILY 90 tablet 0  . meloxicam (MOBIC) 15 MG tablet TAKE ONE TABLET BY MOUTH DAILY 30 tablet 0  . benzonatate (TESSALON) 100 MG capsule Take 1-2 capsules (100-200 mg total) by mouth 3 (three) times daily as needed. (Patient not taking: Reported on 06/01/2020) 60 capsule 0  . loperamide (IMODIUM) 2 MG capsule Take 1 capsule (2 mg total) by mouth 2 (two) times daily as needed for diarrhea or loose stools.  (Patient not taking: Reported on 06/01/2020) 14 capsule 0  . promethazine-dextromethorphan (PROMETHAZINE-DM) 6.25-15 MG/5ML syrup Take 5 mLs by mouth at bedtime as needed for cough. (Patient not taking: Reported on 06/01/2020) 100 mL 0  . zinc sulfate 220 (50 Zn) MG capsule Take 220 mg by mouth daily. (Patient not taking: Reported on 06/01/2020)    . ondansetron (ZOFRAN-ODT) 8 MG disintegrating tablet Take 1 tablet (8 mg total) by mouth every 8 (eight) hours as needed for nausea or vomiting. (Patient not taking: Reported on 06/01/2020) 20 tablet 0   No facility-administered medications prior to visit.     ROS Review of Systems  Constitutional: Negative for activity change and appetite change.  HENT: Negative for sinus pressure and sore throat.   Eyes: Negative for visual disturbance.  Respiratory: Negative for cough, chest tightness and shortness of breath.   Cardiovascular: Negative for chest pain and leg swelling.  Gastrointestinal: Positive for abdominal pain, nausea and vomiting. Negative for abdominal distention, constipation and diarrhea.  Endocrine: Negative.   Genitourinary: Negative for dysuria.  Musculoskeletal: Positive for back pain. Negative for joint swelling and myalgias.  Skin: Negative for rash.  Allergic/Immunologic: Negative.   Neurological: Negative for weakness, light-headedness and numbness.  Psychiatric/Behavioral: Negative for dysphoric mood and suicidal ideas.    Objective:  BP (!) 141/92   Pulse 89   Resp 20   Ht '5\' 7"'  (1.702 m)   Wt 205 lb 9.6 oz (93.3 kg)   SpO2 95%   BMI 32.20 kg/m   BP/Weight 06/01/2020 02/16/2020 31/54/0086  Systolic BP 761 950 932  Diastolic BP 92 93 98  Wt. (Lbs) 205.6 200 -  BMI 32.2 31.32 -      Physical Exam Constitutional:      Appearance: He is well-developed.  Neck:     Vascular: No JVD.  Cardiovascular:     Rate and Rhythm: Normal rate.     Heart sounds: Normal heart sounds. No murmur heard.   Pulmonary:      Effort: Pulmonary effort is normal.     Breath sounds: Normal breath sounds. No wheezing or rales.  Chest:     Chest wall: No tenderness.  Abdominal:     General: Bowel sounds are normal. There is no distension.     Palpations: Abdomen is soft. There is no mass.     Tenderness: There is no abdominal tenderness.  Musculoskeletal:        General: Normal range of motion.     Right lower leg: No edema.     Left lower leg: No edema.  Neurological:     Mental Status: He is alert and oriented to person, place, and time.  Psychiatric:  Mood and Affect: Mood normal.     CMP Latest Ref Rng & Units 06/01/2020 12/02/2019 07/23/2019  Glucose 65 - 99 mg/dL 90 90 97  BUN 6 - 24 mg/dL '10 11 10  ' Creatinine 0.76 - 1.27 mg/dL 0.90 1.27(H) 1.05  Sodium 134 - 144 mmol/L 142 140 140  Potassium 3.5 - 5.2 mmol/L 3.8 3.5 4.2  Chloride 96 - 106 mmol/L 101 101 98  CO2 20 - 29 mmol/L '25 27 25  ' Calcium 8.7 - 10.2 mg/dL 9.4 9.4 9.7  Total Protein 6.0 - 8.5 g/dL 6.7 7.2 7.1  Total Bilirubin 0.0 - 1.2 mg/dL <0.2 0.4 0.3  Alkaline Phos 44 - 121 IU/L 105 85 113  AST 0 - 40 IU/L '28 30 25  ' ALT 0 - 44 IU/L 39 35 26    Lipid Panel     Component Value Date/Time   CHOL 133 07/23/2019 0950   TRIG 514 (H) 07/23/2019 0950   HDL 26 (L) 07/23/2019 0950   CHOLHDL 5.1 (H) 07/23/2019 0950   LDLCALC 35 07/23/2019 0950    CBC    Component Value Date/Time   WBC 15.4 (H) 12/02/2019 2011   RBC 5.27 12/02/2019 2011   HGB 14.4 12/02/2019 2011   HGB 14.8 10/25/2017 1115   HCT 44.6 12/02/2019 2011   HCT 46.0 10/25/2017 1115   PLT 278 12/02/2019 2011   PLT 259 10/25/2017 1115   MCV 84.6 12/02/2019 2011   MCV 84 10/25/2017 1115   MCH 27.3 12/02/2019 2011   MCHC 32.3 12/02/2019 2011   RDW 14.4 12/02/2019 2011   RDW 13.3 10/25/2017 1115   LYMPHSABS 3.0 10/18/2018 1953   MONOABS 0.8 10/18/2018 1953   EOSABS 0.2 10/18/2018 1953   BASOSABS 0.1 10/18/2018 1953    Lab Results  Component Value Date   HGBA1C  6.1 (H) 12/02/2019    Assessment & Plan:  1. Essential hypertension Slightly above goal No regimen change today Counseled on blood pressure goal of less than 130/80, low-sodium, DASH diet, medication compliance, 150 minutes of moderate intensity exercise per week. Discussed medication compliance, adverse effects. - amLODipine (NORVASC) 5 MG tablet; Take 1 tablet (5 mg total) by mouth daily.  Dispense: 90 tablet; Refill: 1 - hydrochlorothiazide (HYDRODIURIL) 25 MG tablet; Take 1 tablet (25 mg total) by mouth daily.  Dispense: 90 tablet; Refill: 1 - CMP14+EGFR  2. Pure hypercholesterolemia Controlled Low-cholesterol diet - atorvastatin (LIPITOR) 40 MG tablet; Take 1 tablet (40 mg total) by mouth daily.  Dispense: 90 tablet; Refill: 1  3. Anxiety and depression Controlled - busPIRone (BUSPAR) 15 MG tablet; Take 1 tablet (15 mg total) by mouth 2 (two) times daily.  Dispense: 180 tablet; Refill: 1  4. Chronic left-sided low back pain with left-sided sciatica Uncontrolled due to running out of meloxicam which I have refilled Advised to apply heat Consider PT if symptoms persist - meloxicam (MOBIC) 15 MG tablet; Take 1 tablet (15 mg total) by mouth daily.  Dispense: 90 tablet; Refill: 1  5. Periumbilical abdominal pain History of hernia repair In the setting of uncontrolled GERD with nausea and vomiting he will need an upper endoscopy - Ambulatory referral to Gastroenterology  6. Non-intractable vomiting with nausea, unspecified vomiting type See #5 above Advised to eat small frequent portions - ondansetron (ZOFRAN-ODT) 8 MG disintegrating tablet; Take 1 tablet (8 mg total) by mouth every 8 (eight) hours as needed for nausea or vomiting.  Dispense: 20 tablet; Refill: 0   Meds ordered this encounter  Medications  . amLODipine (NORVASC) 5 MG tablet    Sig: Take 1 tablet (5 mg total) by mouth daily.    Dispense:  90 tablet    Refill:  1  . atorvastatin (LIPITOR) 40 MG tablet     Sig: Take 1 tablet (40 mg total) by mouth daily.    Dispense:  90 tablet    Refill:  1    Dose increase  . busPIRone (BUSPAR) 15 MG tablet    Sig: Take 1 tablet (15 mg total) by mouth 2 (two) times daily.    Dispense:  180 tablet    Refill:  1  . meloxicam (MOBIC) 15 MG tablet    Sig: Take 1 tablet (15 mg total) by mouth daily.    Dispense:  90 tablet    Refill:  1  . ondansetron (ZOFRAN-ODT) 8 MG disintegrating tablet    Sig: Take 1 tablet (8 mg total) by mouth every 8 (eight) hours as needed for nausea or vomiting.    Dispense:  20 tablet    Refill:  0  . hydrochlorothiazide (HYDRODIURIL) 25 MG tablet    Sig: Take 1 tablet (25 mg total) by mouth daily.    Dispense:  90 tablet    Refill:  1    Follow-up: Return in about 6 months (around 12/01/2020) for Chronic disease management.       Charlott Rakes, MD, FAAFP. [facility] [place], [place]   06/02/2020, 2:19 PM

## 2020-06-01 NOTE — Progress Notes (Signed)
Pt also would like refill on methocarbamol and fluticasone.

## 2020-06-02 ENCOUNTER — Encounter (INDEPENDENT_AMBULATORY_CARE_PROVIDER_SITE_OTHER): Payer: Self-pay

## 2020-06-02 LAB — CMP14+EGFR
ALT: 39 IU/L (ref 0–44)
AST: 28 IU/L (ref 0–40)
Albumin/Globulin Ratio: 1.9 (ref 1.2–2.2)
Albumin: 4.4 g/dL (ref 4.0–5.0)
Alkaline Phosphatase: 105 IU/L (ref 44–121)
BUN/Creatinine Ratio: 11 (ref 9–20)
BUN: 10 mg/dL (ref 6–24)
Bilirubin Total: <0.2 mg/dL (ref 0.0–1.2)
CO2: 25 mmol/L (ref 20–29)
Calcium: 9.4 mg/dL (ref 8.7–10.2)
Chloride: 101 mmol/L (ref 96–106)
Creatinine, Ser: 0.9 mg/dL (ref 0.76–1.27)
Globulin, Total: 2.3 g/dL (ref 1.5–4.5)
Glucose: 90 mg/dL (ref 65–99)
Potassium: 3.8 mmol/L (ref 3.5–5.2)
Sodium: 142 mmol/L (ref 134–144)
Total Protein: 6.7 g/dL (ref 6.0–8.5)
eGFR: 105 mL/min/{1.73_m2} (ref >59)

## 2020-06-02 MED ORDER — METHOCARBAMOL 500 MG PO TABS: 500.0000 mg | ORAL_TABLET | Freq: Three times a day (TID) | ORAL | 1 refills | Status: DC | PRN

## 2020-06-10 ENCOUNTER — Other Ambulatory Visit: Payer: Self-pay | Admitting: Family Medicine

## 2020-06-10 NOTE — Telephone Encounter (Signed)
Requested Prescriptions  Pending Prescriptions Disp Refills  . cetirizine (ZYRTEC) 10 MG tablet [Pharmacy Med Name: CETIRIZINE HCL 10 MG TABLET] 30 tablet 0    Sig: TAKE ONE TABLET BY MOUTH DAILY     Ear, Nose, and Throat:  Antihistamines Passed - 06/10/2020  9:56 AM      Passed - Valid encounter within last 12 months    Recent Outpatient Visits          1 week ago Periumbilical abdominal pain   [place], Enobong, MD   10 months ago Need for hepatitis C screening test   [place], Enobong, MD   1 year ago Muscle spasm   [place], Enobong, MD   1 year ago Suspected sleep apnea   [place], Enobong, MD   1 year ago Incisional hernia, without obstruction or gangrene   [facility] Charlott Rakes, MD

## 2020-07-12 ENCOUNTER — Other Ambulatory Visit: Payer: Self-pay | Admitting: Family Medicine

## 2020-07-12 DIAGNOSIS — R112 Nausea with vomiting, unspecified: Secondary | ICD-10-CM

## 2020-07-12 NOTE — Telephone Encounter (Signed)
Requested medications are due for refill today.  yes  Requested medications are on the active medications list.  yes  Last refill. 06/01/2020  Future visit scheduled.   no  Notes to clinic.  Medication not delegated.

## 2020-08-01 ENCOUNTER — Ambulatory Visit: Payer: Self-pay | Admitting: Gastroenterology

## 2020-09-05 ENCOUNTER — Other Ambulatory Visit: Payer: Self-pay | Admitting: Family Medicine

## 2020-09-05 DIAGNOSIS — J3089 Other allergic rhinitis: Secondary | ICD-10-CM

## 2020-09-05 NOTE — Telephone Encounter (Signed)
   Notes to clinic:  Requested script has expired  Review for refill    Requested Prescriptions  Pending Prescriptions Disp Refills   fluticasone (FLONASE) 50 MCG/ACT nasal spray [Pharmacy Med Name: FLUTICASONE PROP 50 MCG SPRAY] 16 mL     Sig: SPRAY TWO SPRAYS IN EACH NOSTRIL ONCE DAILY      Ear, Nose, and Throat: Nasal Preparations - Corticosteroids Passed - 09/05/2020 10:55 AM      Passed - Valid encounter within last 12 months    Recent Outpatient Visits           3 months ago Periumbilical abdominal pain   [facility] Charlott Rakes, MD   1 year ago Need for hepatitis C screening test   [place], Enobong, MD   1 year ago Muscle spasm   [place], Enobong, MD   1 year ago Suspected sleep apnea   [facility] Charlott Rakes, MD   1 year ago Incisional hernia, without obstruction or gangrene   [facility] Charlott Rakes, MD

## 2020-11-03 ENCOUNTER — Other Ambulatory Visit: Payer: Self-pay | Admitting: Family Medicine

## 2020-11-25 ENCOUNTER — Other Ambulatory Visit: Payer: Self-pay | Admitting: Family Medicine

## 2020-11-25 NOTE — Telephone Encounter (Signed)
Requested Prescriptions  Pending Prescriptions Disp Refills  . cetirizine (ZYRTEC) 10 MG tablet [Pharmacy Med Name: CETIRIZINE HCL 10 MG TABLET] 30 tablet 3    Sig: TAKE ONE TABLET BY MOUTH DAILY     Ear, Nose, and Throat:  Antihistamines Passed - 11/25/2020  3:09 PM      Passed - Valid encounter within last 12 months    Recent Outpatient Visits          5 months ago Periumbilical abdominal pain   [place], Enobong, MD   1 year ago Need for hepatitis C screening test   [place], Enobong, MD   1 year ago Muscle spasm   [place], Enobong, MD   1 year ago Suspected sleep apnea   [facility] Charlott Rakes, MD   1 year ago Incisional hernia, without obstruction or gangrene   [facility] Charlott Rakes, MD

## 2020-12-13 ENCOUNTER — Encounter: Payer: Self-pay | Admitting: Family Medicine

## 2020-12-19 ENCOUNTER — Other Ambulatory Visit: Payer: Self-pay | Admitting: Family Medicine

## 2020-12-19 DIAGNOSIS — G8929 Other chronic pain: Secondary | ICD-10-CM

## 2020-12-19 DIAGNOSIS — E7800 Pure hypercholesterolemia, unspecified: Secondary | ICD-10-CM

## 2020-12-19 DIAGNOSIS — F32A Depression, unspecified: Secondary | ICD-10-CM

## 2020-12-19 DIAGNOSIS — M62838 Other muscle spasm: Secondary | ICD-10-CM

## 2020-12-19 DIAGNOSIS — J3089 Other allergic rhinitis: Secondary | ICD-10-CM

## 2020-12-19 DIAGNOSIS — M5442 Lumbago with sciatica, left side: Secondary | ICD-10-CM

## 2020-12-19 DIAGNOSIS — I10 Essential (primary) hypertension: Secondary | ICD-10-CM

## 2020-12-19 MED ORDER — AMLODIPINE BESYLATE 5 MG PO TABS: 5.0000 mg | ORAL_TABLET | Freq: Every day | ORAL | 0 refills | Status: DC

## 2020-12-19 MED ORDER — OMEPRAZOLE 40 MG PO CPDR: 40.0000 mg | DELAYED_RELEASE_CAPSULE | Freq: Every day | ORAL | 0 refills | Status: DC

## 2020-12-19 MED ORDER — FLUTICASONE PROPIONATE 50 MCG/ACT NA SUSP: NASAL | 2 refills | Status: DC

## 2020-12-19 MED ORDER — BUSPIRONE HCL 15 MG PO TABS
15.0000 mg | ORAL_TABLET | Freq: Two times a day (BID) | ORAL | 0 refills | Status: DC
Start: 2020-12-19 — End: 2021-03-27

## 2020-12-19 MED ORDER — METHOCARBAMOL 500 MG PO TABS: 500.0000 mg | ORAL_TABLET | Freq: Three times a day (TID) | ORAL | 1 refills | Status: DC | PRN

## 2020-12-19 MED ORDER — HYDROCHLOROTHIAZIDE 25 MG PO TABS: 25.0000 mg | ORAL_TABLET | Freq: Every day | ORAL | 0 refills | Status: DC

## 2020-12-19 MED ORDER — ATORVASTATIN CALCIUM 40 MG PO TABS: 40.0000 mg | ORAL_TABLET | Freq: Every day | ORAL | 0 refills | Status: DC

## 2020-12-19 MED ORDER — MELOXICAM 15 MG PO TABS: 15.0000 mg | ORAL_TABLET | Freq: Every day | ORAL | 0 refills | Status: DC

## 2021-01-20 ENCOUNTER — Encounter: Payer: Self-pay | Admitting: Family Medicine

## 2021-01-26 ENCOUNTER — Encounter: Payer: Self-pay | Admitting: Family Medicine

## 2021-01-26 ENCOUNTER — Ambulatory Visit: Payer: Self-pay | Attending: Family Medicine | Admitting: Family Medicine

## 2021-01-26 DIAGNOSIS — Z13228 Encounter for screening for other metabolic disorders: Secondary | ICD-10-CM

## 2021-01-26 DIAGNOSIS — I10 Essential (primary) hypertension: Secondary | ICD-10-CM

## 2021-01-26 DIAGNOSIS — M25561 Pain in right knee: Secondary | ICD-10-CM

## 2021-01-26 DIAGNOSIS — G8929 Other chronic pain: Secondary | ICD-10-CM

## 2021-01-26 DIAGNOSIS — J3089 Other allergic rhinitis: Secondary | ICD-10-CM

## 2021-01-26 MED ORDER — CETIRIZINE HCL 10 MG PO TABS: 10.0000 mg | ORAL_TABLET | Freq: Every day | ORAL | 1 refills | Status: DC

## 2021-01-26 MED ORDER — PREDNISONE 20 MG PO TABS: 20.0000 mg | ORAL_TABLET | Freq: Every day | ORAL | 0 refills | Status: DC

## 2021-01-26 NOTE — Progress Notes (Signed)
Virtual Visit via Telephone Note  I connected with Timothy Mccarthy, on 01/26/2021 at 8:20 AM by telephone due to the COVID-19 pandemic and verified that I am speaking with the correct person using two identifiers.   Consent: I discussed the limitations, risks, security and privacy concerns of performing an evaluation and management service by telephone and the availability of in person appointments. I also discussed with the patient that there may be a patient responsible charge related to this service. The patient expressed understanding and agreed to proceed.   Location of Patient: Work  Biomedical scientist of Provider: Clinic   Persons participating in Telemedicine visit: ZAE KIRTZ Dr. Margarita Rana     History of Present Illness: Timothy Mccarthy is a 50 y.o. year old male with a history of hypertension, GERD, anxiety, Osteoarthritis of the knee , recent laparoscopic umbilical hernia repair at [facility] seen for an acute visit. He has had sharp pains in his R knee and getting up from a kneeling position is difficult as he has to use his L leg to stand up. He is on Mobic for chronic right knee osteoarthritis.  Also using a knee brace which has not been helpful.  He has also noticed intermittent swelling of his right knee. Right knee x-ray from 11/2017 had revealed: IMPRESSION: 1. 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 2. Small spur anterior superior patella. 3. Otherwise negative.  He is requesting a refill of Zyrtec for his allergies. He is currently in [place] temporarily for work and plans to return back to [place]. Past Medical History:  Diagnosis Date   Adjustment disorder with anxious mood    Allergy    Anxiety    Arthritis    "hands" (09/28/2015)   Depression    GERD (gastroesophageal reflux disease)    Hyperlipidemia    Hypertension    Sleep apnea    age 77    Tobacco use disorder    Allergies  Allergen  Reactions   Penicillins Anaphylaxis    Has patient had a PCN reaction causing immediate rash, facial/tongue/throat swelling, SOB or lightheadedness with hypotensionNO Has patient had a PCN reaction causing severe rash involving mucus membranes or skin necrosis: NO Has patient had a PCN reaction that required hospitalization NO Has patient had a PCN reaction occurring within the last 10 years: NO If all of the above answers are "NO", then may proceed with Cephalosporin use.    Current Outpatient Medications on File Prior to Visit  Medication Sig Dispense Refill   amLODipine (NORVASC) 5 MG tablet Take 1 tablet (5 mg total) by mouth daily. 90 tablet 0   atorvastatin (LIPITOR) 40 MG tablet Take 1 tablet (40 mg total) by mouth daily. 90 tablet 0   benzonatate (TESSALON) 100 MG capsule Take 1-2 capsules (100-200 mg total) by mouth 3 (three) times daily as needed. (Patient not taking: Reported on 06/01/2020) 60 capsule 0   busPIRone (BUSPAR) 15 MG tablet Take 1 tablet (15 mg total) by mouth 2 (two) times daily. 180 tablet 0   cetirizine (ZYRTEC) 10 MG tablet TAKE ONE TABLET BY MOUTH DAILY 30 tablet 3   fluticasone (FLONASE) 50 MCG/ACT nasal spray SPRAY TWO SPRAYS IN EACH NOSTRIL ONCE DAILY 16 mL 2   hydrochlorothiazide (HYDRODIURIL) 25 MG tablet Take 1 tablet (25 mg total) by mouth daily. 90 tablet 0   loperamide (IMODIUM) 2 MG capsule Take 1 capsule (2 mg total) by mouth 2 (two) times  daily as needed for diarrhea or loose stools. (Patient not taking: Reported on 06/01/2020) 14 capsule 0   meloxicam (MOBIC) 15 MG tablet Take 1 tablet (15 mg total) by mouth daily. 90 tablet 0   methocarbamol (ROBAXIN) 500 MG tablet Take 1 tablet (500 mg total) by mouth every 8 (eight) hours as needed for muscle spasms. 90 tablet 1   Multiple Vitamin (MULTIVITAMIN WITH MINERALS) TABS tablet Take 2 tablets by mouth daily.      Omega-3 Fatty Acids (FISH OIL OMEGA-3 PO) Take by mouth daily.     omeprazole (PRILOSEC) 40 MG  capsule Take 1 capsule (40 mg total) by mouth daily. 90 capsule 0   ondansetron (ZOFRAN-ODT) 8 MG disintegrating tablet DISSOLVE ONE TABLET BY MOUTH EVERY 8 HOURS AS NEEDED FOR NAUSEA OR VOMITING 20 tablet 0   promethazine-dextromethorphan (PROMETHAZINE-DM) 6.25-15 MG/5ML syrup Take 5 mLs by mouth at bedtime as needed for cough. (Patient not taking: Reported on 06/01/2020) 100 mL 0   zinc sulfate 220 (50 Zn) MG capsule Take 220 mg by mouth daily. (Patient not taking: Reported on 06/01/2020)     [DISCONTINUED] FLUoxetine (PROZAC) 20 MG tablet Take 1 tablet (20 mg total) by mouth daily. (Patient not taking: Reported on 07/23/2019) 30 tablet 3   [DISCONTINUED] sodium chloride (OCEAN) 0.65 % SOLN nasal spray Place 1 spray into both nostrils as needed for congestion. 44 mL 0   No current facility-administered medications on file prior to visit.    ROS: See HPI  Observations/Objective: Awake, alert, ranted x3 Not in acute distress Normal mood  CMP Latest Ref Rng & Units 06/01/2020 12/02/2019 07/23/2019  Glucose 65 - 99 mg/dL 90 90 97  BUN 6 - 24 mg/dL '10 11 10  ' Creatinine 0.76 - 1.27 mg/dL 0.90 1.27(H) 1.05  Sodium 134 - 144 mmol/L 142 140 140  Potassium 3.5 - 5.2 mmol/L 3.8 3.5 4.2  Chloride 96 - 106 mmol/L 101 101 98  CO2 20 - 29 mmol/L '25 27 25  ' Calcium 8.7 - 10.2 mg/dL 9.4 9.4 9.7  Total Protein 6.0 - 8.5 g/dL 6.7 7.2 7.1  Total Bilirubin 0.0 - 1.2 mg/dL <0.2 0.4 0.3  Alkaline Phos 44 - 121 IU/L 105 85 113  AST 0 - 40 IU/L '28 30 25  ' ALT 0 - 44 IU/L 39 35 26    Lipid Panel     Component Value Date/Time   CHOL 133 07/23/2019 0950   TRIG 514 (H) 07/23/2019 0950   HDL 26 (L) 07/23/2019 0950   CHOLHDL 5.1 (H) 07/23/2019 0950   LDLCALC 35 07/23/2019 0950   LABVLDL 72 (H) 07/23/2019 0950    Lab Results  Component Value Date   HGBA1C 6.1 (H) 12/02/2019    Assessment and Plan: 1. Essential hypertension Stable He is good on his refills Will order labs when he is in town he can  have them done - LP+Non-HDL Cholesterol; Future - CMP14+EGFR; Future - CBC with Differential/Platelet; Future  2. Chronic pain of right knee Uncontrolled on Mobic Advised to use knee brace Will place on short course of prednisone and referred to orthopedics - predniSONE (DELTASONE) 20 MG tablet; Take 1 tablet (20 mg total) by mouth daily with breakfast.  Dispense: 5 tablet; Refill: 0 - AMB referral to orthopedics  3. Screening for metabolic disorder - Hemoglobin A1c; Future  4. Seasonal allergic rhinitis due to other allergic trigger Zyrtec refilled   Follow Up Instructions: Will call to schedule a follow up appointment  I discussed the assessment and treatment plan with the patient. The patient was provided an opportunity to ask questions and all were answered. The patient agreed with the plan and demonstrated an understanding of the instructions.   The patient was advised to call back or seek an in-person evaluation if the symptoms worsen or if the condition fails to improve as anticipated.     I provided 11 minutes total of non-face-to-face time during this encounter.   Charlott Rakes, MD, FAAFP. [facility] [place], [place]   01/26/2021, 8:20 AM

## 2021-02-24 ENCOUNTER — Ambulatory Visit: Payer: Self-pay

## 2021-02-24 ENCOUNTER — Ambulatory Visit (INDEPENDENT_AMBULATORY_CARE_PROVIDER_SITE_OTHER): Payer: BC Managed Care – PPO | Admitting: Orthopedic Surgery

## 2021-02-24 ENCOUNTER — Other Ambulatory Visit: Payer: Self-pay

## 2021-02-24 DIAGNOSIS — M25562 Pain in left knee: Secondary | ICD-10-CM

## 2021-02-24 DIAGNOSIS — M25561 Pain in right knee: Secondary | ICD-10-CM | POA: Diagnosis not present

## 2021-02-24 DIAGNOSIS — M170 Bilateral primary osteoarthritis of knee: Secondary | ICD-10-CM

## 2021-02-24 DIAGNOSIS — G8929 Other chronic pain: Secondary | ICD-10-CM | POA: Diagnosis not present

## 2021-02-25 ENCOUNTER — Encounter: Payer: Self-pay | Admitting: Orthopedic Surgery

## 2021-02-25 MED ORDER — BUPIVACAINE HCL 0.25 % IJ SOLN
4.0000 mL | INTRAMUSCULAR | Status: AC | PRN
  Administered 2021-02-24: 4 mL via INTRA_ARTICULAR

## 2021-02-25 MED ORDER — LIDOCAINE HCL 1 % IJ SOLN
5.0000 mL | INTRAMUSCULAR | Status: AC | PRN
  Administered 2021-02-24: 5 mL

## 2021-02-25 MED ORDER — METHYLPREDNISOLONE ACETATE 40 MG/ML IJ SUSP
40.0000 mg | INTRAMUSCULAR | Status: AC | PRN
  Administered 2021-02-24: 40 mg via INTRA_ARTICULAR

## 2021-02-25 NOTE — Progress Notes (Signed)
Office Visit Note   Patient: Timothy Mccarthy           Date of Birth: December 21, 1970           MRN: [id number] Visit Date: 02/24/2021 Requested by: Charlott Rakes, MD [place],  [place] [id number] PCP: Charlott Rakes, MD  Subjective: Chief Complaint  Patient presents with   Right Knee - Pain   Left Knee - Pain    HPI: Timothy Mccarthy is a 51 year old patient with bilateral knee pain right worse than left.  Denies any history of injury.  Pain has been going on for over a year.  Will occasionally wake him from sleep at night.  He reports some swelling weakness giving way and stiffness.  He works as a Therapist, music at an apartment complex.  No prior surgery to either knee.  He also states "I have a bad back".  The pain will occasionally run to his foot.  Stairs are difficult.  He does not really report much in the way of mechanical symptoms in terms of discrete joint movement obstruction.              ROS: All systems reviewed are negative as they relate to the chief complaint within the history of present illness.  Patient denies  fevers or chills.   Assessment & Plan: Visit Diagnoses:  1. Chronic pain of both knees     Plan: Impression is bilateral knee pain with pretty reasonable looking radiographs.  Could be occult arthritis or meniscal pathology or referred pain from the back.  No nerve root tension signs today.  Injection performed into the right knee joint today.  Plan 4-week return with decision for or against imaging of the knee and/or back at that time.  Follow-Up Instructions: Return in about 4 weeks (around 03/24/2021).   Orders:  Orders Placed This Encounter  Procedures   XR Knee 1-2 Views Right   XR Knee 1-2 Views Left   No orders of the defined types were placed in this encounter.     Procedures: Large Joint Inj: R knee on 02/24/2021 9:28 AM Indications: diagnostic evaluation, joint swelling and pain Details: 18 G 1.5 in needle, superolateral  approach  Arthrogram: No  Medications: 5 mL lidocaine 1 %; 40 mg methylPREDNISolone acetate 40 MG/ML; 4 mL bupivacaine 0.25 % Outcome: tolerated well, no immediate complications Procedure, treatment alternatives, risks and benefits explained, specific risks discussed. Consent was given by the patient. Immediately prior to procedure a time out was called to verify the correct patient, procedure, equipment, support staff and site/side marked as required. Patient was prepped and draped in the usual sterile fashion.      Clinical Data: No additional findings.  Objective: Vital Signs: There were no vitals taken for this visit.  Physical Exam:   Constitutional: Patient appears well-developed HEENT:  Head: Normocephalic Eyes:EOM are normal Neck: Normal range of motion Cardiovascular: Normal rate Pulmonary/chest: Effort normal Neurologic: Patient is alert Skin: Skin is warm Psychiatric: Patient has normal mood and affect   Ortho Exam: Ortho exam demonstrates full active and passive range of motion of the right knee and left knee.  No effusion.  Collateral and cruciate ligaments are stable.  Some callus formation and skin thickening over the knees is present consistent with his work description.  Range of motion is full.  No nerve root tension signs.  Pedal pulses palpable.  Collateral and cruciate ligaments are stable.  No other masses lymphadenopathy or skin changes  noted in the knee region bilaterally.  Specialty Comments:  No specialty comments available.  Imaging: XR Knee 1-2 Views Left  Result Date: 02/25/2021 AP lateral radiographs left knee reviewed.  Alignment intact.  No acute fracture.  Patella height normal relative to distal femur.  No significant degenerative joint changes.  XR Knee 1-2 Views Right  Result Date: 02/25/2021 AP lateral radiographs right knee reviewed.  Alignment intact.  No acute fracture.  Small ossicle off the tibial tubercle is noted.  Joint spaces  maintained.  No significant degenerative joint disease present.    PMFS History: Patient Active Problem List   Diagnosis Date Noted   Chronic pain of both knees 11/26/2017   Hyperlipidemia 12/25/2016   Anxiety 06/06/2016   Essential hypertension 11/04/2015   Abdominal hernia 09/28/2015   Appendicitis 09/15/2015   Tobacco use disorder 04/21/2015   Adjustment disorder with anxious mood 04/21/2015   Health care maintenance 04/21/2015   GERD (gastroesophageal reflux disease) 04/21/2015   Past Medical History:  Diagnosis Date   Adjustment disorder with anxious mood    Allergy    Anxiety    Arthritis    "hands" (09/28/2015)   Depression    GERD (gastroesophageal reflux disease)    Hyperlipidemia    Hypertension    Sleep apnea    age 20    Tobacco use disorder     Family History  Problem Relation Age of Onset   Stroke Mother    Diabetes type II Mother    Diabetes Mother    Colon polyps Neg Hx    Esophageal cancer Neg Hx    Rectal cancer Neg Hx    Stomach cancer Neg Hx     Past Surgical History:  Procedure Laterality Date   APPENDECTOMY  09/15/2015   CARPAL TUNNEL RELEASE Bilateral    INCISIONAL HERNIA REPAIR N/A 09/29/2015   Procedure: HERNIA REPAIR INCISIONAL;  Surgeon: Erroll Luna, MD;  Location: [facility];  Service: General;  Laterality: N/A;   LAPAROSCOPIC APPENDECTOMY N/A 09/15/2015   Procedure: APPENDECTOMY LAPAROSCOPIC;  Surgeon: Judeth Horn, MD;  Location: [facility];  Service: General;  Laterality: N/A;   LUMPS ON BACK  2013   WISDOM TOOTH EXTRACTION     Social History   Occupational History   Not on file  Tobacco Use   Smoking status: Every Day    Packs/day: 0.50    Years: 23.00    Pack years: 11.50    Types: Cigarettes    Start date: 04/21/1987   Smokeless tobacco: Never   Tobacco comments:    Patches   Vaping Use   Vaping Use: Never used  Substance and Sexual Activity   Alcohol use: Yes    Alcohol/week: 4.0 standard drinks    Types: 2 Cans of beer, 2  Shots of liquor per week    Comment: "takes shots whenever has pain"   Drug use: No   Sexual activity: Yes

## 2021-03-07 ENCOUNTER — Encounter: Payer: Self-pay | Admitting: Family Medicine

## 2021-03-08 ENCOUNTER — Other Ambulatory Visit: Payer: Self-pay | Admitting: Family Medicine

## 2021-03-08 MED ORDER — CLINDAMYCIN HCL 300 MG PO CAPS: 300.0000 mg | ORAL_CAPSULE | Freq: Three times a day (TID) | ORAL | 0 refills | Status: DC

## 2021-03-14 ENCOUNTER — Other Ambulatory Visit: Payer: Self-pay | Admitting: Family Medicine

## 2021-03-14 MED ORDER — MISC. DEVICES MISC: 0 refills | Status: AC

## 2021-03-23 ENCOUNTER — Telehealth: Payer: Self-pay

## 2021-03-23 NOTE — Telephone Encounter (Signed)
Call placed to [facility], spoke to [place] who said that they are waiting for authorization from patient's insurance company for the machine and they will then call him and explain any out of pocket expense as well as schedule a time to pick it up.

## 2021-03-25 ENCOUNTER — Other Ambulatory Visit: Payer: Self-pay | Admitting: Family Medicine

## 2021-03-25 DIAGNOSIS — I10 Essential (primary) hypertension: Secondary | ICD-10-CM

## 2021-03-25 DIAGNOSIS — E7800 Pure hypercholesterolemia, unspecified: Secondary | ICD-10-CM

## 2021-03-25 NOTE — Telephone Encounter (Signed)
Requested medications are due for refill today.  yes  Requested medications are on the active medications list.  yes  Last refill. 12/19/2020 #90 0 refills  Future visit scheduled.   no  Notes to clinic.  Failed protocol d/t expired labs.    Requested Prescriptions  Pending Prescriptions Disp Refills   atorvastatin (LIPITOR) 40 MG tablet [Pharmacy Med Name: ATORVASTATIN 40 MG TABLET] 90 tablet 0    Sig: TAKE ONE TABLET BY MOUTH DAILY     Cardiovascular:  Antilipid - Statins Failed - 03/25/2021  6:54 AM      Failed - Lipid Panel in normal range within the last 12 months    Cholesterol, Total  Date Value Ref Range Status  07/23/2019 133 100 - 199 mg/dL Final   LDL Chol Calc (NIH)  Date Value Ref Range Status  07/23/2019 35 0 - 99 mg/dL Final   HDL  Date Value Ref Range Status  07/23/2019 26 (L) >39 mg/dL Final   Triglycerides  Date Value Ref Range Status  07/23/2019 514 (H) 0 - 149 mg/dL Final         Passed - Patient is not pregnant      Passed - Valid encounter within last 12 months    Recent Outpatient Visits           1 month ago Essential hypertension   [place], [place], MD   9 months ago Periumbilical abdominal pain   [facility] [place], [place], MD   1 year ago Need for hepatitis C screening test   [place], Charlane Ferretti, MD   1 year ago Muscle spasm   [place], Charlane Ferretti, MD   2 years ago Suspected sleep apnea   [place], Charlane Ferretti, MD              Signed Prescriptions Disp Refills   hydrochlorothiazide (HYDRODIURIL) 25 MG tablet 90 tablet 0    Sig: TAKE ONE TABLET BY MOUTH DAILY     Cardiovascular: Diuretics - Thiazide Failed - 03/25/2021  6:54 AM      Failed - Cr in normal range and within 180 days    Creat  Date Value Ref Range Status  03/14/2016 1.36 (H) 0.60 -  1.35 mg/dL Final   Creatinine, Ser  Date Value Ref Range Status  06/01/2020 0.90 0.76 - 1.27 mg/dL Final          Failed - K in normal range and within 180 days    Potassium  Date Value Ref Range Status  06/01/2020 3.8 3.5 - 5.2 mmol/L Final          Failed - Na in normal range and within 180 days    Sodium  Date Value Ref Range Status  06/01/2020 142 134 - 144 mmol/L Final          Failed - Last BP in normal range    BP Readings from Last 1 Encounters:  06/01/20 (!) 141/92          Passed - Valid encounter within last 6 months    Recent Outpatient Visits           1 month ago Essential hypertension   [place], Charlane Ferretti, MD   9 months ago Periumbilical abdominal pain   [facility] Charlott Rakes, MD  1 year ago Need for hepatitis C screening test   [place], [place], MD   1 year ago Muscle spasm   [place], [place], MD   2 years ago Suspected sleep apnea   [place], [place], MD               amLODipine (NORVASC) 5 MG tablet 90 tablet 0    Sig: TAKE ONE TABLET BY MOUTH DAILY     Cardiovascular: Calcium Channel Blockers 2 Failed - 03/25/2021  6:54 AM      Failed - Last BP in normal range    BP Readings from Last 1 Encounters:  06/01/20 (!) 141/92          Passed - Last Heart Rate in normal range    Pulse Readings from Last 1 Encounters:  06/01/20 89          Passed - Valid encounter within last 6 months    Recent Outpatient Visits           1 month ago Essential hypertension   [place], Charlane Ferretti, MD   9 months ago Periumbilical abdominal pain   [facility] Charlott Rakes, MD   1 year ago Need for hepatitis C screening test   [place], [place], MD    1 year ago Muscle spasm   [place], Enobong, MD   2 years ago Suspected sleep apnea   [facility] Charlott Rakes, MD

## 2021-03-25 NOTE — Telephone Encounter (Signed)
Requested Prescriptions  Pending Prescriptions Disp Refills   atorvastatin (LIPITOR) 40 MG tablet [Pharmacy Med Name: ATORVASTATIN 40 MG TABLET] 90 tablet 0    Sig: TAKE ONE TABLET BY MOUTH DAILY     Cardiovascular:  Antilipid - Statins Failed - 03/25/2021  6:54 AM      Failed - Lipid Panel in normal range within the last 12 months    Cholesterol, Total  Date Value Ref Range Status  07/23/2019 133 100 - 199 mg/dL Final   LDL Chol Calc (NIH)  Date Value Ref Range Status  07/23/2019 35 0 - 99 mg/dL Final   HDL  Date Value Ref Range Status  07/23/2019 26 (L) >39 mg/dL Final   Triglycerides  Date Value Ref Range Status  07/23/2019 514 (H) 0 - 149 mg/dL Final         Passed - Patient is not pregnant      Passed - Valid encounter within last 12 months    Recent Outpatient Visits          1 month ago Essential hypertension   [place], [place], MD   9 months ago Periumbilical abdominal pain   [facility] [place], [place], MD   1 year ago Need for hepatitis C screening test   [place], Charlane Ferretti, MD   1 year ago Muscle spasm   [place], Charlane Ferretti, MD   2 years ago Suspected sleep apnea   [place], [place], MD              hydrochlorothiazide (HYDRODIURIL) 25 MG tablet [Pharmacy Med Name: hydroCHLOROthiazide 25 MG TABLET] 90 tablet 0    Sig: TAKE ONE TABLET BY MOUTH DAILY     Cardiovascular: Diuretics - Thiazide Failed - 03/25/2021  6:54 AM      Failed - Cr in normal range and within 180 days    Creat  Date Value Ref Range Status  03/14/2016 1.36 (H) 0.60 - 1.35 mg/dL Final   Creatinine, Ser  Date Value Ref Range Status  06/01/2020 0.90 0.76 - 1.27 mg/dL Final         Failed - K in normal range and within 180 days    Potassium  Date Value Ref Range Status  06/01/2020 3.8 3.5 - 5.2  mmol/L Final         Failed - Na in normal range and within 180 days    Sodium  Date Value Ref Range Status  06/01/2020 142 134 - 144 mmol/L Final         Failed - Last BP in normal range    BP Readings from Last 1 Encounters:  06/01/20 (!) 141/92         Passed - Valid encounter within last 6 months    Recent Outpatient Visits          1 month ago Essential hypertension   [place], Charlane Ferretti, MD   9 months ago Periumbilical abdominal pain   [place], [place], MD   1 year ago Need for hepatitis C screening test   [place], En[place]ong, MD   1 year ago Muscle spasm   [place], [place], MD   2 years ago Suspected sleep apnea   [facility] [place], [place], MD              amLODipine (NORVASC) 5 MG tablet [Pharmacy Med Name: amLODIPine BESYLATE 5 MG TAB] 90 tablet 0    Sig: TAKE ONE TABLET BY MOUTH DAILY     Cardiovascular: Calcium Channel Blockers 2 Failed - 03/25/2021  6:54 AM      Failed - Last BP in normal range    BP Readings from Last 1 Encounters:  06/01/20 (!) 141/92         Passed - Last Heart Rate in normal range    Pulse Readings from Last 1 Encounters:  06/01/20 89         Passed - Valid encounter within last 6 months    Recent Outpatient Visits          1 month ago Essential hypertension   [place], Charlane Ferretti, MD   9 months ago Periumbilical abdominal pain   [facility] Charlott Rakes, MD   1 year ago Need for hepatitis C screening test   [place], [place], MD   1 year ago Muscle spasm   [place], Enobong, MD   2 years ago Suspected sleep apnea   [place] [facility] Charlott Rakes, MD

## 2021-03-26 ENCOUNTER — Other Ambulatory Visit: Payer: Self-pay | Admitting: Family Medicine

## 2021-03-26 DIAGNOSIS — F32A Depression, unspecified: Secondary | ICD-10-CM

## 2021-03-26 DIAGNOSIS — F419 Anxiety disorder, unspecified: Secondary | ICD-10-CM

## 2021-03-27 NOTE — Telephone Encounter (Signed)
Requested Prescriptions  Pending Prescriptions Disp Refills   busPIRone (BUSPAR) 15 MG tablet [Pharmacy Med Name: busPIRone HCL 15 MG TABLET] 180 tablet 1    Sig: TAKE ONE TABLET BY MOUTH TWICE A DAY     Psychiatry: Anxiolytics/Hypnotics - Non-controlled Passed - 03/26/2021  6:54 AM      Passed - Valid encounter within last 12 months    Recent Outpatient Visits          2 months ago Essential hypertension   [place], Charlane Ferretti, MD   9 months ago Periumbilical abdominal pain   [facility] Charlott Rakes, MD   1 year ago Need for hepatitis C screening test   [place], En[place], MD   1 year ago Muscle spasm   [place], Enobong, MD   2 years ago Suspected sleep apnea   [facility] Charlott Rakes, MD

## 2021-03-28 ENCOUNTER — Telehealth: Payer: Self-pay

## 2021-03-28 NOTE — Telephone Encounter (Signed)
I spoke to [facility] who said that they are still waiting for authorization from patient's insurance company for the CPAP machine

## 2021-04-05 NOTE — Telephone Encounter (Signed)
The order has been sent to [facility]

## 2021-04-09 ENCOUNTER — Other Ambulatory Visit: Payer: Self-pay | Admitting: Family Medicine

## 2021-04-09 DIAGNOSIS — G8929 Other chronic pain: Secondary | ICD-10-CM

## 2021-04-11 DIAGNOSIS — G4733 Obstructive sleep apnea (adult) (pediatric): Secondary | ICD-10-CM | POA: Diagnosis not present

## 2021-05-09 ENCOUNTER — Telehealth: Payer: Self-pay

## 2021-05-09 DIAGNOSIS — G4733 Obstructive sleep apnea (adult) (pediatric): Secondary | ICD-10-CM | POA: Diagnosis not present

## 2021-05-09 NOTE — Telephone Encounter (Signed)
Call placed to [facility], spoke to [facility] who confirmed that the patient received his CPAP machine 04/11/2021.  ?

## 2021-05-12 ENCOUNTER — Other Ambulatory Visit: Payer: Self-pay | Admitting: Family Medicine

## 2021-05-12 DIAGNOSIS — J3089 Other allergic rhinitis: Secondary | ICD-10-CM

## 2021-05-12 DIAGNOSIS — G8929 Other chronic pain: Secondary | ICD-10-CM

## 2021-05-12 NOTE — Telephone Encounter (Signed)
Copied from [place] [id number]. Topic: Quick Communication - Rx Refill/Question ?>> May 12, 2021 12:18 PM Leward Quan A wrote: ?Medication: meloxicam (MOBIC) 15 MG tablet  ? ?Has the patient contacted their pharmacy? Yes.  Pharmacy called ?(Agent: If no, request that the patient contact the pharmacy for the refill. If patient does not wish to contact the pharmacy document the reason why and proceed with request.) ?(Agent: If yes, when and what did the pharmacy advise?) ? ?Preferred Pharmacy (with phone number or street name): [facility] [id number]  ?Phone:  [phone] ?Fax:  [phone] ? ? ? ?Has the patient been seen for an appointment in the last year OR does the patient have an upcoming appointment? Yes.   ? ?Agent: Please be advised that RX refills may take up to 3 business days. We ask that you follow-up with your pharmacy. ?

## 2021-05-15 NOTE — Telephone Encounter (Signed)
Requested medication (s) are due for refill today: yes ? ?Requested medication (s) are on the active medication list: yes   ? ?Last refill: 04/10/21  #30  0 refills ? ?Future visit scheduled yes 05/18/21 ? ?Notes to clinic:Failed due to labs, please review. Thank you. ? ?Requested Prescriptions  ?Pending Prescriptions Disp Refills  ? meloxicam (MOBIC) 15 MG tablet 30 tablet 0  ?  Sig: Take 1 tablet (15 mg total) by mouth daily.  ?  ? Analgesics:  COX2 Inhibitors Failed - 05/12/2021  1:18 PM  ?  ?  Failed - Manual Review: Labs are only required if the patient has taken medication for more than 8 weeks.  ?  ?  Failed - HGB in normal range and within 360 days  ?  Hemoglobin  ?Date Value Ref Range Status  ?12/02/2019 14.4 13.0 - 17.0 g/dL Final  ?10/25/2017 14.8 13.0 - 17.7 g/dL Final  ?  ?  ?  ?  Failed - HCT in normal range and within 360 days  ?  HCT  ?Date Value Ref Range Status  ?12/02/2019 44.6 39.0 - 52.0 % Final  ? ?Hematocrit  ?Date Value Ref Range Status  ?10/25/2017 46.0 37.5 - 51.0 % Final  ?  ?  ?  ?  Passed - Cr in normal range and within 360 days  ?  Creat  ?Date Value Ref Range Status  ?03/14/2016 1.36 (H) 0.60 - 1.35 mg/dL Final  ? ?Creatinine, Ser  ?Date Value Ref Range Status  ?06/01/2020 0.90 0.76 - 1.27 mg/dL Final  ?  ?  ?  ?  Passed - AST in normal range and within 360 days  ?  AST  ?Date Value Ref Range Status  ?06/01/2020 28 0 - 40 IU/L Final  ?  ?  ?  ?  Passed - ALT in normal range and within 360 days  ?  ALT  ?Date Value Ref Range Status  ?06/01/2020 39 0 - 44 IU/L Final  ?  ?  ?  ?  Passed - eGFR is 30 or above and within 360 days  ?  GFR calc Af Amer  ?Date Value Ref Range Status  ?07/23/2019 97 >59 mL/min/1.73 Final  ?  Comment:  ?  **[facility] currently reports eGFR in compliance with the current** ?  recommendations of the [employer]. [facility] will ?  update reporting as new guidelines are published from the NKF-ASN ?  Task force. ?  ? ?GFR, Estimated  ?Date Value Ref Range  Status  ?12/02/2019 >60 >60 mL/min Final  ? ?eGFR  ?Date Value Ref Range Status  ?06/01/2020 105 >59 mL/min/1.73 Final  ?  ?  ?  ?  Passed - Patient is not pregnant  ?  ?  Passed - Valid encounter within last 12 months  ?  Recent Outpatient Visits   ? ?      ? 3 months ago Essential hypertension  ? [place], Charlane Ferretti, MD  ? 11 months ago Periumbilical abdominal pain  ? [facility] Charlott Rakes, MD  ? 1 year ago Need for hepatitis C screening test  ? [place], Charlane Ferretti, MD  ? 2 years ago Muscle spasm  ? [facility] [place], Charlane Ferretti, MD  ? 2 years ago Suspected sleep apnea  ? [place] Charlott Rakes, MD  ? ?  ?  ?  Future Appointments   ? ?        ? In 3 days Charlott Rakes, MD [place]  ? ?  ? ?  ?  ?  ? ? ? ? ?

## 2021-05-16 MED ORDER — MELOXICAM 15 MG PO TABS: 15.0000 mg | ORAL_TABLET | Freq: Every day | ORAL | 0 refills | Status: DC

## 2021-05-18 ENCOUNTER — Ambulatory Visit
Admission: RE | Admit: 2021-05-18 | Discharge: 2021-05-18 | Disposition: A | Discharge status: Home / Self Care | Payer: BC Managed Care – PPO | Source: Ambulatory Visit | Attending: Family Medicine | Admitting: Family Medicine

## 2021-05-18 ENCOUNTER — Telehealth: Payer: Self-pay | Admitting: Family Medicine

## 2021-05-18 ENCOUNTER — Ambulatory Visit: Payer: BC Managed Care – PPO | Attending: Family Medicine | Admitting: Family Medicine

## 2021-05-18 ENCOUNTER — Encounter: Payer: Self-pay | Admitting: Family Medicine

## 2021-05-18 VITALS — BP 143/96 | HR 89 | Ht 67.0 in | Wt 211.6 lb

## 2021-05-18 DIAGNOSIS — M25561 Pain in right knee: Secondary | ICD-10-CM

## 2021-05-18 DIAGNOSIS — I152 Hypertension secondary to endocrine disorders: Secondary | ICD-10-CM | POA: Diagnosis not present

## 2021-05-18 DIAGNOSIS — M545 Low back pain, unspecified: Secondary | ICD-10-CM | POA: Diagnosis not present

## 2021-05-18 DIAGNOSIS — E1159 Type 2 diabetes mellitus with other circulatory complications: Secondary | ICD-10-CM | POA: Diagnosis not present

## 2021-05-18 DIAGNOSIS — M25562 Pain in left knee: Secondary | ICD-10-CM | POA: Diagnosis not present

## 2021-05-18 DIAGNOSIS — E1169 Type 2 diabetes mellitus with other specified complication: Secondary | ICD-10-CM

## 2021-05-18 DIAGNOSIS — E785 Hyperlipidemia, unspecified: Secondary | ICD-10-CM

## 2021-05-18 DIAGNOSIS — F17200 Nicotine dependence, unspecified, uncomplicated: Secondary | ICD-10-CM

## 2021-05-18 DIAGNOSIS — M533 Sacrococcygeal disorders, not elsewhere classified: Secondary | ICD-10-CM

## 2021-05-18 DIAGNOSIS — F419 Anxiety disorder, unspecified: Secondary | ICD-10-CM

## 2021-05-18 DIAGNOSIS — G8929 Other chronic pain: Secondary | ICD-10-CM

## 2021-05-18 DIAGNOSIS — M5442 Lumbago with sciatica, left side: Secondary | ICD-10-CM | POA: Diagnosis not present

## 2021-05-18 DIAGNOSIS — M62838 Other muscle spasm: Secondary | ICD-10-CM

## 2021-05-18 DIAGNOSIS — M2578 Osteophyte, vertebrae: Secondary | ICD-10-CM | POA: Diagnosis not present

## 2021-05-18 DIAGNOSIS — F32A Depression, unspecified: Secondary | ICD-10-CM

## 2021-05-18 DIAGNOSIS — M79642 Pain in left hand: Secondary | ICD-10-CM

## 2021-05-18 LAB — POCT GLYCOSYLATED HEMOGLOBIN (HGB A1C): HbA1c, POC (controlled diabetic range): 6.5 % (ref 0.0–7.0)

## 2021-05-18 MED ORDER — OMEPRAZOLE 40 MG PO CPDR: 40.0000 mg | DELAYED_RELEASE_CAPSULE | Freq: Every day | ORAL | 0 refills | Status: DC

## 2021-05-18 MED ORDER — MELOXICAM 15 MG PO TABS: 15.0000 mg | ORAL_TABLET | Freq: Every day | ORAL | 1 refills | Status: DC

## 2021-05-18 MED ORDER — HYDROCHLOROTHIAZIDE 25 MG PO TABS: 25.0000 mg | ORAL_TABLET | Freq: Every day | ORAL | 1 refills | Status: DC

## 2021-05-18 MED ORDER — BUSPIRONE HCL 15 MG PO TABS: 15.0000 mg | ORAL_TABLET | Freq: Two times a day (BID) | ORAL | 1 refills | Status: DC

## 2021-05-18 MED ORDER — METHOCARBAMOL 500 MG PO TABS: 500.0000 mg | ORAL_TABLET | Freq: Three times a day (TID) | ORAL | 1 refills | Status: DC | PRN

## 2021-05-18 MED ORDER — FLUOXETINE HCL 20 MG PO TABS: 20.0000 mg | ORAL_TABLET | Freq: Every day | ORAL | 3 refills | Status: DC

## 2021-05-18 MED ORDER — GABAPENTIN 300 MG PO CAPS: 300.0000 mg | ORAL_CAPSULE | Freq: Two times a day (BID) | ORAL | 3 refills | Status: DC

## 2021-05-18 MED ORDER — METFORMIN HCL 500 MG PO TABS: 500.0000 mg | ORAL_TABLET | Freq: Every day | ORAL | 1 refills | Status: DC

## 2021-05-18 MED ORDER — AMLODIPINE BESYLATE 10 MG PO TABS: 10.0000 mg | ORAL_TABLET | Freq: Every day | ORAL | 1 refills | Status: DC

## 2021-05-18 MED ORDER — NICOTINE 14 MG/24HR TD PT24: 14.0000 mg | MEDICATED_PATCH | Freq: Every day | TRANSDERMAL | 2 refills | Status: DC

## 2021-05-18 MED ORDER — TRAMADOL HCL 50 MG PO TABS: 50.0000 mg | ORAL_TABLET | Freq: Every evening | ORAL | 1 refills | Status: DC | PRN

## 2021-05-18 MED ORDER — ATORVASTATIN CALCIUM 40 MG PO TABS: 40.0000 mg | ORAL_TABLET | Freq: Every day | ORAL | 1 refills | Status: DC

## 2021-05-18 NOTE — Telephone Encounter (Signed)
Copied from [place] [id number]. Topic: General - Other >> May 18, 2021  1:56 PM Tessa Lerner A wrote: Reason for CRM: The patient has made contact with the practice  The patient was recently seen and would like a note from Dr. Margarita Rana allowing them to remain out of work until 05/22/21  Please contact further

## 2021-05-18 NOTE — Progress Notes (Signed)
? ?Subjective:  ?Patient ID: Timothy Mccarthy, male    DOB: Apr 15, 1970  Age: 51 y.o. MRN: [id number] ? ?CC: Hypertension ? ? ?HPI ?Timothy Mccarthy is a 51 y.o. year old male with a history of hypertension, GERD, anxiety and depression, Osteoarthritis of the knee , laparoscopic umbilical hernia repair at [facility]. ?He now resides in [facility]. ?Accompanied by a friend to today's visit. ? ?Interval History: ?He underwent right knee cortisone injection with [facility] in 03/13/2021 but states his knee symptoms are still persistent as the injection wore off quickly. ? ?He fell a couple of weeks ago on his sacrum and his tail bone hurts. His knees, shoulder, back hurt.  States someone has to help him roll out of bed in the morning. ?His L hand hurts after he sustained a laceration at work and he has shooting pain and tingling which radiates to his elbow.  He describes symptoms as similar to what he has when he had carpal tunnel syndrome previously. ? ?In 2021, Cymbalta was discontinued due to his intolerance and he was placed on Prozac instead for depression and anxiety.  He is on meloxicam for pain and was previously on Robaxin which he has run out of. ?Labs today reveal a new diagnosis of type 2 diabetes mellitus with A1c of 6.5. ?He is requesting a prescription for nicotine patches ?Past Medical History:  ?Diagnosis Date  ? Adjustment disorder with anxious mood   ? Allergy   ? Anxiety   ? Arthritis   ? "hands" (09/28/2015)  ? Depression   ? GERD (gastroesophageal reflux disease)   ? Hyperlipidemia   ? Hypertension   ? Sleep apnea   ? age 15   ? Tobacco use disorder   ? ? ?Past Surgical History:  ?Procedure Laterality Date  ? APPENDECTOMY  09/15/2015  ? CARPAL TUNNEL RELEASE Bilateral   ? INCISIONAL HERNIA REPAIR N/A 09/29/2015  ? Procedure: HERNIA REPAIR INCISIONAL;  Surgeon: Erroll Luna, MD;  Location: [facility];  Service: General;  Laterality: N/A;  ? LAPAROSCOPIC APPENDECTOMY N/A 09/15/2015  ? Procedure:  APPENDECTOMY LAPAROSCOPIC;  Surgeon: Judeth Horn, MD;  Location: [facility];  Service: General;  Laterality: N/A;  ? LUMPS ON BACK  2013  ? WISDOM TOOTH EXTRACTION    ? ? ?Family History  ?Problem Relation Age of Onset  ? Stroke Mother   ? Diabetes type II Mother   ? Diabetes Mother   ? Colon polyps Neg Hx   ? Esophageal cancer Neg Hx   ? Rectal cancer Neg Hx   ? Stomach cancer Neg Hx   ? ? ?Social History  ? ?Socioeconomic History  ? Marital status: Significant Other  ?  Spouse name: Not on file  ? Number of children: Not on file  ? Years of education: Not on file  ? Highest education level: Not on file  ?Occupational History  ? Not on file  ?Tobacco Use  ? Smoking status: Every Day  ?  Packs/day: 0.50  ?  Years: 23.00  ?  Pack years: 11.50  ?  Types: Cigarettes  ?  Start date: 04/21/1987  ? Smokeless tobacco: Never  ? Tobacco comments:  ?  Patches   ?Vaping Use  ? Vaping Use: Never used  ?Substance and Sexual Activity  ? Alcohol use: Yes  ?  Alcohol/week: 4.0 standard drinks  ?  Types: 2 Cans of beer, 2 Shots of liquor per week  ?  Comment: "takes shots whenever has  pain"  ? Drug use: No  ? Sexual activity: Yes  ?Other Topics Concern  ? Not on file  ?Social History Narrative  ? 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  ? ?Social Determinants of Health  ? ?Financial Resource Strain: Not on file  ?Food Insecurity: Not on file  ?Transportation Needs: Not on file  ?Physical Activity: Not on file  ?Stress: Not on file  ?Social Connections: Not on file  ? ? ?Allergies  ?Allergen Reactions  ? Penicillins Anaphylaxis  ?  Has patient had a PCN reaction causing immediate rash, facial/tongue/throat swelling, SOB or lightheadedness with hypotensionNO ?Has patient had a PCN reaction causing severe rash involving mucus membranes or skin necrosis: NO ?Has patient had a PCN reaction that required hospitalization NO ?Has patient had a PCN reaction occurring within the last 10 years:  NO ?If all of the above answers are "NO", then may proceed with Cephalosporin use.  ? ? ?Outpatient Medications Prior to Visit  ?Medication Sig Dispense Refill  ? cetirizine (ZYRTEC) 10 MG tablet Take 1 tablet (10 mg total) by mouth daily. 90 tablet 1  ? clindamycin (CLEOCIN) 300 MG capsule Take 1 capsule (300 mg total) by mouth 3 (three) times daily. 30 capsule 0  ? fluticasone (FLONASE) 50 MCG/ACT nasal spray SPRAY TWO SPRAYS IN EACH NOSTRIL ONCE DAILY 16 mL 0  ? Misc. Devices MISC CPAP machine on autopap 5 to 15 1 each 0  ? Multiple Vitamin (MULTIVITAMIN WITH MINERALS) TABS tablet Take 2 tablets by mouth daily.     ? Omega-3 Fatty Acids (FISH OIL OMEGA-3 PO) Take by mouth daily.    ? ondansetron (ZOFRAN-ODT) 8 MG disintegrating tablet DISSOLVE ONE TABLET BY MOUTH EVERY 8 HOURS AS NEEDED FOR NAUSEA OR VOMITING 20 tablet 0  ? predniSONE (DELTASONE) 20 MG tablet Take 1 tablet (20 mg total) by mouth daily with breakfast. 5 tablet 0  ? amLODipine (NORVASC) 5 MG tablet TAKE ONE TABLET BY MOUTH DAILY 90 tablet 0  ? atorvastatin (LIPITOR) 40 MG tablet TAKE ONE TABLET BY MOUTH DAILY 90 tablet 0  ? busPIRone (BUSPAR) 15 MG tablet TAKE ONE TABLET BY MOUTH TWICE A DAY 180 tablet 1  ? hydrochlorothiazide (HYDRODIURIL) 25 MG tablet TAKE ONE TABLET BY MOUTH DAILY 90 tablet 0  ? meloxicam (MOBIC) 15 MG tablet Take 1 tablet (15 mg total) by mouth daily. 30 tablet 0  ? methocarbamol (ROBAXIN) 500 MG tablet Take 1 tablet (500 mg total) by mouth every 8 (eight) hours as needed for muscle spasms. 90 tablet 1  ? omeprazole (PRILOSEC) 40 MG capsule Take 1 capsule (40 mg total) by mouth daily. 90 capsule 0  ? benzonatate (TESSALON) 100 MG capsule Take 1-2 capsules (100-200 mg total) by mouth 3 (three) times daily as needed. (Patient not taking: Reported on 06/01/2020) 60 capsule 0  ? loperamide (IMODIUM) 2 MG capsule Take 1 capsule (2 mg total) by mouth 2 (two) times daily as needed for diarrhea or loose stools. (Patient not taking:  Reported on 06/01/2020) 14 capsule 0  ? promethazine-dextromethorphan (PROMETHAZINE-DM) 6.25-15 MG/5ML syrup Take 5 mLs by mouth at bedtime as needed for cough. (Patient not taking: Reported on 06/01/2020) 100 mL 0  ? zinc sulfate 220 (50 Zn) MG capsule Take 220 mg by mouth daily. (Patient not taking: Reported on 06/01/2020)    ? ?No facility-administered medications prior to visit.  ? ? ? ?ROS ?Review of Systems  ?  Constitutional:  Negative for activity change and appetite change.  ?HENT:  Negative for sinus pressure and sore throat.   ?Eyes:  Negative for visual disturbance.  ?Respiratory:  Negative for cough, chest tightness and shortness of breath.   ?Cardiovascular:  Negative for chest pain and leg swelling.  ?Gastrointestinal:  Negative for abdominal distention, abdominal pain, constipation and diarrhea.  ?Endocrine: Negative.   ?Genitourinary:  Negative for dysuria.  ?Musculoskeletal:   ?     See HPI  ?Skin:  Negative for rash.  ?Allergic/Immunologic: Negative.   ?Neurological:  Positive for numbness. Negative for weakness and light-headedness.  ?Psychiatric/Behavioral:  Negative for dysphoric mood and suicidal ideas.   ? ?Objective:  ?BP (!) 143/96   Pulse 89   Ht '5\' 7"'$  (1.702 m)   Wt 211 lb 9.6 oz (96 kg)   SpO2 98%   BMI 33.14 kg/m?  ? ? ?  05/18/2021  ? 10:29 AM 06/01/2020  ?  3:18 PM 02/16/2020  ? 11:46 AM  ?BP/Weight  ?Systolic BP 009 233 007  ?Diastolic BP 96 92 93  ?Wt. (Lbs) 211.6 205.6   ?BMI 33.14 kg/m2 32.2 kg/m2   ? ? ? ? ?Physical Exam ?Constitutional:   ?   Appearance: He is well-developed.  ?Cardiovascular:  ?   Rate and Rhythm: Normal rate.  ?   Heart sounds: Normal heart sounds. No murmur heard. ?Pulmonary:  ?   Effort: Pulmonary effort is normal.  ?   Breath sounds: Normal breath sounds. No wheezing or rales.  ?Chest:  ?   Chest wall: No tenderness.  ?Abdominal:  ?   General: Bowel sounds are normal. There is no distension.  ?   Palpations: Abdomen is soft. There is no mass.  ?   Tenderness:  There is no abdominal tenderness.  ?Musculoskeletal:     ?   General: Normal range of motion.  ?   Right lower leg: No edema.  ?   Left lower leg: No edema.  ?   Comments: Tenderness to palpation of lumbar spine ?[place]

## 2021-05-18 NOTE — Patient Instructions (Signed)

## 2021-05-18 NOTE — Telephone Encounter (Signed)
Routing to PCP for review.

## 2021-05-18 NOTE — Progress Notes (Signed)
Fell 2 weeks ago having pain in tailbone and lower back. ?Discuss nicotine patches to quit smoking. ?

## 2021-05-19 NOTE — Telephone Encounter (Signed)
Letter sent to him via my chart ?

## 2021-05-20 LAB — ANTI-DNA ANTIBODY, DOUBLE-STRANDED: dsDNA Ab: <1 IU/mL (ref 0–9)

## 2021-05-20 LAB — CBC WITH DIFFERENTIAL/PLATELET
Basophils Absolute: 0.1 10*3/uL (ref 0.0–0.2)
Basos: 1 %
EOS (ABSOLUTE): 0.3 10*3/uL (ref 0.0–0.4)
Eos: 3 %
Hematocrit: 43.6 % (ref 37.5–51.0)
Hemoglobin: 14.7 g/dL (ref 13.0–17.7)
Immature Grans (Abs): 0 10*3/uL (ref 0.0–0.1)
Immature Granulocytes: 0 %
Lymphocytes Absolute: 3.1 10*3/uL (ref 0.7–3.1)
Lymphs: 29 %
MCH: 27.7 pg (ref 26.6–33.0)
MCHC: 33.7 g/dL (ref 31.5–35.7)
MCV: 82 fL (ref 79–97)
Monocytes Absolute: 0.9 10*3/uL (ref 0.1–0.9)
Monocytes: 8 %
Neutrophils Absolute: 6.3 10*3/uL (ref 1.4–7.0)
Neutrophils: 59 %
Platelets: 272 10*3/uL (ref 150–450)
RBC: 5.31 x10E6/uL (ref 4.14–5.80)
RDW: 13.2 % (ref 11.6–15.4)
WBC: 10.6 10*3/uL (ref 3.4–10.8)

## 2021-05-20 LAB — CMP14+EGFR
ALT: 37 IU/L (ref 0–44)
AST: 30 IU/L (ref 0–40)
Albumin/Globulin Ratio: 2.3 — ABNORMAL HIGH (ref 1.2–2.2)
Albumin: 4.8 g/dL (ref 4.0–5.0)
Alkaline Phosphatase: 113 IU/L (ref 44–121)
BUN/Creatinine Ratio: 10 (ref 9–20)
BUN: 11 mg/dL (ref 6–24)
Bilirubin Total: 0.4 mg/dL (ref 0.0–1.2)
CO2: 25 mmol/L (ref 20–29)
Calcium: 9.7 mg/dL (ref 8.7–10.2)
Chloride: 100 mmol/L (ref 96–106)
Creatinine, Ser: 1.14 mg/dL (ref 0.76–1.27)
Globulin, Total: 2.1 g/dL (ref 1.5–4.5)
Glucose: 90 mg/dL (ref 70–99)
Potassium: 3.7 mmol/L (ref 3.5–5.2)
Sodium: 143 mmol/L (ref 134–144)
Total Protein: 6.9 g/dL (ref 6.0–8.5)
eGFR: 78 mL/min/{1.73_m2} (ref >59)

## 2021-05-20 LAB — ANA,IFA RA DIAG PNL W/RFLX TIT/PATN
ANA Titer 1: NEGATIVE
Cyclic Citrullin Peptide Ab: 1 units (ref 0–19)
Rheumatoid fact SerPl-aCnc: <10 IU/mL (ref <14.0)

## 2021-05-20 LAB — LP+NON-HDL CHOLESTEROL
Cholesterol, Total: 107 mg/dL (ref 100–199)
HDL: 35 mg/dL — ABNORMAL LOW (ref >39)
LDL Chol Calc (NIH): 38 mg/dL (ref 0–99)
Total Non-HDL-Chol (LDL+VLDL): 72 mg/dL (ref 0–129)
Triglycerides: 215 mg/dL — ABNORMAL HIGH (ref 0–149)
VLDL Cholesterol Cal: 34 mg/dL (ref 5–40)

## 2021-05-20 LAB — C-REACTIVE PROTEIN: CRP: 6 mg/L (ref 0–10)

## 2021-05-20 LAB — ANTI-SMITH ANTIBODY: ENA SM Ab Ser-aCnc: <0.2 AI (ref 0.0–0.9)

## 2021-05-20 LAB — SEDIMENTATION RATE: Sed Rate: 25 mm/hr (ref 0–30)

## 2021-05-31 DIAGNOSIS — M25562 Pain in left knee: Secondary | ICD-10-CM | POA: Diagnosis not present

## 2021-05-31 DIAGNOSIS — M5442 Lumbago with sciatica, left side: Secondary | ICD-10-CM | POA: Diagnosis not present

## 2021-05-31 DIAGNOSIS — M25561 Pain in right knee: Secondary | ICD-10-CM | POA: Diagnosis not present

## 2021-06-07 DIAGNOSIS — M5442 Lumbago with sciatica, left side: Secondary | ICD-10-CM | POA: Diagnosis not present

## 2021-06-07 DIAGNOSIS — M25561 Pain in right knee: Secondary | ICD-10-CM | POA: Diagnosis not present

## 2021-06-07 DIAGNOSIS — M25562 Pain in left knee: Secondary | ICD-10-CM | POA: Diagnosis not present

## 2021-06-09 DIAGNOSIS — G4733 Obstructive sleep apnea (adult) (pediatric): Secondary | ICD-10-CM | POA: Diagnosis not present

## 2021-06-19 ENCOUNTER — Other Ambulatory Visit: Payer: Self-pay | Admitting: Family Medicine

## 2021-06-19 DIAGNOSIS — J3089 Other allergic rhinitis: Secondary | ICD-10-CM

## 2021-06-20 NOTE — Telephone Encounter (Signed)
Requested Prescriptions  ?Pending Prescriptions Disp Refills  ?? fluticasone (FLONASE) 50 MCG/ACT nasal spray [Pharmacy Med Name: FLUTICASONE PROP 50 MCG SPRAY] 16 mL 0  ?  Sig: INSTILL 2 SPRAYS INTO EACH NOSTRIL DAILY  ?  ? Ear, Nose, and Throat: Nasal Preparations - Corticosteroids Passed - 06/19/2021 12:38 PM  ?  ?  Passed - Valid encounter within last 12 months  ?  Recent Outpatient Visits   ?      ? 1 month ago Type 2 diabetes mellitus with other specified complication, without long-term current use of insulin ([place])  ? [place], Charlane Ferretti, MD  ? 4 months ago Essential hypertension  ? [place], MD  ? 1 year ago Periumbilical abdominal pain  ? [facility] Charlott Rakes, MD  ? 1 year ago Need for hepatitis C screening test  ? [place], Charlane Ferretti, MD  ? 2 years ago Muscle spasm  ? [place] Charlott Rakes, MD  ?  ?  ?Future Appointments   ?        ? In 5 months Charlott Rakes, MD [place]  ?  ? ?  ?  ?  ? ?

## 2021-06-23 ENCOUNTER — Other Ambulatory Visit: Payer: Self-pay | Admitting: Family Medicine

## 2021-07-09 DIAGNOSIS — G4733 Obstructive sleep apnea (adult) (pediatric): Secondary | ICD-10-CM | POA: Diagnosis not present

## 2021-08-09 DIAGNOSIS — G4733 Obstructive sleep apnea (adult) (pediatric): Secondary | ICD-10-CM | POA: Diagnosis not present

## 2021-09-08 DIAGNOSIS — G4733 Obstructive sleep apnea (adult) (pediatric): Secondary | ICD-10-CM | POA: Diagnosis not present

## 2021-09-13 ENCOUNTER — Other Ambulatory Visit: Payer: Self-pay | Admitting: Family Medicine

## 2021-09-13 DIAGNOSIS — F419 Anxiety disorder, unspecified: Secondary | ICD-10-CM

## 2021-09-14 NOTE — Telephone Encounter (Signed)
Requested Prescriptions  Pending Prescriptions Disp Refills  . FLUoxetine (PROZAC) 20 MG tablet [Pharmacy Med Name: FLUoxetine HCL 20 MG TABLET] 30 tablet 2    Sig: TAKE ONE TABLET BY MOUTH DAILY     Psychiatry:  Antidepressants - SSRI Passed - 09/13/2021  7:33 PM      Passed - Valid encounter within last 6 months    Recent Outpatient Visits          3 months ago Type 2 diabetes mellitus with other specified complication, without long-term current use of insulin ([place])   [place], Enobong, MD   7 months ago Essential hypertension   [place], Enobong, MD   1 year ago Periumbilical abdominal pain   [facility] Charlott Rakes, MD   2 years ago Need for hepatitis C screening test   [place], Enobong, MD   2 years ago Muscle spasm   [place], Enobong, MD      Future Appointments            In 2 months Charlott Rakes, MD [place]

## 2021-10-09 DIAGNOSIS — G4733 Obstructive sleep apnea (adult) (pediatric): Secondary | ICD-10-CM | POA: Diagnosis not present

## 2021-10-11 ENCOUNTER — Other Ambulatory Visit: Payer: Self-pay | Admitting: Family Medicine

## 2021-10-11 DIAGNOSIS — J3089 Other allergic rhinitis: Secondary | ICD-10-CM

## 2021-10-17 ENCOUNTER — Other Ambulatory Visit: Payer: Self-pay | Admitting: Family Medicine

## 2021-10-17 DIAGNOSIS — J3089 Other allergic rhinitis: Secondary | ICD-10-CM

## 2021-10-18 ENCOUNTER — Other Ambulatory Visit: Payer: Self-pay | Admitting: Family Medicine

## 2021-10-18 DIAGNOSIS — M79642 Pain in left hand: Secondary | ICD-10-CM

## 2021-10-24 ENCOUNTER — Other Ambulatory Visit: Payer: Self-pay | Admitting: Family Medicine

## 2021-10-24 DIAGNOSIS — J3089 Other allergic rhinitis: Secondary | ICD-10-CM

## 2021-10-24 DIAGNOSIS — G8929 Other chronic pain: Secondary | ICD-10-CM

## 2021-11-09 DIAGNOSIS — G4733 Obstructive sleep apnea (adult) (pediatric): Secondary | ICD-10-CM | POA: Diagnosis not present

## 2021-11-11 ENCOUNTER — Other Ambulatory Visit: Payer: Self-pay | Admitting: Family Medicine

## 2021-11-11 DIAGNOSIS — J3089 Other allergic rhinitis: Secondary | ICD-10-CM

## 2021-11-19 ENCOUNTER — Other Ambulatory Visit: Payer: Self-pay | Admitting: Family Medicine

## 2021-11-19 DIAGNOSIS — E1169 Type 2 diabetes mellitus with other specified complication: Secondary | ICD-10-CM

## 2021-11-20 ENCOUNTER — Other Ambulatory Visit: Payer: Self-pay | Admitting: Family Medicine

## 2021-11-20 NOTE — Telephone Encounter (Signed)
Requested Prescriptions  Pending Prescriptions Disp Refills  . metFORMIN (GLUCOPHAGE) 500 MG tablet [Pharmacy Med Name: metFORMIN HCL 500 MG TABLET] 90 tablet 0    Sig: TAKE ONE TABLET BY MOUTH DAILY WITH BREAKFAST     Endocrinology:  Diabetes - Biguanides Failed - 11/19/2021  8:06 PM      Failed - HBA1C is between 0 and 7.9 and within 180 days    HbA1c, POC (controlled diabetic range)  Date Value Ref Range Status  05/18/2021 6.5 0.0 - 7.0 % Final         Failed - B12 Level in normal range and within 720 days    Vitamin B-12  Date Value Ref Range Status  03/27/2017 >2000 (H) 232 - 1245 pg/mL Final         Failed - Valid encounter within last 6 months    Recent Outpatient Visits          6 months ago Type 2 diabetes mellitus with other specified complication, without long-term current use of insulin ([place])   [place], Charlane Ferretti, MD   9 months ago Essential hypertension   [place], Charlane Ferretti, MD   1 year ago Periumbilical abdominal pain   [place], Enobong, MD   2 years ago Need for hepatitis C screening test   [place], Charlane Ferretti, MD   2 years ago Muscle spasm   [place], Charlane Ferretti, MD      Future Appointments            In 3 days Charlott Rakes, MD [place] - Cr in normal range and within 360 days    Creat  Date Value Ref Range Status  03/14/2016 1.36 (H) 0.60 - 1.35 mg/dL Final   Creatinine, Ser  Date Value Ref Range Status  05/18/2021 1.14 0.76 - 1.27 mg/dL Final         Passed - eGFR in normal range and within 360 days    GFR calc Af Amer  Date Value Ref Range Status  07/23/2019 97 >59 mL/min/1.73 Final    Comment:    **[facility] currently reports eGFR in compliance with the current**   recommendations of the [facility]. [facility] will   update reporting as new guidelines are published from the NKF-ASN   Task force.    GFR, Estimated  Date Value Ref Range Status  12/02/2019 >60 >60 mL/min Final   eGFR  Date Value Ref Range Status  05/18/2021 78 >59 mL/min/1.73 Final         Passed - CBC within normal limits and completed in the last 12 months    WBC  Date Value Ref Range Status  05/18/2021 10.6 3.4 - 10.8 x10E3/uL Final  12/02/2019 15.4 (H) 4.0 - 10.5 K/uL Final   RBC  Date Value Ref Range Status  05/18/2021 5.31 4.14 - 5.80 x10E6/uL Final  12/02/2019 5.27 4.22 - 5.81 MIL/uL Final   Hemoglobin  Date Value Ref Range Status  05/18/2021 14.7 13.0 - 17.7 g/dL Final   Hematocrit  Date Value Ref Range Status  05/18/2021 43.6 37.5 - 51.0 % Final   MCHC  Date Value Ref Range Status  05/18/2021 33.7 31.5 - 35.7 g/dL Final  12/02/2019 32.3 30.0 - 36.0 g/dL Final  [facility]  Date Value Ref Range Status  05/18/2021 27.7 26.6 - 33.0 pg Final  12/02/2019 27.3 26.0 - 34.0 pg Final   MCV  Date Value Ref Range Status  05/18/2021 82 79 - 97 fL Final   No results found for: "PLTCOUNTKUC", "LABPLAT", "POCPLA" RDW  Date Value Ref Range Status  05/18/2021 13.2 11.6 - 15.4 % Final         . omeprazole (PRILOSEC) 40 MG capsule [Pharmacy Med Name: OMEPRAZOLE DR 40 MG CAPSULE] 90 capsule 1    Sig: TAKE 1 CAPSULE BY MOUTH DAILY     Gastroenterology: Proton Pump Inhibitors Passed - 11/19/2021  8:06 PM      Passed - Valid encounter within last 12 months    Recent Outpatient Visits          6 months ago Type 2 diabetes mellitus with other specified complication, without long-term current use of insulin ([place])   [place], Enobong, MD   9 months ago Essential hypertension   [place], Charlane Ferretti, MD   1 year ago Periumbilical abdominal pain   [place], Enobong, MD   2  years ago Need for hepatitis C screening test   [place], Enobong, MD   2 years ago Muscle spasm   [place], MD      Future Appointments            In 3 days Charlott Rakes, MD [place]

## 2021-11-22 ENCOUNTER — Other Ambulatory Visit: Payer: Self-pay | Admitting: Family Medicine

## 2021-11-22 DIAGNOSIS — J3089 Other allergic rhinitis: Secondary | ICD-10-CM

## 2021-11-23 ENCOUNTER — Ambulatory Visit: Payer: BC Managed Care – PPO | Attending: Family Medicine | Admitting: Family Medicine

## 2021-11-23 ENCOUNTER — Encounter: Payer: Self-pay | Admitting: Family Medicine

## 2021-11-23 VITALS — BP 139/94 | HR 97 | Temp 99.3°F | Ht 67.0 in | Wt 195.0 lb

## 2021-11-23 DIAGNOSIS — F32A Depression, unspecified: Secondary | ICD-10-CM

## 2021-11-23 DIAGNOSIS — U071 COVID-19: Secondary | ICD-10-CM

## 2021-11-23 DIAGNOSIS — M62838 Other muscle spasm: Secondary | ICD-10-CM

## 2021-11-23 DIAGNOSIS — I152 Hypertension secondary to endocrine disorders: Secondary | ICD-10-CM

## 2021-11-23 DIAGNOSIS — F419 Anxiety disorder, unspecified: Secondary | ICD-10-CM

## 2021-11-23 DIAGNOSIS — E1169 Type 2 diabetes mellitus with other specified complication: Secondary | ICD-10-CM

## 2021-11-23 DIAGNOSIS — K219 Gastro-esophageal reflux disease without esophagitis: Secondary | ICD-10-CM

## 2021-11-23 DIAGNOSIS — E785 Hyperlipidemia, unspecified: Secondary | ICD-10-CM

## 2021-11-23 DIAGNOSIS — J3089 Other allergic rhinitis: Secondary | ICD-10-CM | POA: Diagnosis not present

## 2021-11-23 DIAGNOSIS — E1159 Type 2 diabetes mellitus with other circulatory complications: Secondary | ICD-10-CM

## 2021-11-23 LAB — POCT GLYCOSYLATED HEMOGLOBIN (HGB A1C): HbA1c, POC (controlled diabetic range): 6 % (ref 0.0–7.0)

## 2021-11-23 MED ORDER — AMLODIPINE BESYLATE 10 MG PO TABS: 10.0000 mg | ORAL_TABLET | Freq: Every day | ORAL | 1 refills | Status: DC

## 2021-11-23 MED ORDER — HYDROCOD POLI-CHLORPHE POLI ER 10-8 MG/5ML PO SUER: 5.0000 mL | Freq: Two times a day (BID) | ORAL | 0 refills | Status: DC | PRN

## 2021-11-23 MED ORDER — OMEPRAZOLE 40 MG PO CPDR: 40.0000 mg | DELAYED_RELEASE_CAPSULE | Freq: Every day | ORAL | 1 refills | Status: DC

## 2021-11-23 MED ORDER — CETIRIZINE HCL 10 MG PO TABS: 10.0000 mg | ORAL_TABLET | Freq: Every day | ORAL | 0 refills | Status: DC

## 2021-11-23 MED ORDER — ATORVASTATIN CALCIUM 40 MG PO TABS: 40.0000 mg | ORAL_TABLET | Freq: Every day | ORAL | 1 refills | Status: DC

## 2021-11-23 MED ORDER — FLUOXETINE HCL 20 MG PO TABS: 20.0000 mg | ORAL_TABLET | Freq: Every day | ORAL | 1 refills | Status: DC

## 2021-11-23 MED ORDER — HYDROCHLOROTHIAZIDE 25 MG PO TABS: 25.0000 mg | ORAL_TABLET | Freq: Every day | ORAL | 1 refills | Status: DC

## 2021-11-23 MED ORDER — GABAPENTIN 300 MG PO CAPS: 300.0000 mg | ORAL_CAPSULE | Freq: Two times a day (BID) | ORAL | 3 refills | Status: DC

## 2021-11-23 MED ORDER — BUSPIRONE HCL 15 MG PO TABS: 15.0000 mg | ORAL_TABLET | Freq: Two times a day (BID) | ORAL | 1 refills | Status: DC

## 2021-11-23 MED ORDER — METFORMIN HCL 500 MG PO TABS: 500.0000 mg | ORAL_TABLET | Freq: Every day | ORAL | 1 refills | Status: DC

## 2021-11-23 MED ORDER — METHOCARBAMOL 500 MG PO TABS: 500.0000 mg | ORAL_TABLET | Freq: Three times a day (TID) | ORAL | 1 refills | Status: DC | PRN

## 2021-11-23 MED ORDER — FLUTICASONE PROPIONATE 50 MCG/ACT NA SUSP: 1.0000 | Freq: Every day | NASAL | 6 refills | Status: DC

## 2021-11-23 MED ORDER — NICOTINE 14 MG/24HR TD PT24: 14.0000 mg | MEDICATED_PATCH | Freq: Every day | TRANSDERMAL | 2 refills | Status: AC

## 2021-11-23 MED ORDER — MELOXICAM 15 MG PO TABS: 15.0000 mg | ORAL_TABLET | Freq: Every day | ORAL | 1 refills | Status: DC

## 2021-11-23 NOTE — Patient Instructions (Signed)

## 2021-11-23 NOTE — Progress Notes (Signed)
Covid exposure Body aches, coughing.

## 2021-11-23 NOTE — Progress Notes (Signed)
Subjective:  Patient ID: Timothy Mccarthy, male    DOB: 09-15-1970  Age: 51 y.o. MRN: [id number]  CC: Hypertension and Diabetes   HPI Timothy Mccarthy is a 51 y.o. year old male with a history of hypertension, GERD, anxiety and depression, Osteoarthritis of the knee , laparoscopic umbilical hernia repair at [facility], type 2 diabetes mellitus (A1c 6.0) He now resides in [facility].  Interval History:  For the last 6 days he has had chills, body aches, reduced appetite, headache, no loss of sense or smell. Diarrhea occurred early this week. He has a productive cough as well.  Endorses exposure to persons with COVID-19 infection Not vaccinated against COVID-19. Did a  home COVID-19 test and he had a faint pink line  Doing well on metformin and his A1c is 6.0 down from 6.5 previously and he denies symptoms of hypoglycemia, neuropathy or visual concerns.  At his last visit amlodipine dose was increased from 5 mg to 10 mg but his blood pressure is still slightly elevated.  Endorses adherence with his antihypertensive. He did undergo PT for his knees and reports improvement in knee pain but does have intermittent right-sided low back pain.  He does have meloxicam, muscle relaxant for his pain. Smokes half a ppd  Past Medical History:  Diagnosis Date   Adjustment disorder with anxious mood    Allergy    Anxiety    Arthritis    "hands" (09/28/2015)   Depression    GERD (gastroesophageal reflux disease)    Hyperlipidemia    Hypertension    Sleep apnea    age 56    Tobacco use disorder     Past Surgical History:  Procedure Laterality Date   APPENDECTOMY  09/15/2015   CARPAL TUNNEL RELEASE Bilateral    INCISIONAL HERNIA REPAIR N/A 09/29/2015   Procedure: HERNIA REPAIR INCISIONAL;  Surgeon: Erroll Luna, MD;  Location: [place];  Service: General;  Laterality: N/A;   LAPAROSCOPIC APPENDECTOMY N/A 09/15/2015   Procedure: APPENDECTOMY LAPAROSCOPIC;  Surgeon: Judeth Horn, MD;  Location:  [place];  Service: General;  Laterality: N/A;   LUMPS ON BACK  2013   WISDOM TOOTH EXTRACTION      Family History  Problem Relation Age of Onset   Stroke Mother    Diabetes type II Mother    Diabetes Mother    Colon polyps Neg Hx    Esophageal cancer Neg Hx    Rectal cancer Neg Hx    Stomach cancer Neg Hx     Social History   Socioeconomic History   Marital status: Significant Other    Spouse name: Not on file   Number of children: Not on file   Years of education: Not on file   Highest education level: Not on file  Occupational History   Not on file  Tobacco Use   Smoking status: Every Day    Packs/day: 0.50    Years: 23.00    Total pack years: 11.50    Types: Cigarettes    Start date: 04/21/1987   Smokeless tobacco: Never   Tobacco comments:    Patches   Vaping Use   Vaping Use: Never used  Substance and Sexual Activity   Alcohol use: Yes    Alcohol/week: 4.0 standard drinks of alcohol    Types: 2 Cans of beer, 2 Shots of liquor per week    Comment: "takes shots whenever has pain"   Drug use: No   Sexual activity: Yes  Other Topics Concern   Not on file  Social History Narrative   He lives with his wife in [place] and 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   Social Determinants of Health   Financial Resource Strain: Not on file  Food Insecurity: Not on file  Transportation Needs: Not on file  Physical Activity: Not on file  Stress: Not on file  Social Connections: Not on file    Allergies  Allergen Reactions   Penicillins Anaphylaxis    Has patient had a PCN reaction causing immediate rash, facial/tongue/throat swelling, SOB or lightheadedness with hypotensionNO Has patient had a PCN reaction causing severe rash involving mucus membranes or skin necrosis: NO Has patient had a PCN reaction that required hospitalization NO Has patient had a PCN reaction occurring within the last 10 years: NO If all of the above answers are "NO", then  may proceed with Cephalosporin use.    Outpatient Medications Prior to Visit  Medication Sig Dispense Refill   clindamycin (CLEOCIN) 300 MG capsule Take 1 capsule (300 mg total) by mouth 3 (three) times daily. 30 capsule 0   loperamide (IMODIUM) 2 MG capsule Take 1 capsule (2 mg total) by mouth 2 (two) times daily as needed for diarrhea or loose stools. 14 capsule 0   Misc. Devices MISC CPAP machine on autopap 5 to 15 1 each 0   Multiple Vitamin (MULTIVITAMIN WITH MINERALS) TABS tablet Take 2 tablets by mouth daily.      nicotine (NICODERM CQ) 14 mg/24hr patch Place 1 patch (14 mg total) onto the skin daily. Then decrease to 7 mg / 24 hours 28 patch 2   Omega-3 Fatty Acids (FISH OIL OMEGA-3 PO) Take by mouth daily.     traMADol (ULTRAM) 50 MG tablet TAKE ONE TABLET BY MOUTH AT BEDTIME AS NEEDED 30 tablet 1   zinc sulfate 220 (50 Zn) MG capsule Take 220 mg by mouth daily.     amLODipine (NORVASC) 10 MG tablet Take 1 tablet (10 mg total) by mouth daily. 90 tablet 1   atorvastatin (LIPITOR) 40 MG tablet Take 1 tablet (40 mg total) by mouth daily. 90 tablet 1   busPIRone (BUSPAR) 15 MG tablet Take 1 tablet (15 mg total) by mouth 2 (two) times daily. 180 tablet 1   cetirizine (ZYRTEC) 10 MG tablet TAKE 1 TABLET BY MOUTH DAILY 30 tablet 0   FLUoxetine (PROZAC) 20 MG tablet TAKE ONE TABLET BY MOUTH DAILY 30 tablet 2   fluticasone (FLONASE) 50 MCG/ACT nasal spray SPRAY TWO SPRAYS IN EACH NOSTRIL ONCE DAILY 16 mL 0   gabapentin (NEURONTIN) 300 MG capsule TAKE ONE CAPSULE BY MOUTH TWICE A DAY 60 capsule 3   hydrochlorothiazide (HYDRODIURIL) 25 MG tablet Take 1 tablet (25 mg total) by mouth daily. 90 tablet 1   meloxicam (MOBIC) 15 MG tablet Take 1 tablet (15 mg total) by mouth daily. 90 tablet 1   metFORMIN (GLUCOPHAGE) 500 MG tablet TAKE ONE TABLET BY MOUTH DAILY WITH BREAKFAST 90 tablet 0   methocarbamol (ROBAXIN) 500 MG tablet Take 1 tablet (500 mg total) by mouth every 8 (eight) hours as needed for  muscle spasms. 270 tablet 1   omeprazole (PRILOSEC) 40 MG capsule TAKE 1 CAPSULE BY MOUTH DAILY 90 capsule 1   benzonatate (TESSALON) 100 MG capsule Take 1-2 capsules (100-200 mg total) by mouth 3 (three) times daily as needed. (Patient not taking: Reported on 06/01/2020) 60 capsule 0   ondansetron (ZOFRAN-ODT) 8 MG  disintegrating tablet DISSOLVE ONE TABLET BY MOUTH EVERY 8 HOURS AS NEEDED FOR NAUSEA OR VOMITING (Patient not taking: Reported on 11/23/2021) 20 tablet 0   predniSONE (DELTASONE) 20 MG tablet Take 1 tablet (20 mg total) by mouth daily with breakfast. (Patient not taking: Reported on 11/23/2021) 5 tablet 0   promethazine-dextromethorphan (PROMETHAZINE-DM) 6.25-15 MG/5ML syrup Take 5 mLs by mouth at bedtime as needed for cough. (Patient not taking: Reported on 06/01/2020) 100 mL 0   No facility-administered medications prior to visit.     ROS Review of Systems  Constitutional:  Positive for chills and fatigue. Negative for activity change and appetite change.  HENT:  Negative for sinus pressure and sore throat.   Respiratory:  Positive for cough. Negative for chest tightness, shortness of breath and wheezing.   Cardiovascular:  Negative for chest pain and palpitations.  Gastrointestinal:  Negative for abdominal distention, abdominal pain and constipation.  Genitourinary: Negative.   Musculoskeletal:  Positive for myalgias.  Psychiatric/Behavioral:  Negative for behavioral problems and dysphoric mood.     Objective:  BP (!) 139/94   Pulse 97   Temp 99.3 F (37.4 C) (Oral)   Ht '5\' 7"'  (1.702 m)   Wt 195 lb (88.5 kg)   SpO2 98%   BMI 30.54 kg/m      11/23/2021   11:24 AM 05/18/2021   10:29 AM 06/01/2020    3:18 PM  BP/Weight  Systolic BP 700 174 944  Diastolic BP 94 96 92  Wt. (Lbs) 195 211.6 205.6  BMI 30.54 kg/m2 33.14 kg/m2 32.2 kg/m2      Physical Exam Constitutional:      Appearance: He is well-developed.  Cardiovascular:     Rate and Rhythm: Normal rate.      Heart sounds: Normal heart sounds. No murmur heard. Pulmonary:     Effort: Pulmonary effort is normal.     Breath sounds: Normal breath sounds. No wheezing or rales.  Chest:     Chest wall: No tenderness.  Abdominal:     General: Bowel sounds are normal. There is no distension.     Palpations: Abdomen is soft. There is no mass.     Tenderness: There is no abdominal tenderness.  Musculoskeletal:        General: Normal range of motion.     Right lower leg: No edema.     Left lower leg: No edema.  Neurological:     Mental Status: He is alert and oriented to person, place, and time.  Psychiatric:        Mood and Affect: Mood normal.        Latest Ref Rng & Units 05/18/2021   11:45 AM 06/01/2020    4:02 PM 12/02/2019    8:11 PM  CMP  Glucose 70 - 99 mg/dL 90  90  90   BUN 6 - 24 mg/dL '11  10  11   ' Creatinine 0.76 - 1.27 mg/dL 1.14  0.90  1.27   Sodium 134 - 144 mmol/L 143  142  140   Potassium 3.5 - 5.2 mmol/L 3.7  3.8  3.5   Chloride 96 - 106 mmol/L 100  101  101   CO2 20 - 29 mmol/L '25  25  27   ' Calcium 8.7 - 10.2 mg/dL 9.7  9.4  9.4   Total Protein 6.0 - 8.5 g/dL 6.9  6.7  7.2   Total Bilirubin 0.0 - 1.2 mg/dL 0.4  <0.2  0.4   Alkaline  Phos 44 - 121 IU/L 113  105  85   AST 0 - 40 IU/L '30  28  30   ' ALT 0 - 44 IU/L 37  39  35     Lipid Panel     Component Value Date/Time   CHOL 107 05/18/2021 1145   TRIG 215 (H) 05/18/2021 1145   HDL 35 (L) 05/18/2021 1145   CHOLHDL 5.1 (H) 07/23/2019 0950   LDLCALC 38 05/18/2021 1145    CBC    Component Value Date/Time   WBC 10.6 05/18/2021 1145   WBC 15.4 (H) 12/02/2019 2011   RBC 5.31 05/18/2021 1145   RBC 5.27 12/02/2019 2011   HGB 14.7 05/18/2021 1145   HCT 43.6 05/18/2021 1145   PLT 272 05/18/2021 1145   MCV 82 05/18/2021 1145   MCH 27.7 05/18/2021 1145   MCH 27.3 12/02/2019 2011   MCHC 33.7 05/18/2021 1145   MCHC 32.3 12/02/2019 2011   RDW 13.2 05/18/2021 1145   LYMPHSABS 3.1 05/18/2021 1145   MONOABS 0.8  10/18/2018 1953   EOSABS 0.3 05/18/2021 1145   BASOSABS 0.1 05/18/2021 1145    Lab Results  Component Value Date   HGBA1C 6.0 11/23/2021    Assessment & Plan:  1. Type 2 diabetes mellitus with other specified complication, without long-term current use of insulin (HCC) Controlled with A1c of 6.0 which has improved from 6.5 previously Counseled on Diabetic diet, my plate method, 195 minutes of moderate intensity exercise/week Blood sugar logs with fasting goals of 80-120 mg/dl, random of less than 180 and in the event of sugars less than 60 mg/dl or greater than 400 mg/dl encouraged to notify the clinic. Advised on the need for annual eye exams, annual foot exams, Pneumonia vaccine. - POCT glycosylated hemoglobin (Hb A1C) - Microalbumin/Creatinine Ratio, Urine - CMP14+EGFR - metFORMIN (GLUCOPHAGE) 500 MG tablet; Take 1 tablet (500 mg total) by mouth daily with breakfast.  Dispense: 90 tablet; Refill: 1  2. COVID-19 virus infection He thinks his home test was inconclusive Provided with mask in clinic and isolation encouraged We will repeat testing in the clinic He is out of the window for antiviral medication - chlorpheniramine-HYDROcodone (TUSSIONEX) 10-8 MG/5ML; Take 5 mLs by mouth every 12 (twelve) hours as needed for cough.  Dispense: 120 mL; Refill: 0 - Novel Coronavirus, NAA ([facility])  3. Hyperlipidemia associated with type 2 diabetes mellitus (HCC) Controlled Low-cholesterol diet - atorvastatin (LIPITOR) 40 MG tablet; Take 1 tablet (40 mg total) by mouth daily.  Dispense: 90 tablet; Refill: 1  4. Anxiety and depression Controlled - busPIRone (BUSPAR) 15 MG tablet; Take 1 tablet (15 mg total) by mouth 2 (two) times daily.  Dispense: 180 tablet; Refill: 1 - FLUoxetine (PROZAC) 20 MG tablet; Take 1 tablet (20 mg total) by mouth daily.  Dispense: 90 tablet; Refill: 1  5. Seasonal allergic rhinitis due to other allergic trigger Stable - cetirizine (ZYRTEC) 10 MG tablet; Take  1 tablet (10 mg total) by mouth daily.  Dispense: 30 tablet; Refill: 0 - fluticasone (FLONASE) 50 MCG/ACT nasal spray; Place 1 spray into both nostrils daily.  Dispense: 16 mL; Refill: 6  6. Hypertension associated with diabetes ([place]) Slightly above goal Consider switching to chlorthalidone at next visit if still above goal; we will make no regimen changes today due to the fact that he is acutely ill Counseled on Diabetic diet, my plate method, 093 minutes of moderate intensity exercise/week Blood sugar logs with fasting goals of 80-120 mg/dl, random  of less than 180 and in the event of sugars less than 60 mg/dl or greater than 400 mg/dl encouraged to notify the clinic. Advised on the need for annual eye exams, annual foot exams, Pneumonia vaccine. - amLODipine (NORVASC) 10 MG tablet; Take 1 tablet (10 mg total) by mouth daily.  Dispense: 90 tablet; Refill: 1 - hydrochlorothiazide (HYDRODIURIL) 25 MG tablet; Take 1 tablet (25 mg total) by mouth daily.  Dispense: 90 tablet; Refill: 1  7. Gastroesophageal reflux disease without esophagitis Controlled - omeprazole (PRILOSEC) 40 MG capsule; Take 1 capsule (40 mg total) by mouth daily.  Dispense: 90 capsule; Refill: 1  8. Muscle spasm Stable - gabapentin (NEURONTIN) 300 MG capsule; Take 1 capsule (300 mg total) by mouth 2 (two) times daily.  Dispense: 60 capsule; Refill: 3 - meloxicam (MOBIC) 15 MG tablet; Take 1 tablet (15 mg total) by mouth daily.  Dispense: 90 tablet; Refill: 1 - methocarbamol (ROBAXIN) 500 MG tablet; Take 1 tablet (500 mg total) by mouth every 8 (eight) hours as needed for muscle spasms.  Dispense: 270 tablet; Refill: 1    Meds ordered this encounter  Medications   chlorpheniramine-HYDROcodone (TUSSIONEX) 10-8 MG/5ML    Sig: Take 5 mLs by mouth every 12 (twelve) hours as needed for cough.    Dispense:  120 mL    Refill:  0   atorvastatin (LIPITOR) 40 MG tablet    Sig: Take 1 tablet (40 mg total) by mouth daily.     Dispense:  90 tablet    Refill:  1   busPIRone (BUSPAR) 15 MG tablet    Sig: Take 1 tablet (15 mg total) by mouth 2 (two) times daily.    Dispense:  180 tablet    Refill:  1   cetirizine (ZYRTEC) 10 MG tablet    Sig: Take 1 tablet (10 mg total) by mouth daily.    Dispense:  30 tablet    Refill:  0   amLODipine (NORVASC) 10 MG tablet    Sig: Take 1 tablet (10 mg total) by mouth daily.    Dispense:  90 tablet    Refill:  1    Dose increase   FLUoxetine (PROZAC) 20 MG tablet    Sig: Take 1 tablet (20 mg total) by mouth daily.    Dispense:  90 tablet    Refill:  1   fluticasone (FLONASE) 50 MCG/ACT nasal spray    Sig: Place 1 spray into both nostrils daily.    Dispense:  16 mL    Refill:  6   gabapentin (NEURONTIN) 300 MG capsule    Sig: Take 1 capsule (300 mg total) by mouth 2 (two) times daily.    Dispense:  60 capsule    Refill:  3   hydrochlorothiazide (HYDRODIURIL) 25 MG tablet    Sig: Take 1 tablet (25 mg total) by mouth daily.    Dispense:  90 tablet    Refill:  1   meloxicam (MOBIC) 15 MG tablet    Sig: Take 1 tablet (15 mg total) by mouth daily.    Dispense:  90 tablet    Refill:  1   metFORMIN (GLUCOPHAGE) 500 MG tablet    Sig: Take 1 tablet (500 mg total) by mouth daily with breakfast.    Dispense:  90 tablet    Refill:  1   methocarbamol (ROBAXIN) 500 MG tablet    Sig: Take 1 tablet (500 mg total) by mouth every 8 (eight) hours as needed  for muscle spasms.    Dispense:  270 tablet    Refill:  1   omeprazole (PRILOSEC) 40 MG capsule    Sig: Take 1 capsule (40 mg total) by mouth daily.    Dispense:  90 capsule    Refill:  1    Follow-up: Return in about 6 months (around 05/25/2022) for Chronic medical conditions.       Charlott Rakes, MD, FAAFP. [facility] and [place], [place]   11/23/2021, 12:35 PM

## 2021-11-24 ENCOUNTER — Encounter (HOSPITAL_BASED_OUTPATIENT_CLINIC_OR_DEPARTMENT_OTHER): Payer: BC Managed Care – PPO | Admitting: Family Medicine

## 2021-11-24 ENCOUNTER — Encounter: Payer: Self-pay | Admitting: Family Medicine

## 2021-11-24 DIAGNOSIS — K047 Periapical abscess without sinus: Secondary | ICD-10-CM

## 2021-11-24 LAB — CMP14+EGFR
ALT: 27 IU/L (ref 0–44)
AST: 26 IU/L (ref 0–40)
Albumin/Globulin Ratio: 2 (ref 1.2–2.2)
Albumin: 4.9 g/dL (ref 4.1–5.1)
Alkaline Phosphatase: 109 IU/L (ref 44–121)
BUN/Creatinine Ratio: 12 (ref 9–20)
BUN: 12 mg/dL (ref 6–24)
Bilirubin Total: 0.4 mg/dL (ref 0.0–1.2)
CO2: 23 mmol/L (ref 20–29)
Calcium: 10.2 mg/dL (ref 8.7–10.2)
Chloride: 98 mmol/L (ref 96–106)
Creatinine, Ser: 1.04 mg/dL (ref 0.76–1.27)
Globulin, Total: 2.4 g/dL (ref 1.5–4.5)
Glucose: 95 mg/dL (ref 70–99)
Potassium: 3.8 mmol/L (ref 3.5–5.2)
Sodium: 140 mmol/L (ref 134–144)
Total Protein: 7.3 g/dL (ref 6.0–8.5)
eGFR: 87 mL/min/{1.73_m2} (ref >59)

## 2021-11-24 LAB — MICROALBUMIN / CREATININE URINE RATIO
Creatinine, Urine: 265 mg/dL
Microalb/Creat Ratio: 6 mg/g creat (ref 0–29)
Microalbumin, Urine: 16.9 ug/mL

## 2021-11-24 LAB — NOVEL CORONAVIRUS, NAA: SARS-CoV-2, NAA: NOT DETECTED

## 2021-11-27 ENCOUNTER — Other Ambulatory Visit (INDEPENDENT_AMBULATORY_CARE_PROVIDER_SITE_OTHER): Payer: Self-pay

## 2021-11-27 MED ORDER — CLINDAMYCIN HCL 300 MG PO CAPS: 300.0000 mg | ORAL_CAPSULE | Freq: Three times a day (TID) | ORAL | 0 refills | Status: DC

## 2021-11-27 MED ORDER — PROMETHAZINE HCL 25 MG PO TABS: 25.0000 mg | ORAL_TABLET | Freq: Three times a day (TID) | ORAL | 0 refills | Status: DC | PRN

## 2021-11-27 NOTE — Telephone Encounter (Signed)

## 2021-12-09 DIAGNOSIS — G4733 Obstructive sleep apnea (adult) (pediatric): Secondary | ICD-10-CM | POA: Diagnosis not present

## 2021-12-28 ENCOUNTER — Encounter: Payer: Self-pay | Admitting: Family Medicine

## 2021-12-29 ENCOUNTER — Other Ambulatory Visit: Payer: Self-pay | Admitting: Family Medicine

## 2021-12-29 DIAGNOSIS — M170 Bilateral primary osteoarthritis of knee: Secondary | ICD-10-CM

## 2022-01-09 DIAGNOSIS — G4733 Obstructive sleep apnea (adult) (pediatric): Secondary | ICD-10-CM | POA: Diagnosis not present

## 2022-01-15 ENCOUNTER — Encounter: Payer: Self-pay | Admitting: Orthopedic Surgery

## 2022-01-15 ENCOUNTER — Ambulatory Visit (INDEPENDENT_AMBULATORY_CARE_PROVIDER_SITE_OTHER): Payer: BC Managed Care – PPO | Admitting: Surgical

## 2022-01-15 DIAGNOSIS — M25562 Pain in left knee: Secondary | ICD-10-CM | POA: Diagnosis not present

## 2022-01-15 DIAGNOSIS — M25561 Pain in right knee: Secondary | ICD-10-CM | POA: Diagnosis not present

## 2022-01-15 DIAGNOSIS — M25569 Pain in unspecified knee: Secondary | ICD-10-CM

## 2022-01-15 MED ORDER — BUPIVACAINE HCL 0.25 % IJ SOLN
4.0000 mL | INTRAMUSCULAR | Status: AC | PRN
  Administered 2022-01-15: 4 mL via INTRA_ARTICULAR

## 2022-01-15 MED ORDER — LIDOCAINE HCL 1 % IJ SOLN
5.0000 mL | INTRAMUSCULAR | Status: AC | PRN
  Administered 2022-01-15: 5 mL

## 2022-01-15 MED ORDER — METHYLPREDNISOLONE ACETATE 40 MG/ML IJ SUSP
40.0000 mg | INTRAMUSCULAR | Status: AC | PRN
  Administered 2022-01-15: 40 mg via INTRA_ARTICULAR

## 2022-01-15 NOTE — Progress Notes (Signed)
Office Visit Note   Patient: Timothy Mccarthy           Date of Birth: Jan 10, 1971           MRN: [id number] Visit Date: 01/15/2022 Requested by: Charlott Rakes, MD [address],  [place] [address] PCP: Charlott Rakes, MD  Subjective: Chief Complaint  Patient presents with   Right Knee - Pain   Left Knee - Pain    HPI: Timothy Mccarthy is a 51 y.o. male who presents to the office reporting bilateral knee pain, right greater than left.  No recent injury.  Last seen by Dr. Marlou Sa on 02/24/2021 with right knee injection at that time that provided 11 months of relief.  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  Does note a popping sensation in the knees.  Localizes pain to the posterior aspect of both knees.  Takes Tylenol for pain.  Cannot take NSAIDs due to hypertension.  No history of prior surgery to the knee.  No new low back pain, groin pain, radicular pain.  Does have history of diabetes which is overall well-controlled with last A1c less than 6.  Does have some pins and needle sensation in bilateral feet but not throughout the rest of the legs..                ROS: All systems reviewed are negative as they relate to the chief complaint within the history of present illness.  Patient denies fevers or chills.  Assessment & Plan: Visit Diagnoses:  1. Medial joint line tenderness of knee, unspecified laterality     Plan: Patient is a 51 year old male who presents for evaluation of bilateral knee pain.  Right knee injection in January 2023 provided excellent relief for 11 months.  He has previous radiographs of both knees demonstrating really no significant degenerative changes or acute abnormalities.  Does have tenderness over the medial joint line in both knees.  Discussed options available patient.  Does not want to do physical therapy as he has done that in the past without much relief.  He  would like to try bilateral knee injections today.  These were administered and patient tolerated procedure well.  Right knee injection administered by myself with left knee injection administered by Barnet Pall, NP.  Plan is for patient to follow-up as needed and he will reach out if he has continued symptoms for 6 weeks or now.  Next step would be MRI for further evaluation of meniscal pathology versus occult arthritis.  Follow-Up Instructions: No follow-ups on file.   Orders:  No orders of the defined types were placed in this encounter.  No orders of the defined types were placed in this encounter.     Procedures: Large Joint Inj: bilateral knee on 01/15/2022 5:18 PM Indications: diagnostic evaluation, joint swelling and pain Details: 18 G 1.5 in needle, superolateral approach  Arthrogram: No  Medications (Right): 5 mL lidocaine 1 %; 4 mL bupivacaine 0.25 %; 40 mg methylPREDNISolone acetate 40 MG/ML Medications (Left): 5 mL lidocaine 1 %; 4 mL bupivacaine 0.25 %; 40 mg methylPREDNISolone acetate 40 MG/ML Outcome: tolerated well, no immediate complications Procedure, treatment alternatives, risks and benefits explained, specific risks discussed. Consent was given by the patient. Immediately prior to procedure a time out was called to verify the correct patient, procedure, equipment, support staff and site/side marked as  required. Patient was prepped and draped in the usual sterile fashion.       Clinical Data: No additional findings.  Objective: Vital Signs: There were no vitals taken for this visit.  Physical Exam:  Constitutional: Patient appears well-developed HEENT:  Head: Normocephalic Eyes:EOM are normal Neck: Normal range of motion Cardiovascular: Normal rate Pulmonary/chest: Effort normal Neurologic: Patient is alert Skin: Skin is warm Psychiatric: Patient has normal mood and affect  Ortho Exam: Ortho exam demonstrates left and right knees with 0 degrees  extension and 125 degrees of knee flexion.  No calf tenderness.  Negative Homans' sign.  No pain with hip range of motion.  Able to perform straight leg raise.  Tenderness over the medial joint lines of both knees with not really any tenderness over the lateral joint line.  No tenderness over the pes anserine bursae.  No knee effusion present in either knee.  No cellulitis or skin changes noted.  Specialty Comments:  No specialty comments available.  Imaging: No results found.   PMFS History: Patient Active Problem List   Diagnosis Date Noted   Chronic pain of both knees 11/26/2017   Hyperlipidemia 12/25/2016   Anxiety 06/06/2016   Essential hypertension 11/04/2015   Abdominal hernia 09/28/2015   Appendicitis 09/15/2015   Tobacco use disorder 04/21/2015   Adjustment disorder with anxious mood 04/21/2015   Health care maintenance 04/21/2015   GERD (gastroesophageal reflux disease) 04/21/2015   Past Medical History:  Diagnosis Date   Adjustment disorder with anxious mood    Allergy    Anxiety    Arthritis    "hands" (09/28/2015)   Depression    GERD (gastroesophageal reflux disease)    Hyperlipidemia    Hypertension    Sleep apnea    age 6    Tobacco use disorder     Family History  Problem Relation Age of Onset   Stroke Mother    Diabetes type II Mother    Diabetes Mother    Colon polyps Neg Hx    Esophageal cancer Neg Hx    Rectal cancer Neg Hx    Stomach cancer Neg Hx     Past Surgical History:  Procedure Laterality Date   APPENDECTOMY  09/15/2015   CARPAL TUNNEL RELEASE Bilateral    INCISIONAL HERNIA REPAIR N/A 09/29/2015   Procedure: HERNIA REPAIR INCISIONAL;  Surgeon: Erroll Luna, MD;  Location: [place];  Service: General;  Laterality: N/A;   LAPAROSCOPIC APPENDECTOMY N/A 09/15/2015   Procedure: APPENDECTOMY LAPAROSCOPIC;  Surgeon: Judeth Horn, MD;  Location: [place];  Service: General;  Laterality: N/A;   LUMPS ON BACK  2013   WISDOM TOOTH EXTRACTION      Social History   Occupational History   Not on file  Tobacco Use   Smoking status: Every Day    Packs/day: 0.50    Years: 23.00    Total pack years: 11.50    Types: Cigarettes    Start date: 04/21/1987   Smokeless tobacco: Never   Tobacco comments:    Patches   Vaping Use   Vaping Use: Never used  Substance and Sexual Activity   Alcohol use: Yes    Alcohol/week: 4.0 standard drinks of alcohol    Types: 2 Cans of beer, 2 Shots of liquor per week    Comment: "takes shots whenever has pain"   Drug use: No   Sexual activity: Yes

## 2022-02-01 DIAGNOSIS — K0889 Other specified disorders of teeth and supporting structures: Secondary | ICD-10-CM | POA: Diagnosis not present

## 2022-03-05 ENCOUNTER — Encounter: Payer: Self-pay | Admitting: Family Medicine

## 2022-03-06 ENCOUNTER — Other Ambulatory Visit: Payer: Self-pay | Admitting: Family Medicine

## 2022-03-06 DIAGNOSIS — G8929 Other chronic pain: Secondary | ICD-10-CM

## 2022-03-09 ENCOUNTER — Other Ambulatory Visit: Payer: Self-pay | Admitting: Pharmacist

## 2022-03-09 ENCOUNTER — Ambulatory Visit: Payer: Self-pay | Admitting: *Deleted

## 2022-03-09 DIAGNOSIS — J3089 Other allergic rhinitis: Secondary | ICD-10-CM

## 2022-03-09 MED ORDER — FLUTICASONE PROPIONATE 50 MCG/ACT NA SUSP: 2.0000 | Freq: Every day | NASAL | 1 refills | Status: DC

## 2022-03-09 NOTE — Telephone Encounter (Signed)
   Per agent:  Meadow from [facility] called in states pt told her that he does 2 sprays in each nosal a day. She wants to clarify if this is correct since it takes do one spray each a day. Please call patient back to confirm becuae he is out of the med because of this.      Called and spoke with pt. States he has been using 2 sprays into each nare and is out of med.  States "The doctor told me two sprays each nostril." Pt is out of medication. Please advise. Reason for Disposition  [1] Caller has NON-URGENT medicine question about med that PCP prescribed AND [2] triager unable to answer question  Answer Assessment - Initial Assessment Questions 1. NAME of MEDICINE: "What medicine(s) are you calling about?"     Flonase 2. QUESTION: "What is your question?" (e.g., double dose of medicine, side effect)   Dosage ordered 3. PRESCRIBER: "Who prescribed the medicine?" Reason: if prescribed by specialist, call should be referred to that group.     PCP  Protocols used: Medication Question Call-A-AH

## 2022-04-17 ENCOUNTER — Encounter: Payer: Self-pay | Admitting: Family Medicine

## 2022-04-30 ENCOUNTER — Encounter: Payer: Self-pay | Admitting: Family Medicine

## 2022-05-04 ENCOUNTER — Encounter: Payer: Self-pay | Admitting: Family Medicine

## 2022-05-04 DIAGNOSIS — L989 Disorder of the skin and subcutaneous tissue, unspecified: Secondary | ICD-10-CM | POA: Diagnosis not present

## 2022-05-06 ENCOUNTER — Other Ambulatory Visit: Payer: Self-pay | Admitting: Family Medicine

## 2022-05-06 DIAGNOSIS — J3089 Other allergic rhinitis: Secondary | ICD-10-CM

## 2022-05-28 ENCOUNTER — Ambulatory Visit: Payer: BC Managed Care – PPO | Admitting: Family Medicine

## 2022-06-01 ENCOUNTER — Other Ambulatory Visit: Payer: Self-pay | Admitting: Family Medicine

## 2022-06-01 DIAGNOSIS — J3089 Other allergic rhinitis: Secondary | ICD-10-CM

## 2022-06-04 ENCOUNTER — Other Ambulatory Visit: Payer: Self-pay | Admitting: Family Medicine

## 2022-06-04 DIAGNOSIS — J3089 Other allergic rhinitis: Secondary | ICD-10-CM

## 2022-06-05 NOTE — Telephone Encounter (Signed)
Requested Prescriptions  Pending Prescriptions Disp Refills   cetirizine (ZYRTEC) 10 MG tablet [Pharmacy Med Name: CETIRIZINE HCL 10 MG TABLET] 90 tablet 1    Sig: TAKE 1 TABLET BY MOUTH DAILY     Ear, Nose, and Throat:  Antihistamines 2 Passed - 06/04/2022 11:19 AM      Passed - Cr in normal range and within 360 days    Creat  Date Value Ref Range Status  03/14/2016 1.36 (H) 0.60 - 1.35 mg/dL Final   Creatinine, Ser  Date Value Ref Range Status  11/23/2021 1.04 0.76 - 1.27 mg/dL Final         Passed - Valid encounter within last 12 months    Recent Outpatient Visits           6 months ago Type 2 diabetes mellitus with other specified complication, without long-term current use of insulin (HCC)   [place] [facility] [place], [place], MD   1 year ago Type 2 diabetes mellitus with other specified complication, without long-term current use of insulin (HCC)   [place] [facility] Hoy Register, MD   1 year ago Essential hypertension   [place] [facility] Hoy Register, MD   2 years ago Periumbilical abdominal pain   [place] [facility] Hoy Register, MD   2 years ago Need for hepatitis C screening test   [facility] Hoy Register, MD       Future Appointments             In 1 month Hoy Register, MD [facility]

## 2022-06-10 ENCOUNTER — Other Ambulatory Visit: Payer: Self-pay | Admitting: Family Medicine

## 2022-06-10 DIAGNOSIS — F32A Depression, unspecified: Secondary | ICD-10-CM

## 2022-06-10 DIAGNOSIS — E1159 Type 2 diabetes mellitus with other circulatory complications: Secondary | ICD-10-CM

## 2022-06-22 ENCOUNTER — Other Ambulatory Visit: Payer: Self-pay

## 2022-06-22 NOTE — Progress Notes (Signed)
   Timothy Mccarthy [facility] 04-Sep-1970 [id number]  Pt has not had BP checked in clinic since 11/2021. Pt needs BP cuff and financial counseling.  Patient outreached by Francetta Found, PharmD Candidate on 06/22/2022 while they were picking up prescriptions at [facility].  Blood Pressure Readings: Last documented ambulatory systolic blood pressure: 139 Last documented ambulatory diastolic blood pressure: 94 Does the patient have a validated home blood pressure machine?: No   Medication review was performed. Is the patient taking their medications as prescribed?: Yes Differences from their prescribed list include: noen  The following barriers to adherence were noted: Does the patient have cost concerns?: Yes [employer] prices went up, due to job switch) Does the patient have transportation concerns?: No Does the patient need assistance obtaining refills?: Yes Does the patient occassionally forget to take some of their prescribed medications?: No Does the patient feel like one/some of their medications make them feel poorly?: Yes (Metformin GI side effects) Does the patient have questions or concerns about their medications?: No Does the patient have a follow up scheduled with their primary care provider/cardiologist?: Yes   Interventions: Interventions Completed: Medications were reviewed, Patient was educated on goal blood pressures and long term health implications of elevated blood pressure, Patient was educated on proper technique to check home blood pressure and reminded to bring home machine and readings to next provider appointment  The patient has follow up scheduled:  PCP: Hoy Register, MD   Francetta Found, Student-PharmD

## 2022-06-26 ENCOUNTER — Ambulatory Visit: Payer: BC Managed Care – PPO | Attending: Family Medicine | Admitting: Family Medicine

## 2022-06-26 ENCOUNTER — Encounter: Payer: Self-pay | Admitting: Family Medicine

## 2022-06-26 VITALS — BP 129/85 | HR 97 | Ht 67.0 in | Wt 191.6 lb

## 2022-06-26 DIAGNOSIS — D1779 Benign lipomatous neoplasm of other sites: Secondary | ICD-10-CM | POA: Diagnosis not present

## 2022-06-26 DIAGNOSIS — E1169 Type 2 diabetes mellitus with other specified complication: Secondary | ICD-10-CM | POA: Diagnosis not present

## 2022-06-26 DIAGNOSIS — Z7984 Long term (current) use of oral hypoglycemic drugs: Secondary | ICD-10-CM

## 2022-06-26 DIAGNOSIS — E119 Type 2 diabetes mellitus without complications: Secondary | ICD-10-CM | POA: Insufficient documentation

## 2022-06-26 DIAGNOSIS — E1159 Type 2 diabetes mellitus with other circulatory complications: Secondary | ICD-10-CM

## 2022-06-26 DIAGNOSIS — F32A Depression, unspecified: Secondary | ICD-10-CM

## 2022-06-26 DIAGNOSIS — E785 Hyperlipidemia, unspecified: Secondary | ICD-10-CM | POA: Diagnosis not present

## 2022-06-26 DIAGNOSIS — J3089 Other allergic rhinitis: Secondary | ICD-10-CM

## 2022-06-26 DIAGNOSIS — I152 Hypertension secondary to endocrine disorders: Secondary | ICD-10-CM

## 2022-06-26 DIAGNOSIS — M62838 Other muscle spasm: Secondary | ICD-10-CM

## 2022-06-26 DIAGNOSIS — F419 Anxiety disorder, unspecified: Secondary | ICD-10-CM

## 2022-06-26 DIAGNOSIS — M159 Polyosteoarthritis, unspecified: Secondary | ICD-10-CM

## 2022-06-26 LAB — POCT GLYCOSYLATED HEMOGLOBIN (HGB A1C): HbA1c, POC (controlled diabetic range): 5.7 % (ref 0.0–7.0)

## 2022-06-26 MED ORDER — AMLODIPINE BESYLATE 10 MG PO TABS: 10.0000 mg | ORAL_TABLET | Freq: Every day | ORAL | 1 refills | Status: DC

## 2022-06-26 MED ORDER — ATORVASTATIN CALCIUM 40 MG PO TABS: 40.0000 mg | ORAL_TABLET | Freq: Every day | ORAL | 1 refills | Status: DC

## 2022-06-26 MED ORDER — HYDROCHLOROTHIAZIDE 25 MG PO TABS: 25.0000 mg | ORAL_TABLET | Freq: Every day | ORAL | 1 refills | Status: DC

## 2022-06-26 MED ORDER — MELOXICAM 15 MG PO TABS: 15.0000 mg | ORAL_TABLET | Freq: Every day | ORAL | 1 refills | Status: DC

## 2022-06-26 MED ORDER — FLUOXETINE HCL 20 MG PO TABS: 20.0000 mg | ORAL_TABLET | Freq: Every day | ORAL | 0 refills | Status: DC

## 2022-06-26 MED ORDER — CETIRIZINE HCL 10 MG PO TABS: 10.0000 mg | ORAL_TABLET | Freq: Every day | ORAL | 1 refills | Status: DC

## 2022-06-26 NOTE — Progress Notes (Signed)
Knot on back Metformin causing nausea and diarrhea.

## 2022-06-26 NOTE — Progress Notes (Signed)
Subjective:  Patient ID: Timothy Mccarthy, male    DOB: 1970-10-01  Age: 52 y.o. MRN: [id number]  CC: Hypertension   HPI Timothy Mccarthy is a 52 y.o. year old male with a history of hypertension, GERD, anxiety and depression, Osteoarthritis of the knee , laparoscopic umbilical hernia repair at [facility], type 2 diabetes mellitus (A1c 5.7) He recently relocated from [facility] to [place].  Interval History:  Metformin has been causing diarrhea and nausea and so he has been taking it every other day.  Metformin has also affected his appetite. A1c is 5.7 down from 6.0.  He has no blurry vision or neuropathy and does not exercise regularly. He has body aches when it rains.  He has arthritis in his hands and his knees continue to bother him.  Initially corticosteroid injections in his knee did provide relief but with the last one, it lasted only 1 month.  He wears knee braces regularly.  Reflux symptoms are controlled and his anxiety is also controlled. Past Medical History:  Diagnosis Date   Adjustment disorder with anxious mood    Allergy    Anxiety    Arthritis    "hands" (09/28/2015)   Depression    GERD (gastroesophageal reflux disease)    Hyperlipidemia    Hypertension    Sleep apnea    age 3    Tobacco use disorder     Past Surgical History:  Procedure Laterality Date   APPENDECTOMY  09/15/2015   CARPAL TUNNEL RELEASE Bilateral    INCISIONAL HERNIA REPAIR N/A 09/29/2015   Procedure: HERNIA REPAIR INCISIONAL;  Surgeon: Harriette Bouillon, MD;  Location: [facility] OR;  Service: General;  Laterality: N/A;   LAPAROSCOPIC APPENDECTOMY N/A 09/15/2015   Procedure: APPENDECTOMY LAPAROSCOPIC;  Surgeon: Jimmye Norman, MD;  Location[facility] OR;  Service: General;  Laterality: N/A;   LUMPS ON BACK  2013   WISDOM TOOTH EXTRACTION      Family History  Problem Relation Age of Onset   Stroke Mother    Diabetes type II Mother    Diabetes Mother    Colon polyps Neg Hx    Esophageal cancer  Neg Hx    Rectal cancer Neg Hx    Stomach cancer Neg Hx     Social History   Socioeconomic History   Marital status: Significant Other    Spouse name: Not on file   Number of children: Not on file   Years of education: Not on file   Highest education level: Not on file  Occupational History   Not on file  Tobacco Use   Smoking status: Every Day    Packs/day: 0.50    Years: 23.00    Additional pack years: 0.00    Total pack years: 11.50    Types: Cigarettes    Start date: 04/21/1987   Smokeless tobacco: Never   Tobacco comments:    Patches   Vaping Use   Vaping Use: Never used  Substance and Sexual Activity   Alcohol use: Yes    Alcohol/week: 4.0 standard drinks of alcohol    Types: 2 Cans of beer, 2 Shots of liquor per week    Comment: "takes shots whenever has pain"   Drug use: No   Sexual activity: Yes  Other Topics Concern   Not on file  Social History Narrative   He lives with his wife in [place] and 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   Social Determinants of Health  Financial Resource Strain: Not on file  Food Insecurity: Not on file  Transportation Needs: Not on file  Physical Activity: Not on file  Stress: Not on file  Social Connections: Not on file    Allergies  Allergen Reactions   Penicillins Anaphylaxis    Has patient had a PCN reaction causing immediate rash, facial/tongue/throat swelling, SOB or lightheadedness with hypotensionNO Has patient had a PCN reaction causing severe rash involving mucus membranes or skin necrosis: NO Has patient had a PCN reaction that required hospitalization NO Has patient had a PCN reaction occurring within the last 10 years: NO If all of the above answers are "NO", then may proceed with Cephalosporin use.    Outpatient Medications Prior to Visit  Medication Sig Dispense Refill   benzonatate (TESSALON) 100 MG capsule Take 1-2 capsules (100-200 mg total) by mouth 3 (three) times daily as  needed. 60 capsule 0   busPIRone (BUSPAR) 15 MG tablet Take 1 tablet (15 mg total) by mouth 2 (two) times daily. 180 tablet 1   chlorpheniramine-HYDROcodone (TUSSIONEX) 10-8 MG/5ML Take 5 mLs by mouth every 12 (twelve) hours as needed for cough. 120 mL 0   clindamycin (CLEOCIN) 300 MG capsule Take 1 capsule (300 mg total) by mouth 3 (three) times daily. 30 capsule 0   fluticasone (FLONASE) 50 MCG/ACT nasal spray SPRAY 2 SPRAYS IN EACH NOSTRIL ONCE DAILY 16 mL 0   gabapentin (NEURONTIN) 300 MG capsule Take 1 capsule (300 mg total) by mouth 2 (two) times daily. 60 capsule 3   loperamide (IMODIUM) 2 MG capsule Take 1 capsule (2 mg total) by mouth 2 (two) times daily as needed for diarrhea or loose stools. 14 capsule 0   methocarbamol (ROBAXIN) 500 MG tablet Take 1 tablet (500 mg total) by mouth every 8 (eight) hours as needed for muscle spasms. 270 tablet 1   Misc. Devices MISC CPAP machine on autopap 5 to 15 1 each 0   Multiple Vitamin (MULTIVITAMIN WITH MINERALS) TABS tablet Take 2 tablets by mouth daily.      nicotine (NICODERM CQ) 14 mg/24hr patch Place 1 patch (14 mg total) onto the skin daily. Then decrease to 7 mg / 24 hours 28 patch 2   Omega-3 Fatty Acids (FISH OIL OMEGA-3 PO) Take by mouth daily.     omeprazole (PRILOSEC) 40 MG capsule Take 1 capsule (40 mg total) by mouth daily. 90 capsule 1   ondansetron (ZOFRAN-ODT) 8 MG disintegrating tablet DISSOLVE ONE TABLET BY MOUTH EVERY 8 HOURS AS NEEDED FOR NAUSEA OR VOMITING 20 tablet 0   predniSONE (DELTASONE) 20 MG tablet Take 1 tablet (20 mg total) by mouth daily with breakfast. 5 tablet 0   promethazine (PHENERGAN) 25 MG tablet Take 1 tablet (25 mg total) by mouth every 8 (eight) hours as needed for nausea or vomiting. 20 tablet 0   traMADol (ULTRAM) 50 MG tablet TAKE ONE TABLET BY MOUTH AT BEDTIME AS NEEDED 30 tablet 1   zinc sulfate 220 (50 Zn) MG capsule Take 220 mg by mouth daily.     amLODipine (NORVASC) 10 MG tablet Take 1 tablet (10 mg  total) by mouth daily. 90 tablet 1   atorvastatin (LIPITOR) 40 MG tablet Take 1 tablet (40 mg total) by mouth daily. 90 tablet 1   cetirizine (ZYRTEC) 10 MG tablet TAKE 1 TABLET BY MOUTH DAILY 90 tablet 1   FLUoxetine (PROZAC) 20 MG tablet TAKE 1 TABLET BY MOUTH DAILY 90 tablet 0   hydrochlorothiazide (HYDRODIURIL)  25 MG tablet TAKE 1 TABLET BY MOUTH DAILY 90 tablet 0   meloxicam (MOBIC) 15 MG tablet Take 1 tablet (15 mg total) by mouth daily. 90 tablet 1   metFORMIN (GLUCOPHAGE) 500 MG tablet Take 1 tablet (500 mg total) by mouth daily with breakfast. 90 tablet 1   No facility-administered medications prior to visit.     ROS Review of Systems  Constitutional:  Negative for activity change and appetite change.  HENT:  Negative for sinus pressure and sore throat.   Respiratory:  Negative for chest tightness, shortness of breath and wheezing.   Cardiovascular:  Negative for chest pain and palpitations.  Gastrointestinal:  Negative for abdominal distention, abdominal pain and constipation.  Genitourinary: Negative.   Musculoskeletal:        See HPI  Psychiatric/Behavioral:  Negative for behavioral problems and dysphoric mood.     Objective:  BP 129/85   Pulse 97   Ht 5\' 7"  (1.702 m)   Wt 191 lb 9.6 oz (86.9 kg)   SpO2 99%   BMI 30.01 kg/m      06/26/2022    3:59 PM 11/23/2021   11:24 AM 05/18/2021   10:29 AM  BP/Weight  Systolic BP 129 139 143  Diastolic BP 85 94 96  Wt. (Lbs) 191.6 195 211.6  BMI 30.01 kg/m2 30.54 kg/m2 33.14 kg/m2      Physical Exam Constitutional:      Appearance: He is well-developed.  Cardiovascular:     Rate and Rhythm: Normal rate.     Heart sounds: Normal heart sounds. No murmur heard. Pulmonary:     Effort: Pulmonary effort is normal.     Breath sounds: Normal breath sounds. No wheezing or rales.  Chest:     Chest wall: No tenderness.  Abdominal:     General: Bowel sounds are normal. There is no distension.     Palpations: Abdomen is  soft. There is no mass.     Tenderness: There is no abdominal tenderness.  Musculoskeletal:        General: Normal range of motion.     Right lower leg: No edema.     Left lower leg: No edema.     Comments: Right posterior back with soft slightly tender mobile mass  Neurological:     Mental Status: He is alert and oriented to person, place, and time.  Psychiatric:        Mood and Affect: Mood normal.        Latest Ref Rng & Units 11/23/2021   12:08 PM 05/18/2021   11:45 AM 06/01/2020    4:02 PM  CMP  Glucose 70 - 99 mg/dL 95  90  90   BUN 6 - 24 mg/dL 12  11  10    Creatinine 0.76 - 1.27 mg/dL 0.98  1.19  1.47   Sodium 134 - 144 mmol/L 140  143  142   Potassium 3.5 - 5.2 mmol/L 3.8  3.7  3.8   Chloride 96 - 106 mmol/L 98  100  101   CO2 20 - 29 mmol/L 23  25  25    Calcium 8.7 - 10.2 mg/dL 82.9  9.7  9.4   Total Protein 6.0 - 8.5 g/dL 7.3  6.9  6.7   Total Bilirubin 0.0 - 1.2 mg/dL 0.4  0.4  <5.6   Alkaline Phos 44 - 121 IU/L 109  113  105   AST 0 - 40 IU/L 26  30  28  ALT 0 - 44 IU/L 27  37  39     Lipid Panel     Component Value Date/Time   CHOL 107 05/18/2021 1145   TRIG 215 (H) 05/18/2021 1145   HDL 35 (L) 05/18/2021 1145   CHOLHDL 5.1 (H) 07/23/2019 0950   LDLCALC 38 05/18/2021 1145    CBC    Component Value Date/Time   WBC 10.6 05/18/2021 1145   WBC 15.4 (H) 12/02/2019 2011   RBC 5.31 05/18/2021 1145   RBC 5.27 12/02/2019 2011   HGB 14.7 05/18/2021 1145   HCT 43.6 05/18/2021 1145   PLT 272 05/18/2021 1145   MCV 82 05/18/2021 1145   MCH 27.7 05/18/2021 1145   MCH 27.3 12/02/2019 2011   MCHC 33.7 05/18/2021 1145   MCHC 32.3 12/02/2019 2011   RDW 13.2 05/18/2021 1145   LYMPHSABS 3.1 05/18/2021 1145   MONOABS 0.8 10/18/2018 1953   EOSABS 0.3 05/18/2021 1145   BASOSABS 0.1 05/18/2021 1145    Lab Results  Component Value Date   HGBA1C 5.7 06/26/2022    Assessment & Plan:  1. Type 2 diabetes mellitus with other specified complication, without  long-term current use of insulin (HCC) Controlled with A1c of 5.7 Due to intolerance of metformin we will discontinue metformin and work on diet control Counseled on Diabetic diet, my plate method, 161 minutes of moderate intensity exercise/week Blood sugar logs with fasting goals of 80-120 mg/dl, random of less than 096 and in the event of sugars less than 60 mg/dl or greater than 045 mg/dl encouraged to notify the clinic. Advised on the need for annual eye exams, annual foot exams, Pneumonia vaccine. - Microalbumin / creatinine urine ratio; Future - LP+Non-HDL Cholesterol; Future - CMP14+EGFR; Future - POCT glycosylated hemoglobin (Hb A1C)  2. Lipoma of other specified sites Per patient this is increasing in size - Ambulatory referral to General Surgery  3. Hypertension associated with diabetes (HCC) Controlled Counseled on blood pressure goal of less than 130/80, low-sodium, DASH diet, medication compliance, 150 minutes of moderate intensity exercise per week. Discussed medication compliance, adverse effects. - amLODipine (NORVASC) 10 MG tablet; Take 1 tablet (10 mg total) by mouth daily.  Dispense: 90 tablet; Refill: 1 - hydrochlorothiazide (HYDRODIURIL) 25 MG tablet; Take 1 tablet (25 mg total) by mouth daily.  Dispense: 90 tablet; Refill: 1  4. Hyperlipidemia associated with type 2 diabetes mellitus (HCC) Controlled Will send of lipid panel Low-cholesterol diet - atorvastatin (LIPITOR) 40 MG tablet; Take 1 tablet (40 mg total) by mouth daily.  Dispense: 90 tablet; Refill: 1  5. Seasonal allergic rhinitis due to other allergic trigger Stable - cetirizine (ZYRTEC) 10 MG tablet; Take 1 tablet (10 mg total) by mouth daily.  Dispense: 90 tablet; Refill: 1  6. Anxiety and depression He has been out of Prozac which I have refilled - FLUoxetine (PROZAC) 20 MG tablet; Take 1 tablet (20 mg total) by mouth daily.  Dispense: 90 tablet; Refill: 0  7. Muscle spasm Stable - meloxicam  (MOBIC) 15 MG tablet; Take 1 tablet (15 mg total) by mouth daily.  Dispense: 90 tablet; Refill: 1  8. Primary osteoarthritis involving multiple joints Uncontrolled He does have arthritis in his knees and his hands Status post cortisone injections with no relief He has been out of meloxicam which I have refilled Advised on the role of exercise arthritis Will refer for aquatic therapy - Ambulatory referral to Physical Therapy    Meds ordered this encounter  Medications  amLODipine (NORVASC) 10 MG tablet    Sig: Take 1 tablet (10 mg total) by mouth daily.    Dispense:  90 tablet    Refill:  1    Dose increase   atorvastatin (LIPITOR) 40 MG tablet    Sig: Take 1 tablet (40 mg total) by mouth daily.    Dispense:  90 tablet    Refill:  1   cetirizine (ZYRTEC) 10 MG tablet    Sig: Take 1 tablet (10 mg total) by mouth daily.    Dispense:  90 tablet    Refill:  1   FLUoxetine (PROZAC) 20 MG tablet    Sig: Take 1 tablet (20 mg total) by mouth daily.    Dispense:  90 tablet    Refill:  0   hydrochlorothiazide (HYDRODIURIL) 25 MG tablet    Sig: Take 1 tablet (25 mg total) by mouth daily.    Dispense:  90 tablet    Refill:  1   meloxicam (MOBIC) 15 MG tablet    Sig: Take 1 tablet (15 mg total) by mouth daily.    Dispense:  90 tablet    Refill:  1    Follow-up: Return in about 6 months (around 12/27/2022) for Chronic medical conditions.       Hoy Register, MD, FAAFP. [facility] [place], [place] [phone]   06/26/2022, 4:49 PM

## 2022-06-26 NOTE — Patient Instructions (Signed)

## 2022-06-27 ENCOUNTER — Other Ambulatory Visit: Payer: Self-pay | Admitting: Family Medicine

## 2022-06-27 DIAGNOSIS — E785 Hyperlipidemia, unspecified: Secondary | ICD-10-CM

## 2022-06-27 DIAGNOSIS — M62838 Other muscle spasm: Secondary | ICD-10-CM

## 2022-06-29 DIAGNOSIS — R222 Localized swelling, mass and lump, trunk: Secondary | ICD-10-CM | POA: Diagnosis not present

## 2022-07-13 ENCOUNTER — Encounter: Payer: Self-pay | Admitting: Family Medicine

## 2022-07-16 ENCOUNTER — Other Ambulatory Visit: Payer: Self-pay | Admitting: Family Medicine

## 2022-07-16 DIAGNOSIS — K219 Gastro-esophageal reflux disease without esophagitis: Secondary | ICD-10-CM

## 2022-07-16 MED ORDER — OMEPRAZOLE 40 MG PO CPDR: 40.0000 mg | DELAYED_RELEASE_CAPSULE | Freq: Every day | ORAL | 1 refills | Status: DC

## 2022-07-17 ENCOUNTER — Ambulatory Visit: Payer: BC Managed Care – PPO | Admitting: Family Medicine

## 2022-08-02 ENCOUNTER — Other Ambulatory Visit: Payer: Self-pay | Admitting: Family Medicine

## 2022-08-02 DIAGNOSIS — F32A Depression, unspecified: Secondary | ICD-10-CM

## 2022-08-02 NOTE — Telephone Encounter (Signed)
Requested Prescriptions  Pending Prescriptions Disp Refills   busPIRone (BUSPAR) 15 MG tablet [Pharmacy Med Name: busPIRone HCL 15 MG TABLET] 180 tablet 0    Sig: TAKE 1 TABLET BY MOUTH TWICE A DAY     Psychiatry: Anxiolytics/Hypnotics - Non-controlled Passed - 08/02/2022  9:12 AM      Passed - Valid encounter within last 12 months    Recent Outpatient Visits           1 month ago Type 2 diabetes mellitus with other specified complication, without long-term current use of insulin (HCC)   [place] [facility] [place], [place], MD   8 months ago Type 2 diabetes mellitus with other specified complication, without long-term current use of insulin (HCC)   [place] [facility] [place], [place], [place]   1 year ago Type 2 diabetes mellitus with other specified complication, without long-term current use of insulin (HCC)   [place] [facility] Hoy Register, MD   1 year ago Essential hypertension   [place] [facility] Hoy Register, MD   2 years ago Periumbilical abdominal pain    [facility] Hoy Register, MD       Future Appointments             In 5 months Hoy Register, MD [facility]

## 2022-08-17 ENCOUNTER — Ambulatory Visit (HOSPITAL_BASED_OUTPATIENT_CLINIC_OR_DEPARTMENT_OTHER): Payer: BC Managed Care – PPO | Attending: Family Medicine | Admitting: Physical Therapy

## 2022-09-01 ENCOUNTER — Encounter: Payer: Self-pay | Admitting: Family Medicine

## 2022-09-03 ENCOUNTER — Other Ambulatory Visit: Payer: Self-pay | Admitting: Family Medicine

## 2022-09-03 DIAGNOSIS — J3089 Other allergic rhinitis: Secondary | ICD-10-CM

## 2022-09-03 MED ORDER — FLUTICASONE PROPIONATE 50 MCG/ACT NA SUSP: 1.0000 | Freq: Every day | NASAL | 3 refills | Status: DC

## 2022-09-07 ENCOUNTER — Encounter: Payer: Self-pay | Admitting: Family Medicine

## 2022-09-10 ENCOUNTER — Other Ambulatory Visit: Payer: Self-pay | Admitting: Family Medicine

## 2022-09-10 DIAGNOSIS — F419 Anxiety disorder, unspecified: Secondary | ICD-10-CM

## 2022-09-11 NOTE — Telephone Encounter (Signed)
Requested Prescriptions  Pending Prescriptions Disp Refills   FLUoxetine (PROZAC) 20 MG tablet [Pharmacy Med Name: FLUoxetine HCL 20 MG TABLET] 90 tablet 0    Sig: TAKE 1 TABLET BY MOUTH DAILY     Psychiatry:  Antidepressants - SSRI Passed - 09/10/2022  6:24 AM      Passed - Valid encounter within last 6 months    Recent Outpatient Visits           2 months ago Type 2 diabetes mellitus with other specified complication, without long-term current use of insulin (HCC)   [place] [facility] [place], [place], MD   9 months ago Type 2 diabetes mellitus with other specified complication, without long-term current use of insulin (HCC)   [place] [facility] [place], [place], MD   1 year ago Type 2 diabetes mellitus with other specified complication, without long-term current use of insulin (HCC)   [place] [facility] Hoy Register, MD   1 year ago Essential hypertension   [place] [facility] Hoy Register, MD   2 years ago Periumbilical abdominal pain   [place] [facility] Hoy Register, MD       Future Appointments             In 3 months Hoy Register, MD [facility] [place] [facility]

## 2022-11-17 ENCOUNTER — Encounter: Payer: Self-pay | Admitting: Family Medicine

## 2022-11-19 ENCOUNTER — Other Ambulatory Visit: Payer: Self-pay | Admitting: Family Medicine

## 2022-11-19 DIAGNOSIS — F32A Depression, unspecified: Secondary | ICD-10-CM

## 2022-11-19 DIAGNOSIS — F419 Anxiety disorder, unspecified: Secondary | ICD-10-CM

## 2022-11-19 DIAGNOSIS — E1169 Type 2 diabetes mellitus with other specified complication: Secondary | ICD-10-CM

## 2022-11-19 DIAGNOSIS — E785 Hyperlipidemia, unspecified: Secondary | ICD-10-CM

## 2022-11-19 DIAGNOSIS — I152 Hypertension secondary to endocrine disorders: Secondary | ICD-10-CM

## 2022-11-19 DIAGNOSIS — K219 Gastro-esophageal reflux disease without esophagitis: Secondary | ICD-10-CM

## 2022-11-19 MED ORDER — ATORVASTATIN CALCIUM 40 MG PO TABS: 40.0000 mg | ORAL_TABLET | Freq: Every day | ORAL | 1 refills | Status: DC

## 2022-11-19 MED ORDER — HYDROCHLOROTHIAZIDE 25 MG PO TABS: 25.0000 mg | ORAL_TABLET | Freq: Every day | ORAL | 1 refills | Status: DC

## 2022-11-19 MED ORDER — OMEPRAZOLE 40 MG PO CPDR
40.0000 mg | DELAYED_RELEASE_CAPSULE | Freq: Every day | ORAL | 1 refills | Status: DC
Start: 2022-11-19 — End: 2022-12-31

## 2022-11-19 MED ORDER — BUSPIRONE HCL 15 MG PO TABS: 15.0000 mg | ORAL_TABLET | Freq: Two times a day (BID) | ORAL | 1 refills | Status: DC

## 2022-11-19 MED ORDER — AMLODIPINE BESYLATE 10 MG PO TABS: 10.0000 mg | ORAL_TABLET | Freq: Every day | ORAL | 1 refills | Status: DC

## 2022-12-20 ENCOUNTER — Other Ambulatory Visit: Payer: Self-pay | Admitting: Family Medicine

## 2022-12-20 DIAGNOSIS — J3089 Other allergic rhinitis: Secondary | ICD-10-CM

## 2022-12-21 NOTE — Telephone Encounter (Signed)
Requested medications are due for refill today.  yes  Requested medications are on the active medications list.  yes  Last refill. 06/26/2022 #90 1 rf  Future visit scheduled.   yes  Notes to clinic.  Expired labs.    Requested Prescriptions  Pending Prescriptions Disp Refills   cetirizine (ZYRTEC) 10 MG tablet [Pharmacy Med Name: CETIRIZINE 10MG  TABLETS] 90 tablet 1    Sig: TAKE 1 TABLET(10 MG) BY MOUTH DAILY     Ear, Nose, and Throat:  Antihistamines 2 Failed - 12/20/2022  4:41 PM      Failed - Cr in normal range and within 360 days    Creat  Date Value Ref Range Status  03/14/2016 1.36 (H) 0.60 - 1.35 mg/dL Final   Creatinine, Ser  Date Value Ref Range Status  11/23/2021 1.04 0.76 - 1.27 mg/dL Final         Passed - Valid encounter within last 12 months    Recent Outpatient Visits           5 months ago Type 2 diabetes mellitus with other specified complication, without long-term current use of insulin (HCC)   [place] [facility] [place] Hoy Register, MD   1 year ago Type 2 diabetes mellitus with other specified complication, without long-term current use of insulin (HCC)   [place] [facility] [place] Hoy Register, MD   1 year ago Type 2 diabetes mellitus with other specified complication, without long-term current use of insulin (HCC)   [place] [facility] [place] [facility] [place]. [facility] Hoy Register, MD   1 year ago Essential hypertension   [place] [facility] [place] [facility] [place]. [facility] Hoy Register, MD   2 years ago Periumbilical abdominal pain   [place] [facility] [place] [facility] [place]. [facility] Hoy Register, MD       Future Appointments             In 1 week Hoy Register, MD [place] [facility] [place]. [facility]

## 2022-12-31 ENCOUNTER — Ambulatory Visit: Payer: BC Managed Care – PPO | Attending: Family Medicine | Admitting: Family Medicine

## 2022-12-31 ENCOUNTER — Encounter: Payer: Self-pay | Admitting: Family Medicine

## 2022-12-31 VITALS — BP 123/87 | HR 98 | Ht 67.0 in | Wt 188.6 lb

## 2022-12-31 DIAGNOSIS — M62838 Other muscle spasm: Secondary | ICD-10-CM

## 2022-12-31 DIAGNOSIS — E1169 Type 2 diabetes mellitus with other specified complication: Secondary | ICD-10-CM

## 2022-12-31 DIAGNOSIS — I152 Hypertension secondary to endocrine disorders: Secondary | ICD-10-CM

## 2022-12-31 DIAGNOSIS — F32A Depression, unspecified: Secondary | ICD-10-CM

## 2022-12-31 DIAGNOSIS — G8929 Other chronic pain: Secondary | ICD-10-CM

## 2022-12-31 DIAGNOSIS — M25562 Pain in left knee: Secondary | ICD-10-CM

## 2022-12-31 DIAGNOSIS — M5442 Lumbago with sciatica, left side: Secondary | ICD-10-CM

## 2022-12-31 DIAGNOSIS — M25561 Pain in right knee: Secondary | ICD-10-CM

## 2022-12-31 DIAGNOSIS — K219 Gastro-esophageal reflux disease without esophagitis: Secondary | ICD-10-CM

## 2022-12-31 DIAGNOSIS — F419 Anxiety disorder, unspecified: Secondary | ICD-10-CM

## 2022-12-31 DIAGNOSIS — M25512 Pain in left shoulder: Secondary | ICD-10-CM

## 2022-12-31 DIAGNOSIS — F17210 Nicotine dependence, cigarettes, uncomplicated: Secondary | ICD-10-CM

## 2022-12-31 LAB — POCT GLYCOSYLATED HEMOGLOBIN (HGB A1C): HbA1c, POC (controlled diabetic range): 5.9 % (ref 0.0–7.0)

## 2022-12-31 MED ORDER — DULOXETINE HCL 60 MG PO CPEP: 60.0000 mg | ORAL_CAPSULE | Freq: Every day | ORAL | 3 refills | Status: DC

## 2022-12-31 MED ORDER — MELOXICAM 15 MG PO TABS
15.0000 mg | ORAL_TABLET | Freq: Every day | ORAL | 1 refills | Status: DC
Start: 2022-12-31 — End: 2023-05-20

## 2022-12-31 MED ORDER — OMEPRAZOLE 40 MG PO CPDR
40.0000 mg | DELAYED_RELEASE_CAPSULE | Freq: Every day | ORAL | 1 refills | Status: DC
Start: 2022-12-31 — End: 2023-07-05

## 2022-12-31 MED ORDER — GABAPENTIN 300 MG PO CAPS
300.0000 mg | ORAL_CAPSULE | Freq: Two times a day (BID) | ORAL | 3 refills | Status: DC
Start: 2022-12-31 — End: 2023-05-20

## 2022-12-31 MED ORDER — METHOCARBAMOL 500 MG PO TABS: 500.0000 mg | ORAL_TABLET | Freq: Three times a day (TID) | ORAL | 1 refills | Status: DC | PRN

## 2022-12-31 NOTE — Patient Instructions (Signed)
VISIT SUMMARY:  During today's visit, we addressed your joint pain, lower back pain, and mental health concerns. We discussed your shoulder, knee, and back pain, as well as your current treatment for depression and anxiety. We have made some changes to your medication and recommended further evaluations and therapies to help manage your symptoms.  YOUR PLAN:  -LEFT SHOULDER PAIN: Left shoulder pain can be caused by various factors, including overuse, injury, or degenerative changes. We will order an X-ray to get a better look at the shoulder and refer you to physical therapy to help improve your range of motion and reduce pain.  -KNEE PAIN: Knee pain can result from injuries, overuse, or conditions like arthritis. Despite previous treatments, your pain persists, so we will refer you to an orthopedic specialist for further evaluation and possibly repeat injections.  -LOWER BACK PAIN: Lower back pain, sometimes accompanied by sciatica, can be due to muscle strain, disc issues, or other spinal problems. We will refer you to physical therapy to help alleviate your symptoms and improve your mobility.  -DEPRESSION AND ANXIETY: Depression and anxiety are mental health conditions that can affect your mood and overall well-being. Since your current medications are not effectively managing your symptoms, we will discontinue Prozac and start you on Cymbalta, which can help with both mood and pain management.  -DIABETES: This is controlled on diet.  -HYPERTENSION: This is controlled.  Continue current medication.  -MEDICATION MANAGEMENT: We will continue your current medications for pain management, including Meloxicam, Gabapentin, and Methocarbamol. We will add Cymbalta to help with both your mood and pain symptoms.  -GENERAL HEALTH MAINTENANCE: We will order fasting blood work to check your cholesterol levels and refer you for an eye exam with an ophthalmologist to ensure your overall health is  monitored.  INSTRUCTIONS:  Please follow up with the recommended X-ray for your left shoulder and attend physical therapy sessions for both your shoulder and lower back pain. Schedule an appointment with an orthopedic specialist for your knee pain evaluation and possible injections. Begin taking Cymbalta as prescribed and discontinue Prozac. Continue with your current pain medications. Complete the fasting blood work on 01/04/2023 and schedule an eye exam with an ophthalmologist.

## 2022-12-31 NOTE — Progress Notes (Unsigned)
Subjective:  Patient ID: Timothy Mccarthy, male    DOB: [birth year]  Age: 52 y.o. MRN: [id number]  CC: Medical Management of Chronic Issues (Joint pain)   HPI MILLAGE PACE is a 52 y.o. year old male with a history of  hypertension, GERD, anxiety and depression, Osteoarthritis of the knee , laparoscopic umbilical hernia repair at [facility], type 2 diabetes mellitus (A1c 5.7).  Interval History: Discussed the use of AI scribe software for clinical note transcription with the patient, who gave verbal consent to proceed.  History of Present Illness   The patient, with a history of diabetes, presents with multiple complaints. The chief complaint is joint pain, which is affecting the patient's knees, shoulders, and back. The pain in the left shoulder is particularly bothersome after work, despite the patient's recent change to a less physically demanding job. The patient reports a cracking sensation in the left shoulder and decreased range of motion, which is particularly noticeable when lifting objects. The patient also reports knee pain, which has been previously evaluated by an orthopedic specialist. Despite receiving knee injections in the past, the patient continues to experience discomfort.  In addition to joint pain, the patient is experiencing lower back pain, which sometimes causes a sensation of leaning to one side. The patient also reports a history of sciatica in the left leg.  The patient's mental health is also a concern. Despite being on Prozac and Buspar for depression and anxiety, the patient reports that his symptoms are worsening.  For his diabetes he is on diet control and he remains on an antihypertensive.   Past Medical History:  Diagnosis Date   Adjustment disorder with anxious mood    Allergy    Anxiety    Arthritis    "hands" (09/28/2015)   Depression    GERD (gastroesophageal reflux disease)    Hyperlipidemia    Hypertension    Sleep apnea    age 60    Tobacco use  disorder     Past Surgical History:  Procedure Laterality Date   APPENDECTOMY  09/15/2015   CARPAL TUNNEL RELEASE Bilateral    INCISIONAL HERNIA REPAIR N/A 09/29/2015   Procedure: HERNIA REPAIR INCISIONAL;  Surgeon: Harriette Bouillon, MD;  Location: [facility] OR;  Service: General;  Laterality: N/A;   LAPAROSCOPIC APPENDECTOMY N/A 09/15/2015   Procedure: APPENDECTOMY LAPAROSCOPIC;  Surgeon: Jimmye Norman, MD;  Location: [facility] OR;  Service: General;  Laterality: N/A;   LUMPS ON BACK  2013   WISDOM TOOTH EXTRACTION      Family History  Problem Relation Age of Onset   Stroke Mother    Diabetes type II Mother    Diabetes Mother    Colon polyps Neg Hx    Esophageal cancer Neg Hx    Rectal cancer Neg Hx    Stomach cancer Neg Hx     Social History   Socioeconomic History   Marital status: Significant Other    Spouse name: Not on file   Number of children: Not on file   Years of education: Not on file   Highest education level: Not on file  Occupational History   Not on file  Tobacco Use   Smoking status: Every Day    Current packs/day: 0.50    Average packs/day: 0.5 packs/day for 35.7 years (17.8 ttl pk-yrs)    Types: Cigarettes    Start date: 04/21/1987   Smokeless tobacco: Never   Tobacco comments:    Patches   Vaping Use  Vaping status: Never Used  Substance and Sexual Activity   Alcohol use: Yes    Alcohol/week: 4.0 standard drinks of alcohol    Types: 2 Cans of beer, 2 Shots of liquor per week    Comment: "takes shots whenever has pain"   Drug use: No   Sexual activity: Yes  Other Topics Concern   Not on file  Social History Narrative   He lives with his wife in [place] and 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   Social Determinants of Health   Financial Resource Strain: Low Risk  (12/31/2022)   Overall Financial Resource Strain (CARDIA)    Difficulty of Paying Living Expenses: Not very hard  Food Insecurity: Food Insecurity Present (12/31/2022)    Hunger Vital Sign    Worried About Running Out of Food in the Last Year: Sometimes true    Ran Out of Food in the Last Year: Sometimes true  Transportation Needs: No Transportation Needs (12/31/2022)   PRAPARE - Administrator, Civil Service (Medical): No    Lack of Transportation (Non-Medical): No  Physical Activity: Insufficiently Active (12/31/2022)   Exercise Vital Sign    Days of Exercise per Week: 4 days    Minutes of Exercise per Session: 30 min  Stress: Stress Concern Present (12/31/2022)   [employer] of Occupational Health - Occupational Stress Questionnaire    Feeling of Stress : To some extent  Social Connections: Moderately Integrated (12/31/2022)   Social Connection and Isolation Panel [NHANES]    Frequency of Communication with Friends and Family: Twice a week    Frequency of Social Gatherings with Friends and Family: Twice a week    Attends Religious Services: More than 4 times per year    Active Member of [employer] or Organizations: No    Attends Engineer, structural: Never    Marital Status: Married    Allergies  Allergen Reactions   Penicillins Anaphylaxis    Has patient had a PCN reaction causing immediate rash, facial/tongue/throat swelling, SOB or lightheadedness with hypotensionNO Has patient had a PCN reaction causing severe rash involving mucus membranes or skin necrosis: NO Has patient had a PCN reaction that required hospitalization NO Has patient had a PCN reaction occurring within the last 10 years: NO If all of the above answers are "NO", then may proceed with Cephalosporin use.    Outpatient Medications Prior to Visit  Medication Sig Dispense Refill   amLODipine (NORVASC) 10 MG tablet Take 1 tablet (10 mg total) by mouth daily. 90 tablet 1   atorvastatin (LIPITOR) 40 MG tablet Take 1 tablet (40 mg total) by mouth daily. 90 tablet 1   busPIRone (BUSPAR) 15 MG tablet Take 1 tablet (15 mg total) by mouth 2 (two) times daily. 180  tablet 1   cetirizine (ZYRTEC) 10 MG tablet TAKE 1 TABLET(10 MG) BY MOUTH DAILY 30 tablet 0   chlorpheniramine-HYDROcodone (TUSSIONEX) 10-8 MG/5ML Take 5 mLs by mouth every 12 (twelve) hours as needed for cough. 120 mL 0   fluticasone (FLONASE) 50 MCG/ACT nasal spray Place 1 spray into both nostrils daily. 16 mL 3   hydrochlorothiazide (HYDRODIURIL) 25 MG tablet Take 1 tablet (25 mg total) by mouth daily. 90 tablet 1   Misc. Devices MISC CPAP machine on autopap 5 to 15 1 each 0   Multiple Vitamin (MULTIVITAMIN WITH MINERALS) TABS tablet Take 2 tablets by mouth daily.      Omega-3 Fatty Acids (FISH OIL OMEGA-3  PO) Take by mouth daily.     traMADol (ULTRAM) 50 MG tablet TAKE ONE TABLET BY MOUTH AT BEDTIME AS NEEDED 30 tablet 1   zinc sulfate 220 (50 Zn) MG capsule Take 220 mg by mouth daily.     FLUoxetine (PROZAC) 20 MG tablet TAKE 1 TABLET BY MOUTH DAILY 90 tablet 0   gabapentin (NEURONTIN) 300 MG capsule TAKE 1 CAPSULE BY MOUTH TWICE A DAY 60 capsule 3   meloxicam (MOBIC) 15 MG tablet Take 1 tablet (15 mg total) by mouth daily. 90 tablet 1   methocarbamol (ROBAXIN) 500 MG tablet Take 1 tablet (500 mg total) by mouth every 8 (eight) hours as needed for muscle spasms. 270 tablet 1   omeprazole (PRILOSEC) 40 MG capsule Take 1 capsule (40 mg total) by mouth daily. 90 capsule 1   benzonatate (TESSALON) 100 MG capsule Take 1-2 capsules (100-200 mg total) by mouth 3 (three) times daily as needed. (Patient not taking: Reported on 12/31/2022) 60 capsule 0   clindamycin (CLEOCIN) 300 MG capsule Take 1 capsule (300 mg total) by mouth 3 (three) times daily. (Patient not taking: Reported on 12/31/2022) 30 capsule 0   nicotine (NICODERM CQ) 14 mg/24hr patch Place 1 patch (14 mg total) onto the skin daily. Then decrease to 7 mg / 24 hours (Patient not taking: Reported on 12/31/2022) 28 patch 2   loperamide (IMODIUM) 2 MG capsule Take 1 capsule (2 mg total) by mouth 2 (two) times daily as needed for diarrhea or  loose stools. (Patient not taking: Reported on 12/31/2022) 14 capsule 0   ondansetron (ZOFRAN-ODT) 8 MG disintegrating tablet DISSOLVE ONE TABLET BY MOUTH EVERY 8 HOURS AS NEEDED FOR NAUSEA OR VOMITING (Patient not taking: Reported on 12/31/2022) 20 tablet 0   predniSONE (DELTASONE) 20 MG tablet Take 1 tablet (20 mg total) by mouth daily with breakfast. (Patient not taking: Reported on 12/31/2022) 5 tablet 0   promethazine (PHENERGAN) 25 MG tablet Take 1 tablet (25 mg total) by mouth every 8 (eight) hours as needed for nausea or vomiting. (Patient not taking: Reported on 12/31/2022) 20 tablet 0   No facility-administered medications prior to visit.     ROS Review of Systems  Constitutional:  Negative for activity change and appetite change.  HENT:  Negative for sinus pressure and sore throat.   Respiratory:  Negative for chest tightness, shortness of breath and wheezing.   Cardiovascular:  Negative for chest pain and palpitations.  Gastrointestinal:  Negative for abdominal distention, abdominal pain and constipation.  Genitourinary: Negative.   Musculoskeletal:        See HPI  Psychiatric/Behavioral:  Negative for behavioral problems and dysphoric mood.     Objective:  BP 123/87   Pulse 98   Ht 5\' 7"  (1.702 m)   Wt 188 lb 9.6 oz (85.5 kg)   SpO2 97%   BMI 29.54 kg/m      12/31/2022    4:05 PM 06/26/2022    3:59 PM 11/23/2021   11:24 AM  BP/Weight  Systolic BP 123 129 139  Diastolic BP 87 85 94  Wt. (Lbs) 188.6 191.6 195  BMI 29.54 kg/m2 30.01 kg/m2 30.54 kg/m2      Physical Exam Constitutional:      Appearance: He is well-developed.  Cardiovascular:     Rate and Rhythm: Normal rate.     Heart sounds: Normal heart sounds. No murmur heard. Pulmonary:     Effort: Pulmonary effort is normal.     Breath  sounds: Normal breath sounds. No wheezing or rales.  Chest:     Chest wall: No tenderness.  Abdominal:     General: Bowel sounds are normal. There is no distension.      Palpations: Abdomen is soft. There is no mass.     Tenderness: There is no abdominal tenderness.  Musculoskeletal:     Right shoulder: No tenderness. Normal range of motion.     Left shoulder: Tenderness (anterior shoulder joint) present. Normal range of motion.     Lumbar back: Tenderness present. Positive left straight leg raise test.     Right knee: No swelling. Tenderness present.     Left knee: No swelling. Tenderness present.     Right lower leg: No edema.     Left lower leg: No edema.  Neurological:     Mental Status: He is alert and oriented to person, place, and time.  Psychiatric:        Mood and Affect: Mood normal.        Latest Ref Rng & Units 11/23/2021   12:08 PM 05/18/2021   11:45 AM 06/01/2020    4:02 PM  CMP  Glucose 70 - 99 mg/dL 95  90  90   BUN 6 - 24 mg/dL 12  11  10    Creatinine 0.76 - 1.27 mg/dL 8.11  9.14  7.82   Sodium 134 - 144 mmol/L 140  143  142   Potassium 3.5 - 5.2 mmol/L 3.8  3.7  3.8   Chloride 96 - 106 mmol/L 98  100  101   CO2 20 - 29 mmol/L 23  25  25    Calcium 8.7 - 10.2 mg/dL 95.6  9.7  9.4   Total Protein 6.0 - 8.5 g/dL 7.3  6.9  6.7   Total Bilirubin 0.0 - 1.2 mg/dL 0.4  0.4  <2.1   Alkaline Phos 44 - 121 IU/L 109  113  105   AST 0 - 40 IU/L 26  30  28    ALT 0 - 44 IU/L 27  37  39     Lipid Panel     Component Value Date/Time   CHOL 107 05/18/2021 1145   TRIG 215 (H) 05/18/2021 1145   HDL 35 (L) 05/18/2021 1145   CHOLHDL 5.1 (H) 07/23/2019 0950   LDLCALC 38 05/18/2021 1145    CBC    Component Value Date/Time   WBC 10.6 05/18/2021 1145   WBC 15.4 (H) 12/02/2019 2011   RBC 5.31 05/18/2021 1145   RBC 5.27 12/02/2019 2011   HGB 14.7 05/18/2021 1145   HCT 43.6 05/18/2021 1145   PLT 272 05/18/2021 1145   MCV 82 05/18/2021 1145   MCH 27.7 05/18/2021 1145   MCH 27.3 12/02/2019 2011   MCHC 33.7 05/18/2021 1145   MCHC 32.3 12/02/2019 2011   RDW 13.2 05/18/2021 1145   LYMPHSABS 3.1 05/18/2021 1145   MONOABS 0.8  10/18/2018 1953   EOSABS 0.3 05/18/2021 1145   BASOSABS 0.1 05/18/2021 1145    Lab Results  Component Value Date   HGBA1C 5.9 12/31/2022    Assessment & Plan:      Left Shoulder Pain Possibly AC osteoarthritis Pain exacerbated by movement and work, with popping sensation and stiffness. No numbness or tingling. Pain severe when present. -Order X-ray of left shoulder. -Refer to physical therapy.  Knee Pain Previous X-ray showed no significant degenerative joint changes. Pain persists despite Meloxicam use. Previous knee injections in [place]. -Refer to orthopedic  for evaluation and possible repeat injections.  Lower Back Pain Pain with radiation down left leg (sciatica). Patient reports feeling like he is leaning to one side. -Refer to physical therapy. Patient currently on Meloxicam, Gabapentin, and Methocarbamol for pain management. Difficulty with morning Gabapentin dose due to drowsiness.  Depression and Anxiety Patient reports worsening symptoms despite current treatment with Buspar and Prozac. -Discontinue Prozac. -Start Cymbalta to address both depression and pain symptoms.  Type 2 diabetes mellitus -Diet control -Counseled on Diabetic diet, my plate method, 161 minutes of moderate intensity exercise/week Blood sugar logs with fasting goals of 80-120 mg/dl, random of less than 096 and in the event of sugars less than 60 mg/dl or greater than 045 mg/dl encouraged to notify the clinic. Advised on the need for annual eye exams, annual foot exams, Pneumonia vaccine.   General Health Maintenance -Order fasting blood work for cholesterol check on 01/04/2023. -Refer for eye exam with ophthalmologist.          Meds ordered this encounter  Medications   DULoxetine (CYMBALTA) 60 MG capsule    Sig: Take 1 capsule (60 mg total) by mouth daily.    Dispense:  30 capsule    Refill:  3    Discontinue Prozac   gabapentin (NEURONTIN) 300 MG capsule    Sig: Take 1  capsule (300 mg total) by mouth 2 (two) times daily.    Dispense:  60 capsule    Refill:  3   meloxicam (MOBIC) 15 MG tablet    Sig: Take 1 tablet (15 mg total) by mouth daily.    Dispense:  90 tablet    Refill:  1   methocarbamol (ROBAXIN) 500 MG tablet    Sig: Take 1 tablet (500 mg total) by mouth every 8 (eight) hours as needed for muscle spasms.    Dispense:  270 tablet    Refill:  1   omeprazole (PRILOSEC) 40 MG capsule    Sig: Take 1 capsule (40 mg total) by mouth daily.    Dispense:  90 capsule    Refill:  1    Follow-up: Return in about 6 months (around 06/30/2023) for Chronic medical conditions.       Hoy Register, MD, FAAFP. [facility] [place], [place] [phone]   01/01/2023, 12:55 PM

## 2023-01-01 ENCOUNTER — Encounter: Payer: Self-pay | Admitting: Family Medicine

## 2023-01-04 ENCOUNTER — Ambulatory Visit: Payer: Self-pay | Attending: Family Medicine

## 2023-01-04 DIAGNOSIS — E1169 Type 2 diabetes mellitus with other specified complication: Secondary | ICD-10-CM

## 2023-01-04 DIAGNOSIS — G8929 Other chronic pain: Secondary | ICD-10-CM

## 2023-01-07 ENCOUNTER — Other Ambulatory Visit: Payer: Self-pay | Admitting: Family Medicine

## 2023-01-07 ENCOUNTER — Ambulatory Visit: Payer: Self-pay | Admitting: Orthopedic Surgery

## 2023-01-07 LAB — LP+NON-HDL CHOLESTEROL
Cholesterol, Total: 167 mg/dL (ref 100–199)
HDL: 35 mg/dL — ABNORMAL LOW (ref >39)
LDL Chol Calc (NIH): 54 mg/dL (ref 0–99)
Total Non-HDL-Chol (LDL+VLDL): 132 mg/dL — ABNORMAL HIGH (ref 0–129)
Triglycerides: 530 mg/dL — ABNORMAL HIGH (ref 0–149)
VLDL Cholesterol Cal: 78 mg/dL — ABNORMAL HIGH (ref 5–40)

## 2023-01-07 LAB — CMP14+EGFR
ALT: 25 [IU]/L (ref 0–44)
AST: 26 [IU]/L (ref 0–40)
Albumin: 4.4 g/dL (ref 3.8–4.9)
Alkaline Phosphatase: 100 IU/L (ref 44–121)
BUN/Creatinine Ratio: 13 (ref 9–20)
BUN: 13 mg/dL (ref 6–24)
Bilirubin Total: 0.3 mg/dL (ref 0.0–1.2)
CO2: 24 mmol/L (ref 20–29)
Calcium: 9.2 mg/dL (ref 8.7–10.2)
Chloride: 99 mmol/L (ref 96–106)
Creatinine, Ser: 0.97 mg/dL (ref 0.76–1.27)
Globulin, Total: 2.4 g/dL (ref 1.5–4.5)
Glucose: 85 mg/dL (ref 70–99)
Potassium: 3.5 mmol/L (ref 3.5–5.2)
Sodium: 138 mmol/L (ref 134–144)
Total Protein: 6.8 g/dL (ref 6.0–8.5)
eGFR: 95 mL/min/{1.73_m2} (ref >59)

## 2023-01-07 LAB — MICROALBUMIN / CREATININE URINE RATIO
Creatinine, Urine: 208.3 mg/dL
Microalb/Creat Ratio: 4 mg/g{creat} (ref 0–29)
Microalbumin, Urine: 7.3 ug/mL

## 2023-01-07 MED ORDER — OMEGA-3-ACID ETHYL ESTERS 1 G PO CAPS: 2.0000 g | ORAL_CAPSULE | Freq: Two times a day (BID) | ORAL | 1 refills | Status: DC

## 2023-01-22 NOTE — Therapy (Signed)
OUTPATIENT PHYSICAL THERAPY THORACOLUMBAR EVALUATION   Patient Name: Timothy Mccarthy MRN: [id number] DOB:1970-12-09, 52 y.o., male Today's Date: 01/23/2023  END OF SESSION:  PT End of Session - 01/23/23 1306     Visit Number 1    Number of Visits 17    Date for PT Re-Evaluation 03/20/23    Authorization Type --    PT Start Time 1307    PT Stop Time 1350    PT Time Calculation (min) 43 min    Activity Tolerance Patient tolerated treatment well;No increased pain    Behavior During Therapy WFL for tasks assessed/performed             Past Medical History:  Diagnosis Date   Adjustment disorder with anxious mood    Allergy    Anxiety    Arthritis    "hands" (09/28/2015)   Depression    GERD (gastroesophageal reflux disease)    Hyperlipidemia    Hypertension    Sleep apnea    age 68    Tobacco use disorder    Past Surgical History:  Procedure Laterality Date   APPENDECTOMY  09/15/2015   CARPAL TUNNEL RELEASE Bilateral    INCISIONAL HERNIA REPAIR N/A 09/29/2015   Procedure: HERNIA REPAIR INCISIONAL;  Surgeon: Harriette Bouillon, MD;  Location: [facility] OR;  Service: General;  Laterality: N/A;   LAPAROSCOPIC APPENDECTOMY N/A 09/15/2015   Procedure: APPENDECTOMY LAPAROSCOPIC;  Surgeon: Jimmye Norman, MD;  Location: [facility] OR;  Service: General;  Laterality: N/A;   LUMPS ON BACK  2013   WISDOM TOOTH EXTRACTION     Patient Active Problem List   Diagnosis Date Noted   Diabetes mellitus (HCC) 06/26/2022   Chronic pain of both knees 11/26/2017   Hyperlipidemia 12/25/2016   Anxiety 06/06/2016   Essential hypertension 11/04/2015   Abdominal hernia 09/28/2015   Appendicitis 09/15/2015   Tobacco use disorder 04/21/2015   Adjustment disorder with anxious mood 04/21/2015   Health care maintenance 04/21/2015   GERD (gastroesophageal reflux disease) 04/21/2015    PCP: Hoy Register, MD  REFERRING PROVIDER: Hoy Register, MD  REFERRING DIAG: [id number] (ICD-10-CM) - Chronic  left-sided low back pain with left-sided sciatica M25.561,M25.562 (ICD-10-CM) - Arthralgia of both knees  Rationale for Evaluation and Treatment: Rehabilitation  THERAPY DIAG:  Chronic pain of both knees  Other low back pain  Muscle weakness (generalized)  ONSET DATE: several years for back and knees  SUBJECTIVE:  SUBJECTIVE STATEMENT: Pt endorses initial back injury when he was 20 and fell off a roof, has had issues since that slowly seem to be worsening as time goes on. He also states he was very active with sports when he was younger and has had a demanding career working with maintenance. He endorses gradual onset of knee pain for several years that also seem to worsening. He endorses difficulty sleeping due to pain (waking 4-5x/night on avg), difficulty with household tasks, self care, and work tasks. Occasionally has LLE referral when lying on L side or when walking for a long time. Does have some BIL foot numbness at times which he states has been ongoing for past couple of years. He does endorse getting PT for these issues in the past and got some relief, but had to stop due to finances. States symptoms feel the same in quality but are slowly worsening with time. He denies bowel/bladder issues, saddle anesthesia or sensory changes, fevers/chills.     PERTINENT HISTORY:  anxiety/depression, HTN, GERD  PAIN:  Are you having pain: 4/10 back, 0/10 knees  Location/description: L low back and hip, BIL knees Best-worst over past week: Back 4-10/10,  0-10/10 - aggravating factors: bending forward (particularly prolonged), transfers, bed transfers, lifting (case of water), ADLs - Easing factors: heat, hot showers/bath, brace for knees  PRECAUTIONS: none   WEIGHT BEARING RESTRICTIONS: No  FALLS:   Has patient fallen in last 6 months? No  LIVING ENVIRONMENT: Lives in apartment, reports lots of stair navigation for work. Lives w/ spouse on first floor. Spouse does most of housework per pt report Has a lot of grandchildren  OCCUPATION: Teaching laboratory technician, has to do a lot of stairs. Avoiding heavy lifting.   PLOF: Independent  PATIENT GOALS: wants to get his back better, be able to interact more with grandbabies  NEXT MD VISIT: ~5 months from eval   OBJECTIVE:  Note: Objective measures were completed at Evaluation unless otherwise noted.  DIAGNOSTIC FINDINGS:  L shoulder XR ordered, not yet taken per review of EPIC Had lumbar/sacral XR and BIL knee XR in 2023, refer to EPIC for details  PATIENT SURVEYS:  FOTO deferred on eval  SCREENING FOR RED FLAGS: Does report some chronic BIL foot numbness at times over past two years, no recent changes. No bowel/bladder changes, no fevers/chills, no saddle anesthesia or sensory issues endorsed.   COGNITION: Overall cognitive status: Within functional limits for tasks assessed     SENSATION/NEURO: Light touch testing confounded by BIL knee brace and boots No clonus either LE Negative hoffmann and tromner sign BIL No ataxia with gait   POSTURE: rounded shoulders, forward head  LUMBAR ROM:   AROM eval  Flexion Proximal shin *  Extension 100% *  Right lateral flexion   Left lateral flexion   Right rotation 25% s*  Left rotation 25% s*   (Blank rows = not tested) (Key: WFL = within functional limits not formally assessed, * = concordant pain, s = stiffness/stretching sensation, NT = not tested)   RANGE OF MOTION:     ROM Right eval Left eval  Shoulder flexion    Shoulder abduction    Functional ER combo    Functional IR combo    Knee extension Full but painful Full but painful  Knee flexion A: 110 deg * A: 112 deg *  Ankle dorsiflexion     (Blank rows = not tested) (Key: WFL = within functional limits not  formally assessed, * =  concordant pain, s = stiffness/stretching sensation, NT = not tested)  Comments:    STRENGTH TESTING:  MMT Right eval Left eval  Shoulder flexion    Shoulder abduction    Shoulder ER    Shoulder IR    Elbow flexion    Elbow extension    Grip strength (gross)    Hip flexion 4 * 4- *  Hip abduction (modified sitting) 4 4  Knee flexion 4 4- *  Knee extension 4+ 4+  Ankle dorsiflexion 5 5  Ankle plantarflexion     (Blank rows = not tested) (Key: WFL = within functional limits not formally assessed, * = concordant pain, s = stiffness/stretching sensation, NT = not tested)  Comments:    LUMBAR SPECIAL TESTS:  Slump test: R negative ; L negative  SLR confounded by knee pain with extension BIL  FUNCTIONAL TESTS:  5xSTS: 20.43sec w UE support from thighs, knee>back pain  GAIT: Distance walked: within clinic Assistive device utilized: None Level of assistance: Complete Independence Comments: reduced gait speed/cadence, reduced truncal rotation  TODAY'S TREATMENT:                                                                                                                              [facility] Adult PT Treatment:                                                DATE: 01/23/23 Therapeutic Exercise: Seated thoracolumbar extension x8 LAQ x8 BIL Seated march x8 BIL HEP education    PATIENT EDUCATION:  Education details: Pt education on PT impairments, prognosis, and POC. Informed consent. Rationale for interventions, safe/appropriate HEP performance Person educated: Patient Education method: Explanation, Demonstration, Tactile cues, Verbal cues, and Handouts Education comprehension: verbalized understanding, returned demonstration, verbal cues required, tactile cues required, and needs further education    HOME EXERCISE PROGRAM: Access Code: [email or web address] URL: [email or web address] Date: 01/23/2023 Prepared by: Fransisco Hertz  Exercises -  Seated Thoracic Lumbar Extension  - 2-3 x daily - 1 sets - 8 reps - Seated Long Arc Quad  - 2-3 x daily - 1 sets - 8 reps - Seated March  - 2-3 x daily - 1 sets - 8 reps  ASSESSMENT:  CLINICAL IMPRESSION: Patient is a pleasant 52 y.o. gentleman who was seen today for physical therapy evaluation and treatment for back and BIL knee pain ongoing for several years. Red flag questioning reassuring, most recent imaging XR in 2023 (refer to EPIC for details). Pt endorses difficulty w/ majority of daily/work tasks due to pain, most difficulty with prolonged standing/walking, lifting, and sleeping. On exam he demonstrates limitations in lumbar mobility w/ flexion being most provocative, and LE weakness that is more prominent on L (which he describes as most affected from both knee and back perspective). Slump testing unremarkable,  5xSTS is indicative of fall risk and reduced functional mobility. Does well with HEP, endorses improved symptoms afterwards. No adverse events. Recommend trial of skilled PT to address aforementioned deficits with aim of improving functional tolerance and reducing pain with typical activities. Pt departs today's session in no acute distress, all voiced concerns/questions addressed appropriately from PT perspective.      OBJECTIVE IMPAIRMENTS: Abnormal gait, decreased activity tolerance, decreased endurance, decreased mobility, difficulty walking, decreased ROM, decreased strength, hypomobility, improper body mechanics, postural dysfunction, and pain.   ACTIVITY LIMITATIONS: carrying, lifting, bending, standing, squatting, sleeping, stairs, transfers, bed mobility, and locomotion level  PARTICIPATION LIMITATIONS: meal prep, cleaning, laundry, community activity, and occupation  PERSONAL FACTORS: Time since onset of injury/illness/exacerbation and 1-2 comorbidities: anxiety/depression, HTN  are also affecting patient's functional outcome.   REHAB POTENTIAL: Good  CLINICAL DECISION  MAKING: Stable/uncomplicated  EVALUATION COMPLEXITY: Low   GOALS: Goals reviewed with patient? No - did discuss PT POC and role of PT  SHORT TERM GOALS: Target date: 02/20/2023 Pt will demonstrate appropriate understanding and performance of initially prescribed HEP in order to facilitate improved independence with management of symptoms.  Baseline: HEP provided on eval Goal status: INITIAL   2. Pt will score increase FOTO score by at least MCID in order to demonstrate improved perception of function due to symptoms.  Baseline: FOTO TBD  Goal status: INITIAL    LONG TERM GOALS: Target date: 03/20/2023 Pt will meet predicted score on FOTO in order to demonstrate improved perception of functional status due to symptoms.  Baseline: FOTO TBD Goal status: INITIAL  2.  Pt will demonstrate lumbar flexion AROM to at least distal shin in order to demonstrate improved tolerance to ADLS such as lower body dressing. Baseline: proximal shin with pain Goal status: INITIAL  3.  Pt will demonstrate hip/knee MMT of at least 4+/5 bilaterally in order to demonstrate improved strength for functional movements.  Baseline: see MMT chart above Goal status: INITIAL  4. Pt will perform 5xSTS in <12 sec in order to demonstrate reduced fall risk and improved functional independence. (MCID of 2.3sec)  Baseline: 20sec w/ UE support and knee/back pain  Goal status: INITIAL   5. Pt will report at least 50% decrease in overall pain levels in past week in order to facilitate improved tolerance to basic ADLs/mobility.   Baseline: 4-10/10 back, 0-10/10 knees  Goal status: INITIAL    6. Pt will endorse waking no more than 2x/night on average over past week due to knee/back pain in order to facilitate improved overall health and QOL.  Baseline: 4-5x/night on average per pt report  Goals status: INITIAL  PLAN:  PT FREQUENCY: 2x/week  PT DURATION: 8 weeks  PLANNED INTERVENTIONS: 97164- PT Re-evaluation,  97110-Therapeutic exercises, 97530- Therapeutic activity, 97112- Neuromuscular re-education, 97535- Self Care, 95284- Manual therapy, [phone]- Gait training, [phone]- Aquatic Therapy, Patient/Family education, Balance training, Stair training, Taping, Joint mobilization, Spinal mobilization, Cryotherapy, and Moist heat.  PLAN FOR NEXT SESSION: Review/update HEP PRN. Work on [employer] exercises as appropriate with emphasis on knee/hip strength, gentle lumbar mobility within pt tolerance. Symptom modification strategies as indicated/appropriate. FOTO as able - initial eval was erroneously set up for elbow.    Ashley Murrain PT, DPT 01/23/2023 4:45 PM

## 2023-01-23 ENCOUNTER — Encounter: Payer: Self-pay | Admitting: Physical Therapy

## 2023-01-23 ENCOUNTER — Ambulatory Visit: Payer: Self-pay | Attending: Family Medicine | Admitting: Physical Therapy

## 2023-01-23 DIAGNOSIS — G8929 Other chronic pain: Secondary | ICD-10-CM | POA: Insufficient documentation

## 2023-01-23 DIAGNOSIS — M25561 Pain in right knee: Secondary | ICD-10-CM | POA: Insufficient documentation

## 2023-01-23 DIAGNOSIS — M5459 Other low back pain: Secondary | ICD-10-CM | POA: Insufficient documentation

## 2023-01-23 DIAGNOSIS — M5442 Lumbago with sciatica, left side: Secondary | ICD-10-CM | POA: Insufficient documentation

## 2023-01-23 DIAGNOSIS — M25562 Pain in left knee: Secondary | ICD-10-CM | POA: Insufficient documentation

## 2023-01-23 DIAGNOSIS — M6281 Muscle weakness (generalized): Secondary | ICD-10-CM | POA: Insufficient documentation

## 2023-01-26 ENCOUNTER — Other Ambulatory Visit: Payer: Self-pay | Admitting: Family Medicine

## 2023-01-26 DIAGNOSIS — J3089 Other allergic rhinitis: Secondary | ICD-10-CM

## 2023-02-18 ENCOUNTER — Encounter: Payer: Self-pay | Admitting: Family Medicine

## 2023-02-19 ENCOUNTER — Encounter: Payer: Self-pay | Admitting: Physical Therapy

## 2023-02-19 ENCOUNTER — Ambulatory Visit: Payer: BC Managed Care – PPO | Attending: Family Medicine | Admitting: Physical Therapy

## 2023-02-19 DIAGNOSIS — M25561 Pain in right knee: Secondary | ICD-10-CM | POA: Insufficient documentation

## 2023-02-19 DIAGNOSIS — G8929 Other chronic pain: Secondary | ICD-10-CM | POA: Diagnosis not present

## 2023-02-19 DIAGNOSIS — M5459 Other low back pain: Secondary | ICD-10-CM | POA: Diagnosis not present

## 2023-02-19 DIAGNOSIS — M25562 Pain in left knee: Secondary | ICD-10-CM | POA: Insufficient documentation

## 2023-02-19 DIAGNOSIS — M6281 Muscle weakness (generalized): Secondary | ICD-10-CM | POA: Diagnosis not present

## 2023-02-19 NOTE — Therapy (Signed)
 OUTPATIENT PHYSICAL THERAPY THORACOLUMBAR TREATMENT    Patient Name: Timothy Mccarthy MRN: [id number] DOB:November 10, 1970, 53 y.o., male Today's Date: 02/19/2023  END OF SESSION:  PT End of Session - 02/19/23 1324     Visit Number 2    Number of Visits 17    Date for PT Re-Evaluation 03/20/23    Authorization Type [employer]- just gave insurance card to front desk 02/19/23    PT Start Time 1318    PT Stop Time 1356    PT Time Calculation (min) 38 min             Past Medical History:  Diagnosis Date   Adjustment disorder with anxious mood    Allergy    Anxiety    Arthritis    hands (09/28/2015)   Depression    GERD (gastroesophageal reflux disease)    Hyperlipidemia    Hypertension    Sleep apnea    age 68    Tobacco use disorder    Past Surgical History:  Procedure Laterality Date   APPENDECTOMY  09/15/2015   CARPAL TUNNEL RELEASE Bilateral    INCISIONAL HERNIA REPAIR N/A 09/29/2015   Procedure: HERNIA REPAIR INCISIONAL;  Surgeon: Debby Shipper, MD;  Location: [facility] OR;  Service: General;  Laterality: N/A;   LAPAROSCOPIC APPENDECTOMY N/A 09/15/2015   Procedure: APPENDECTOMY LAPAROSCOPIC;  Surgeon: Lynwood Pina, MD;  Location: [facility] OR;  Service: General;  Laterality: N/A;   LUMPS ON BACK  2013   WISDOM TOOTH EXTRACTION     Patient Active Problem List   Diagnosis Date Noted   Diabetes mellitus (HCC) 06/26/2022   Chronic pain of both knees 11/26/2017   Hyperlipidemia 12/25/2016   Anxiety 06/06/2016   Essential hypertension 11/04/2015   Abdominal hernia 09/28/2015   Appendicitis 09/15/2015   Tobacco use disorder 04/21/2015   Adjustment disorder with anxious mood 04/21/2015   Health care maintenance 04/21/2015   GERD (gastroesophageal reflux disease) 04/21/2015    PCP: Delbert Clam, MD  REFERRING PROVIDER: Delbert Clam, MD  REFERRING DIAG: [phone] (ICD-10-CM) - Chronic left-sided low back pain with left-sided sciatica M25.561,M25.562 (ICD-10-CM) - Arthralgia of  both knees  Rationale for Evaluation and Treatment: Rehabilitation  THERAPY DIAG:  Chronic pain of both knees  Muscle weakness (generalized)  Other low back pain  ONSET DATE: several years for back and knees  SUBJECTIVE:                                                                                                                                                                                           SUBJECTIVE STATEMENT: Pt reports that his back and knees are  not hurting right now because he has been off work for 1 week. Started back to work yesterday. Did not receive HEP handout.    EVAL: Pt endorses initial back injury when he was 20 and fell off a roof, has had issues since that slowly seem to be worsening as time goes on. He also states he was very active with sports when he was younger and has had a demanding career working with maintenance. He endorses gradual onset of knee pain for several years that also seem to worsening. He endorses difficulty sleeping due to pain (waking 4-5x/night on avg), difficulty with household tasks, self care, and work tasks. Occasionally has LLE referral when lying on L side or when walking for a long time. Does have some BIL foot numbness at times which he states has been ongoing for past couple of years. He does endorse getting PT for these issues in the past and got some relief, but had to stop due to finances. States symptoms feel the same in quality but are slowly worsening with time. He denies bowel/bladder issues, saddle anesthesia or sensory changes, fevers/chills.     PERTINENT HISTORY:  anxiety/depression, HTN, GERD  PAIN:  Are you having pain: 0/10 back, 0/10 knees  Location/description: L low back and hip, BIL knees Best-worst over past week: Back 4-10/10,  0-10/10 - aggravating factors: bending forward (particularly prolonged), transfers, bed transfers, lifting (case of water), ADLs - Easing factors: heat, hot showers/bath, brace  for knees  PRECAUTIONS: none   WEIGHT BEARING RESTRICTIONS: No  FALLS:  Has patient fallen in last 6 months? No  LIVING ENVIRONMENT: Lives in apartment, reports lots of stair navigation for work. Lives w/ spouse on first floor. Spouse does most of housework per pt report Has a lot of grandchildren  OCCUPATION: teaching laboratory technician, has to do a lot of stairs. Avoiding heavy lifting.   PLOF: Independent  PATIENT GOALS: wants to get his back better, be able to interact more with grandbabies  NEXT MD VISIT: ~5 months from eval   OBJECTIVE:  Note: Objective measures were completed at Evaluation unless otherwise noted.  DIAGNOSTIC FINDINGS:  L shoulder XR ordered, not yet taken per review of EPIC Had lumbar/sacral XR and BIL knee XR in 2023, refer to EPIC for details  PATIENT SURVEYS:  FOTO deferred on eval FOTO 02/19/23 (2nd visit) Lumbar 49%, Knee 43%   SCREENING FOR RED FLAGS: Does report some chronic BIL foot numbness at times over past two years, no recent changes. No bowel/bladder changes, no fevers/chills, no saddle anesthesia or sensory issues endorsed.   COGNITION: Overall cognitive status: Within functional limits for tasks assessed     SENSATION/NEURO: Light touch testing confounded by BIL knee brace and boots No clonus either LE Negative hoffmann and tromner sign BIL No ataxia with gait   POSTURE: rounded shoulders, forward head  LUMBAR ROM:   AROM eval  Flexion Proximal shin *  Extension 100% *  Right lateral flexion   Left lateral flexion   Right rotation 25% s*  Left rotation 25% s*   (Blank rows = not tested) (Key: WFL = within functional limits not formally assessed, * = concordant pain, s = stiffness/stretching sensation, NT = not tested)   RANGE OF MOTION:     ROM Right eval Left eval  Shoulder flexion    Shoulder abduction    Functional ER combo    Functional IR combo    Knee extension Full but painful Full but painful  Knee flexion  A: 110 deg * A: 112 deg *  Ankle dorsiflexion     (Blank rows = not tested) (Key: WFL = within functional limits not formally assessed, * = concordant pain, s = stiffness/stretching sensation, NT = not tested)  Comments:    STRENGTH TESTING:  MMT Right eval Left eval  Shoulder flexion    Shoulder abduction    Shoulder ER    Shoulder IR    Elbow flexion    Elbow extension    Grip strength (gross)    Hip flexion 4 * 4- *  Hip abduction (modified sitting) 4 4  Knee flexion 4 4- *  Knee extension 4+ 4+  Ankle dorsiflexion 5 5  Ankle plantarflexion     (Blank rows = not tested) (Key: WFL = within functional limits not formally assessed, * = concordant pain, s = stiffness/stretching sensation, NT = not tested)  Comments:    LUMBAR SPECIAL TESTS:  Slump test: R negative ; L negative  SLR confounded by knee pain with extension BIL  FUNCTIONAL TESTS:  5xSTS: 20.43sec w UE support from thighs, knee>back pain  GAIT: Distance walked: within clinic Assistive device utilized: None Level of assistance: Complete Independence Comments: reduced gait speed/cadence, reduced truncal rotation  TODAY'S TREATMENT:                                                                                                                              [facility] Adult PT Treatment:                                                DATE: 02/19/23 Therapeutic Exercise: Nustep L 5 UE/LE x 5 minutes  Seated LAQ Seated MARCH Seated pelvic tilts  Supine PPT x 10 +HEP Supine clam blue band +HEP FOTO intake Updated HEP   [facility] Adult PT Treatment:                                                DATE: 01/23/23 Therapeutic Exercise: Seated thoracolumbar extension x8 LAQ x8 BIL Seated march x8 BIL HEP education    PATIENT EDUCATION:  Education details: Pt education on PT impairments, prognosis, and POC. Informed consent. Rationale for interventions, safe/appropriate HEP performance Person educated:  Patient Education method: Explanation, Demonstration, Tactile cues, Verbal cues, and Handouts Education comprehension: verbalized understanding, returned demonstration, verbal cues required, tactile cues required, and needs further education    HOME EXERCISE PROGRAM: Access Code: [email or web address] URL: [email or web address] Date: 01/23/2023 Prepared by: Alm Jenny  Exercises - Seated Thoracic Lumbar Extension  - 2-3 x daily - 1 sets - 8 reps - Seated Long Arc Quad  - 2-3 x daily - 1  sets - 8 reps - Seated March  - 2-3 x daily - 1 sets - 8 reps  ASSESSMENT:  CLINICAL IMPRESSION: Pt arrives reporting decreased pain as he has been off work for one week. Went back yesterday and is still feeling good. Did not receive print out from initial evaluation so time spent with HEP instruction and then progressed with Lumbar and knee mobility/ quad /hip activation. He reports feeling his muscles burning with therex, no increased pain, just fatigue. Reported feeling good at end of session.    EVAL: Patient is a pleasant 53 y.o. gentleman who was seen today for physical therapy evaluation and treatment for back and BIL knee pain ongoing for several years. Red flag questioning reassuring, most recent imaging XR in 2023 (refer to EPIC for details). Pt endorses difficulty w/ majority of daily/work tasks due to pain, most difficulty with prolonged standing/walking, lifting, and sleeping. On exam he demonstrates limitations in lumbar mobility w/ flexion being most provocative, and LE weakness that is more prominent on L (which he describes as most affected from both knee and back perspective). Slump testing unremarkable, 5xSTS is indicative of fall risk and reduced functional mobility. Does well with HEP, endorses improved symptoms afterwards. No adverse events. Recommend trial of skilled PT to address aforementioned deficits with aim of improving functional tolerance and reducing pain with typical  activities. Pt departs today's session in no acute distress, all voiced concerns/questions addressed appropriately from PT perspective.      OBJECTIVE IMPAIRMENTS: Abnormal gait, decreased activity tolerance, decreased endurance, decreased mobility, difficulty walking, decreased ROM, decreased strength, hypomobility, improper body mechanics, postural dysfunction, and pain.   ACTIVITY LIMITATIONS: carrying, lifting, bending, standing, squatting, sleeping, stairs, transfers, bed mobility, and locomotion level  PARTICIPATION LIMITATIONS: meal prep, cleaning, laundry, community activity, and occupation  PERSONAL FACTORS: Time since onset of injury/illness/exacerbation and 1-2 comorbidities: anxiety/depression, HTN  are also affecting patient's functional outcome.   REHAB POTENTIAL: Good  CLINICAL DECISION MAKING: Stable/uncomplicated  EVALUATION COMPLEXITY: Low   GOALS: Goals reviewed with patient? No - did discuss PT POC and role of PT  SHORT TERM GOALS: Target date: 02/20/2023 Pt will demonstrate appropriate understanding and performance of initially prescribed HEP in order to facilitate improved independence with management of symptoms.  Baseline: HEP provided on eval 02/19/23: reprinted today  Goal status: INITIAL   2. Pt will score increase FOTO score by at least MCID in order to demonstrate improved perception of function due to symptoms.  Baseline: FOTO TBD  02/19/23: see knee and lumbar FOTO scores   Goal status: INITIAL    LONG TERM GOALS: Target date: 03/20/2023 Pt will meet predicted score on FOTO in order to demonstrate improved perception of functional status due to symptoms.  Baseline: FOTO TBD 02/19/23: see baselines Goal status: INITIAL  2.  Pt will demonstrate lumbar flexion AROM to at least distal shin in order to demonstrate improved tolerance to ADLS such as lower body dressing. Baseline: proximal shin with pain Goal status: INITIAL  3.  Pt will demonstrate hip/knee MMT  of at least 4+/5 bilaterally in order to demonstrate improved strength for functional movements.  Baseline: see MMT chart above Goal status: INITIAL  4. Pt will perform 5xSTS in <12 sec in order to demonstrate reduced fall risk and improved functional independence. (MCID of 2.3sec)  Baseline: 20sec w/ UE support and knee/back pain  Goal status: INITIAL   5. Pt will report at least 50% decrease in overall pain levels in past week  in order to facilitate improved tolerance to basic ADLs/mobility.   Baseline: 4-10/10 back, 0-10/10 knees  Goal status: INITIAL    6. Pt will endorse waking no more than 2x/night on average over past week due to knee/back pain in order to facilitate improved overall health and QOL.  Baseline: 4-5x/night on average per pt report  Goals status: INITIAL  PLAN:  PT FREQUENCY: 2x/week  PT DURATION: 8 weeks  PLANNED INTERVENTIONS: 97164- PT Re-evaluation, 97110-Therapeutic exercises, 97530- Therapeutic activity, 97112- Neuromuscular re-education, 97535- Self Care, [id number]- Manual therapy, [phone]- Gait training, [phone]- Aquatic Therapy, Patient/Family education, Balance training, Stair training, Taping, Joint mobilization, Spinal mobilization, Cryotherapy, and Moist heat.  PLAN FOR NEXT SESSION: Review/update HEP PRN. Work on [employer] exercises as appropriate with emphasis on knee/hip strength, gentle lumbar mobility within pt tolerance. Symptom modification strategies as indicated/appropriate. FOTO as able - initial eval was erroneously set up for elbow.    Harlene Persons, PTA 02/19/23 2:03 PM Phone: [phone] Fax: [phone] S

## 2023-02-21 ENCOUNTER — Other Ambulatory Visit: Payer: Self-pay | Admitting: Family Medicine

## 2023-02-21 ENCOUNTER — Ambulatory Visit: Payer: BC Managed Care – PPO | Admitting: Physical Therapy

## 2023-02-21 ENCOUNTER — Encounter: Payer: Self-pay | Admitting: Physical Therapy

## 2023-02-21 DIAGNOSIS — G8929 Other chronic pain: Secondary | ICD-10-CM | POA: Diagnosis not present

## 2023-02-21 DIAGNOSIS — M5459 Other low back pain: Secondary | ICD-10-CM | POA: Diagnosis not present

## 2023-02-21 DIAGNOSIS — M25561 Pain in right knee: Secondary | ICD-10-CM | POA: Diagnosis not present

## 2023-02-21 DIAGNOSIS — M6281 Muscle weakness (generalized): Secondary | ICD-10-CM

## 2023-02-21 DIAGNOSIS — M25562 Pain in left knee: Secondary | ICD-10-CM | POA: Diagnosis not present

## 2023-02-21 NOTE — Therapy (Signed)
 OUTPATIENT PHYSICAL THERAPY THORACOLUMBAR TREATMENT    Patient Name: WHIT BRUNI MRN: [id number] DOB:11-Aug-1970, 53 y.o., male Today's Date: 02/21/2023  END OF SESSION:  PT End of Session - 02/21/23 1321     Visit Number 3    Number of Visits 17    Date for PT Re-Evaluation 03/20/23    Authorization Type BC[employer]- just gave insurance card to front desk 02/19/23    PT Start Time 1316    PT Stop Time 1354    PT Time Calculation (min) 38 min             Past Medical History:  Diagnosis Date   Adjustment disorder with anxious mood    Allergy    Anxiety    Arthritis    hands (09/28/2015)   Depression    GERD (gastroesophageal reflux disease)    Hyperlipidemia    Hypertension    Sleep apnea    age 53    Tobacco use disorder    Past Surgical History:  Procedure Laterality Date   APPENDECTOMY  09/15/2015   CARPAL TUNNEL RELEASE Bilateral    INCISIONAL HERNIA REPAIR N/A 09/29/2015   Procedure: HERNIA REPAIR INCISIONAL;  Surgeon: Debby Shipper, MD;  Location: [facility] OR;  Service: General;  Laterality: N/A;   LAPAROSCOPIC APPENDECTOMY N/A 09/15/2015   Procedure: APPENDECTOMY LAPAROSCOPIC;  Surgeon: Lynwood Pina, MD;  Location: [facility] OR;  Service: General;  Laterality: N/A;   LUMPS ON BACK  2013   WISDOM TOOTH EXTRACTION     Patient Active Problem List   Diagnosis Date Noted   Diabetes mellitus (HCC) 06/26/2022   Chronic pain of both knees 11/26/2017   Hyperlipidemia 12/25/2016   Anxiety 06/06/2016   Essential hypertension 11/04/2015   Abdominal hernia 09/28/2015   Appendicitis 09/15/2015   Tobacco use disorder 04/21/2015   Adjustment disorder with anxious mood 04/21/2015   Health care maintenance 04/21/2015   GERD (gastroesophageal reflux disease) 04/21/2015    PCP: Delbert Clam, MD  REFERRING PROVIDER: Delbert Clam, MD  REFERRING DIAG: [phone] (ICD-10-CM) - Chronic left-sided low back pain with left-sided sciatica M25.561,M25.562 (ICD-10-CM) - Arthralgia of  both knees  Rationale for Evaluation and Treatment: Rehabilitation  THERAPY DIAG:  Chronic pain of both knees  Muscle weakness (generalized)  Other low back pain  ONSET DATE: several years for back and knees  SUBJECTIVE:                                                                                                                                                                                           SUBJECTIVE STATEMENT: Pt reports that his back and knees felt  good after last session. A little tender in the knees today.   EVAL: Pt endorses initial back injury when he was 20 and fell off a roof, has had issues since that slowly seem to be worsening as time goes on. He also states he was very active with sports when he was younger and has had a demanding career working with maintenance. He endorses gradual onset of knee pain for several years that also seem to worsening. He endorses difficulty sleeping due to pain (waking 4-5x/night on avg), difficulty with household tasks, self care, and work tasks. Occasionally has LLE referral when lying on L side or when walking for a long time. Does have some BIL foot numbness at times which he states has been ongoing for past couple of years. He does endorse getting PT for these issues in the past and got some relief, but had to stop due to finances. States symptoms feel the same in quality but are slowly worsening with time. He denies bowel/bladder issues, saddle anesthesia or sensory changes, fevers/chills.     PERTINENT HISTORY:  anxiety/depression, HTN, GERD  PAIN:  Are you having pain: 0/10 back, 0/10 knees  Location/description: L low back and hip, BIL knees Best-worst over past week: Back 4-10/10,  0-10/10 - aggravating factors: bending forward (particularly prolonged), transfers, bed transfers, lifting (case of water), ADLs - Easing factors: heat, hot showers/bath, brace for knees  PRECAUTIONS: none   WEIGHT BEARING RESTRICTIONS:  No  FALLS:  Has patient fallen in last 6 months? No  LIVING ENVIRONMENT: Lives in apartment, reports lots of stair navigation for work. Lives w/ spouse on first floor. Spouse does most of housework per pt report Has a lot of grandchildren  OCCUPATION: teaching laboratory technician, has to do a lot of stairs. Avoiding heavy lifting.   PLOF: Independent  PATIENT GOALS: wants to get his back better, be able to interact more with grandbabies  NEXT MD VISIT: ~5 months from eval   OBJECTIVE:  Note: Objective measures were completed at Evaluation unless otherwise noted.  DIAGNOSTIC FINDINGS:  L shoulder XR ordered, not yet taken per review of EPIC Had lumbar/sacral XR and BIL knee XR in 2023, refer to EPIC for details  PATIENT SURVEYS:  FOTO deferred on eval FOTO 02/19/23 (2nd visit) Lumbar 49%, Knee 43%   SCREENING FOR RED FLAGS: Does report some chronic BIL foot numbness at times over past two years, no recent changes. No bowel/bladder changes, no fevers/chills, no saddle anesthesia or sensory issues endorsed.   COGNITION: Overall cognitive status: Within functional limits for tasks assessed     SENSATION/NEURO: Light touch testing confounded by BIL knee brace and boots No clonus either LE Negative hoffmann and tromner sign BIL No ataxia with gait   POSTURE: rounded shoulders, forward head  LUMBAR ROM:   AROM eval  Flexion Proximal shin *  Extension 100% *  Right lateral flexion   Left lateral flexion   Right rotation 25% s*  Left rotation 25% s*   (Blank rows = not tested) (Key: WFL = within functional limits not formally assessed, * = concordant pain, s = stiffness/stretching sensation, NT = not tested)   RANGE OF MOTION:     ROM Right eval Left eval  Shoulder flexion    Shoulder abduction    Functional ER combo    Functional IR combo    Knee extension Full but painful Full but painful  Knee flexion A: 110 deg * A: 112 deg *  Ankle dorsiflexion     (  Blank rows =  not tested) (Key: WFL = within functional limits not formally assessed, * = concordant pain, s = stiffness/stretching sensation, NT = not tested)  Comments:    STRENGTH TESTING:  MMT Right eval Left eval  Shoulder flexion    Shoulder abduction    Shoulder ER    Shoulder IR    Elbow flexion    Elbow extension    Grip strength (gross)    Hip flexion 4 * 4- *  Hip abduction (modified sitting) 4 4  Knee flexion 4 4- *  Knee extension 4+ 4+  Ankle dorsiflexion 5 5  Ankle plantarflexion     (Blank rows = not tested) (Key: WFL = within functional limits not formally assessed, * = concordant pain, s = stiffness/stretching sensation, NT = not tested)  Comments:    LUMBAR SPECIAL TESTS:  Slump test: R negative ; L negative  SLR confounded by knee pain with extension BIL  FUNCTIONAL TESTS:  5xSTS: 20.43sec w UE support from thighs, knee>back pain  GAIT: Distance walked: within clinic Assistive device utilized: None Level of assistance: Complete Independence Comments: reduced gait speed/cadence, reduced truncal rotation  TODAY'S TREATMENT:                                                                                                                              [facility] Adult PT Treatment:                                                DATE: 02/21/23 Therapeutic Exercise: Nustep L 5 UE/LE x 5 minutes  Seated LAQ 2# 10 x 2  Seated March 2# 10 x 2  Seated A <-> P pelvic tilts 10 x 2  Supine PPT 5 sec 10 x 2  SLR with ab brace 10 x 2 each Blue band supine clam 10 x 3 Bridge with feet over ball 10 x 2  H/s curls with feet on ball 10 x 2  SKTC LTR   [facility] Adult PT Treatment:                                                DATE: 02/19/23 Therapeutic Exercise: Nustep L 5 UE/LE x 5 minutes  Seated LAQ Seated MARCH Seated pelvic tilts  Supine PPT x 10 +HEP Supine clam blue band +HEP FOTO intake Updated HEP   [facility] Adult PT Treatment:                                                 DATE:  01/23/23 Therapeutic Exercise: Seated thoracolumbar extension x8 LAQ x8 BIL Seated march x8 BIL HEP education    PATIENT EDUCATION:  Education details: Pt education on PT impairments, prognosis, and POC. Informed consent. Rationale for interventions, safe/appropriate HEP performance Person educated: Patient Education method: Explanation, Demonstration, Tactile cues, Verbal cues, and Handouts Education comprehension: verbalized understanding, returned demonstration, verbal cues required, tactile cues required, and needs further education    HOME EXERCISE PROGRAM: Access Code: [email or web address] URL: [email or web address] Date: 01/23/2023 Prepared by: Alm Jenny  Exercises - Seated Thoracic Lumbar Extension  - 2-3 x daily - 1 sets - 8 reps - Seated Long Arc Quad  - 2-3 x daily - 1 sets - 8 reps - Seated March  - 2-3 x daily - 1 sets - 8 reps - Supine pelvic tilit  - 1 x daily - 7 x weekly - 2 sets - 10 reps - Hooklying Clamshell with Resistance  - 2 x daily - 7 x weekly - 3 sets - 10 reps - 5 hold - Supine Lower Trunk Rotation  - 1 x daily - 7 x weekly - 1 sets - 10 reps - Active straight leg raise  - 1 x daily - 7 x weekly - 2 sets - 10 reps  ASSESSMENT:  CLINICAL IMPRESSION: Pt arrives reporting that he felt good after last session, Only tenderness in knees on arrival. Progressed with Lumbar and knee mobility/ quad /hip activation using increased resistance and reps. He reports feeling his muscles burning with therex, no increased pain, just fatigue. Reported feeling good at end of session.    EVAL: Patient is a pleasant 53 y.o. gentleman who was seen today for physical therapy evaluation and treatment for back and BIL knee pain ongoing for several years. Red flag questioning reassuring, most recent imaging XR in 2023 (refer to EPIC for details). Pt endorses difficulty w/ majority of daily/work tasks due to pain, most difficulty with prolonged standing/walking,  lifting, and sleeping. On exam he demonstrates limitations in lumbar mobility w/ flexion being most provocative, and LE weakness that is more prominent on L (which he describes as most affected from both knee and back perspective). Slump testing unremarkable, 5xSTS is indicative of fall risk and reduced functional mobility. Does well with HEP, endorses improved symptoms afterwards. No adverse events. Recommend trial of skilled PT to address aforementioned deficits with aim of improving functional tolerance and reducing pain with typical activities. Pt departs today's session in no acute distress, all voiced concerns/questions addressed appropriately from PT perspective.      OBJECTIVE IMPAIRMENTS: Abnormal gait, decreased activity tolerance, decreased endurance, decreased mobility, difficulty walking, decreased ROM, decreased strength, hypomobility, improper body mechanics, postural dysfunction, and pain.   ACTIVITY LIMITATIONS: carrying, lifting, bending, standing, squatting, sleeping, stairs, transfers, bed mobility, and locomotion level  PARTICIPATION LIMITATIONS: meal prep, cleaning, laundry, community activity, and occupation  PERSONAL FACTORS: Time since onset of injury/illness/exacerbation and 1-2 comorbidities: anxiety/depression, HTN  are also affecting patient's functional outcome.   REHAB POTENTIAL: Good  CLINICAL DECISION MAKING: Stable/uncomplicated  EVALUATION COMPLEXITY: Low   GOALS: Goals reviewed with patient? No - did discuss PT POC and role of PT  SHORT TERM GOALS: Target date: 02/20/2023 Pt will demonstrate appropriate understanding and performance of initially prescribed HEP in order to facilitate improved independence with management of symptoms.  Baseline: HEP provided on eval 02/19/23: reprinted today  Goal status: INITIAL   2. Pt will score increase FOTO score by at least MCID in order to demonstrate improved  perception of function due to symptoms.  Baseline: FOTO  TBD  02/19/23: see knee and lumbar FOTO scores   Goal status: INITIAL    LONG TERM GOALS: Target date: 03/20/2023 Pt will meet predicted score on FOTO in order to demonstrate improved perception of functional status due to symptoms.  Baseline: FOTO TBD 02/19/23: see baselines Goal status: INITIAL  2.  Pt will demonstrate lumbar flexion AROM to at least distal shin in order to demonstrate improved tolerance to ADLS such as lower body dressing. Baseline: proximal shin with pain Goal status: INITIAL  3.  Pt will demonstrate hip/knee MMT of at least 4+/5 bilaterally in order to demonstrate improved strength for functional movements.  Baseline: see MMT chart above Goal status: INITIAL  4. Pt will perform 5xSTS in <12 sec in order to demonstrate reduced fall risk and improved functional independence. (MCID of 2.3sec)  Baseline: 20sec w/ UE support and knee/back pain  Goal status: INITIAL   5. Pt will report at least 50% decrease in overall pain levels in past week in order to facilitate improved tolerance to basic ADLs/mobility.   Baseline: 4-10/10 back, 0-10/10 knees  Goal status: INITIAL    6. Pt will endorse waking no more than 2x/night on average over past week due to knee/back pain in order to facilitate improved overall health and QOL.  Baseline: 4-5x/night on average per pt report  Goals status: INITIAL  PLAN:  PT FREQUENCY: 2x/week  PT DURATION: 8 weeks  PLANNED INTERVENTIONS: 97164- PT Re-evaluation, 97110-Therapeutic exercises, 97530- Therapeutic activity, 97112- Neuromuscular re-education, 97535- Self Care, 02859- Manual therapy, [id number]- Gait training, [phone]- Aquatic Therapy, Patient/Family education, Balance training, Stair training, Taping, Joint mobilization, Spinal mobilization, Cryotherapy, and Moist heat.  PLAN FOR NEXT SESSION: Review/update HEP PRN. Work on [employer] exercises as appropriate with emphasis on knee/hip strength, gentle lumbar mobility within pt tolerance.  Symptom modification strategies as indicated/appropriate. FOTO status as needed.   Harlene Persons, PTA 02/21/23 1:55 PM Phone: [phone] Fax: [phone] S

## 2023-02-22 ENCOUNTER — Other Ambulatory Visit: Payer: Self-pay | Admitting: Family Medicine

## 2023-02-22 ENCOUNTER — Encounter: Payer: Self-pay | Admitting: Family Medicine

## 2023-02-22 DIAGNOSIS — E1169 Type 2 diabetes mellitus with other specified complication: Secondary | ICD-10-CM

## 2023-02-22 DIAGNOSIS — E1159 Type 2 diabetes mellitus with other circulatory complications: Secondary | ICD-10-CM

## 2023-02-25 ENCOUNTER — Ambulatory Visit: Payer: BC Managed Care – PPO | Admitting: Orthopedic Surgery

## 2023-02-25 ENCOUNTER — Other Ambulatory Visit: Payer: Self-pay | Admitting: Family Medicine

## 2023-02-25 ENCOUNTER — Other Ambulatory Visit (INDEPENDENT_AMBULATORY_CARE_PROVIDER_SITE_OTHER): Payer: Self-pay

## 2023-02-25 ENCOUNTER — Other Ambulatory Visit (INDEPENDENT_AMBULATORY_CARE_PROVIDER_SITE_OTHER): Payer: BC Managed Care – PPO

## 2023-02-25 DIAGNOSIS — M25511 Pain in right shoulder: Secondary | ICD-10-CM

## 2023-02-25 DIAGNOSIS — M25512 Pain in left shoulder: Secondary | ICD-10-CM | POA: Diagnosis not present

## 2023-02-25 DIAGNOSIS — D179 Benign lipomatous neoplasm, unspecified: Secondary | ICD-10-CM

## 2023-02-25 MED ORDER — PROMETHAZINE-DM 6.25-15 MG/5ML PO SYRP: 5.0000 mL | ORAL_SOLUTION | Freq: Four times a day (QID) | ORAL | 0 refills | Status: DC | PRN

## 2023-02-26 ENCOUNTER — Encounter: Payer: Self-pay | Admitting: Physical Therapy

## 2023-02-26 ENCOUNTER — Encounter: Payer: Self-pay | Admitting: Orthopedic Surgery

## 2023-02-26 ENCOUNTER — Ambulatory Visit: Payer: BC Managed Care – PPO | Admitting: Physical Therapy

## 2023-02-26 DIAGNOSIS — M6281 Muscle weakness (generalized): Secondary | ICD-10-CM

## 2023-02-26 DIAGNOSIS — G8929 Other chronic pain: Secondary | ICD-10-CM | POA: Diagnosis not present

## 2023-02-26 DIAGNOSIS — M5459 Other low back pain: Secondary | ICD-10-CM | POA: Diagnosis not present

## 2023-02-26 DIAGNOSIS — M25562 Pain in left knee: Secondary | ICD-10-CM | POA: Diagnosis not present

## 2023-02-26 DIAGNOSIS — M25561 Pain in right knee: Secondary | ICD-10-CM | POA: Diagnosis not present

## 2023-02-26 NOTE — Progress Notes (Signed)
 Surgical Instructions   Your procedure is scheduled on Friday, January 17th, 2025. Report to [facility] Main Entrance "A" at 5:30 A.M., then check in with the Admitting office. Any questions or running late day of surgery: call [phone]  Questions prior to your surgery date: call [phone], Monday-Friday, 8am-4pm. If you experience any cold or flu symptoms such as cough, fever, chills, shortness of breath, etc. between now and your scheduled surgery, please notify us  at the above number.     Remember:  Do not eat after midnight the night before your surgery   You may drink clear liquids until 4:30 the morning of your surgery.   Clear liquids allowed are: Water, Non-Citrus Juices (without pulp), Carbonated Beverages, Clear Tea (no milk, honey, etc.), Black Coffee Only (NO MILK, CREAM OR POWDERED CREAMER of any kind), and Gatorade.    Take these medicines the morning of surgery with A SIP OF WATER: Amlodipine  (Norvasc ) Buspirone  (Buspar ) Cetirizine  (Zyrtec ) Fluticasone  (Flonase ) Gabapentin  (Neurontin ) Omeprazole  (Prilosec)   May take these medicines IF NEEDED: None.     One week prior to surgery, STOP taking any Aspirin (unless otherwise instructed by your surgeon) Aleve, Naproxen, Ibuprofen , Motrin , Advil , Goody's, BC's, all herbal medications, fish oil, and non-prescription vitamins.  This includes Meloxicam  (Mobic ).                        Do NOT Smoke (Tobacco/Vaping) for 24 hours prior to your procedure.  If you use a CPAP at night, you may bring your mask/headgear for your overnight stay.   You will be asked to remove any contacts, glasses, piercing's, hearing aid's, dentures/partials prior to surgery. Please bring cases for these items if needed.    Patients discharged the day of surgery will not be allowed to drive home, and someone needs to stay with them for 24 hours.  SURGICAL WAITING ROOM VISITATION Patients may have no more than 2 support people in the  waiting area - these visitors may rotate.   Pre-op nurse will coordinate an appropriate time for 1 ADULT support person, who may not rotate, to accompany patient in pre-op.  Children under the age of 1 must have an adult with them who is not the patient and must remain in the main waiting area with an adult.  If the patient needs to stay at the hospital during part of their recovery, the visitor guidelines for inpatient rooms apply.  Please refer to the [facility] website for the visitor guidelines for any additional information.   If you received a COVID test during your pre-op visit  it is requested that you wear a mask when out in public, stay away from anyone that may not be feeling well and notify your surgeon if you develop symptoms. If you have been in contact with anyone that has tested positive in the last 10 days please notify you surgeon.      Pre-operative CHG Bathing Instructions   You can play a key role in reducing the risk of infection after surgery. Your skin needs to be as free of germs as possible. You can reduce the number of germs on your skin by washing with CHG (chlorhexidine  gluconate) soap before surgery. CHG is an antiseptic soap that kills germs and continues to kill germs even after washing.   DO NOT use if you have an allergy to chlorhexidine /CHG or antibacterial soaps. If your skin becomes reddened or irritated, stop using the CHG and notify one  of our RNs at [phone].              TAKE A SHOWER THE NIGHT BEFORE SURGERY AND THE DAY OF SURGERY    Please keep in mind the following:  DO NOT shave, including legs and underarms, 48 hours prior to surgery.   You may shave your face before/day of surgery.  Place clean sheets on your bed the night before surgery Use a clean washcloth (not used since being washed) for each shower. DO NOT sleep with pet's night before surgery.  CHG Shower Instructions:  Wash your face and private area with normal soap. If you  choose to wash your hair, wash first with your normal shampoo.  After you use shampoo/soap, rinse your hair and body thoroughly to remove shampoo/soap residue.  Turn the water OFF and apply half the bottle of CHG soap to a CLEAN washcloth.  Apply CHG soap ONLY FROM YOUR NECK DOWN TO YOUR TOES (washing for 3-5 minutes)  DO NOT use CHG soap on face, private areas, open wounds, or sores.  Pay special attention to the area where your surgery is being performed.  If you are having back surgery, having someone wash your back for you may be helpful. Wait 2 minutes after CHG soap is applied, then you may rinse off the CHG soap.  Pat dry with a clean towel  Put on clean pajamas    Additional instructions for the day of surgery: DO NOT APPLY any lotions, deodorants, cologne, or perfumes.   Do not wear jewelry or makeup Do not wear nail polish, gel polish, artificial nails, or any other type of covering on natural nails (fingers and toes) Do not bring valuables to the hospital. [facility] is not responsible for valuables/personal belongings. Put on clean/comfortable clothes.  Please brush your teeth.  Ask your nurse before applying any prescription medications to the skin.

## 2023-02-26 NOTE — Therapy (Signed)
 OUTPATIENT PHYSICAL THERAPY THORACOLUMBAR TREATMENT    Patient Name: COLAN LAYMON MRN: [id number] DOB:March 12, 1970, 53 y.o., male Today's Date: 02/26/2023  END OF SESSION:  PT End of Session - 02/26/23 1320     Visit Number 4    Number of Visits 17    Date for PT Re-Evaluation 03/20/23    Authorization Type BCBC no auth required    PT Start Time 1318    PT Stop Time 1356    PT Time Calculation (min) 38 min             Past Medical History:  Diagnosis Date   Adjustment disorder with anxious mood    Allergy    Anxiety    Arthritis    hands (09/28/2015)   Depression    GERD (gastroesophageal reflux disease)    Hyperlipidemia    Hypertension    Sleep apnea    age 23    Tobacco use disorder    Past Surgical History:  Procedure Laterality Date   APPENDECTOMY  09/15/2015   CARPAL TUNNEL RELEASE Bilateral    INCISIONAL HERNIA REPAIR N/A 09/29/2015   Procedure: HERNIA REPAIR INCISIONAL;  Surgeon: Debby Shipper, MD;  Location: [facility] OR;  Service: General;  Laterality: N/A;   LAPAROSCOPIC APPENDECTOMY N/A 09/15/2015   Procedure: APPENDECTOMY LAPAROSCOPIC;  Surgeon: Lynwood Pina, MD;  Location: [facility] OR;  Service: General;  Laterality: N/A;   LUMPS ON BACK  2013   WISDOM TOOTH EXTRACTION     Patient Active Problem List   Diagnosis Date Noted   Diabetes mellitus (HCC) 06/26/2022   Chronic pain of both knees 11/26/2017   Hyperlipidemia 12/25/2016   Anxiety 06/06/2016   Essential hypertension 11/04/2015   Abdominal hernia 09/28/2015   Appendicitis 09/15/2015   Tobacco use disorder 04/21/2015   Adjustment disorder with anxious mood 04/21/2015   Health care maintenance 04/21/2015   GERD (gastroesophageal reflux disease) 04/21/2015    PCP: Delbert Clam, MD  REFERRING PROVIDER: Delbert Clam, MD  REFERRING DIAG: [phone] (ICD-10-CM) - Chronic left-sided low back pain with left-sided sciatica M25.561,M25.562 (ICD-10-CM) - Arthralgia of both knees  Rationale for  Evaluation and Treatment: Rehabilitation  THERAPY DIAG:  Chronic pain of both knees  Muscle weakness (generalized)  Other low back pain  ONSET DATE: several years for back and knees  SUBJECTIVE:                                                                                                                                                                                           SUBJECTIVE STATEMENT: Pt reports back and knees are doing alright. Back was hurting a little  yesterday. Had some pain in back at end of day. Working 12 hour shifts.   EVAL: Pt endorses initial back injury when he was 20 and fell off a roof, has had issues since that slowly seem to be worsening as time goes on. He also states he was very active with sports when he was younger and has had a demanding career working with maintenance. He endorses gradual onset of knee pain for several years that also seem to worsening. He endorses difficulty sleeping due to pain (waking 4-5x/night on avg), difficulty with household tasks, self care, and work tasks. Occasionally has LLE referral when lying on L side or when walking for a long time. Does have some BIL foot numbness at times which he states has been ongoing for past couple of years. He does endorse getting PT for these issues in the past and got some relief, but had to stop due to finances. States symptoms feel the same in quality but are slowly worsening with time. He denies bowel/bladder issues, saddle anesthesia or sensory changes, fevers/chills.     PERTINENT HISTORY:  anxiety/depression, HTN, GERD  PAIN:  Are you having pain: 0/10 back, 0/10 knees  Location/description: L low back and hip, BIL knees Best-worst over past week: Back 4-10/10,  0-10/10 - aggravating factors: bending forward (particularly prolonged), transfers, bed transfers, lifting (case of water), ADLs - Easing factors: heat, hot showers/bath, brace for knees  PRECAUTIONS: none   WEIGHT BEARING  RESTRICTIONS: No  FALLS:  Has patient fallen in last 6 months? No  LIVING ENVIRONMENT: Lives in apartment, reports lots of stair navigation for work. Lives w/ spouse on first floor. Spouse does most of housework per pt report Has a lot of grandchildren  OCCUPATION: teaching laboratory technician, has to do a lot of stairs. Avoiding heavy lifting.   PLOF: Independent  PATIENT GOALS: wants to get his back better, be able to interact more with grandbabies  NEXT MD VISIT: ~5 months from eval   OBJECTIVE:  Note: Objective measures were completed at Evaluation unless otherwise noted.  DIAGNOSTIC FINDINGS:  L shoulder XR ordered, not yet taken per review of EPIC Had lumbar/sacral XR and BIL knee XR in 2023, refer to EPIC for details  PATIENT SURVEYS:  FOTO deferred on eval FOTO 02/19/23 (2nd visit) Lumbar 49%, Knee 43%   SCREENING FOR RED FLAGS: Does report some chronic BIL foot numbness at times over past two years, no recent changes. No bowel/bladder changes, no fevers/chills, no saddle anesthesia or sensory issues endorsed.   COGNITION: Overall cognitive status: Within functional limits for tasks assessed     SENSATION/NEURO: Light touch testing confounded by BIL knee brace and boots No clonus either LE Negative hoffmann and tromner sign BIL No ataxia with gait   POSTURE: rounded shoulders, forward head  LUMBAR ROM:   AROM eval 02/26/23  Flexion Proximal shin * Touches Toes   Extension 100% *   Right lateral flexion    Left lateral flexion    Right rotation 25% s* 75% s*  Left rotation 25% s* 75%  s*   (Blank rows = not tested) (Key: WFL = within functional limits not formally assessed, * = concordant pain, s = stiffness/stretching sensation, NT = not tested)   RANGE OF MOTION:     ROM Right eval Left eval Right 02/26/23 Left 02/26/23  Shoulder flexion      Shoulder abduction      Functional ER combo      Functional IR combo  Knee extension Full but painful Full  but painful    Knee flexion A: 110 deg * A: 112 deg * 130 130  Ankle dorsiflexion       (Blank rows = not tested) (Key: WFL = within functional limits not formally assessed, * = concordant pain, s = stiffness/stretching sensation, NT = not tested)  Comments:    STRENGTH TESTING:  MMT Right eval Left eval  Shoulder flexion    Shoulder abduction    Shoulder ER    Shoulder IR    Elbow flexion    Elbow extension    Grip strength (gross)    Hip flexion 4 * 4- *  Hip abduction (modified sitting) 4 4  Knee flexion 4 4- *  Knee extension 4+ 4+  Ankle dorsiflexion 5 5  Ankle plantarflexion     (Blank rows = not tested) (Key: WFL = within functional limits not formally assessed, * = concordant pain, s = stiffness/stretching sensation, NT = not tested)  Comments:    LUMBAR SPECIAL TESTS:  Slump test: R negative ; L negative  SLR confounded by knee pain with extension BIL  FUNCTIONAL TESTS:  5xSTS: 20.43sec w UE support from thighs, knee>back pain 02/26/23: 14.8 sec without UE , no increased pain   GAIT: Distance walked: within clinic Assistive device utilized: None Level of assistance: Complete Independence Comments: reduced gait speed/cadence, reduced truncal rotation  TODAY'S TREATMENT:                                                                                                                              [facility] Adult PT Treatment:                                                DATE: 02/26/23 Therapeutic Exercise: Nustep L5 UE/LE x 5 min Seated LAQ 3# 10 x 2  Seated  March 3# 10 x 2  STS x 10  Palloff press 1 GTB 10 x 2 each direction SLR with ab brace 10 x 2 each S/L clam AROM x 10 each , with red band x 10 each  Bridge 10 x 2  Single leg bridge 2 x 10 each  H/s curls with feet on ball 10 x 2 - added UE touch to knees 2 nd set  Seated Lumbar flexion 10 sec x 5  Lumbar AROM, Knee AROM     [facility] Adult PT Treatment:                                                DATE:  02/21/23 Therapeutic Exercise: Nustep L 5 UE/LE x 5 minutes  Seated LAQ 2# 10 x 2  Seated  March 2# 10 x 2  Seated A <-> P pelvic tilts 10 x 2  Supine PPT 5 sec 10 x 2  SLR with ab brace 10 x 2 each Blue band supine clam 10 x 3 Bridge with feet over ball 10 x 2  H/s curls with feet on ball 10 x 2  SKTC LTR   [facility] Adult PT Treatment:                                                DATE: 02/19/23 Therapeutic Exercise: Nustep L 5 UE/LE x 5 minutes  Seated LAQ Seated MARCH Seated pelvic tilts  Supine PPT x 10 +HEP Supine clam blue band +HEP FOTO intake Updated HEP   [facility] Adult PT Treatment:                                                DATE: 01/23/23 Therapeutic Exercise: Seated thoracolumbar extension x8 LAQ x8 BIL Seated march x8 BIL HEP education    PATIENT EDUCATION:  Education details: Pt education on PT impairments, prognosis, and POC. Informed consent. Rationale for interventions, safe/appropriate HEP performance Person educated: Patient Education method: Explanation, Demonstration, Tactile cues, Verbal cues, and Handouts Education comprehension: verbalized understanding, returned demonstration, verbal cues required, tactile cues required, and needs further education    HOME EXERCISE PROGRAM: Access Code: [email or web address] URL: [email or web address] Date: 01/23/2023 Prepared by: Alm Jenny  Exercises - Seated Thoracic Lumbar Extension  - 2-3 x daily - 1 sets - 8 reps - Seated Long Arc Quad  - 2-3 x daily - 1 sets - 8 reps - Seated March  - 2-3 x daily - 1 sets - 8 reps - Supine pelvic tilit  - 1 x daily - 7 x weekly - 2 sets - 10 reps - Hooklying Clamshell with Resistance  - 2 x daily - 7 x weekly - 3 sets - 10 reps - 5 hold - Supine Lower Trunk Rotation  - 1 x daily - 7 x weekly - 1 sets - 10 reps - Active straight leg raise  - 1 x daily - 7 x weekly - 2 sets - 10 reps  ASSESSMENT:  CLINICAL IMPRESSION: Pt arrives reporting that he felt good after  last session, has some low back pain by end of 12 hour work shifts. He demonstrates improved lumbar and quad flexibility. Able to progress demand of therex and pt eager to progress to higher level gym machines. Tolerated progression well today. STG# 1, LTG #3 met. FOTO next session and check progress.     EVAL: Patient is a pleasant 53 y.o. gentleman who was seen today for physical therapy evaluation and treatment for back and BIL knee pain ongoing for several years. Red flag questioning reassuring, most recent imaging XR in 2023 (refer to EPIC for details). Pt endorses difficulty w/ majority of daily/work tasks due to pain, most difficulty with prolonged standing/walking, lifting, and sleeping. On exam he demonstrates limitations in lumbar mobility w/ flexion being most provocative, and LE weakness that is more prominent on L (which he describes as most affected from both knee and back perspective). Slump testing unremarkable, 5xSTS is indicative of fall risk and reduced functional mobility. Does well with HEP,  endorses improved symptoms afterwards. No adverse events. Recommend trial of skilled PT to address aforementioned deficits with aim of improving functional tolerance and reducing pain with typical activities. Pt departs today's session in no acute distress, all voiced concerns/questions addressed appropriately from PT perspective.      OBJECTIVE IMPAIRMENTS: Abnormal gait, decreased activity tolerance, decreased endurance, decreased mobility, difficulty walking, decreased ROM, decreased strength, hypomobility, improper body mechanics, postural dysfunction, and pain.   ACTIVITY LIMITATIONS: carrying, lifting, bending, standing, squatting, sleeping, stairs, transfers, bed mobility, and locomotion level  PARTICIPATION LIMITATIONS: meal prep, cleaning, laundry, community activity, and occupation  PERSONAL FACTORS: Time since onset of injury/illness/exacerbation and 1-2 comorbidities:  anxiety/depression, HTN  are also affecting patient's functional outcome.   REHAB POTENTIAL: Good  CLINICAL DECISION MAKING: Stable/uncomplicated  EVALUATION COMPLEXITY: Low   GOALS: Goals reviewed with patient? No - did discuss PT POC and role of PT  SHORT TERM GOALS: Target date: 02/20/2023 Pt will demonstrate appropriate understanding and performance of initially prescribed HEP in order to facilitate improved independence with management of symptoms.  Baseline: HEP provided on eval 02/19/23: reprinted today  Goal status: MET  2. Pt will score increase FOTO score by at least MCID in order to demonstrate improved perception of function due to symptoms.  Baseline: FOTO TBD  02/19/23: see knee and lumbar FOTO scores   Goal status: INITIAL    LONG TERM GOALS: Target date: 03/20/2023 Pt will meet predicted score on FOTO in order to demonstrate improved perception of functional status due to symptoms.  Baseline: FOTO TBD 02/19/23: see baselines Goal status: INITIAL  2.  Pt will demonstrate lumbar flexion AROM to at least distal shin in order to demonstrate improved tolerance to ADLS such as lower body dressing. Baseline: proximal shin with pain 02/26/23: touches toes Goal status: MET  3.  Pt will demonstrate hip/knee MMT of at least 4+/5 bilaterally in order to demonstrate improved strength for functional movements.  Baseline: see MMT chart above Goal status: INITIAL  4. Pt will perform 5xSTS in <12 sec in order to demonstrate reduced fall risk and improved functional independence. (MCID of 2.3sec)  Baseline: 20sec w/ UE support and knee/back pain  Goal status: INITIAL   5. Pt will report at least 50% decrease in overall pain levels in past week in order to facilitate improved tolerance to basic ADLs/mobility.   Baseline: 4-10/10 back, 0-10/10 knees  Goal status: INITIAL    6. Pt will endorse waking no more than 2x/night on average over past week due to knee/back pain in order to  facilitate improved overall health and QOL.  Baseline: 4-5x/night on average per pt report  Goals status: INITIAL  PLAN:  PT FREQUENCY: 2x/week  PT DURATION: 8 weeks  PLANNED INTERVENTIONS: 97164- PT Re-evaluation, 97110-Therapeutic exercises, 97530- Therapeutic activity, 97112- Neuromuscular re-education, 97535- Self Care, 02859- Manual therapy, [phone]- Gait training, [phone]- Aquatic Therapy, Patient/Family education, Balance training, Stair training, Taping, Joint mobilization, Spinal mobilization, Cryotherapy, and Moist heat.  PLAN FOR NEXT SESSION: Review/update HEP PRN. Work on [employer] exercises as appropriate with emphasis on knee/hip strength, gentle lumbar mobility within pt tolerance. Symptom modification strategies as indicated/appropriate. FOTO next since pt will be out for 2 weeks with lipoma removal.    Harlene Persons, PTA 02/26/23 3:03 PM Phone: [phone] Fax: [phone]

## 2023-02-26 NOTE — Progress Notes (Signed)
 Office Visit Note   Patient: Timothy Mccarthy           Date of Birth: 01/07/1971           MRN: [id number] Visit Date: 02/25/2023 Requested by: Delbert Clam, MD [address] [place],  [place] [address] PCP: Delbert Clam, MD  Subjective: Chief Complaint  Patient presents with   Other    Bilateral shoulder pain    HPI: Timothy Mccarthy is a 53 y.o. 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  He has had them removed several years ago but reports recurrence.  He states that he would like to have the 1 on the right-hand side removed.  Does have some occasional discomfort but this is more of a mechanical obstruction to sitting in the chair.  He really cannot lay on his back because of the aggravation of the mechanical feel of this mass over his scapula.  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  Has a history of cortisone injection on the left-hand side..                ROS: All systems reviewed are negative as they relate to the chief complaint within the history of present illness.  Patient denies fevers or chills.  Assessment & Plan: Visit Diagnoses:  1. Left shoulder pain, unspecified chronicity   2. Right shoulder pain, unspecified chronicity   3. Lipoma, unspecified site     Plan: Impression is recurrent 4 x 4 centimeter lipoma which appears to be subcutaneous and does not have concerning features on ultrasound examination.  This is about 2 to 3 cm away from the prior incision over the right posterior shoulder near the scapular spine.  The mass itself is nontender.  Excision can be performed with the patient in the prone position.  The risk and benefits are discussed with the patient include not limited to infection or vessel damage incomplete pain relief as well as potential which is very small that this could be more than just a benign lipoma.  All questions answered.  Follow-Up Instructions: No follow-ups on file.    Orders:  Orders Placed This Encounter  Procedures   XR Shoulder Left   XR Shoulder Right   US  Extrem Up Right Ltd   No orders of the defined types were placed in this encounter.     Procedures: No procedures performed   Clinical Data: No additional findings.  Objective: Vital Signs: There were no vitals taken for this visit.  Physical Exam:  Constitutional: Patient appears well-developed HEENT:  Head: Normocephalic Eyes:EOM are normal Neck: Normal range of motion Cardiovascular: Normal rate Pulmonary/chest: Effort normal Neurologic: Patient is alert Skin: Skin is warm Psychiatric: Patient has normal mood and affect  Ortho Exam: Ortho exam demonstrates full active and passive range of motion of both shoulders.  Has well-healed surgical incision over the right shoulder over the scapular spine which is about 3 to 4 cm.  About 4 cm distal and lateral to this incision is a 4 x 4 centimeter lipoma which is nontender to touch and is freely mobile beneath the skin.  There is no lymphadenopathy in that shoulder girdle region.  Rotator cuff strength is intact bilateral shoulders with no coarse grinding or crepitus with internal/external rotation.  Specialty Comments:  No specialty comments available.  Imaging: No results found.   PMFS History: Patient Active Problem List   Diagnosis Date Noted  Diabetes mellitus (HCC) 06/26/2022   Chronic pain of both knees 11/26/2017   Hyperlipidemia 12/25/2016   Anxiety 06/06/2016   Essential hypertension 11/04/2015   Abdominal hernia 09/28/2015   Appendicitis 09/15/2015   Tobacco use disorder 04/21/2015   Adjustment disorder with anxious mood 04/21/2015   Health care maintenance 04/21/2015   GERD (gastroesophageal reflux disease) 04/21/2015   Past Medical History:  Diagnosis Date   Adjustment disorder with anxious mood    Allergy    Anxiety    Arthritis    hands (09/28/2015)   Depression    GERD (gastroesophageal reflux  disease)    Hyperlipidemia    Hypertension    Sleep apnea    age 59    Tobacco use disorder     Family History  Problem Relation Age of Onset   Stroke Mother    Diabetes type II Mother    Diabetes Mother    Colon polyps Neg Hx    Esophageal cancer Neg Hx    Rectal cancer Neg Hx    Stomach cancer Neg Hx     Past Surgical History:  Procedure Laterality Date   APPENDECTOMY  09/15/2015   CARPAL TUNNEL RELEASE Bilateral    INCISIONAL HERNIA REPAIR N/A 09/29/2015   Procedure: HERNIA REPAIR INCISIONAL;  Surgeon: Debby Shipper, MD;  Location: [facility] OR;  Service: General;  Laterality: N/A;   LAPAROSCOPIC APPENDECTOMY N/A 09/15/2015   Procedure: APPENDECTOMY LAPAROSCOPIC;  Surgeon: Lynwood Pina, MD;  Location: [facility] OR;  Service: General;  Laterality: N/A;   LUMPS ON BACK  2013   WISDOM TOOTH EXTRACTION     Social History   Occupational History   Not on file  Tobacco Use   Smoking status: Every Day    Current packs/day: 0.50    Average packs/day: 0.5 packs/day for 35.9 years (17.9 ttl pk-yrs)    Types: Cigarettes    Start date: 04/21/1987   Smokeless tobacco: Never   Tobacco comments:    Patches   Vaping Use   Vaping status: Never Used  Substance and Sexual Activity   Alcohol use: Yes    Alcohol/week: 4.0 standard drinks of alcohol    Types: 2 Cans of beer, 2 Shots of liquor per week    Comment: takes shots whenever has pain   Drug use: No   Sexual activity: Yes

## 2023-02-27 ENCOUNTER — Encounter (HOSPITAL_COMMUNITY)
Admission: RE | Admit: 2023-02-27 | Discharge: 2023-02-27 | Disposition: A | Discharge status: Home / Self Care | Payer: BC Managed Care – PPO | Source: Ambulatory Visit | Attending: Orthopedic Surgery | Admitting: Orthopedic Surgery

## 2023-02-27 ENCOUNTER — Other Ambulatory Visit: Payer: Self-pay

## 2023-02-27 ENCOUNTER — Encounter (HOSPITAL_COMMUNITY): Payer: Self-pay

## 2023-02-27 VITALS — BP 141/100 | HR 99 | Temp 98.1°F | Resp 17 | Ht 67.0 in | Wt 193.6 lb

## 2023-02-27 DIAGNOSIS — Z01818 Encounter for other preprocedural examination: Secondary | ICD-10-CM

## 2023-02-27 DIAGNOSIS — F32A Depression, unspecified: Secondary | ICD-10-CM | POA: Diagnosis not present

## 2023-02-27 DIAGNOSIS — G4733 Obstructive sleep apnea (adult) (pediatric): Secondary | ICD-10-CM | POA: Diagnosis not present

## 2023-02-27 DIAGNOSIS — M1990 Unspecified osteoarthritis, unspecified site: Secondary | ICD-10-CM | POA: Diagnosis not present

## 2023-02-27 DIAGNOSIS — F17210 Nicotine dependence, cigarettes, uncomplicated: Secondary | ICD-10-CM | POA: Diagnosis not present

## 2023-02-27 DIAGNOSIS — E785 Hyperlipidemia, unspecified: Secondary | ICD-10-CM | POA: Diagnosis not present

## 2023-02-27 DIAGNOSIS — Z01812 Encounter for preprocedural laboratory examination: Secondary | ICD-10-CM | POA: Insufficient documentation

## 2023-02-27 DIAGNOSIS — Z6830 Body mass index (BMI) 30.0-30.9, adult: Secondary | ICD-10-CM | POA: Diagnosis not present

## 2023-02-27 DIAGNOSIS — E66812 Obesity, class 2: Secondary | ICD-10-CM | POA: Diagnosis not present

## 2023-02-27 DIAGNOSIS — D1721 Benign lipomatous neoplasm of skin and subcutaneous tissue of right arm: Secondary | ICD-10-CM | POA: Diagnosis not present

## 2023-02-27 DIAGNOSIS — Z79899 Other long term (current) drug therapy: Secondary | ICD-10-CM | POA: Diagnosis not present

## 2023-02-27 DIAGNOSIS — I10 Essential (primary) hypertension: Secondary | ICD-10-CM | POA: Diagnosis not present

## 2023-02-27 DIAGNOSIS — K219 Gastro-esophageal reflux disease without esophagitis: Secondary | ICD-10-CM | POA: Diagnosis not present

## 2023-02-27 DIAGNOSIS — F419 Anxiety disorder, unspecified: Secondary | ICD-10-CM | POA: Diagnosis not present

## 2023-02-27 DIAGNOSIS — E119 Type 2 diabetes mellitus without complications: Secondary | ICD-10-CM | POA: Diagnosis not present

## 2023-02-27 HISTORY — DX: Type 2 diabetes mellitus without complications: E11.9

## 2023-02-27 LAB — BASIC METABOLIC PANEL
Anion gap: 8 (ref 5–15)
BUN: 7 mg/dL (ref 6–20)
CO2: 29 mmol/L (ref 22–32)
Calcium: 9.2 mg/dL (ref 8.9–10.3)
Chloride: 102 mmol/L (ref 98–111)
Creatinine, Ser: 1 mg/dL (ref 0.61–1.24)
GFR, Estimated: >60 mL/min (ref >60)
Glucose, Bld: 104 mg/dL — ABNORMAL HIGH (ref 70–99)
Potassium: 3.6 mmol/L (ref 3.5–5.1)
Sodium: 139 mmol/L (ref 135–145)

## 2023-02-27 LAB — HEMOGLOBIN A1C
Hgb A1c MFr Bld: 6.1 % — ABNORMAL HIGH (ref 4.8–5.6)
Mean Plasma Glucose: 128.37 mg/dL

## 2023-02-27 LAB — CBC
HCT: 47.2 % (ref 39.0–52.0)
Hemoglobin: 15.4 g/dL (ref 13.0–17.0)
MCH: 27.8 pg (ref 26.0–34.0)
MCHC: 32.6 g/dL (ref 30.0–36.0)
MCV: 85.2 fL (ref 80.0–100.0)
Platelets: 298 10*3/uL (ref 150–400)
RBC: 5.54 MIL/uL (ref 4.22–5.81)
RDW: 14.8 % (ref 11.5–15.5)
WBC: 10.2 10*3/uL (ref 4.0–10.5)
nRBC: 0 % (ref 0.0–0.2)

## 2023-02-27 LAB — GLUCOSE, CAPILLARY: Glucose-Capillary: 112 mg/dL — ABNORMAL HIGH (ref 70–99)

## 2023-02-27 NOTE — Progress Notes (Signed)
 PCP/Internal Med - Dr Joaquin Mulberry Cardiologist - n/a  Chest x-ray - n/a EKG - 02/27/23 Stress Test - n/a ECHO - n/a Cardiac Cath - n/a  ICD Pacemaker/Loop - n/a  Sleep Study -  Yes (04/2019) CPAP - uses nightly (autopap 5-15)  Diabetes Type 2, no meds, does not check blood sugar  Blood Thinner Instructions:  n/a  Aspirin Instructions: n/a  ERAS - Clear liquids til 4:30 AM DOS.  Anesthesia review: Yes  STOP now taking any Aspirin (unless otherwise instructed by your surgeon), Aleve, Naproxen, Ibuprofen , Motrin , Advil , Goody's, BC's, all herbal medications, fish oil, and all vitamins.   Coronavirus Screening Do you have any of the following symptoms:  Cough yes/no: No Fever (>100.27F)  yes/no: No Runny nose yes/no: No Sore throat yes/no: No Difficulty breathing/shortness of breath  yes/no: No  Have you traveled in the last 14 days and where? yes/no: No  Patient verbalized understanding of instructions that were given to them at the PAT appointment. Patient was also instructed that they will need to review over the PAT instructions again at home before surgery.

## 2023-02-28 ENCOUNTER — Ambulatory Visit: Payer: BC Managed Care – PPO | Admitting: Physical Therapy

## 2023-02-28 ENCOUNTER — Encounter: Payer: Self-pay | Admitting: Physical Therapy

## 2023-02-28 DIAGNOSIS — M25562 Pain in left knee: Secondary | ICD-10-CM | POA: Diagnosis not present

## 2023-02-28 DIAGNOSIS — M6281 Muscle weakness (generalized): Secondary | ICD-10-CM

## 2023-02-28 DIAGNOSIS — M5459 Other low back pain: Secondary | ICD-10-CM

## 2023-02-28 DIAGNOSIS — G8929 Other chronic pain: Secondary | ICD-10-CM | POA: Diagnosis not present

## 2023-02-28 DIAGNOSIS — M25561 Pain in right knee: Secondary | ICD-10-CM | POA: Diagnosis not present

## 2023-02-28 NOTE — Therapy (Addendum)
OUTPATIENT PHYSICAL THERAPY THORACOLUMBAR TREATMENT DISCHARGE NOTE PHYSICAL THERAPY DISCHARGE SUMMARY  Visits from Start of Care: 5  Current functional level related to goals / functional outcomes: Pt reports via telephone he has had lipoma removal and cannot come to therapy. He reports that he was doing well with exercises for back, but he has been home 2 weeks and will probably need to heal for 3 more weeks   Remaining deficits: None that pt reports for back and knees   Education / Equipment: HEP   Patient agrees to discharge. Patient goals were partially met. Patient is being discharged due to the patient's request.  Pt was encouraged  to continue HEP and to continue walking for exercise.  Pt was told he would need new referral since his recent surgery if he wants to return to PT.  Pt was in agreement   Patient Name: Timothy Mccarthy MRN: [id number] DOB:Jul 05, 1970, 53 y.o., male, male Today's Date: 02/28/2023  END OF SESSION:  PT End of Session - 02/28/23 1323     Visit Number 5    Number of Visits 17    Date for PT Re-Evaluation 03/20/23    Authorization Type BCBC no auth required    PT Start Time 1318    PT Stop Time 1356    PT Time Calculation (min) 38 min             Past Medical History:  Diagnosis Date   Adjustment disorder with anxious mood    Allergy    Anxiety    Arthritis    "hands" (09/28/2015) and knees   Depression    Diabetes mellitus without complication (HCC)    type 2, no meds, does not check blood sugar   GERD (gastroesophageal reflux disease)    Hyperlipidemia    Hypertension    Sleep apnea    age 53, uses nightly on autopap 5-15   Tobacco use disorder    Past Surgical History:  Procedure Laterality Date   APPENDECTOMY  09/15/2015   CARPAL TUNNEL RELEASE Bilateral    COLONOSCOPY  02/26/2019   INCISIONAL HERNIA REPAIR N/A 09/29/2015   Procedure: HERNIA REPAIR INCISIONAL;  Surgeon: Harriette Bouillon, MD;  Location: [facility] OR;  Service: General;   Laterality: N/A;   LAPAROSCOPIC APPENDECTOMY N/A 09/15/2015   Procedure: APPENDECTOMY LAPAROSCOPIC;  Surgeon: Jimmye Norman, MD;  Location: [facility] OR;  Service: General;  Laterality: N/A;   LUMPS ON BACK  2013   Lipomas removed   UPPER GI ENDOSCOPY  02/26/2019   WISDOM TOOTH EXTRACTION     Patient Active Problem List   Diagnosis Date Noted   Diabetes mellitus (HCC) 06/26/2022   Chronic pain of both knees 11/26/2017   Hyperlipidemia 12/25/2016   Anxiety 06/06/2016   Essential hypertension 11/04/2015   Abdominal hernia 09/28/2015   Appendicitis 09/15/2015   Tobacco use disorder 04/21/2015   Adjustment disorder with anxious mood 04/21/2015   Health care maintenance 04/21/2015   GERD (gastroesophageal reflux disease) 04/21/2015    PCP: Hoy Register, MD  REFERRING PROVIDER: Hoy Register, MD  REFERRING DIAG: [phone] (ICD-10-CM) - Chronic left-sided low back pain with left-sided sciatica M25.561,M25.562 (ICD-10-CM) - Arthralgia of both knees  Rationale for Evaluation and Treatment: Rehabilitation  THERAPY DIAG:  Chronic pain of both knees  Muscle weakness (generalized)  Other low back pain  ONSET DATE: several years for back and knees  SUBJECTIVE:  SUBJECTIVE STATEMENT: Pt reports back and knees are doing okay. No pain on arrival.  He is having a Shoulder Lipoma removed tomorrow and will have a 2 week absence.   EVAL: Pt endorses initial back injury when he was 20 and fell off a roof, has had issues since that slowly seem to be worsening as time goes on. He also states he was very active with sports when he was younger and has had a demanding career working with maintenance. He endorses gradual onset of knee pain for several years that also seem to worsening. He endorses difficulty sleeping  due to pain (waking 4-5x/night on avg), difficulty with household tasks, self care, and work tasks. Occasionally has LLE referral when lying on L side or when walking for a long time. Does have some BIL foot numbness at times which he states has been ongoing for past couple of years. He does endorse getting PT for these issues in the past and got some relief, but had to stop due to finances. States symptoms feel the same in quality but are slowly worsening with time. He denies bowel/bladder issues, saddle anesthesia or sensory changes, fevers/chills.     PERTINENT HISTORY:  anxiety/depression, HTN, GERD  PAIN:  Are you having pain: 0/10 back, 0/10 knees  Location/description: L low back and hip, BIL knees Best-worst over past week: Back 4-10/10,  0-10/10 - aggravating factors: bending forward (particularly prolonged), transfers, bed transfers, lifting (case of water), ADLs - Easing factors: heat, hot showers/bath, brace for knees  PRECAUTIONS: none   WEIGHT BEARING RESTRICTIONS: No  FALLS:  Has patient fallen in last 6 months? No  LIVING ENVIRONMENT: Lives in apartment, reports lots of stair navigation for work. Lives w/ spouse on first floor. Spouse does most of housework per pt report Has a lot of grandchildren  OCCUPATION: Teaching laboratory technician, has to do a lot of stairs. Avoiding heavy lifting.   PLOF: Independent  PATIENT GOALS: wants to get his back better, be able to interact more with grandbabies  NEXT MD VISIT: ~5 months from eval   OBJECTIVE:  Note: Objective measures were completed at Evaluation unless otherwise noted.  DIAGNOSTIC FINDINGS:  L shoulder XR ordered, not yet taken per review of EPIC Had lumbar/sacral XR and BIL knee XR in 2023, refer to EPIC for details  PATIENT SURVEYS:  FOTO deferred on eval FOTO 02/19/23 (2nd visit) Lumbar 49%, Knee 43%  FOTO 02/28/2567% back 72% knee  SCREENING FOR RED FLAGS: Does report some chronic BIL foot numbness at  times over past two years, no recent changes. No bowel/bladder changes, no fevers/chills, no saddle anesthesia or sensory issues endorsed.   COGNITION: Overall cognitive status: Within functional limits for tasks assessed     SENSATION/NEURO: Light touch testing confounded by BIL knee brace and boots No clonus either LE Negative hoffmann and tromner sign BIL No ataxia with gait   POSTURE: rounded shoulders, forward head  LUMBAR ROM:   AROM eval 02/26/23  Flexion Proximal shin * Touches Toes   Extension 100% *   Right lateral flexion    Left lateral flexion    Right rotation 25% s* 75% s*  Left rotation 25% s* 75%  s*   (Blank rows = not tested) (Key: WFL = within functional limits not formally assessed, * = concordant pain, s = stiffness/stretching sensation, NT = not tested)   RANGE OF MOTION:     ROM Right eval Left eval Right 02/26/23 Left 02/26/23  Shoulder flexion  Shoulder abduction      Functional ER combo      Functional IR combo      Knee extension Full but painful Full but painful    Knee flexion A: 110 deg * A: 112 deg * 130 130  Ankle dorsiflexion       (Blank rows = not tested) (Key: WFL = within functional limits not formally assessed, * = concordant pain, s = stiffness/stretching sensation, NT = not tested)  Comments:    STRENGTH TESTING:  MMT Right eval Left eval Right 02/28/23 Right  02/28/23  Shoulder flexion      Shoulder abduction      Shoulder ER      Shoulder IR      Elbow flexion      Elbow extension      Grip strength (gross)      Hip flexion 4 * 4- * 4+ 4+  Hip abduction (modified sitting) 4 4    Knee flexion 4 4- * 5 5  Knee extension 4+ 4+ 5 5  Ankle dorsiflexion 5 5    Ankle plantarflexion       (Blank rows = not tested) (Key: WFL = within functional limits not formally assessed, * = concordant pain, s = stiffness/stretching sensation, NT = not tested)  Comments:    LUMBAR SPECIAL TESTS:  Slump test: R negative ; L  negative  SLR confounded by knee pain with extension BIL  FUNCTIONAL TESTS:  5xSTS: 20.43sec w UE support from thighs, knee>back pain 02/26/23: 14.8 sec without UE , no increased pain   02/28/23: 10 sec without UE   GAIT: Distance walked: within clinic Assistive device utilized: None Level of assistance: Complete Independence Comments: reduced gait speed/cadence, reduced truncal rotation  TODAY'S TREATMENT:                                                                                                                              [facility] Adult PT Treatment:                                                DATE: 02/28/23 Therapeutic Exercise: Nustep L6 UE/LE x 5 min Knee ext 10# bilat 10 x 2  Knee flexion 25# bilat 10 x 2  Double GTB palloff press 10 x 2 each  Freemotion pull dowm bar 20# x 10, 23#  x10  Freemotion Row with bar 27# 10 x 2  Bridge x 10 each  Single leg bridge x 10 each SLR 10 x 2 each with ab brace   Therapeutic Activity: FOTO Met for both  back and knees  Goal check and discussion on progress    [facility] Adult PT Treatment:  DATE: 02/26/23 Therapeutic Exercise: Nustep L5 UE/LE x 5 min Seated LAQ 3# 10 x 2  Seated  March 3# 10 x 2  STS x 10  Palloff press 1 GTB 10 x 2 each direction SLR with ab brace 10 x 2 each S/L clam AROM x 10 each , with red band x 10 each  Bridge 10 x 2  Single leg bridge 2 x 10 each  H/s curls with feet on ball 10 x 2 - added UE touch to knees 2 nd set  Seated Lumbar flexion 10 sec x 5  Lumbar AROM, Knee AROM     [facility] Adult PT Treatment:                                                DATE: 02/21/23 Therapeutic Exercise: Nustep L 5 UE/LE x 5 minutes  Seated LAQ 2# 10 x 2  Seated March 2# 10 x 2  Seated A <-> P pelvic tilts 10 x 2  Supine PPT 5 sec 10 x 2  SLR with ab brace 10 x 2 each Blue band supine clam 10 x 3 Bridge with feet over ball 10 x 2  H/s curls with feet on ball 10 x 2   SKTC LTR     PATIENT EDUCATION:  Education details: Pt education on PT impairments, prognosis, and POC. Informed consent. Rationale for interventions, safe/appropriate HEP performance Person educated: Patient Education method: Explanation, Demonstration, Tactile cues, Verbal cues, and Handouts Education comprehension: verbalized understanding, returned demonstration, verbal cues required, tactile cues required, and needs further education    HOME EXERCISE PROGRAM: Access Code: [email or web address] URL: [email or web address] Date: 01/23/2023 Prepared by: Fransisco Hertz  Exercises - Seated Thoracic Lumbar Extension  - 2-3 x daily - 1 sets - 8 reps - Seated Long Arc Quad  - 2-3 x daily - 1 sets - 8 reps - Seated March  - 2-3 x daily - 1 sets - 8 reps - Supine pelvic tilit  - 1 x daily - 7 x weekly - 2 sets - 10 reps - Hooklying Clamshell with Resistance  - 2 x daily - 7 x weekly - 3 sets - 10 reps - 5 hold - Supine Lower Trunk Rotation  - 1 x daily - 7 x weekly - 1 sets - 10 reps - Active straight leg raise  - 1 x daily - 7 x weekly - 2 sets - 10 reps  ASSESSMENT:  CLINICAL IMPRESSION: Pt arrives reporting that he felt good after last session, no pain on arrival in back and knees. Does have surgery scheduled for tomorrow to remove Lipoma on back of shoulder. Will work from home for 2 weeks and see MD prior to resuming this course of PT. He reports overall improvement, noting decreased pain at night time. LTG# 6 met. Also, both FOTO score status exceeded goal for both back and Knee. LTG# 1 met. Overall pt demonstrates excellent progress and has MET most LTGs. He would like to return in 2 weeks after his post op recovery period to resume PT in order to transition to safe gym routine and finalize HEP.     EVAL: Patient is a pleasant 53 y.o. gentleman who was seen today for physical therapy evaluation and treatment for back and BIL knee pain ongoing for several years. Red flag  questioning reassuring, most recent imaging  XR in 2023 (refer to [facility] for details). Pt endorses difficulty w/ majority of daily/work tasks due to pain, most difficulty with prolonged standing/walking, lifting, and sleeping. On exam he demonstrates limitations in lumbar mobility w/ flexion being most provocative, and LE weakness that is more prominent on L (which he describes as most affected from both knee and back perspective). Slump testing unremarkable, 5xSTS is indicative of fall risk and reduced functional mobility. Does well with HEP, endorses improved symptoms afterwards. No adverse events. Recommend trial of skilled PT to address aforementioned deficits with aim of improving functional tolerance and reducing pain with typical activities. Pt departs today's session in no acute distress, all voiced concerns/questions addressed appropriately from PT perspective.      OBJECTIVE IMPAIRMENTS: Abnormal gait, decreased activity tolerance, decreased endurance, decreased mobility, difficulty walking, decreased ROM, decreased strength, hypomobility, improper body mechanics, postural dysfunction, and pain.   ACTIVITY LIMITATIONS: carrying, lifting, bending, standing, squatting, sleeping, stairs, transfers, bed mobility, and locomotion level  PARTICIPATION LIMITATIONS: meal prep, cleaning, laundry, community activity, and occupation  PERSONAL FACTORS: Time since onset of injury/illness/exacerbation and 1-2 comorbidities: anxiety/depression, HTN  are also affecting patient's functional outcome.   REHAB POTENTIAL: Good  CLINICAL DECISION MAKING: Stable/uncomplicated  EVALUATION COMPLEXITY: Low   GOALS: Goals reviewed with patient? No - did discuss PT POC and role of PT  SHORT TERM GOALS: Target date: 02/20/2023 Pt will demonstrate appropriate understanding and performance of initially prescribed HEP in order to facilitate improved independence with management of symptoms.  Baseline: HEP provided on  eval 02/19/23: reprinted today  Goal status: MET  2. Pt will score increase FOTO score by at least MCID in order to demonstrate improved perception of function due to symptoms.  Baseline: FOTO TBD  02/19/23: see knee and lumbar FOTO scores   Goal status: INITIAL    LONG TERM GOALS: Target date: 03/20/2023 Pt will meet predicted score on FOTO in order to demonstrate improved perception of functional status due to symptoms.  Baseline: FOTO TBD 02/19/23: see baselines Goal status: MET  2.  Pt will demonstrate lumbar flexion AROM to at least distal shin in order to demonstrate improved tolerance to ADLS such as lower body dressing. Baseline: proximal shin with pain 02/26/23: touches toes Goal status: MET  3.  Pt will demonstrate hip/knee MMT of at least 4+/5 bilaterally in order to demonstrate improved strength for functional movements.  Baseline: see MMT chart above Goal status: INITIAL  4. Pt will perform 5xSTS in <12 sec in order to demonstrate reduced fall risk and improved functional independence. (MCID of 2.3sec)  Baseline: 20sec w/ UE support and knee/back pain  02/28/23: 10 sec without UE support   Goal status: INITIAL   5. Pt will report at least 50% decrease in overall pain levels in past week in order to facilitate improved tolerance to basic ADLs/mobility.   Baseline: 4-10/10 back, 0-10/10 knees  02/28/23: pain related to activity, not as constant  Goal status: ONGOING  6. Pt will endorse waking no more than 2x/night on average over past week due to knee/back pain in order to facilitate improved overall health and QOL.  Baseline: 4-5x/night on average per pt report  02/28/23: less, sometimes does not wake due to pain at all.   Goals status: MET  PLAN:  PT FREQUENCY: 2x/week  PT DURATION: 8 weeks  PLANNED INTERVENTIONS: 97164- PT Re-evaluation, 97110-Therapeutic exercises, 97530- Therapeutic activity, [id number]- Neuromuscular re-education, 97535- Self Care, 16109- Manual therapy,  [id number]- Gait training, [phone]- Aquatic  Therapy, Patient/Family education, Balance training, Stair training, Taping, Joint mobilization, Spinal mobilization, Cryotherapy, and Moist heat.  PLAN FOR NEXT SESSION:  Pt will be out for 2 weeks with lipoma removal. Review and progress as tolerated when he returns. Gym machines and general wellness exercise for knees and back. 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

## 2023-02-28 NOTE — Anesthesia Preprocedure Evaluation (Addendum)
Anesthesia Evaluation  Patient identified by MRN, date of birth, ID band Patient awake    Reviewed: Allergy & Precautions, NPO status , Patient's Chart, lab work & pertinent test results  Airway Mallampati: II  TM Distance: >3 FB Neck ROM: Full    Dental  (+) Teeth Intact, Dental Advisory Given   Pulmonary sleep apnea and Continuous Positive Airway Pressure Ventilation , Current Smoker and Patient abstained from smoking.   Pulmonary exam normal breath sounds clear to auscultation       Cardiovascular hypertension, Pt. on medications Normal cardiovascular exam Rhythm:Regular Rate:Normal     Neuro/Psych  PSYCHIATRIC DISORDERS Anxiety Depression    negative neurological ROS     GI/Hepatic Neg liver ROS,GERD  Medicated and Controlled,,  Endo/Other  diabetes, Well Controlled, Type 2  Obesity   Renal/GU negative Renal ROS     Musculoskeletal  (+) Arthritis ,  right posterior shoulder lipoma   Abdominal   Peds  Hematology negative hematology ROS (+)   Anesthesia Other Findings   Reproductive/Obstetrics                             Anesthesia Physical Anesthesia Plan  ASA: 2  Anesthesia Plan: General   Post-op Pain Management: Tylenol PO (pre-op)* and Toradol IV (intra-op)*   Induction: Intravenous  PONV Risk Score and Plan: 2 and Midazolam, Dexamethasone and Ondansetron  Airway Management Planned: Oral ETT  Additional Equipment:   Intra-op Plan:   Post-operative Plan: Extubation in OR  Informed Consent: I have reviewed the patients History and Physical, chart, labs and discussed the procedure including the risks, benefits and alternatives for the proposed anesthesia with the patient or authorized representative who has indicated his/her understanding and acceptance.     Dental advisory given  Plan Discussed with: CRNA  Anesthesia Plan Comments: (PAT note written 02/28/2023 by  Shonna Chock, PA-C.  )       Anesthesia Quick Evaluation

## 2023-02-28 NOTE — Progress Notes (Signed)
Anesthesia Chart Review:  Case: [id number] Date/Time: 03/01/23 0715   Procedure: RIGHT SHOULDER LIPOMA REMOVAL (Right: Shoulder)   Anesthesia type: General   Pre-op diagnosis: right posterior shoulder lipoma   [facility] RO[facility] 06 / [facility]   Surgeons: Cammy Copa, MD       DISCUSSION: Patient is a 53 year old male scheduled for the above procedure.  History includes smoking, HTN, GERD, HLD, OSA (Moderate obstructive sleep apnea, AHI = 16.7/h 05/07/19 sleep study), DM2, anxiety.  He had intermittently had prolonged QT on EKGs. QT/QTcB 408 ms/490 ms on 02/27/23 which was similar to 06/09/19 comparison EKG (QT/QTc 406/488). Anesthesia team to evaluate on the day of surgery.   VS: BP (!) 141/100 Comment: just took bp meds with the last 30 minutes  Pulse 99   Temp 36.7 C   Resp 17   Ht 5\' 7"  (1.702 m)   Wt 87.8 kg   SpO2 100%   BMI 30.32 kg/m  BP Readings from Last 3 Encounters:  02/27/23 (!) 141/100  12/31/22 123/87  06/26/22 129/85     PROVIDERS: Hoy Register, MD is PCP.  Evaluation 12/31/2022 for follow-up of chronic medical conditions.   LABS: Labs reviewed: Acceptable for surgery. (all labs ordered are listed, but only abnormal results are displayed)  Labs Reviewed  GLUCOSE, CAPILLARY - Abnormal; Notable for the following components:      Result Value   Glucose-Capillary 112 (*)    All other components within normal limits  HEMOGLOBIN A1C - Abnormal; Notable for the following components:   Hgb A1c MFr Bld 6.1 (*)    All other components within normal limits  BASIC METABOLIC PANEL - Abnormal; Notable for the following components:   Glucose, Bld 104 (*)    All other components within normal limits  CBC     EKG: Normal sinus rhythm Prolonged QT (408 ms QT; 490 ms QTcB) Abnormal ECG When compared with ECG of 09-Jun-2019 14:03, No significant change since last tracing Confirmed by Croitoru, Mihai [phone]) on 02/27/2023 6:49:52 PM - QT/QTcB on 06/09/19:  QT/QTc 406/488 ms   CV: N/A  Past Medical History:  Diagnosis Date   Adjustment disorder with anxious mood    Allergy    Anxiety    Arthritis    "hands" (09/28/2015) and knees   Depression    Diabetes mellitus without complication (HCC)    type 2, no meds, does not check blood sugar   GERD (gastroesophageal reflux disease)    Hyperlipidemia    Hypertension    Sleep apnea    age 53, uses nightly on autopap 5-15   Tobacco use disorder     Past Surgical History:  Procedure Laterality Date   APPENDECTOMY  09/15/2015   CARPAL TUNNEL RELEASE Bilateral    COLONOSCOPY  02/26/2019   INCISIONAL HERNIA REPAIR N/A 09/29/2015   Procedure: HERNIA REPAIR INCISIONAL;  Surgeon: Harriette Bouillon, MD;  Location: [facility] OR;  Service: General;  Laterality: N/A;   LAPAROSCOPIC APPENDECTOMY N/A 09/15/2015   Procedure: APPENDECTOMY LAPAROSCOPIC;  Surgeon: Jimmye Norman, MD;  Location: [facility] OR;  Service: General;  Laterality: N/A;   LUMPS ON BACK  2013   Lipomas removed   UPPER GI ENDOSCOPY  02/26/2019   WISDOM TOOTH EXTRACTION      MEDICATIONS:  amLODipine (NORVASC) 10 MG tablet   atorvastatin (LIPITOR) 40 MG tablet   bismuth subsalicylate (PEPTO BISMOL) 262 MG chewable tablet   busPIRone (BUSPAR) 15 MG tablet   cetirizine (ZYRTEC) 10 MG  tablet   DULoxetine (CYMBALTA) 60 MG capsule   FIBER GUMMIES PO   fluticasone (FLONASE) 50 MCG/ACT nasal spray   gabapentin (NEURONTIN) 300 MG capsule   hydrochlorothiazide (HYDRODIURIL) 25 MG tablet   meloxicam (MOBIC) 15 MG tablet   Misc. Devices MISC   Multiple Vitamin (MULTIVITAMIN WITH MINERALS) TABS tablet   nicotine (NICODERM CQ) 14 mg/24hr patch   Omega-3 Fatty Acids (FISH OIL OMEGA-3 PO)   omeprazole (PRILOSEC) 40 MG capsule   promethazine-dextromethorphan (PROMETHAZINE-DM) 6.25-15 MG/5ML syrup   zinc sulfate 220 (50 Zn) MG capsule   No current facility-administered medications for this encounter.    Shonna Chock, PA-C Surgical Short  Stay/Anesthesiology [facility] Phone [phone] [facility] Phone [phone] 02/28/2023 3:13 PM

## 2023-03-01 ENCOUNTER — Other Ambulatory Visit: Payer: Self-pay

## 2023-03-01 ENCOUNTER — Encounter (HOSPITAL_COMMUNITY): Payer: Self-pay | Admitting: Orthopedic Surgery

## 2023-03-01 ENCOUNTER — Ambulatory Visit (HOSPITAL_COMMUNITY)
Admission: RE | Admit: 2023-03-01 | Discharge: 2023-03-01 | Disposition: A | Discharge status: Home / Self Care | Payer: BC Managed Care – PPO | Attending: Orthopedic Surgery | Admitting: Orthopedic Surgery

## 2023-03-01 ENCOUNTER — Ambulatory Visit (HOSPITAL_COMMUNITY): Payer: BC Managed Care – PPO | Admitting: Anesthesiology

## 2023-03-01 ENCOUNTER — Encounter (HOSPITAL_COMMUNITY)
Admission: RE | Disposition: A | Discharge status: Home / Self Care | Payer: Self-pay | Source: Home / Self Care | Attending: Orthopedic Surgery

## 2023-03-01 DIAGNOSIS — E66812 Obesity, class 2: Secondary | ICD-10-CM | POA: Insufficient documentation

## 2023-03-01 DIAGNOSIS — K219 Gastro-esophageal reflux disease without esophagitis: Secondary | ICD-10-CM | POA: Insufficient documentation

## 2023-03-01 DIAGNOSIS — Z79899 Other long term (current) drug therapy: Secondary | ICD-10-CM | POA: Insufficient documentation

## 2023-03-01 DIAGNOSIS — M1990 Unspecified osteoarthritis, unspecified site: Secondary | ICD-10-CM | POA: Insufficient documentation

## 2023-03-01 DIAGNOSIS — F17210 Nicotine dependence, cigarettes, uncomplicated: Secondary | ICD-10-CM | POA: Insufficient documentation

## 2023-03-01 DIAGNOSIS — E119 Type 2 diabetes mellitus without complications: Secondary | ICD-10-CM | POA: Insufficient documentation

## 2023-03-01 DIAGNOSIS — E785 Hyperlipidemia, unspecified: Secondary | ICD-10-CM | POA: Insufficient documentation

## 2023-03-01 DIAGNOSIS — Z01818 Encounter for other preprocedural examination: Secondary | ICD-10-CM

## 2023-03-01 DIAGNOSIS — I10 Essential (primary) hypertension: Secondary | ICD-10-CM | POA: Insufficient documentation

## 2023-03-01 DIAGNOSIS — F419 Anxiety disorder, unspecified: Secondary | ICD-10-CM | POA: Insufficient documentation

## 2023-03-01 DIAGNOSIS — D1721 Benign lipomatous neoplasm of skin and subcutaneous tissue of right arm: Secondary | ICD-10-CM | POA: Diagnosis not present

## 2023-03-01 DIAGNOSIS — Z6830 Body mass index (BMI) 30.0-30.9, adult: Secondary | ICD-10-CM | POA: Insufficient documentation

## 2023-03-01 DIAGNOSIS — F32A Depression, unspecified: Secondary | ICD-10-CM | POA: Insufficient documentation

## 2023-03-01 DIAGNOSIS — G4733 Obstructive sleep apnea (adult) (pediatric): Secondary | ICD-10-CM | POA: Insufficient documentation

## 2023-03-01 HISTORY — PX: LIPOMA EXCISION: SHX5283

## 2023-03-01 LAB — GLUCOSE, CAPILLARY
Glucose-Capillary: 106 mg/dL — ABNORMAL HIGH (ref 70–99)
Glucose-Capillary: 123 mg/dL — ABNORMAL HIGH (ref 70–99)

## 2023-03-01 SURGERY — EXCISION LIPOMA
Anesthesia: General | Site: Shoulder | Laterality: Right

## 2023-03-01 MED ORDER — DEXAMETHASONE SODIUM PHOSPHATE 10 MG/ML IJ SOLN
INTRAMUSCULAR | Status: DC | PRN
  Administered 2023-03-01: 10 mg via INTRAVENOUS

## 2023-03-01 MED ORDER — LIDOCAINE 2% (20 MG/ML) 5 ML SYRINGE
INTRAMUSCULAR | Status: DC | PRN
  Administered 2023-03-01: 80 mg via INTRAVENOUS

## 2023-03-01 MED ORDER — VANCOMYCIN HCL 1000 MG IV SOLR
INTRAVENOUS | Status: DC | PRN
  Administered 2023-03-01: 1000 mg via INTRAVENOUS

## 2023-03-01 MED ORDER — FENTANYL CITRATE (PF) 250 MCG/5ML IJ SOLN
INTRAMUSCULAR | Status: AC
  Filled 2023-03-01: qty 5

## 2023-03-01 MED ORDER — FENTANYL CITRATE (PF) 100 MCG/2ML IJ SOLN
INTRAMUSCULAR | Status: AC
  Filled 2023-03-01: qty 2

## 2023-03-01 MED ORDER — CHLORHEXIDINE GLUCONATE 0.12 % MT SOLN
OROMUCOSAL | Status: AC
  Administered 2023-03-01: 15 mL via OROMUCOSAL
  Filled 2023-03-01: qty 15

## 2023-03-01 MED ORDER — PROPOFOL 10 MG/ML IV BOLUS
INTRAVENOUS | Status: DC | PRN
  Administered 2023-03-01: 160 mg via INTRAVENOUS

## 2023-03-01 MED ORDER — KETOROLAC TROMETHAMINE 30 MG/ML IJ SOLN
INTRAMUSCULAR | Status: AC
  Filled 2023-03-01: qty 1

## 2023-03-01 MED ORDER — ACETAMINOPHEN 500 MG PO TABS
1000.0000 mg | ORAL_TABLET | Freq: Once | ORAL | Status: AC
  Administered 2023-03-01: 1000 mg via ORAL
  Filled 2023-03-01: qty 2

## 2023-03-01 MED ORDER — ORAL CARE MOUTH RINSE: 15.0000 mL | Freq: Once | OROMUCOSAL | Status: AC

## 2023-03-01 MED ORDER — MORPHINE SULFATE (PF) 4 MG/ML IV SOLN
INTRAVENOUS | Status: AC
  Filled 2023-03-01: qty 2

## 2023-03-01 MED ORDER — ONDANSETRON HCL 4 MG/2ML IJ SOLN: 4.0000 mg | Freq: Once | INTRAMUSCULAR | Status: DC | PRN

## 2023-03-01 MED ORDER — FENTANYL CITRATE (PF) 100 MCG/2ML IJ SOLN
25.0000 ug | INTRAMUSCULAR | Status: DC | PRN
  Administered 2023-03-01 (×2): 50 ug via INTRAVENOUS

## 2023-03-01 MED ORDER — ROCURONIUM BROMIDE 10 MG/ML (PF) SYRINGE
PREFILLED_SYRINGE | INTRAVENOUS | Status: DC | PRN
  Administered 2023-03-01: 50 mg via INTRAVENOUS

## 2023-03-01 MED ORDER — DEXAMETHASONE SODIUM PHOSPHATE 10 MG/ML IJ SOLN
INTRAMUSCULAR | Status: AC
  Filled 2023-03-01: qty 1

## 2023-03-01 MED ORDER — BUPIVACAINE HCL (PF) 0.25 % IJ SOLN
INTRAMUSCULAR | Status: AC
  Filled 2023-03-01: qty 30

## 2023-03-01 MED ORDER — VANCOMYCIN HCL IN DEXTROSE 1-5 GM/200ML-% IV SOLN
INTRAVENOUS | Status: AC
  Filled 2023-03-01: qty 200

## 2023-03-01 MED ORDER — MIDAZOLAM HCL 2 MG/2ML IJ SOLN
INTRAMUSCULAR | Status: AC
  Filled 2023-03-01: qty 2

## 2023-03-01 MED ORDER — LIDOCAINE 2% (20 MG/ML) 5 ML SYRINGE
INTRAMUSCULAR | Status: AC
  Filled 2023-03-01: qty 5

## 2023-03-01 MED ORDER — POVIDONE-IODINE 10 % EX SWAB
2.0000 | Freq: Once | CUTANEOUS | Status: AC
  Administered 2023-03-01: 2 via TOPICAL

## 2023-03-01 MED ORDER — MORPHINE SULFATE 4 MG/ML IJ SOLN
INTRAMUSCULAR | Status: DC | PRN
  Administered 2023-03-01: 33 mL

## 2023-03-01 MED ORDER — CEFAZOLIN SODIUM-DEXTROSE 2-4 GM/100ML-% IV SOLN
2.0000 g | INTRAVENOUS | Status: DC
  Filled 2023-03-01: qty 100

## 2023-03-01 MED ORDER — SUGAMMADEX SODIUM 200 MG/2ML IV SOLN
INTRAVENOUS | Status: DC | PRN
  Administered 2023-03-01: 200 mg via INTRAVENOUS

## 2023-03-01 MED ORDER — 0.9 % SODIUM CHLORIDE (POUR BTL) OPTIME
TOPICAL | Status: DC | PRN
  Administered 2023-03-01: 1000 mL

## 2023-03-01 MED ORDER — PHENYLEPHRINE 80 MCG/ML (10ML) SYRINGE FOR IV PUSH (FOR BLOOD PRESSURE SUPPORT)
PREFILLED_SYRINGE | INTRAVENOUS | Status: DC | PRN
  Administered 2023-03-01: 80 ug via INTRAVENOUS
  Administered 2023-03-01: 160 ug via INTRAVENOUS

## 2023-03-01 MED ORDER — AMISULPRIDE (ANTIEMETIC) 5 MG/2ML IV SOLN: 10.0000 mg | Freq: Once | INTRAVENOUS | Status: DC | PRN

## 2023-03-01 MED ORDER — KETOROLAC TROMETHAMINE 15 MG/ML IJ SOLN
INTRAMUSCULAR | Status: DC | PRN
  Administered 2023-03-01: 15 mg via INTRAVENOUS

## 2023-03-01 MED ORDER — PROPOFOL 10 MG/ML IV BOLUS
INTRAVENOUS | Status: AC
  Filled 2023-03-01: qty 20

## 2023-03-01 MED ORDER — ONDANSETRON HCL 4 MG/2ML IJ SOLN
INTRAMUSCULAR | Status: AC
Start: 2023-03-01 — End: ?
  Filled 2023-03-01: qty 2

## 2023-03-01 MED ORDER — ONDANSETRON HCL 4 MG/2ML IJ SOLN
INTRAMUSCULAR | Status: DC | PRN
  Administered 2023-03-01: 4 mg via INTRAVENOUS

## 2023-03-01 MED ORDER — OXYCODONE HCL 5 MG PO TABS: 5.0000 mg | ORAL_TABLET | Freq: Four times a day (QID) | ORAL | 0 refills | Status: DC | PRN

## 2023-03-01 MED ORDER — CHLORHEXIDINE GLUCONATE 0.12 % MT SOLN: 15.0000 mL | Freq: Once | OROMUCOSAL | Status: AC

## 2023-03-01 MED ORDER — METHOCARBAMOL 500 MG PO TABS: 500.0000 mg | ORAL_TABLET | Freq: Three times a day (TID) | ORAL | 1 refills | Status: AC | PRN

## 2023-03-01 MED ORDER — POVIDONE-IODINE 7.5 % EX SOLN
Freq: Once | CUTANEOUS | Status: DC
  Filled 2023-03-01: qty 118

## 2023-03-01 MED ORDER — LACTATED RINGERS IV SOLN: INTRAVENOUS | Status: DC

## 2023-03-01 MED ORDER — MIDAZOLAM HCL 2 MG/2ML IJ SOLN
INTRAMUSCULAR | Status: DC | PRN
  Administered 2023-03-01: 2 mg via INTRAVENOUS

## 2023-03-01 MED ORDER — CLONIDINE HCL (ANALGESIA) 100 MCG/ML EP SOLN
EPIDURAL | Status: AC
  Filled 2023-03-01: qty 10

## 2023-03-01 MED ORDER — ROCURONIUM BROMIDE 10 MG/ML (PF) SYRINGE
PREFILLED_SYRINGE | INTRAVENOUS | Status: AC
  Filled 2023-03-01: qty 10

## 2023-03-01 MED ORDER — FENTANYL CITRATE (PF) 250 MCG/5ML IJ SOLN
INTRAMUSCULAR | Status: DC | PRN
  Administered 2023-03-01 (×4): 50 ug via INTRAVENOUS

## 2023-03-01 MED ORDER — INSULIN ASPART 100 UNIT/ML IJ SOLN: 0.0000 [IU] | INTRAMUSCULAR | Status: DC | PRN

## 2023-03-01 SURGICAL SUPPLY — 54 items
BAG COUNTER SPONGE SURGICOUNT (BAG) ×1 IMPLANT
BENZOIN TINCTURE PRP APPL 2/3 (GAUZE/BANDAGES/DRESSINGS) ×1 IMPLANT
BLADE CLIPPER SURG (BLADE) IMPLANT
BNDG ELASTIC 2X5.8 VLCR STR LF (GAUZE/BANDAGES/DRESSINGS) IMPLANT
BNDG ELASTIC 4X5.8 VLCR STR LF (GAUZE/BANDAGES/DRESSINGS) IMPLANT
BNDG ESMARK 4X9 LF (GAUZE/BANDAGES/DRESSINGS) IMPLANT
BNDG STRETCH GAUZE 3IN X12FT (GAUZE/BANDAGES/DRESSINGS) IMPLANT
CNTNR URN SCR LID CUP LEK RST (MISCELLANEOUS) IMPLANT
COVER SURGICAL LIGHT HANDLE (MISCELLANEOUS) ×1 IMPLANT
CUFF TOURN SGL QUICK 18X4 (TOURNIQUET CUFF) IMPLANT
CUFF TRNQT CYL 24X4X16.5-23 (TOURNIQUET CUFF) IMPLANT
DRAPE INCISE IOBAN 66X45 STRL (DRAPES) IMPLANT
DRAPE U-SHAPE 47X51 STRL (DRAPES) IMPLANT
DRSG AQUACEL AG ADV 3.5X 6 (GAUZE/BANDAGES/DRESSINGS) IMPLANT
DRSG EMULSION OIL 3X3 NADH (GAUZE/BANDAGES/DRESSINGS) IMPLANT
DURAPREP 26ML APPLICATOR (WOUND CARE) ×1 IMPLANT
ELECT REM PT RETURN 9FT ADLT (ELECTROSURGICAL) ×1
ELECTRODE REM PT RTRN 9FT ADLT (ELECTROSURGICAL) IMPLANT
GAUZE SPONGE 4X4 12PLY STRL (GAUZE/BANDAGES/DRESSINGS) IMPLANT
GAUZE XEROFORM 1X8 LF (GAUZE/BANDAGES/DRESSINGS) IMPLANT
GLOVE BIOGEL PI IND STRL 8 (GLOVE) ×1 IMPLANT
GLOVE ECLIPSE 8.0 STRL XLNG CF (GLOVE) ×1 IMPLANT
GOWN STRL REUS W/ TWL LRG LVL3 (GOWN DISPOSABLE) ×2 IMPLANT
KIT BASIN OR (CUSTOM PROCEDURE TRAY) ×1 IMPLANT
KIT TURNOVER KIT B (KITS) ×1 IMPLANT
MANIFOLD NEPTUNE II (INSTRUMENTS) ×1 IMPLANT
NDL HYPO 22X1.5 SAFETY MO (MISCELLANEOUS) IMPLANT
NDL HYPO 25GX1X1/2 BEV (NEEDLE) IMPLANT
NEEDLE HYPO 22X1.5 SAFETY MO (MISCELLANEOUS) ×1
NEEDLE HYPO 25GX1X1/2 BEV (NEEDLE) ×1
NS IRRIG 1000ML POUR BTL (IV SOLUTION) ×1 IMPLANT
PACK ORTHO EXTREMITY (CUSTOM PROCEDURE TRAY) ×1 IMPLANT
PAD ARMBOARD 7.5X6 YLW CONV (MISCELLANEOUS) ×2 IMPLANT
PENCIL BUTTON HOLSTER BLD 10FT (ELECTRODE) IMPLANT
PENCIL SMOKE EVACUATOR (MISCELLANEOUS) IMPLANT
SLING ARM FOAM STRAP XLG (SOFTGOODS) IMPLANT
SPECIMEN JAR SMALL (MISCELLANEOUS) ×1 IMPLANT
STRIP CLOSURE SKIN 1/2X4 (GAUZE/BANDAGES/DRESSINGS) ×1 IMPLANT
SUCTION TUBE FRAZIER 10FR DISP (SUCTIONS) IMPLANT
SUT ETHIBOND 4 0 TF (SUTURE) IMPLANT
SUT ETHILON 4 0 P 3 18 (SUTURE) IMPLANT
SUT MON AB 3-0 SH27 (SUTURE) IMPLANT
SUT NYLON ETHILON 5-0 P-3 1X18 (SUTURE) IMPLANT
SUT POLY ETHIBOND 5-0 P-3 1X18 (SUTURE) IMPLANT
SUT PROLENE 4 0 P 3 18 (SUTURE) IMPLANT
SUT SILK 4 0 PS 2 (SUTURE) IMPLANT
SUT VIC AB 0 CT1 27XBRD ANBCTR (SUTURE) IMPLANT
SUT VIC AB 2-0 CT2 27 (SUTURE) IMPLANT
SUT VIC AB 3-0 FS2 27 (SUTURE) IMPLANT
SYR 20ML LL LF (SYRINGE) IMPLANT
TOWEL GREEN STERILE (TOWEL DISPOSABLE) ×1 IMPLANT
TOWEL GREEN STERILE FF (TOWEL DISPOSABLE) ×1 IMPLANT
TUBE CONNECTING 12X1/4 (SUCTIONS) IMPLANT
WATER STERILE IRR 1000ML POUR (IV SOLUTION) ×1 IMPLANT

## 2023-03-01 NOTE — Transfer of Care (Signed)
Immediate Anesthesia Transfer of Care Note  Patient: MARICO GENERAL  Procedure(s) Performed: RIGHT SHOULDER LIPOMA REMOVAL (Right: Shoulder)  Patient Location: PACU  Anesthesia Type:General  Level of Consciousness: drowsy and patient cooperative  Airway & Oxygen Therapy: Patient Spontanous Breathing and Patient connected to nasal cannula oxygen  Post-op Assessment: Report given to RN and Post -op Vital signs reviewed and stable  Post vital signs: Reviewed and stable  Last Vitals:  Vitals Value Taken Time  BP 149/101 03/01/23 0851  Temp    Pulse 93 03/01/23 0854  Resp 19 03/01/23 0854  SpO2 97 % 03/01/23 0854  Vitals shown include unfiled device data.  Last Pain:  Vitals:   03/01/23 0630  TempSrc:   PainSc: 0-No pain         Complications: No notable events documented.

## 2023-03-01 NOTE — Brief Op Note (Signed)
   03/01/2023  8:35 AM  PATIENT:  Timothy Mccarthy  53 y.o. male  PRE-OPERATIVE DIAGNOSIS:  right posterior shoulder lipoma  POST-OPERATIVE DIAGNOSIS:  right posterior shoulder lipoma  PROCEDURE:  Procedure(s): RIGHT SHOULDER LIPOMA REMOVAL  SURGEON:  Surgeon(s): Cammy Copa, MD  ASSISTANT: Magnant PA  ANESTHESIA:   General  EBL: 10 ml    Total I/O In: 750 [I.V.:500; IV Piggyback:250] Out: 10 [Blood:10]  BLOOD ADMINISTERED: none  DRAINS: none   LOCAL MEDICATIONS USED: Marcaine morphine clonidine  SPECIMEN: Lipoma to pathology COUNTS:  YES  TOURNIQUET:  * No tourniquets in log *  DICTATION: .[id number]  PLAN OF CARE: Discharge to home after PACU  PATIENT DISPOSITION:  PACU - hemodynamically stable

## 2023-03-01 NOTE — Op Note (Signed)
NAME: Timothy Mccarthy, Timothy Mccarthy MEDICAL RECORD NO: [id number] ACCOUNT NO: [id number] DATE OF BIRTH: [birth year] FACILITY: [facility] LOCATION: [facility] PHYSICIAN: Graylin Shiver. August Saucer, MD  Operative Report   DATE OF PROCEDURE: 03/01/2023  PREOPERATIVE DIAGNOSIS:  Right shoulder lipoma posteriorly.  POSTOPERATIVE DIAGNOSIS:  Right shoulder lipoma posteriorly.  PROCEDURE:  Right shoulder lipoma excision.  SURGEON:  Graylin Shiver. August Saucer, MD  ASSISTANT:  Karenann Cai  INDICATIONS:  The patient is a 53 year old patient with about a 4.5 x 4.5 cm lipoma on the posterior aspect of his right shoulder.  He presents now for operative management after explanation of risks and benefits.  DESCRIPTION OF PROCEDURE:  The patient was brought to the operating room where he was placed prone on the operating room table.  Bony prominences were well padded.  The right shoulder posterior area was prescrubbed with alcohol and Betadine, allowed to  air dry, prepped with DuraPrep solution and draped in a sterile manner.  Collier Flowers was used to cover the operative field.  After calling timeout, a longitudinal incision was made, which was about 6 cm.  Skin and subcutaneous tissue were sharply divided.   Subcutaneous lipoma was present.  Plane was developed between the subdermal tissue and the fascia over the muscles posteriorly.  This plane was taken around the length of the incision on both the anterior and posterior flaps.  The lipoma was then  excised.  The lipoma after removal measured about 5 x 5 cm.  Removed en bloc.  Thorough irrigation was performed and bleeding points encountered controlled using electrocautery.  We used 2-0 Vicryl sutures to close down some of the dead space between the  fascia and the subdermal area.  Then, the incision was closed using 0 Vicryl suture, 2-0 Vicryl suture, and 3-0 Monocryl with Steri-Strips and Aquacel dressing applied.  Luke's assistance was required for retraction, opening and closing, mobilization of   tissue.  His assistance was of medical necessity.   PUS D: 03/01/2023 8:38:36 am T: 03/01/2023 8:56:00 am  JOB: [id number]/ [id number]

## 2023-03-01 NOTE — Anesthesia Procedure Notes (Signed)
Procedure Name: Intubation Date/Time: 03/01/2023 7:36 AM  Performed by: Ammie Dalton, CRNAPre-anesthesia Checklist: Patient identified, Emergency Drugs available, Suction available and Patient being monitored Patient Re-evaluated:Patient Re-evaluated prior to induction Oxygen Delivery Method: Circle System Utilized Preoxygenation: Pre-oxygenation with 100% oxygen Induction Type: IV induction Ventilation: Mask ventilation without difficulty Laryngoscope Size: Mac and 4 Grade View: Grade II Tube type: Oral Number of attempts: 1 Airway Equipment and Method: Stylet and Oral airway Placement Confirmation: ETT inserted through vocal cords under direct vision, positive ETCO2 and breath sounds checked- equal and bilateral Secured at: 23 cm Tube secured with: Tape Dental Injury: Teeth and Oropharynx as per pre-operative assessment

## 2023-03-01 NOTE — H&P (Addendum)
Timothy Mccarthy is an 53 y.o. male.   Chief Complaint: right shoulder pain HPI: Timothy Mccarthy is a 53 y.o. male who presents  reporting right posterior shoulder lipoma on the right.  He has had them removed several years ago but reports recurrence.  He states that he would like to have the 1 on the right-hand side removed.  Does have some occasional discomfort but this is more of a mechanical obstruction to sitting in the chair.  He really cannot lay on his back because of the aggravation of the mechanical feel of this mass over his scapula.  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  Has a history of cortisone injection on the left-hand side..   Past Medical History:  Diagnosis Date   Adjustment disorder with anxious mood    Allergy    Anxiety    Arthritis    "hands" (09/28/2015) and knees   Depression    Diabetes mellitus without complication (HCC)    type 2, no meds, does not check blood sugar   GERD (gastroesophageal reflux disease)    Hyperlipidemia    Hypertension    Sleep apnea    age 2, uses nightly on autopap 5-15   Tobacco use disorder     Past Surgical History:  Procedure Laterality Date   APPENDECTOMY  09/15/2015   CARPAL TUNNEL RELEASE Bilateral    COLONOSCOPY  02/26/2019   INCISIONAL HERNIA REPAIR N/A 09/29/2015   Procedure: HERNIA REPAIR INCISIONAL;  Surgeon: Harriette Bouillon, MD;  Location: [facility] OR;  Service: General;  Laterality: N/A;   LAPAROSCOPIC APPENDECTOMY N/A 09/15/2015   Procedure: APPENDECTOMY LAPAROSCOPIC;  Surgeon: Jimmye Norman, MD;  Location: [facility] OR;  Service: General;  Laterality: N/A;   LUMPS ON BACK  2013   Lipomas removed   UPPER GI ENDOSCOPY  02/26/2019   WISDOM TOOTH EXTRACTION      Family History  Problem Relation Age of Onset   Stroke Mother    Diabetes type II Mother    Diabetes Mother    Colon polyps Neg Hx    Esophageal cancer Neg Hx    Rectal cancer Neg Hx    Stomach cancer Neg Hx    Social History:  reports that he has been smoking  cigarettes. He started smoking about 35 years ago. He has a 17.9 pack-year smoking history. He has never used smokeless tobacco. He reports current alcohol use of about 6.0 - 7.0 standard drinks of alcohol per week. He reports current drug use. Drug: Marijuana.  Allergies:  Allergies  Allergen Reactions   Penicillins Anaphylaxis    Has patient had a PCN reaction causing immediate rash, facial/tongue/throat swelling, SOB or lightheadedness with hypotensionNO Has patient had a PCN reaction causing severe rash involving mucus membranes or skin necrosis: NO Has patient had a PCN reaction that required hospitalization NO Has patient had a PCN reaction occurring within the last 10 years: NO If all of the above answers are "NO", then may proceed with Cephalosporin use.    Medications Prior to Admission  Medication Sig Dispense Refill   amLODipine (NORVASC) 10 MG tablet Take 1 tablet (10 mg total) by mouth daily. 90 tablet 1   atorvastatin (LIPITOR) 40 MG tablet TAKE 1 TABLET(40 MG) BY MOUTH DAILY (Patient taking differently: Take 40 mg by mouth every evening.) 90 tablet 1   bismuth subsalicylate (PEPTO BISMOL) 262 MG chewable tablet Chew 262-524 mg by mouth as needed (stomach upset).     busPIRone (BUSPAR) 15  MG tablet Take 1 tablet (15 mg total) by mouth 2 (two) times daily. 180 tablet 1   cetirizine (ZYRTEC) 10 MG tablet TAKE 1 TABLET(10 MG) BY MOUTH DAILY 30 tablet 0   FIBER GUMMIES PO Take 1 tablet by mouth in the morning.     fluticasone (FLONASE) 50 MCG/ACT nasal spray SHAKE LIQUID AND USE 1 SPRAY IN EACH NOSTRIL DAILY 16 g 1   gabapentin (NEURONTIN) 300 MG capsule Take 1 capsule (300 mg total) by mouth 2 (two) times daily. 60 capsule 3   hydrochlorothiazide (HYDRODIURIL) 25 MG tablet TAKE 1 TABLET(25 MG) BY MOUTH DAILY 90 tablet 1   meloxicam (MOBIC) 15 MG tablet Take 1 tablet (15 mg total) by mouth daily. 90 tablet 1   Multiple Vitamin (MULTIVITAMIN WITH MINERALS) TABS tablet Take 1 tablet  by mouth in the morning. One A Day Multivitamin     Omega-3 Fatty Acids (FISH OIL OMEGA-3 PO) Take 1 capsule by mouth daily.     omeprazole (PRILOSEC) 40 MG capsule Take 1 capsule (40 mg total) by mouth daily. 90 capsule 1   promethazine-dextromethorphan (PROMETHAZINE-DM) 6.25-15 MG/5ML syrup Take 5 mLs by mouth 4 (four) times daily as needed for cough. 118 mL 0   zinc sulfate 220 (50 Zn) MG capsule Take 220 mg by mouth in the morning.     DULoxetine (CYMBALTA) 60 MG capsule Take 1 capsule (60 mg total) by mouth daily. (Patient not taking: Reported on 02/26/2023) 30 capsule 3   Misc. Devices MISC CPAP machine on autopap 5 to 15 1 each 0   nicotine (NICODERM CQ) 14 mg/24hr patch Place 1 patch (14 mg total) onto the skin daily. Then decrease to 7 mg / 24 hours (Patient not taking: Reported on 12/31/2022) 28 patch 2    Results for orders placed or performed during the hospital encounter of 03/01/23 (from the past 48 hours)  Glucose, capillary     Status: Abnormal   Collection Time: 03/01/23  6:20 AM  Result Value Ref Range   Glucose-Capillary 106 (H) 70 - 99 mg/dL    Comment: Glucose reference range applies only to samples taken after fasting for at least 8 hours.   Comment 1 Notify RN    No results found.  Review of Systems  Musculoskeletal:  Positive for arthralgias.  All other systems reviewed and are negative.   Blood pressure (!) 140/99, pulse 95, temperature 98.4 F (36.9 C), temperature source Oral, resp. rate 18, height 5\' 7"  (1.702 m), weight 87.5 kg, SpO2 95%. Physical Exam Vitals reviewed.  HENT:     Head: Normocephalic.     Nose: Nose normal.     Mouth/Throat:     Mouth: Mucous membranes are moist.  Eyes:     Pupils: Pupils are equal, round, and reactive to light.  Cardiovascular:     Rate and Rhythm: Normal rate.     Pulses: Normal pulses.  Pulmonary:     Effort: Pulmonary effort is normal.  Abdominal:     General: Abdomen is flat.  Musculoskeletal:     Cervical  back: Normal range of motion.  Skin:    General: Skin is warm.     Capillary Refill: Capillary refill takes less than 2 seconds.  Neurological:     General: No focal deficit present.     Mental Status: He is alert.  Psychiatric:        Mood and Affect: Mood normal.    Ortho exam demonstrates full active and passive  range of motion of both shoulders. Has well-healed surgical incision over the right shoulder over the scapular spine which is about 3 to 4 cm. About 4 cm distal and lateral to this incision is a 4 x 4 centimeter lipoma which is nontender to touch and is freely mobile beneath the skin. There is no lymphadenopathy in that shoulder girdle region. Rotator cuff strength is intact bilateral shoulders with no coarse grinding or crepitus with internal/external rotation.   Assessment/Plan mpression is recurrent 4 x 4 centimeter lipoma which appears to be subcutaneous and does not have concerning features on ultrasound examination. This is about 2 to 3 cm away from the prior incision over the right posterior shoulder near the scapular spine. The mass itself is nontender. Excision can be performed with the patient in the prone position. The risk and benefits are discussed with the patient include not limited to infection or vessel damage incomplete pain relief as well as potential which is very small that this could be more than just a benign lipoma. Interscalene block not needed - will use local at end of case. All questions answered.   Burnard Bunting, MD 03/01/2023, 6:30 AM

## 2023-03-03 NOTE — Anesthesia Postprocedure Evaluation (Signed)
Anesthesia Post Note  Patient: Timothy Mccarthy  Procedure(s) Performed: RIGHT SHOULDER LIPOMA REMOVAL (Right: Shoulder)     Patient location during evaluation: PACU Anesthesia Type: General Level of consciousness: awake and alert Pain management: pain level controlled Vital Signs Assessment: post-procedure vital signs reviewed and stable Respiratory status: spontaneous breathing, nonlabored ventilation and respiratory function stable Cardiovascular status: blood pressure returned to baseline and stable Postop Assessment: no apparent nausea or vomiting Anesthetic complications: no   No notable events documented.  Last Vitals:  Vitals:   03/01/23 0930 03/01/23 0940  BP: (!) 117/106 (!) 130/94  Pulse: 86 87  Resp: 16 17  Temp:  36.4 C  SpO2: 97% 91%    Last Pain:  Vitals:   03/01/23 0630  TempSrc:   PainSc: 0-No pain                 Collene Schlichter

## 2023-03-04 ENCOUNTER — Encounter (HOSPITAL_COMMUNITY): Payer: Self-pay | Admitting: Orthopedic Surgery

## 2023-03-04 LAB — SURGICAL PATHOLOGY

## 2023-03-05 ENCOUNTER — Encounter: Payer: Self-pay | Admitting: Physical Therapy

## 2023-03-07 ENCOUNTER — Encounter: Payer: Self-pay | Admitting: Physical Therapy

## 2023-03-11 ENCOUNTER — Ambulatory Visit (INDEPENDENT_AMBULATORY_CARE_PROVIDER_SITE_OTHER): Payer: BC Managed Care – PPO | Admitting: Surgical

## 2023-03-11 ENCOUNTER — Encounter: Payer: Self-pay | Admitting: Surgical

## 2023-03-11 DIAGNOSIS — D179 Benign lipomatous neoplasm, unspecified: Secondary | ICD-10-CM

## 2023-03-11 MED ORDER — OXYCODONE HCL 5 MG PO TABS: 5.0000 mg | ORAL_TABLET | Freq: Two times a day (BID) | ORAL | 0 refills | Status: DC | PRN

## 2023-03-11 NOTE — Progress Notes (Signed)
Post-Op Visit Note   Patient: Timothy Mccarthy           Date of Birth: Jul 05, 1970           MRN: [id number] Visit Date: 03/11/2023 PCP: Hoy Register, MD   Assessment & Plan:  Chief Complaint:  Chief Complaint  Patient presents with   Right Shoulder - Routine Post Op    RIGHT SHOULDER LIPOMA (surgery date 03-01-23)   Visit Diagnoses:  1. Lipoma, unspecified site     Plan: Patient is a 53 year old male who presents s/p right shoulder lipoma excision on 03/01/2023.  Doing well for the most part but does have a fair amount of pain and soreness mostly localizing to the surgical site and the rib cage.  Has some tingling primarily around the incision site.  Has been taking oxycodone but he has run out.  No fevers or chills.  Has had some night sweats intermittently.  No chest pain or shortness of breath.  He is able to take a full deep breath without difficulty.  On exam, patient has intact EPL, FPL, finger abduction.  Shoulder external rotation to about 45 degrees with abduction to 90 degrees and forward elevation to 90 degrees.  This is mostly limited due to pain but not really stiffness of the shoulder.  Incision looks to be healing well without evidence of infection or dehiscence.  This was redressed with Steri-Strips.  Plan is refill oxycodone.  Return to work on Wednesday.  No lifting more than 5 pounds.  Caution against using sling in order to prevent development of frozen shoulder.  Follow-up in 3 weeks for clinical recheck.  Pathology results did show a lipoma without malignancy.  Follow-Up Instructions: No follow-ups on file.   Orders:  No orders of the defined types were placed in this encounter.  Meds ordered this encounter  Medications   oxyCODONE (ROXICODONE) 5 MG immediate release tablet    Sig: Take 1 tablet (5 mg total) by mouth every 12 (twelve) hours as needed for severe pain (pain score 7-10).    Dispense:  20 tablet    Refill:  0    Imaging: No results  found.  PMFS History: Patient Active Problem List   Diagnosis Date Noted   Diabetes mellitus (HCC) 06/26/2022   Chronic pain of both knees 11/26/2017   Hyperlipidemia 12/25/2016   Anxiety 06/06/2016   Essential hypertension 11/04/2015   Abdominal hernia 09/28/2015   Appendicitis 09/15/2015   Tobacco use disorder 04/21/2015   Adjustment disorder with anxious mood 04/21/2015   Health care maintenance 04/21/2015   GERD (gastroesophageal reflux disease) 04/21/2015   Past Medical History:  Diagnosis Date   Adjustment disorder with anxious mood    Allergy    Anxiety    Arthritis    "hands" (09/28/2015) and knees   Depression    Diabetes mellitus without complication (HCC)    type 2, no meds, does not check blood sugar   GERD (gastroesophageal reflux disease)    Hyperlipidemia    Hypertension    Sleep apnea    age 57, uses nightly on autopap 5-15   Tobacco use disorder     Family History  Problem Relation Age of Onset   Stroke Mother    Diabetes type II Mother    Diabetes Mother    Colon polyps Neg Hx    Esophageal cancer Neg Hx    Rectal cancer Neg Hx    Stomach cancer Neg Hx  Past Surgical History:  Procedure Laterality Date   APPENDECTOMY  09/15/2015   CARPAL TUNNEL RELEASE Bilateral    COLONOSCOPY  02/26/2019   INCISIONAL HERNIA REPAIR N/A 09/29/2015   Procedure: HERNIA REPAIR INCISIONAL;  Surgeon: Harriette Bouillon, MD;  Location: [facility] OR;  Service: General;  Laterality: N/A;   LAPAROSCOPIC APPENDECTOMY N/A 09/15/2015   Procedure: APPENDECTOMY LAPAROSCOPIC;  Surgeon: Jimmye Norman, MD;  Location: [facility] OR;  Service: General;  Laterality: N/A;   LIPOMA EXCISION Right 03/01/2023   Procedure: RIGHT SHOULDER LIPOMA REMOVAL;  Surgeon: Cammy Copa, MD;  Location: [facility] OR;  Service: Orthopedics;  Laterality: Right;   LUMPS ON BACK  2013   Lipomas removed   UPPER GI ENDOSCOPY  02/26/2019   WISDOM TOOTH EXTRACTION     Social History   Occupational History   Not on  file  Tobacco Use   Smoking status: Every Day    Current packs/day: 0.50    Average packs/day: 0.5 packs/day for 35.9 years (17.9 ttl pk-yrs)    Types: Cigarettes    Start date: 04/21/1987   Smokeless tobacco: Never   Tobacco comments:    Patches RX but patient does not use patches as pf 02/27/23.  Vaping Use   Vaping status: Never Used  Substance and Sexual Activity   Alcohol use: Yes    Alcohol/week: 6.0 - 7.0 standard drinks of alcohol    Types: 6 - 7 Shots of liquor per week    Comment: "takes shots whenever has pain", 1/night   Drug use: Yes    Types: Marijuana    Comment: Last use was on 02/26/23, informed pt to withhold 24-48 hrs prior to procedure   Sexual activity: Yes

## 2023-03-12 ENCOUNTER — Encounter: Payer: Self-pay | Admitting: Physical Therapy

## 2023-03-13 ENCOUNTER — Encounter: Payer: Self-pay | Admitting: Family Medicine

## 2023-03-14 ENCOUNTER — Telehealth: Payer: Self-pay | Admitting: Physical Therapy

## 2023-03-14 ENCOUNTER — Ambulatory Visit: Payer: BC Managed Care – PPO | Admitting: Physical Therapy

## 2023-03-14 ENCOUNTER — Other Ambulatory Visit: Payer: Self-pay | Admitting: Surgical

## 2023-03-14 DIAGNOSIS — D1721 Benign lipomatous neoplasm of skin and subcutaneous tissue of right arm: Secondary | ICD-10-CM

## 2023-03-14 MED ORDER — OXYCODONE HCL 5 MG PO TABS: 5.0000 mg | ORAL_TABLET | Freq: Two times a day (BID) | ORAL | 0 refills | Status: DC | PRN

## 2023-03-14 NOTE — Telephone Encounter (Signed)
Pt  called after missing appt today and reports he will cancel further PT due to doing will with back exercises before recent lipoma removal surgery.  Pt was in agreement with DC today. Garen Lah, PT, [facility] Certified Exercise Expert for the Aging Adult  03/14/23 1:51 PM Phone: [phone] Fax: [phone]

## 2023-03-14 NOTE — Therapy (Deleted)
 OUTPATIENT PHYSICAL THERAPY THORACOLUMBAR TREATMENT    Patient Name: Timothy Mccarthy MRN: [id number] DOB:March 08, 1970, 53 y.o., male Today's Date: 03/14/2023  END OF SESSION:    Past Medical History:  Diagnosis Date   Adjustment disorder with anxious mood    Allergy    Anxiety    Arthritis    "hands" (09/28/2015) and knees   Depression    Diabetes mellitus without complication (HCC)    type 2, no meds, does not check blood sugar   GERD (gastroesophageal reflux disease)    Hyperlipidemia    Hypertension    Sleep apnea    age 24, uses nightly on autopap 5-15   Tobacco use disorder    Past Surgical History:  Procedure Laterality Date   APPENDECTOMY  09/15/2015   CARPAL TUNNEL RELEASE Bilateral    COLONOSCOPY  02/26/2019   INCISIONAL HERNIA REPAIR N/A 09/29/2015   Procedure: HERNIA REPAIR INCISIONAL;  Surgeon: Harriette Bouillon, MD;  Location: [facility] OR;  Service: General;  Laterality: N/A;   LAPAROSCOPIC APPENDECTOMY N/A 09/15/2015   Procedure: APPENDECTOMY LAPAROSCOPIC;  Surgeon: Jimmye Norman, MD;  Location: [facility] OR;  Service: General;  Laterality: N/A;   LIPOMA EXCISION Right 03/01/2023   Procedure: RIGHT SHOULDER LIPOMA REMOVAL;  Surgeon: Cammy Copa, MD;  Location: [facility] OR;  Service: Orthopedics;  Laterality: Right;   LUMPS ON BACK  2013   Lipomas removed   UPPER GI ENDOSCOPY  02/26/2019   WISDOM TOOTH EXTRACTION     Patient Active Problem List   Diagnosis Date Noted   Diabetes mellitus (HCC) 06/26/2022   Chronic pain of both knees 11/26/2017   Hyperlipidemia 12/25/2016   Anxiety 06/06/2016   Essential hypertension 11/04/2015   Abdominal hernia 09/28/2015   Appendicitis 09/15/2015   Tobacco use disorder 04/21/2015   Adjustment disorder with anxious mood 04/21/2015   Health care maintenance 04/21/2015   GERD (gastroesophageal reflux disease) 04/21/2015    PCP: Hoy Register, MD  REFERRING PROVIDER: Hoy Register, MD  REFERRING DIAG: [phone] (ICD-10-CM)  - Chronic left-sided low back pain with left-sided sciatica M25.561,M25.562 (ICD-10-CM) - Arthralgia of both knees  Rationale for Evaluation and Treatment: Rehabilitation  THERAPY DIAG:  No diagnosis found.  ONSET DATE: several years for back and knees  SUBJECTIVE:                                                                                                                                                                                           SUBJECTIVE STATEMENT: 03-14-23 Pt reports back and knees are doing okay. No pain on arrival.  He is having a Shoulder Lipoma removed tomorrow and will have  a 2 week absence.   EVAL: Pt endorses initial back injury when he was 20 and fell off a roof, has had issues since that slowly seem to be worsening as time goes on. He also states he was very active with sports when he was younger and has had a demanding career working with maintenance. He endorses gradual onset of knee pain for several years that also seem to worsening. He endorses difficulty sleeping due to pain (waking 4-5x/night on avg), difficulty with household tasks, self care, and work tasks. Occasionally has LLE referral when lying on L side or when walking for a long time. Does have some BIL foot numbness at times which he states has been ongoing for past couple of years. He does endorse getting PT for these issues in the past and got some relief, but had to stop due to finances. States symptoms feel the same in quality but are slowly worsening with time. He denies bowel/bladder issues, saddle anesthesia or sensory changes, fevers/chills.     PERTINENT HISTORY:  anxiety/depression, HTN, GERD  PAIN:  Are you having pain: 0/10 back, 0/10 knees  Location/description: L low back and hip, BIL knees Best-worst over past week: Back 4-10/10,  0-10/10 - aggravating factors: bending forward (particularly prolonged), transfers, bed transfers, lifting (case of water), ADLs - Easing factors:  heat, hot showers/bath, brace for knees  PRECAUTIONS: none   WEIGHT BEARING RESTRICTIONS: No  FALLS:  Has patient fallen in last 6 months? No  LIVING ENVIRONMENT: Lives in apartment, reports lots of stair navigation for work. Lives w/ spouse on first floor. Spouse does most of housework per pt report Has a lot of grandchildren  OCCUPATION: Teaching laboratory technician, has to do a lot of stairs. Avoiding heavy lifting.   PLOF: Independent  PATIENT GOALS: wants to get his back better, be able to interact more with grandbabies  NEXT MD VISIT: ~5 months from eval   OBJECTIVE:  Note: Objective measures were completed at Evaluation unless otherwise noted.  DIAGNOSTIC FINDINGS:  L shoulder XR ordered, not yet taken per review of EPIC Had lumbar/sacral XR and BIL knee XR in 2023, refer to EPIC for details  PATIENT SURVEYS:  FOTO deferred on eval FOTO 02/19/23 (2nd visit) Lumbar 49%, Knee 43%  FOTO 02/28/2567% back 72% knee  SCREENING FOR RED FLAGS: Does report some chronic BIL foot numbness at times over past two years, no recent changes. No bowel/bladder changes, no fevers/chills, no saddle anesthesia or sensory issues endorsed.   COGNITION: Overall cognitive status: Within functional limits for tasks assessed     SENSATION/NEURO: Light touch testing confounded by BIL knee brace and boots No clonus either LE Negative hoffmann and tromner sign BIL No ataxia with gait   POSTURE: rounded shoulders, forward head  LUMBAR ROM:   AROM eval 02/26/23  Flexion Proximal shin * Touches Toes   Extension 100% *   Right lateral flexion    Left lateral flexion    Right rotation 25% s* 75% s*  Left rotation 25% s* 75%  s*   (Blank rows = not tested) (Key: WFL = within functional limits not formally assessed, * = concordant pain, s = stiffness/stretching sensation, NT = not tested)   RANGE OF MOTION:     ROM Right eval Left eval Right 02/26/23 Left 02/26/23  Shoulder flexion       Shoulder abduction      Functional ER combo      Functional IR combo  Knee extension Full but painful Full but painful    Knee flexion A: 110 deg * A: 112 deg * 130 130  Ankle dorsiflexion       (Blank rows = not tested) (Key: WFL = within functional limits not formally assessed, * = concordant pain, s = stiffness/stretching sensation, NT = not tested)  Comments:    STRENGTH TESTING:  MMT Right eval Left eval Right 02/28/23 Right  02/28/23  Shoulder flexion      Shoulder abduction      Shoulder ER      Shoulder IR      Elbow flexion      Elbow extension      Grip strength (gross)      Hip flexion 4 * 4- * 4+ 4+  Hip abduction (modified sitting) 4 4    Knee flexion 4 4- * 5 5  Knee extension 4+ 4+ 5 5  Ankle dorsiflexion 5 5    Ankle plantarflexion       (Blank rows = not tested) (Key: WFL = within functional limits not formally assessed, * = concordant pain, s = stiffness/stretching sensation, NT = not tested)  Comments:    LUMBAR SPECIAL TESTS:  Slump test: R negative ; L negative  SLR confounded by knee pain with extension BIL  FUNCTIONAL TESTS:  5xSTS: 20.43sec w UE support from thighs, knee>back pain 02/26/23: 14.8 sec without UE , no increased pain   02/28/23: 10 sec without UE   GAIT: Distance walked: within clinic Assistive device utilized: None Level of assistance: Complete Independence Comments: reduced gait speed/cadence, reduced truncal rotation  TODAY'S TREATMENT:         [facility] Adult PT Treatment:                                                DATE: 03-14-23 Therapeutic Exercise: Nustep L6 UE/LE x 5 min Knee ext 10# bilat 10 x 2  Knee flexion 25# bilat 10 x 2  Double GTB palloff press 10 x 2 each  Freemotion pull dowm bar 20# x 10, 23#  x10  Freemotion Row with bar 27# 10 x 2  Bridge x 10 each  Single leg bridge x 10 each SLR 10 x 2 each with ab brace  Manual Therapy: *** Neuromuscular re-ed: *** Therapeutic  Activity: *** Modalities: *** Self Care: ***                                                                                                                       [facility] Adult PT Treatment:                                                DATE:  02/28/23 Therapeutic Exercise: Nustep L6 UE/LE x 5 min Knee ext 10# bilat 10 x 2  Knee flexion 25# bilat 10 x 2  Double GTB palloff press 10 x 2 each  Freemotion pull dowm bar 20# x 10, 23#  x10  Freemotion Row with bar 27# 10 x 2  Bridge x 10 each  Single leg bridge x 10 each SLR 10 x 2 each with ab brace   Therapeutic Activity: FOTO Met for both  back and knees  Goal check and discussion on progress    [facility] Adult PT Treatment:                                                DATE: 02/26/23 Therapeutic Exercise: Nustep L5 UE/LE x 5 min Seated LAQ 3# 10 x 2  Seated  March 3# 10 x 2  STS x 10  Palloff press 1 GTB 10 x 2 each direction SLR with ab brace 10 x 2 each S/L clam AROM x 10 each , with red band x 10 each  Bridge 10 x 2  Single leg bridge 2 x 10 each  H/s curls with feet on ball 10 x 2 - added UE touch to knees 2 nd set  Seated Lumbar flexion 10 sec x 5  Lumbar AROM, Knee AROM     [facility] Adult PT Treatment:                                                DATE: 02/21/23 Therapeutic Exercise: Nustep L 5 UE/LE x 5 minutes  Seated LAQ 2# 10 x 2  Seated March 2# 10 x 2  Seated A <-> P pelvic tilts 10 x 2  Supine PPT 5 sec 10 x 2  SLR with ab brace 10 x 2 each Blue band supine clam 10 x 3 Bridge with feet over ball 10 x 2  H/s curls with feet on ball 10 x 2  SKTC LTR     PATIENT EDUCATION:  Education details: Pt education on PT impairments, prognosis, and POC. Informed consent. Rationale for interventions, safe/appropriate HEP performance Person educated: Patient Education method: Explanation, Demonstration, Tactile cues, Verbal cues, and Handouts Education comprehension: verbalized understanding, returned demonstration,  verbal cues required, tactile cues required, and needs further education    HOME EXERCISE PROGRAM: Access Code: [email or web address] URL: [email or web address] Date: 01/23/2023 Prepared by: Fransisco Hertz  Exercises - Seated Thoracic Lumbar Extension  - 2-3 x daily - 1 sets - 8 reps - Seated Long Arc Quad  - 2-3 x daily - 1 sets - 8 reps - Seated March  - 2-3 x daily - 1 sets - 8 reps - Supine pelvic tilit  - 1 x daily - 7 x weekly - 2 sets - 10 reps - Hooklying Clamshell with Resistance  - 2 x daily - 7 x weekly - 3 sets - 10 reps - 5 hold - Supine Lower Trunk Rotation  - 1 x daily - 7 x weekly - 1 sets - 10 reps - Active straight leg raise  - 1 x daily - 7 x weekly - 2 sets - 10 reps  ASSESSMENT:  CLINICAL IMPRESSION: Pt arrives reporting that he felt good after  last session, no pain on arrival in back and knees. Does have surgery scheduled for tomorrow to remove Lipoma on back of shoulder. Will work from home for 2 weeks and see MD prior to resuming this course of PT. He reports overall improvement, noting decreased pain at night time. LTG# 6 met. Also, both FOTO score status exceeded goal for both back and Knee. LTG# 1 met. Overall pt demonstrates excellent progress and has MET most LTGs. He would like to return in 2 weeks after his post op recovery period to resume PT in order to transition to safe gym routine and finalize HEP.     EVAL: Patient is a pleasant 53 y.o. gentleman who was seen today for physical therapy evaluation and treatment for back and BIL knee pain ongoing for several years. Red flag questioning reassuring, most recent imaging XR in 2023 (refer to EPIC for details). Pt endorses difficulty w/ majority of daily/work tasks due to pain, most difficulty with prolonged standing/walking, lifting, and sleeping. On exam he demonstrates limitations in lumbar mobility w/ flexion being most provocative, and LE weakness that is more prominent on L (which he describes as most  affected from both knee and back perspective). Slump testing unremarkable, 5xSTS is indicative of fall risk and reduced functional mobility. Does well with HEP, endorses improved symptoms afterwards. No adverse events. Recommend trial of skilled PT to address aforementioned deficits with aim of improving functional tolerance and reducing pain with typical activities. Pt departs today's session in no acute distress, all voiced concerns/questions addressed appropriately from PT perspective.      OBJECTIVE IMPAIRMENTS: Abnormal gait, decreased activity tolerance, decreased endurance, decreased mobility, difficulty walking, decreased ROM, decreased strength, hypomobility, improper body mechanics, postural dysfunction, and pain.   ACTIVITY LIMITATIONS: carrying, lifting, bending, standing, squatting, sleeping, stairs, transfers, bed mobility, and locomotion level  PARTICIPATION LIMITATIONS: meal prep, cleaning, laundry, community activity, and occupation  PERSONAL FACTORS: Time since onset of injury/illness/exacerbation and 1-2 comorbidities: anxiety/depression, HTN  are also affecting patient's functional outcome.   REHAB POTENTIAL: Good  CLINICAL DECISION MAKING: Stable/uncomplicated  EVALUATION COMPLEXITY: Low   GOALS: Goals reviewed with patient? No - did discuss PT POC and role of PT  SHORT TERM GOALS: Target date: 02/20/2023 Pt will demonstrate appropriate understanding and performance of initially prescribed HEP in order to facilitate improved independence with management of symptoms.  Baseline: HEP provided on eval 02/19/23: reprinted today  Goal status: MET  2. Pt will score increase FOTO score by at least MCID in order to demonstrate improved perception of function due to symptoms.  Baseline: FOTO TBD  02/19/23: see knee and lumbar FOTO scores   Goal status: INITIAL    LONG TERM GOALS: Target date: 03/20/2023 Pt will meet predicted score on FOTO in order to demonstrate improved  perception of functional status due to symptoms.  Baseline: FOTO TBD 02/19/23: see baselines Goal status: MET  2.  Pt will demonstrate lumbar flexion AROM to at least distal shin in order to demonstrate improved tolerance to ADLS such as lower body dressing. Baseline: proximal shin with pain 02/26/23: touches toes Goal status: MET  3.  Pt will demonstrate hip/knee MMT of at least 4+/5 bilaterally in order to demonstrate improved strength for functional movements.  Baseline: see MMT chart above Goal status: INITIAL  4. Pt will perform 5xSTS in <12 sec in order to demonstrate reduced fall risk and improved functional independence. (MCID of 2.3sec)  Baseline: 20sec w/ UE support and knee/back pain  02/28/23:  10 sec without UE support   Goal status: INITIAL   5. Pt will report at least 50% decrease in overall pain levels in past week in order to facilitate improved tolerance to basic ADLs/mobility.   Baseline: 4-10/10 back, 0-10/10 knees  02/28/23: pain related to activity, not as constant  Goal status: ONGOING  6. Pt will endorse waking no more than 2x/night on average over past week due to knee/back pain in order to facilitate improved overall health and QOL.  Baseline: 4-5x/night on average per pt report  02/28/23: less, sometimes does not wake due to pain at all.   Goals status: MET  PLAN:  PT FREQUENCY: 2x/week  PT DURATION: 8 weeks  PLANNED INTERVENTIONS: 97164- PT Re-evaluation, 97110-Therapeutic exercises, 97530- Therapeutic activity, 97112- Neuromuscular re-education, 97535- Self Care, [id number]- Manual therapy, [phone]- Gait training, [phone]- Aquatic Therapy, Patient/Family education, Balance training, Stair training, Taping, Joint mobilization, Spinal mobilization, Cryotherapy, and Moist heat.  PLAN FOR NEXT SESSION:  Pt will be out for 2 weeks with lipoma removal. Review and progress as tolerated when he returns. Gym machines and general wellness exercise for knees and back. 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

## 2023-03-19 ENCOUNTER — Ambulatory Visit: Payer: Self-pay | Admitting: Physical Therapy

## 2023-03-23 ENCOUNTER — Other Ambulatory Visit: Payer: Self-pay | Admitting: Family Medicine

## 2023-03-23 DIAGNOSIS — J3089 Other allergic rhinitis: Secondary | ICD-10-CM

## 2023-03-25 NOTE — Telephone Encounter (Signed)
 Requested Prescriptions  Pending Prescriptions Disp Refills   fluticasone  (FLONASE ) 50 MCG/ACT nasal spray [Pharmacy Med Name: FLUTICASONE  NASAL SP (120) RX] 16 g 1    Sig: SHAKE LIQUID AND USE 1 SPRAY IN EACH NOSTRIL DAILY     Ear, Nose, and Throat: Nasal Preparations - Corticosteroids Passed - 03/25/2023 11:12 AM      Passed - Valid encounter within last 12 months    Recent Outpatient Visits           2 months ago Type 2 diabetes mellitus with other specified complication, without long-term current use of insulin  (HCC)   [facility] [place] Joaquin Mulberry, MD   9 months ago Type 2 diabetes mellitus with other specified complication, without long-term current use of insulin  [facility])   [facility] Of [place]. [facility] Joaquin Mulberry, MD   1 year ago Type 2 diabetes mellitus with other specified complication, without long-term current use of insulin  [facility])   [facility] [facility] Of [place]. [facility] Joaquin Mulberry, MD   1 year ago Type 2 diabetes mellitus with other specified complication, without long-term current use of insulin  [facility])   [facility] [facility] Of [place]. [facility] Joaquin Mulberry, MD   2 years ago Essential hypertension   [facility] Of [place]. [facility] Joaquin Mulberry, MD       Future Appointments             In 3 months Joaquin Mulberry, MD [facility] - [facility] Dept Of [place]. [facility]

## 2023-04-01 ENCOUNTER — Ambulatory Visit (INDEPENDENT_AMBULATORY_CARE_PROVIDER_SITE_OTHER): Payer: BC Managed Care – PPO | Admitting: Surgical

## 2023-04-01 ENCOUNTER — Encounter: Payer: Self-pay | Admitting: Surgical

## 2023-04-01 DIAGNOSIS — D179 Benign lipomatous neoplasm, unspecified: Secondary | ICD-10-CM

## 2023-04-01 NOTE — Progress Notes (Signed)
Post-Op Visit Note   Patient: Timothy Mccarthy           Date of Birth: 02-14-1970           MRN: [id number] Visit Date: 04/01/2023 PCP: Hoy Register, MD   Assessment & Plan:  Chief Complaint:  Chief Complaint  Patient presents with   Right Shoulder - Follow-up    RIGHT SHOULDER LIPOMA REMOVAL 03/01/2023   Visit Diagnoses:  1. Lipoma, unspecified site     Plan: Patient is a 53 year old male who presents s/p right shoulder lipoma excision on 03/01/2023.  He is overall doing better than he was at the last visit.  Taking occasional oxycodone 5 mg.  He has returned to work where he is not lifting more than 5 pounds and mostly just doing desk work.  Does continue to complain about sensitivity around the incision and cannot really put pressure on his back when he tries to sleep.  He mostly sleeps on his left side.  Denies any fevers or chills or drainage.  On exam, patient has intact EPL, FPL, finger abduction, grip strength testing.  Intact rotator cuff strength of supra, infra, subscap.  Axillary nerve intact with deltoid firing.  Does have some hypersensitivity on the superior side of the incision but not so much the inferior side.  Able to shrug his shoulder.  He has increased passive motion of the operative shoulder compared with the last visit with 45 degrees X rotation, 110 degrees abduction, 150 degrees forward elevation passively with a lot better tolerance to this motion compared with last visit.  Incision looks to be healing well with small area of eschar in the midportion of the incision.  There is small amount of suture material that was debrided.  No significant purulence.  Plan is continue with range of motion as tolerated for the right shoulder.  Continue with desk work and okay for light lifting with the right arm but with his continued pain in the back musculature and sensitivity around the incision, think that Kyley needs to give this a little bit more time before he goes  back to full duty.  We will see him back in 4 weeks for clinical recheck with Dr. August Saucer and consideration of return to full work at that time.  Follow-Up Instructions: No follow-ups on file.   Orders:  No orders of the defined types were placed in this encounter.  No orders of the defined types were placed in this encounter.   Imaging: No results found.  PMFS History: Patient Active Problem List   Diagnosis Date Noted   Lipoma of right shoulder 03/14/2023   Diabetes mellitus (HCC) 06/26/2022   Chronic pain of both knees 11/26/2017   Hyperlipidemia 12/25/2016   Anxiety 06/06/2016   Essential hypertension 11/04/2015   Abdominal hernia 09/28/2015   Appendicitis 09/15/2015   Tobacco use disorder 04/21/2015   Adjustment disorder with anxious mood 04/21/2015   Health care maintenance 04/21/2015   GERD (gastroesophageal reflux disease) 04/21/2015   Past Medical History:  Diagnosis Date   Adjustment disorder with anxious mood    Allergy    Anxiety    Arthritis    "hands" (09/28/2015) and knees   Depression    Diabetes mellitus without complication (HCC)    type 2, no meds, does not check blood sugar   GERD (gastroesophageal reflux disease)    Hyperlipidemia    Hypertension    Sleep apnea    age 45, uses nightly on autopap  5-15   Tobacco use disorder     Family History  Problem Relation Age of Onset   Stroke Mother    Diabetes type II Mother    Diabetes Mother    Colon polyps Neg Hx    Esophageal cancer Neg Hx    Rectal cancer Neg Hx    Stomach cancer Neg Hx     Past Surgical History:  Procedure Laterality Date   APPENDECTOMY  09/15/2015   CARPAL TUNNEL RELEASE Bilateral    COLONOSCOPY  02/26/2019   INCISIONAL HERNIA REPAIR N/A 09/29/2015   Procedure: HERNIA REPAIR INCISIONAL;  Surgeon: Harriette Bouillon, MD;  Location: [facility] OR;  Service: General;  Laterality: N/A;   LAPAROSCOPIC APPENDECTOMY N/A 09/15/2015   Procedure: APPENDECTOMY LAPAROSCOPIC;  Surgeon: Jimmye Norman,  MD;  Location: [facility] OR;  Service: General;  Laterality: N/A;   LIPOMA EXCISION Right 03/01/2023   Procedure: RIGHT SHOULDER LIPOMA REMOVAL;  Surgeon: Cammy Copa, MD;  Location: [facility] OR;  Service: Orthopedics;  Laterality: Right;   LUMPS ON BACK  2013   Lipomas removed   UPPER GI ENDOSCOPY  02/26/2019   WISDOM TOOTH EXTRACTION     Social History   Occupational History   Not on file  Tobacco Use   Smoking status: Every Day    Current packs/day: 0.50    Average packs/day: 0.5 packs/day for 35.9 years (18.0 ttl pk-yrs)    Types: Cigarettes    Start date: 04/21/1987   Smokeless tobacco: Never   Tobacco comments:    Patches RX but patient does not use patches as pf 02/27/23.  Vaping Use   Vaping status: Never Used  Substance and Sexual Activity   Alcohol use: Yes    Alcohol/week: 6.0 - 7.0 standard drinks of alcohol    Types: 6 - 7 Shots of liquor per week    Comment: "takes shots whenever has pain", 1/night   Drug use: Yes    Types: Marijuana    Comment: Last use was on 02/26/23, informed pt to withhold 24-48 hrs prior to procedure   Sexual activity: Yes

## 2023-04-04 ENCOUNTER — Encounter: Payer: Self-pay | Admitting: Family Medicine

## 2023-04-04 ENCOUNTER — Other Ambulatory Visit: Payer: Self-pay | Admitting: Family Medicine

## 2023-04-04 MED ORDER — CLINDAMYCIN HCL 300 MG PO CAPS: 300.0000 mg | ORAL_CAPSULE | Freq: Two times a day (BID) | ORAL | 0 refills | Status: DC

## 2023-04-05 ENCOUNTER — Other Ambulatory Visit: Payer: Self-pay | Admitting: Family Medicine

## 2023-04-05 DIAGNOSIS — J3089 Other allergic rhinitis: Secondary | ICD-10-CM

## 2023-04-23 ENCOUNTER — Encounter: Payer: Self-pay | Admitting: Family Medicine

## 2023-04-25 ENCOUNTER — Other Ambulatory Visit: Payer: Self-pay | Admitting: Family Medicine

## 2023-04-25 MED ORDER — ONETOUCH ULTRA 2 W/DEVICE KIT: 1.0000 | PACK | Freq: Every day | 0 refills | Status: AC

## 2023-04-25 MED ORDER — ONETOUCH ULTRA BLUE TEST VI STRP: ORAL_STRIP | 12 refills | Status: AC

## 2023-04-25 MED ORDER — ONETOUCH ULTRASOFT LANCETS MISC: 12 refills | Status: AC

## 2023-04-29 ENCOUNTER — Ambulatory Visit (INDEPENDENT_AMBULATORY_CARE_PROVIDER_SITE_OTHER): Payer: BC Managed Care – PPO | Admitting: Surgical

## 2023-04-29 ENCOUNTER — Encounter: Payer: Self-pay | Admitting: Surgical

## 2023-04-29 DIAGNOSIS — M25569 Pain in unspecified knee: Secondary | ICD-10-CM

## 2023-04-29 DIAGNOSIS — M25511 Pain in right shoulder: Secondary | ICD-10-CM

## 2023-04-29 DIAGNOSIS — D179 Benign lipomatous neoplasm, unspecified: Secondary | ICD-10-CM

## 2023-04-29 DIAGNOSIS — M25512 Pain in left shoulder: Secondary | ICD-10-CM

## 2023-04-29 NOTE — Progress Notes (Signed)
 Post-Op Visit Note   Patient: Timothy Mccarthy           Date of Birth: [birth year]           MRN: [id number] Visit Date: 04/29/2023 PCP: Hoy Register, MD   Assessment & Plan:  Chief Complaint:  Chief Complaint  Patient presents with   Right Shoulder - Routine Post Op    RIGHT SHOULDER LIPOMA REMOVAL 03/01/2023   Visit Diagnoses:  1. Lipoma, unspecified site     Plan: Patient is a 53 year old male who presents s/p right shoulder lipoma excision on 03/01/2023.  Doing well and pain is improving with each week.  Still has some increased sensitivity around the incision but overall this is improved compared with a month ago.  Not really any significant pain limiting his activity level.  On exam, incision is well-healed.  Has range of motion from 40 degrees X rotation, 110 degrees abduction, 170 degrees forward elevation passively and actively.   He has intact EPL, FPL, finger abduction, grip strength testing, pronation/supination, bicep, tricep, deltoid, trapezius rated 5/5 strength.  There is some increased sensitivity with light touch around the incision but this definitely seems improved compared with last visit.  He is overall back to work in a full capacity.  Plan for patient to follow-up as needed regarding his right shoulder.  He does have history of arthritis in his knees, shoulders, low back.  He would like to continue with physical therapy intermittently for these problems which have significantly helped him in the past.  Plan for referral to physical therapy on [facility] for bilateral knee, shoulder, back range of motion and strengthening exercises.  Follow-Up Instructions: No follow-ups on file.   Orders:  No orders of the defined types were placed in this encounter.  No orders of the defined types were placed in this encounter.   Imaging: No results found.  PMFS History: Patient Active Problem List   Diagnosis Date Noted   Lipoma of right shoulder 03/14/2023    Diabetes mellitus (HCC) 06/26/2022   Chronic pain of both knees 11/26/2017   Hyperlipidemia 12/25/2016   Anxiety 06/06/2016   Essential hypertension 11/04/2015   Abdominal hernia 09/28/2015   Appendicitis 09/15/2015   Tobacco use disorder 04/21/2015   Adjustment disorder with anxious mood 04/21/2015   Health care maintenance 04/21/2015   GERD (gastroesophageal reflux disease) 04/21/2015   Past Medical History:  Diagnosis Date   Adjustment disorder with anxious mood    Allergy    Anxiety    Arthritis    "hands" (09/28/2015) and knees   Depression    Diabetes mellitus without complication (HCC)    type 2, no meds, does not check blood sugar   GERD (gastroesophageal reflux disease)    Hyperlipidemia    Hypertension    Sleep apnea    age 62, uses nightly on autopap 5-15   Tobacco use disorder     Family History  Problem Relation Age of Onset   Stroke Mother    Diabetes type II Mother    Diabetes Mother    Colon polyps Neg Hx    Esophageal cancer Neg Hx    Rectal cancer Neg Hx    Stomach cancer Neg Hx     Past Surgical History:  Procedure Laterality Date   APPENDECTOMY  09/15/2015   CARPAL TUNNEL RELEASE Bilateral    COLONOSCOPY  02/26/2019   INCISIONAL HERNIA REPAIR N/A 09/29/2015   Procedure: HERNIA REPAIR INCISIONAL;  Surgeon: Maisie Fus  Cornett, MD;  Location: [facility] OR;  Service: General;  Laterality: N/A;   LAPAROSCOPIC APPENDECTOMY N/A 09/15/2015   Procedure: APPENDECTOMY LAPAROSCOPIC;  Surgeon: Jimmye Norman, MD;  Location: [facility] OR;  Service: General;  Laterality: N/A;   LIPOMA EXCISION Right 03/01/2023   Procedure: RIGHT SHOULDER LIPOMA REMOVAL;  Surgeon: Cammy Copa, MD;  Location: [facility] OR;  Service: Orthopedics;  Laterality: Right;   LUMPS ON BACK  2013   Lipomas removed   UPPER GI ENDOSCOPY  02/26/2019   WISDOM TOOTH EXTRACTION     Social History   Occupational History   Not on file  Tobacco Use   Smoking status: Every Day    Current packs/day: 0.50     Average packs/day: 0.5 packs/day for 36.0 years (18.0 ttl pk-yrs)    Types: Cigarettes    Start date: 04/21/1987   Smokeless tobacco: Never   Tobacco comments:    Patches RX but patient does not use patches as pf 02/27/23.  Vaping Use   Vaping status: Never Used  Substance and Sexual Activity   Alcohol use: Yes    Alcohol/week: 6.0 - 7.0 standard drinks of alcohol    Types: 6 - 7 Shots of liquor per week    Comment: "takes shots whenever has pain", 1/night   Drug use: Yes    Types: Marijuana    Comment: Last use was on 02/26/23, informed pt to withhold 24-48 hrs prior to procedure   Sexual activity: Yes

## 2023-04-29 NOTE — Addendum Note (Signed)
 Addended byPrescott Parma on: 04/29/2023 05:27 PM   Modules accepted: Orders

## 2023-04-30 NOTE — Therapy (Addendum)
 OUTPATIENT PHYSICAL THERAPY THORACOLUMBAR EVALUATION   Patient Name: Timothy Mccarthy MRN: [id number] DOB:1970/09/18, 53 y.o., male Today's Date: 05/02/2023  END OF SESSION:  PT End of Session - 05/02/23 1313     Visit Number 6    Number of Visits 17    Date for PT Re-Evaluation 06/13/23    Authorization Type BCBC no auth required    PT Start Time 0930    PT Stop Time 1015    PT Time Calculation (min) 45 min    Activity Tolerance Patient tolerated treatment well;No increased pain    Behavior During Therapy WFL for tasks assessed/performed             Past Medical History:  Diagnosis Date   Adjustment disorder with anxious mood    Allergy    Anxiety    Arthritis    "hands" (09/28/2015) and knees   Depression    Diabetes mellitus without complication (HCC)    type 2, no meds, does not check blood sugar   GERD (gastroesophageal reflux disease)    Hyperlipidemia    Hypertension    Sleep apnea    age 27, uses nightly on autopap 5-15   Tobacco use disorder    Past Surgical History:  Procedure Laterality Date   APPENDECTOMY  09/15/2015   CARPAL TUNNEL RELEASE Bilateral    COLONOSCOPY  02/26/2019   INCISIONAL HERNIA REPAIR N/A 09/29/2015   Procedure: HERNIA REPAIR INCISIONAL;  Surgeon: Harriette Bouillon, MD;  Location: [facility] OR;  Service: General;  Laterality: N/A;   LAPAROSCOPIC APPENDECTOMY N/A 09/15/2015   Procedure: APPENDECTOMY LAPAROSCOPIC;  Surgeon: Jimmye Norman, MD;  Location: [facility] OR;  Service: General;  Laterality: N/A;   LIPOMA EXCISION Right 03/01/2023   Procedure: RIGHT SHOULDER LIPOMA REMOVAL;  Surgeon: Cammy Copa, MD;  Location: [facility] OR;  Service: Orthopedics;  Laterality: Right;   LUMPS ON BACK  2013   Lipomas removed   UPPER GI ENDOSCOPY  02/26/2019   WISDOM TOOTH EXTRACTION     Patient Active Problem List   Diagnosis Date Noted   Lipoma of right shoulder 03/14/2023   Diabetes mellitus (HCC) 06/26/2022   Chronic pain of both knees 11/26/2017    Hyperlipidemia 12/25/2016   Anxiety 06/06/2016   Essential hypertension 11/04/2015   Abdominal hernia 09/28/2015   Appendicitis 09/15/2015   Tobacco use disorder 04/21/2015   Adjustment disorder with anxious mood 04/21/2015   Health care maintenance 04/21/2015   GERD (gastroesophageal reflux disease) 04/21/2015    PCP: Hoy Register, MD   REFERRING PROVIDER: Cammy Copa, MD   REFERRING DIAG:  [phone] (ICD-10-CM) - Left shoulder pain, unspecified chronicity  M25.511 (ICD-10-CM) - Right shoulder pain, unspecified chronicity  M25.569 (ICD-10-CM) - Medial joint line tenderness of knee, unspecified laterality    Rationale for Evaluation and Treatment: Rehabilitation  THERAPY DIAG:  Chronic pain of both knees  Chronic pain of both shoulders  Other low back pain  Muscle weakness (generalized)  Other abnormalities of gait and mobility  ONSET DATE: several years back but recent lipoma removal R shld/back 03-01-2023  SUBJECTIVE:  SUBJECTIVE STATEMENT: I am here with bil shld, back and bil knee pain. I had a very large lipoma removed from my back in January 17.2025. Pt reports  initial back injury when he was 20 and fell off a roof. His back and shoulder and knees have progressed over the years.   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  He reports difficulty sleeping due to pain (waking 4-5x/night on avg and not sleeping more than 2 hours at night at a time, difficulty with household tasks, self care, and work tasks.  He reports global stiffness   PERTINENT HISTORY:  Lipoma removal r shld lipoma 02/2023, carpal tunnel surgery bil > 10 years ago. I have 3 hernia surgeries, GERD, HTN,  anxiety/depression  PAIN:  Are you having pain? Yes: NPRS scale: Back 5/10 at rest and 10/10 at worst, bil shoulders 6/10 at rest and 10/10 at worst. Bil knees 5/10 at rest and 10/10 at worst Pain location: chronic bil shld, bil knees and back pain Pain description: stiffness and aching Aggravating factors: sitting no longer than 30 min and standing no more than 45 min or drive in car more than an hour. Pt has stiffness with yard work at home , difficulty sleeping , stairs and most any movement Relieving factors: has brace for knees but is not using, hot packs and showers  PRECAUTIONS: Other: PCN  RED FLAGS: None   WEIGHT BEARING RESTRICTIONS: No  FALLS:  Has patient fallen in last 6 months? No  LIVING ENVIRONMENT: Lives with: lives with their spouse Lives in: House/apartment Stairs:  no stairs inliving situation, stairs aggravate knees Has following equipment at home: None  OCCUPATION: Audiological scientist [employer]  PLOF: Independent  PATIENT GOALS: I want to be able to move around better with less pain and sleep better  NEXT MD VISIT: TBD  OBJECTIVE:  Note: Objective measures were completed at Evaluation unless otherwise noted.  DIAGNOSTIC FINDINGS:  See medical imaging  PATIENT SURVEYS:  Modified Oswestry 46%   COGNITION: Overall cognitive status: Within functional limits for tasks assessed     SENSATION: WFL  MUSCLE LENGTH: Hamstrings: Right 65 deg; Left 70 deg Thomas test: + ober bil  POSTURE: rounded shoulders, forward head, flexed trunk , and obesity  PALPATION: Pt with hypomobility over spine, global stiffness with PROM of UE and LE    UPPER EXTREMITY ROM:  Active ROM Right eval Left eval  Shoulder flexion 125 143  Shoulder extension 125 130  Shoulder abduction    Shoulder adduction    Shoulder extension    Shoulder internal rotation    Shoulder external rotation c7 c7  Elbow flexion t11 T9  Elbow extension    Wrist  flexion    Wrist extension    Wrist ulnar deviation    Wrist radial deviation    Wrist pronation    Wrist supination     (Blank rows = not tested) (Key: WFL = within functional limits not formally assessed, * = concordant pain, s = stiffness/stretching sensation, NT = not tested)  Comments:   UPPER EXTREMITY MMT:  grossly 4/5 to 4+/5  limited range due to stiffness  MMT Right eval Left eval  Shoulder flexion    Shoulder extension    Shoulder abduction    Shoulder adduction    Shoulder extension    Shoulder internal rotation    Shoulder external  rotation    Middle trapezius    Lower trapezius    Elbow flexion    Elbow extension    Wrist flexion    Wrist extension    Wrist ulnar deviation    Wrist radial deviation    Wrist pronation    Wrist supination    Grip strength     (Blank rows = not tested) (Key: WFL = within functional limits not formally assessed, * = concordant pain, s = stiffness/stretching sensation, NT = not tested)  Comments    LUMBAR ROM:   AROM eval  Flexion Fingertips to just below tibial tubercle bil  Extension 80%  Right lateral flexion Fingertips to knee jt line*  Left lateral flexion Fingertips to knee jt line *  Right rotation 25% s  Left rotation 25% s   (Blank rows = not tested) (Key: WFL = within functional limits not formally assessed, * = concordant pain, s = stiffness/stretching sensation, NT = not tested)   LOWER EXTREMITY ROM:     Active  Right eval Left eval  Hip flexion 105 100  Hip extension    Hip abduction    Hip adduction    Hip internal rotation 21 20  Hip external rotation 30 35  Knee flexion 0 but pain ful 0 but painful  Knee extension A  112 A 114  Ankle dorsiflexion    Ankle plantarflexion    Ankle inversion    Ankle eversion     (Blank rows = not tested)  LOWER EXTREMITY MMT:    MMT Right eval Left eval  Hip flexion 4 *s 4 s  Hip extension    Hip abduction 4s 4s  Hip adduction    Hip internal rotation     Hip external rotation    Knee flexion 4 4  Knee extension 4* 4*  Ankle dorsiflexion 5 5  Ankle plantarflexion    Ankle inversion    Ankle eversion     (Blank rows = not tested) (Key: WFL = within functional limits not formally assessed, * = concordant pain, s = stiffness/stretching sensation, NT = not tested)  Comments  LUMBAR SPECIAL TESTS:  Straight leg raise test: Negative and Slump test: Negative  FUNCTIONAL TESTS:  5 times sit to stand: 27.23 sec with UE support Gowers sign increase in neck and back pain  GAIT: Distance walked: 150 ft into clinic Assistive device utilized: None Level of assistance: Complete Independence Comments: decreased stride length, decreased arm swing and overall stiffness   TREATMENT DATE: EVAL         Issue HEP                                                                                                                         PATIENT EDUCATION:  Education details: POC, Explanation of findings, issue HEP Person educated: Patient Education method: Explanation, Demonstration, Tactile cues, Verbal cues, and Handouts Education comprehension: verbalized understanding, returned demonstration, verbal cues required, tactile cues  required, and needs further education  HOME EXERCISE PROGRAM: Access Code: [email or web address] URL: [email or web address] Date: 05/02/2023 Prepared by: Garen Lah  Exercises - Squat with Chair Touch  - 1 x daily - 7 x weekly - 3 sets - 10 reps - Seated Thoracic Lumbar Extension  - 2-3 x daily - 1 sets - 8 reps - Supine Lower Trunk Rotation  - 1 x daily - 7 x weekly - 1 sets - 10 reps - Supine pelvic tilit  - 1 x daily - 7 x weekly - 2 sets - 10 reps  ASSESSMENT:  CLINICAL IMPRESSION: Patient is a 53 y.o. male who was seen today for physical therapy evaluation and treatment for bil shoulder, bil knee and back pain ongoing for years and also exacerbated by recent removal of Right shoulder/back lipoma large and  subsequent decreased activity. 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   OBJECTIVE IMPAIRMENTS: Abnormal gait, decreased activity tolerance, decreased endurance, decreased mobility, difficulty walking, decreased ROM, decreased strength, hypomobility, improper body mechanics, postural dysfunction, and pain.   ACTIVITY LIMITATIONS: carrying, lifting, bending, sitting, standing, squatting, sleeping, stairs, transfers, bed mobility, reach over head, and locomotion level  PARTICIPATION LIMITATIONS: meal prep, cleaning, laundry, community activity, occupation, and yard work  PERSONAL FACTORS: Lipoma removal r shld lipoma 02/2023, carpal tunnel surgery bil > 10 years ago. I have 3 hernia surgeries, GERD, HTN, anxiety/depression are also affecting patient's functional outcome.   REHAB POTENTIAL: Good  CLINICAL DECISION MAKING: Stable/uncomplicated  EVALUATION COMPLEXITY: Low   GOALS: Goals reviewed with patient? Yes  SHORT TERM GOALS: Target date: 05-30-23  Pt will be independent with initial HEP Baseline: needs reinforcement Goal status: INITIAL  2.  Pt pain will be decreased by 25 % Baseline: Pt pain 5/10 at rest except for R shld 6/10 and 10/10 at worst Goal status: INITIAL  3.  Improve 5xSTS by MCID of 5" as indication of improved functional mobility  Baseline: eval 27.23 sec Goal status: INITIAL    LONG TERM GOALS: Target date: 06-13-23  Pt will be I with advanced HEP Baseline: no knowledge Goal status: INITIAL  2.  Pt will improved AROM of lumbar to fingertips  to ankles to show improved mobility Baseline: Eval fingertips to top 1/3 of shin and pain ful Goal status: INITIAL  3.  Pt will be able to demonstrate increased AROM of shoulders in order to place 20 # DB on overhead shelf Baseline: See AROM chart  limited AROM in R shld Goal status: INITIAL  4.  Pt will perform 5xSTS in <12 sec in order to demonstrate reduced fall risk and improved functional independence. (MCID of 2.3sec)  Baseline: 27. 23 sec eval Goal status: INITIAL  5.  Pt will show decrease in pain by 50% and demonstrate increased arm swing and trunk motion with gait Baseline: At worst 10/10, decreased flexibility at eval Goal status: INITIAL  6.  Pt will sleep for 4 or more hours of restorative sleep without waking due to pain.  Baseline: Pt unable to sleep more than 2 hours at night due to pain  while rolling over in bed Goal status: INITIAL  7.  Pt will show improved LE strength by performing 6 MWT within norms for age  Baseline Perform 6 MWT at second visit  Goal status INITIAL  PLAN:  PT FREQUENCY: 1-2x/week  PT DURATION: 8 weeks  PLANNED INTERVENTIONS: 97164- PT Re-evaluation, 97110-Therapeutic exercises, 97530- Therapeutic activity, [id number]- Neuromuscular re-education, 97535- Self Care, [id number]- Manual therapy, [id number]- Gait training, [phone]- Electrical stimulation (manual), Patient/Family education, Stair training, Taping, Dry Needling, Joint mobilization, Spinal mobilization, Cryotherapy, and Moist heat.  PLAN FOR NEXT SESSION: Do 6 MWT, Review/update HEP. Work on flexibility and strength of bil shld , bil knees and back. Pt with limited R shld range, Manual and TPDN as needed  Garen Lah, PT, [facility] Certified Exercise Expert for the Aging Adult  05/02/23 1:13 PM Phone: [phone] Fax: [phone]   Garen Lah, PT, [employer] Certified Exercise Expert for the Aging Adult  05/14/23 1:24 PM Phone: [phone] Fax: [phone]

## 2023-05-02 ENCOUNTER — Other Ambulatory Visit: Payer: Self-pay

## 2023-05-02 ENCOUNTER — Ambulatory Visit: Attending: Family Medicine | Admitting: Physical Therapy

## 2023-05-02 DIAGNOSIS — M25511 Pain in right shoulder: Secondary | ICD-10-CM | POA: Insufficient documentation

## 2023-05-02 DIAGNOSIS — M25562 Pain in left knee: Secondary | ICD-10-CM | POA: Insufficient documentation

## 2023-05-02 DIAGNOSIS — M25561 Pain in right knee: Secondary | ICD-10-CM | POA: Insufficient documentation

## 2023-05-02 DIAGNOSIS — G8929 Other chronic pain: Secondary | ICD-10-CM | POA: Diagnosis not present

## 2023-05-02 DIAGNOSIS — M6281 Muscle weakness (generalized): Secondary | ICD-10-CM | POA: Insufficient documentation

## 2023-05-02 DIAGNOSIS — M5459 Other low back pain: Secondary | ICD-10-CM | POA: Insufficient documentation

## 2023-05-02 DIAGNOSIS — M25512 Pain in left shoulder: Secondary | ICD-10-CM | POA: Insufficient documentation

## 2023-05-02 DIAGNOSIS — R2689 Other abnormalities of gait and mobility: Secondary | ICD-10-CM | POA: Insufficient documentation

## 2023-05-02 DIAGNOSIS — M25569 Pain in unspecified knee: Secondary | ICD-10-CM | POA: Diagnosis not present

## 2023-05-13 ENCOUNTER — Other Ambulatory Visit: Payer: Self-pay | Admitting: Family Medicine

## 2023-05-14 ENCOUNTER — Telehealth: Payer: Self-pay | Admitting: Physical Therapy

## 2023-05-14 ENCOUNTER — Ambulatory Visit: Attending: Family Medicine | Admitting: Physical Therapy

## 2023-05-14 DIAGNOSIS — M25512 Pain in left shoulder: Secondary | ICD-10-CM | POA: Insufficient documentation

## 2023-05-14 DIAGNOSIS — M5459 Other low back pain: Secondary | ICD-10-CM | POA: Insufficient documentation

## 2023-05-14 DIAGNOSIS — M25511 Pain in right shoulder: Secondary | ICD-10-CM | POA: Insufficient documentation

## 2023-05-14 DIAGNOSIS — M25561 Pain in right knee: Secondary | ICD-10-CM | POA: Insufficient documentation

## 2023-05-14 DIAGNOSIS — G8929 Other chronic pain: Secondary | ICD-10-CM | POA: Insufficient documentation

## 2023-05-14 DIAGNOSIS — M25562 Pain in left knee: Secondary | ICD-10-CM | POA: Insufficient documentation

## 2023-05-14 MED ORDER — PROMETHAZINE-DM 6.25-15 MG/5ML PO SYRP
5.0000 mL | ORAL_SOLUTION | Freq: Four times a day (QID) | ORAL | 0 refills | Status: AC | PRN
Start: 2023-05-14 — End: ?

## 2023-05-14 NOTE — Telephone Encounter (Signed)
 05-14-23  Pt called and told of missed appt.  NO SHOW #1  Pt has no transportation with truck in shop. And He is aware of appt on Thursday and he will be sure to be there.  Also Attendance policy reviewed and pt verbalized understanding.  Garen Lah, PT, [facility] Certified Exercise Expert for the Aging Adult  05/14/23 4:36 PM Phone: [phone] Fax: [phone]

## 2023-05-16 ENCOUNTER — Ambulatory Visit: Admitting: Physical Therapy

## 2023-05-16 ENCOUNTER — Encounter: Payer: Self-pay | Admitting: Physical Therapy

## 2023-05-16 DIAGNOSIS — M25561 Pain in right knee: Secondary | ICD-10-CM | POA: Diagnosis not present

## 2023-05-16 DIAGNOSIS — G8929 Other chronic pain: Secondary | ICD-10-CM

## 2023-05-16 DIAGNOSIS — M5459 Other low back pain: Secondary | ICD-10-CM | POA: Diagnosis not present

## 2023-05-16 DIAGNOSIS — M25562 Pain in left knee: Secondary | ICD-10-CM | POA: Diagnosis not present

## 2023-05-16 DIAGNOSIS — M25512 Pain in left shoulder: Secondary | ICD-10-CM | POA: Diagnosis not present

## 2023-05-16 DIAGNOSIS — M25511 Pain in right shoulder: Secondary | ICD-10-CM | POA: Diagnosis not present

## 2023-05-16 NOTE — Therapy (Addendum)
 OUTPATIENT PHYSICAL THERAPY THORACOLUMBAR Treatment/DISCHARGE NOTE PHYSICAL THERAPY DISCHARGE SUMMARY  Visits from Start of Care: 7  Current functional level related to goals / functional outcomes: Last noted as below but current level unknown   Remaining deficits: unknown   Education / Equipment: HEP   Patient agrees to discharge. Patient goals were not met. Patient is being discharged due to not returning since the last visit.    Patient Name: Timothy Mccarthy MRN: [id number] DOB:February 06, 1971, 53 y.o., male Today's Date: 05/16/2023  END OF SESSION:  PT End of Session - 05/16/23 1204     Visit Number 7   Number of Visits 17    Date for PT Re-Evaluation 06/13/23    Authorization Type BCBC no auth required    PT Start Time 1202    PT Stop Time 1230    PT Time Calculation (min) 28 min               Past Medical History:  Diagnosis Date   Adjustment disorder with anxious mood    Allergy    Anxiety    Arthritis    "hands" (09/28/2015) and knees   Depression    Diabetes mellitus without complication (HCC)    type 2, no meds, does not check blood sugar   GERD (gastroesophageal reflux disease)    Hyperlipidemia    Hypertension    Sleep apnea    age 53, uses nightly on autopap 5-15   Tobacco use disorder    Past Surgical History:  Procedure Laterality Date   APPENDECTOMY  09/15/2015   CARPAL TUNNEL RELEASE Bilateral    COLONOSCOPY  02/26/2019   INCISIONAL HERNIA REPAIR N/A 09/29/2015   Procedure: HERNIA REPAIR INCISIONAL;  Surgeon: Sim Dryer, MD;  Location: [facility] OR;  Service: General;  Laterality: N/A;   LAPAROSCOPIC APPENDECTOMY N/A 09/15/2015   Procedure: APPENDECTOMY LAPAROSCOPIC;  Surgeon: Jerryl Morin, MD;  Location[facility];  Service: General;  Laterality: N/A;   LIPOMA EXCISION Right 03/01/2023   Procedure: RIGHT SHOULDER LIPOMA REMOVAL;  Surgeon: Jasmine Mesi, MD;  Location: [facility] OR;  Service: Orthopedics;  Laterality: Right;   LUMPS ON BACK  2013    Lipomas removed   UPPER GI ENDOSCOPY  02/26/2019   WISDOM TOOTH EXTRACTION     Patient Active Problem List   Diagnosis Date Noted   Lipoma of right shoulder 03/14/2023   Diabetes mellitus (HCC) 06/26/2022   Chronic pain of both knees 11/26/2017   Hyperlipidemia 12/25/2016   Anxiety 06/06/2016   Essential hypertension 11/04/2015   Abdominal hernia 09/28/2015   Appendicitis 09/15/2015   Tobacco use disorder 04/21/2015   Adjustment disorder with anxious mood 04/21/2015   Health care maintenance 04/21/2015   GERD (gastroesophageal reflux disease) 04/21/2015    PCP: Joaquin Mulberry, MD   REFERRING PROVIDER: Jasmine Mesi, MD   REFERRING DIAG:  [id number] (ICD-10-CM) - Left shoulder pain, unspecified chronicity  M25.511 (ICD-10-CM) - Right shoulder pain, unspecified chronicity  M25.569 (ICD-10-CM) - Medial joint line tenderness of knee, unspecified laterality    Rationale for Evaluation and Treatment: Rehabilitation  THERAPY DIAG:  Chronic pain of both knees  Chronic pain of both shoulders  Other low back pain  ONSET DATE: several years back but recent lipoma removal R shld/back 03-01-2023  SUBJECTIVE:  SUBJECTIVE STATEMENT: Pt reports he has been busy at work and has not started his HEP. Knee pain is biggest burden today.    EVAL: I am here with bil shld, back and bil knee pain. I had a very large lipoma removed from my back in January 17.2025. Pt reports  initial back injury when he was 20 and fell off a roof. His back and shoulder and knees have progressed over the years.   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  He reports difficulty sleeping due to pain (waking 4-5x/night on avg  and not sleeping more than 2 hours at night at a time, difficulty with household tasks, self care, and work tasks.  He reports global stiffness   PERTINENT HISTORY:  Lipoma removal r shld lipoma 02/2023, carpal tunnel surgery bil > 10 years ago. I have 3 hernia surgeries, GERD, HTN, anxiety/depression  PAIN:  Are you having pain? Yes: NPRS scale: Back 3/10 at rest and 10/10 at worst, bil shoulders 5/10 at rest and 10/10 at worst. Bil knees 4/10 at rest and 10/10 at worst Pain location: chronic bil shld, bil knees and back pain Pain description: stiffness and aching Aggravating factors: sitting no longer than 30 min and standing no more than 45 min or drive in car more than an hour. Pt has stiffness with yard work at home , difficulty sleeping , stairs and most any movement Relieving factors: has brace for knees but is not using, hot packs and showers  PRECAUTIONS: Other: PCN  RED FLAGS: None   WEIGHT BEARING RESTRICTIONS: No  FALLS:  Has patient fallen in last 6 months? No  LIVING ENVIRONMENT: Lives with: lives with their spouse Lives in: House/apartment Stairs: no stairs inliving situation, stairs aggravate knees Has following equipment at home: None  OCCUPATION: Audiological scientist [employer]  PLOF: Independent  PATIENT GOALS: I want to be able to move around better with less pain and sleep better  NEXT MD VISIT: TBD  OBJECTIVE:  Note: Objective measures were completed at Evaluation unless otherwise noted.  DIAGNOSTIC FINDINGS:  See medical imaging  PATIENT SURVEYS:  Modified Oswestry 46%   COGNITION: Overall cognitive status: Within functional limits for tasks assessed     SENSATION: WFL  MUSCLE LENGTH: Hamstrings: Right 65 deg; Left 70 deg Thomas test: + ober bil  POSTURE: rounded shoulders, forward head, flexed trunk , and obesity  PALPATION: Pt with hypomobility over spine, global stiffness with PROM of UE and LE    UPPER EXTREMITY  ROM:  Active ROM Right eval Left eval  Shoulder flexion 125 143  Shoulder extension 125 130  Shoulder abduction    Shoulder adduction    Shoulder extension    Shoulder internal rotation    Shoulder external rotation c7 c7  Elbow flexion t11 T9  Elbow extension    Wrist flexion    Wrist extension    Wrist ulnar deviation    Wrist radial deviation    Wrist pronation    Wrist supination     (Blank rows = not tested) (Key: WFL = within functional limits not formally assessed, * = concordant pain, s = stiffness/stretching sensation, NT = not tested)  Comments:   UPPER EXTREMITY MMT:  grossly 4/5 to 4+/5  limited range due to stiffness  MMT Right eval Left eval  Shoulder flexion    Shoulder extension  Shoulder abduction    Shoulder adduction    Shoulder extension    Shoulder internal rotation    Shoulder external rotation    Middle trapezius    Lower trapezius    Elbow flexion    Elbow extension    Wrist flexion    Wrist extension    Wrist ulnar deviation    Wrist radial deviation    Wrist pronation    Wrist supination    Grip strength     (Blank rows = not tested) (Key: WFL = within functional limits not formally assessed, * = concordant pain, s = stiffness/stretching sensation, NT = not tested)  Comments    LUMBAR ROM:   AROM eval  Flexion Fingertips to just below tibial tubercle bil  Extension 80%  Right lateral flexion Fingertips to knee jt line*  Left lateral flexion Fingertips to knee jt line *  Right rotation 25% s  Left rotation 25% s   (Blank rows = not tested) (Key: WFL = within functional limits not formally assessed, * = concordant pain, s = stiffness/stretching sensation, NT = not tested)   LOWER EXTREMITY ROM:     Active  Right eval Left eval  Hip flexion 105 100  Hip extension    Hip abduction    Hip adduction    Hip internal rotation 21 20  Hip external rotation 30 35  Knee flexion 0 but pain ful 0 but painful  Knee extension A   112 A 114  Ankle dorsiflexion    Ankle plantarflexion    Ankle inversion    Ankle eversion     (Blank rows = not tested)  LOWER EXTREMITY MMT:    MMT Right eval Left eval  Hip flexion 4 *s 4 s  Hip extension    Hip abduction 4s 4s  Hip adduction    Hip internal rotation    Hip external rotation    Knee flexion 4 4  Knee extension 4* 4*  Ankle dorsiflexion 5 5  Ankle plantarflexion    Ankle inversion    Ankle eversion     (Blank rows = not tested) (Key: WFL = within functional limits not formally assessed, * = concordant pain, s = stiffness/stretching sensation, NT = not tested)  Comments  LUMBAR SPECIAL TESTS:  Straight leg raise test: Negative and Slump test: Negative  FUNCTIONAL TESTS:  5 times sit to stand: 27.23 sec with UE support Gowers sign increase in neck and back pain  GAIT: Distance walked: 150 ft into clinic Assistive device utilized: None Level of assistance: Complete Independence Comments: decreased stride length, decreased arm swing and overall stiffness   TREATMENT DATE:  [facility] Adult PT Treatment:                                                DATE: 05/16/23  SLR 10 x 2  Hip abduction x 10 each  PPT x 10 PPT to Bridge 10 x 2  LTR Hamstring stretch with strap  Hip flexor stretch added strap pull Shoulder pullovers with dowel 10 x  2 Standing ROW blue 10 x 2  Standing shoulder ext 10 x 2 Blue  Seated ball lumbar flexion  STS x 10   EVAL         Issue HEP  PATIENT EDUCATION:  Education details: POC, Explanation of findings, issue HEP Person educated: Patient Education method: Explanation, Demonstration, Tactile cues, Verbal cues, and Handouts Education comprehension: verbalized understanding, returned demonstration, verbal cues required, tactile cues required, and needs further education  HOME EXERCISE PROGRAM: Access Code:  [email or web address] URL: [email or web address] Date: 05/02/2023 Prepared by: Sharlet Dawson  Exercises - Squat with Chair Touch  - 1 x daily - 7 x weekly - 3 sets - 10 reps - Seated Thoracic Lumbar Extension  - 2-3 x daily - 1 sets - 8 reps - Supine Lower Trunk Rotation  - 1 x daily - 7 x weekly - 1 sets - 10 reps - Supine pelvic tilit  - 1 x daily - 7 x weekly - 2 sets - 10 reps Added - Supine shoulder Flexion Extension AAROM with Dowel to comfortable height overhead  - 1 x daily - 7 x weekly - 3 sets - 10 reps  ASSESSMENT:  CLINICAL IMPRESSION: Pt arrives late, so short session today. He reports knees are worse compared to back and shoulders. Reviewed HEP and progressed with shoulder mobility and LE strength. Added additional stretches for lumbar. Pt felt good at end of session.   EVAL: Patient is a 54 y.o. male who was seen today for physical therapy evaluation and treatment for bil shoulder, bil knee and back pain ongoing for years and also exacerbated by recent removal of Right shoulder/back lipoma large and subsequent decreased activity. 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   OBJECTIVE IMPAIRMENTS: Abnormal gait, decreased activity tolerance, decreased endurance, decreased mobility, difficulty walking, decreased ROM, decreased strength, hypomobility, improper body mechanics, postural dysfunction, and pain.   ACTIVITY LIMITATIONS: carrying, lifting, bending, sitting, standing, squatting, sleeping, stairs, transfers, bed mobility, reach over head, and locomotion  level  PARTICIPATION LIMITATIONS: meal prep, cleaning, laundry, community activity, occupation, and yard work  PERSONAL FACTORS: Lipoma removal r shld lipoma 02/2023, carpal tunnel surgery bil > 10 years ago. I have 3 hernia surgeries, GERD, HTN, anxiety/depression are also affecting patient's functional outcome.   REHAB POTENTIAL: Good  CLINICAL DECISION MAKING: Stable/uncomplicated  EVALUATION COMPLEXITY: Low   GOALS: Goals reviewed with patient? Yes  SHORT TERM GOALS: Target date: 05-30-23  Pt will be independent with initial HEP Baseline: needs reinforcement Goal status: INITIAL  2.  Pt pain will be decreased by 25 % Baseline: Pt pain 5/10 at rest except for R shld 6/10 and 10/10 at worst Goal status: INITIAL  3.  Improve 5xSTS by MCID of 5" as indication of improved functional mobility  Baseline: eval 27.23 sec Goal status: INITIAL    LONG TERM GOALS: Target date: 06-13-23  Pt will be I with advanced HEP Baseline: no knowledge Goal status: INITIAL  2.  Pt will improved AROM of lumbar to fingertips to ankles to show improved mobility Baseline: Eval fingertips to top 1/3 of shin and pain ful Goal status: INITIAL  3.  Pt will be able to demonstrate increased AROM of shoulders in order to place 20 # DB on overhead shelf Baseline: See AROM chart  limited AROM  in R shld Goal status: INITIAL  4.  Pt will perform 5xSTS in <12 sec in order to demonstrate reduced fall risk and improved functional independence. (MCID of 2.3sec)  Baseline: 27. 23 sec eval Goal status: INITIAL  5.  Pt will show decrease in pain by 50% and demonstrate increased arm swing and trunk motion with gait Baseline: At worst 10/10, decreased flexibility at eval Goal status: INITIAL  6.  Pt will sleep for 4 or more hours of restorative sleep without waking due to pain.  Baseline: Pt unable to sleep more than 2 hours at night due to pain while rolling over in bed Goal status: INITIAL  7.  Pt will  show improved LE strength by performing 6 MWT within norms for age  Baseline Perform 6 MWT at second visit  Goal status INITIAL  PLAN:  PT FREQUENCY: 1-2x/week  PT DURATION: 8 weeks  PLANNED INTERVENTIONS: 97164- PT Re-evaluation, 97110-Therapeutic exercises, 97530- Therapeutic activity, [id number]- Neuromuscular re-education, 97535- Self Care, 91478- Manual therapy, [id number]- Gait training, [phone]- Electrical stimulation (manual), Patient/Family education, Stair training, Taping, Dry Needling, Joint mobilization, Spinal mobilization, Cryotherapy, and Moist heat.  PLAN FOR NEXT SESSION: Do 6 MWT, Review/update HEP. Work on flexibility and strength of bil shld , bil knees and back. Pt with limited R shld range, Manual and TPDN as needed  Gasper Karst, PTA 05/16/23 12:07 PM Phone: [phone] Fax: [phone]   Sharlet Dawson, PT, [facility] Certified Exercise Expert for the Aging Adult  07/09/23 9:22 AM Phone: [phone] Fax: [phone]

## 2023-05-20 ENCOUNTER — Encounter: Payer: Self-pay | Admitting: Family Medicine

## 2023-05-20 ENCOUNTER — Ambulatory Visit: Attending: Family Medicine | Admitting: Family Medicine

## 2023-05-20 VITALS — BP 117/80 | HR 99 | Ht 67.0 in | Wt 201.4 lb

## 2023-05-20 DIAGNOSIS — E785 Hyperlipidemia, unspecified: Secondary | ICD-10-CM

## 2023-05-20 DIAGNOSIS — I10 Essential (primary) hypertension: Secondary | ICD-10-CM

## 2023-05-20 DIAGNOSIS — R112 Nausea with vomiting, unspecified: Secondary | ICD-10-CM

## 2023-05-20 DIAGNOSIS — F17210 Nicotine dependence, cigarettes, uncomplicated: Secondary | ICD-10-CM

## 2023-05-20 DIAGNOSIS — E1169 Type 2 diabetes mellitus with other specified complication: Secondary | ICD-10-CM | POA: Diagnosis not present

## 2023-05-20 DIAGNOSIS — M7989 Other specified soft tissue disorders: Secondary | ICD-10-CM

## 2023-05-20 DIAGNOSIS — M79642 Pain in left hand: Secondary | ICD-10-CM | POA: Diagnosis not present

## 2023-05-20 DIAGNOSIS — E119 Type 2 diabetes mellitus without complications: Secondary | ICD-10-CM

## 2023-05-20 DIAGNOSIS — F32A Depression, unspecified: Secondary | ICD-10-CM

## 2023-05-20 DIAGNOSIS — K219 Gastro-esophageal reflux disease without esophagitis: Secondary | ICD-10-CM | POA: Diagnosis not present

## 2023-05-20 DIAGNOSIS — F419 Anxiety disorder, unspecified: Secondary | ICD-10-CM

## 2023-05-20 DIAGNOSIS — F17200 Nicotine dependence, unspecified, uncomplicated: Secondary | ICD-10-CM

## 2023-05-20 DIAGNOSIS — M62838 Other muscle spasm: Secondary | ICD-10-CM

## 2023-05-20 DIAGNOSIS — Z125 Encounter for screening for malignant neoplasm of prostate: Secondary | ICD-10-CM

## 2023-05-20 DIAGNOSIS — E1159 Type 2 diabetes mellitus with other circulatory complications: Secondary | ICD-10-CM

## 2023-05-20 DIAGNOSIS — I152 Hypertension secondary to endocrine disorders: Secondary | ICD-10-CM

## 2023-05-20 LAB — POCT GLYCOSYLATED HEMOGLOBIN (HGB A1C): HbA1c, POC (controlled diabetic range): 6.1 % (ref 0.0–7.0)

## 2023-05-20 MED ORDER — FAMOTIDINE 20 MG PO TABS
20.0000 mg | ORAL_TABLET | Freq: Two times a day (BID) | ORAL | 0 refills | Status: DC
Start: 2023-05-20 — End: 2023-06-18

## 2023-05-20 MED ORDER — MELOXICAM 15 MG PO TABS: 15.0000 mg | ORAL_TABLET | Freq: Every day | ORAL | 1 refills | Status: DC

## 2023-05-20 MED ORDER — PREDNISONE 20 MG PO TABS: 20.0000 mg | ORAL_TABLET | Freq: Every day | ORAL | 0 refills | Status: DC

## 2023-05-20 MED ORDER — DULOXETINE HCL 60 MG PO CPEP: 60.0000 mg | ORAL_CAPSULE | Freq: Every day | ORAL | 1 refills | Status: AC

## 2023-05-20 MED ORDER — BUSPIRONE HCL 15 MG PO TABS: 15.0000 mg | ORAL_TABLET | Freq: Two times a day (BID) | ORAL | 1 refills | Status: AC

## 2023-05-20 MED ORDER — GABAPENTIN 300 MG PO CAPS: 300.0000 mg | ORAL_CAPSULE | Freq: Two times a day (BID) | ORAL | 1 refills | Status: AC

## 2023-05-20 MED ORDER — ATORVASTATIN CALCIUM 40 MG PO TABS: ORAL_TABLET | ORAL | 1 refills | Status: DC

## 2023-05-20 MED ORDER — AMLODIPINE BESYLATE 10 MG PO TABS: 10.0000 mg | ORAL_TABLET | Freq: Every day | ORAL | 1 refills | Status: DC

## 2023-05-20 NOTE — Progress Notes (Signed)
 Subjective:  Patient ID: Timothy Mccarthy, male    DOB: [birth year]  Age: 53 y.o. MRN: [id number]  CC: Medical Management of Chronic Issues (Hands swelling)     Discussed the use of AI scribe software for clinical note transcription with the patient, who gave verbal consent to proceed.  History of Present Illness The patient, with a history of hypertension, GERD, anxiety and depression, Osteoarthritis of the knee , laparoscopic umbilical hernia repair at [facility], type 2 diabetes mellitus presents with swelling in the left hand.  The swelling is intermittent and does not persist for long periods. The patient notes that the swelling seems to be more pronounced at night, possibly due to lying on it while sleeping. There is no associated numbness or tingling. The patient had a carpal tunnel surgery years ago, and he suspects that the swelling might be related to this past surgery or possibly due to weather changes.  In addition to the hand swelling, the patient has been experiencing episodes of vomiting for the past two weeks. The vomiting is not time-specific and can occur at any time of the day. The patient reports feeling bloated before the vomiting episodes and has been passing gas more frequently. The patient has been taking omeprazole for heartburn and indigestion. The patient is also a current smoker, smoking about half a pack a day.  His diabetes is diet controlled.  He remains adherent with his statin and antihypertensive.  He has been without his SSRI for his anxiety and depression.  As he states he never received it from the pharmacy. He continues to smoke half a pack of cigarettes a day and is not ready to quit.   Past Medical History:  Diagnosis Date   Adjustment disorder with anxious mood    Allergy    Anxiety    Arthritis    "hands" (09/28/2015) and knees   Depression    Diabetes mellitus without complication (HCC)    type 2, no meds, does not check blood sugar   GERD  (gastroesophageal reflux disease)    Hyperlipidemia    Hypertension    Sleep apnea    age 69, uses nightly on autopap 5-15   Tobacco use disorder     Past Surgical History:  Procedure Laterality Date   APPENDECTOMY  09/15/2015   CARPAL TUNNEL RELEASE Bilateral    COLONOSCOPY  02/26/2019   INCISIONAL HERNIA REPAIR N/A 09/29/2015   Procedure: HERNIA REPAIR INCISIONAL;  Surgeon: Harriette Bouillon, MD;  Location: [facility] OR;  Service: General;  Laterality: N/A;   LAPAROSCOPIC APPENDECTOMY N/A 09/15/2015   Procedure: APPENDECTOMY LAPAROSCOPIC;  Surgeon: Jimmye Norman, MD;  Location: [facility] OR;  Service: General;  Laterality: N/A;   LIPOMA EXCISION Right 03/01/2023   Procedure: RIGHT SHOULDER LIPOMA REMOVAL;  Surgeon: Cammy Copa, MD;  Location: [facility] OR;  Service: Orthopedics;  Laterality: Right;   LUMPS ON BACK  2013   Lipomas removed   UPPER GI ENDOSCOPY  02/26/2019   WISDOM TOOTH EXTRACTION      Family History  Problem Relation Age of Onset   Stroke Mother    Diabetes type II Mother    Diabetes Mother    Colon polyps Neg Hx    Esophageal cancer Neg Hx    Rectal cancer Neg Hx    Stomach cancer Neg Hx     Social History   Socioeconomic History   Marital status: Significant Other    Spouse name: Not on file   Number of children:  Not on file   Years of education: Not on file   Highest education level: Not on file  Occupational History   Not on file  Tobacco Use   Smoking status: Every Day    Current packs/day: 0.50    Average packs/day: 0.5 packs/day for 36.1 years (18.0 ttl pk-yrs)    Types: Cigarettes    Start date: 04/21/1987   Smokeless tobacco: Never   Tobacco comments:    Patches RX but patient does not use patches as pf 02/27/23.  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   Social Drivers of Corporate investment banker Strain: Low Risk  (12/31/2022)   Overall Financial Resource Strain (CARDIA)    Difficulty of Paying Living Expenses: Not very hard  Food Insecurity: Food Insecurity Present (12/31/2022)   Hunger Vital Sign    Worried About Running Out of Food in the Last Year: Sometimes true    Ran Out of Food in the Last Year: Sometimes true  Transportation Needs: No Transportation Needs (12/31/2022)   PRAPARE - Administrator, Civil Service (Medical): No    Lack of Transportation (Non-Medical): No  Physical Activity: Insufficiently Active (12/31/2022)   Exercise Vital Sign    Days of Exercise per Week: 4 days    Minutes of Exercise per Session: 30 min  Stress: Stress Concern Present (12/31/2022)   [employer] of Occupational Health - Occupational Stress Questionnaire    Feeling of Stress : To some extent  Social Connections: Moderately Integrated (12/31/2022)   Social Connection and Isolation Panel [NHANES]    Frequency of Communication with Friends and Family: Twice a week    Frequency of Social Gatherings with Friends and Family: Twice a week    Attends Religious Services: More than 4 times per year    Active Member of [employer] or Organizations: No    Attends Engineer, structural: Never    Marital Status: Married    Allergies  Allergen Reactions   Penicillins Anaphylaxis    Has patient had a PCN reaction causing immediate rash, facial/tongue/throat swelling, SOB or lightheadedness with hypotensionNO Has patient had a PCN reaction causing severe rash involving mucus membranes or skin necrosis: NO Has patient had a  PCN reaction that required hospitalization NO Has patient had a PCN reaction occurring within the last 10 years: NO If all of the above answers are "NO", then may proceed with Cephalosporin use.    Outpatient Medications Prior to Visit  Medication Sig Dispense Refill   bismuth subsalicylate (PEPTO BISMOL) 262 MG chewable tablet Chew 262-524 mg by mouth as needed (stomach upset).     Blood Glucose Monitoring Suppl (ONE TOUCH ULTRA 2) w/Device KIT Inject 1 each into the skin daily. 1 kit 0   cetirizine (ZYRTEC) 10 MG tablet TAKE 1 TABLET(10 MG) BY MOUTH DAILY 30 tablet  0   FIBER GUMMIES PO Take 1 tablet by mouth in the morning.     fluticasone (FLONASE) 50 MCG/ACT nasal spray SHAKE LIQUID AND USE 1 SPRAY IN EACH NOSTRIL DAILY 16 g 1   glucose blood (ONETOUCH ULTRA BLUE TEST) test strip Use as instructed daily 100 each 12   Lancets (ONETOUCH ULTRASOFT) lancets Use as instructed daily 100 each 12   methocarbamol (ROBAXIN) 500 MG tablet Take 1 tablet (500 mg total) by mouth every 8 (eight) hours as needed for muscle spasms. 30 tablet 1   Misc. Devices MISC CPAP machine on autopap 5 to 15 1 each 0   Multiple Vitamin (MULTIVITAMIN WITH MINERALS) TABS tablet Take 1 tablet by mouth in the morning. One A Day Multivitamin     Omega-3 Fatty Acids (FISH OIL OMEGA-3 PO) Take 1 capsule by mouth daily.     omeprazole (PRILOSEC) 40 MG capsule Take 1 capsule (40 mg total) by mouth daily. 90 capsule 1   promethazine-dextromethorphan (PROMETHAZINE-DM) 6.25-15 MG/5ML syrup Take 5 mLs by mouth 4 (four) times daily as needed for cough. 118 mL 0   zinc sulfate 220 (50 Zn) MG capsule Take 220 mg by mouth in the morning.     amLODipine (NORVASC) 10 MG tablet Take 1 tablet (10 mg total) by mouth daily. 90 tablet 1   atorvastatin (LIPITOR) 40 MG tablet TAKE 1 TABLET(40 MG) BY MOUTH DAILY (Patient taking differently: Take 40 mg by mouth every evening.) 90 tablet 1   busPIRone (BUSPAR) 15 MG tablet Take 1 tablet (15 mg  total) by mouth 2 (two) times daily. 180 tablet 1   clindamycin (CLEOCIN) 300 MG capsule Take 1 capsule (300 mg total) by mouth in the morning and at bedtime. 20 capsule 0   DULoxetine (CYMBALTA) 60 MG capsule Take 1 capsule (60 mg total) by mouth daily. 30 capsule 3   gabapentin (NEURONTIN) 300 MG capsule Take 1 capsule (300 mg total) by mouth 2 (two) times daily. 60 capsule 3   meloxicam (MOBIC) 15 MG tablet Take 1 tablet (15 mg total) by mouth daily. 90 tablet 1   nicotine (NICODERM CQ) 14 mg/24hr patch Place 1 patch (14 mg total) onto the skin daily. Then decrease to 7 mg / 24 hours (Patient not taking: Reported on 05/20/2023) 28 patch 2   hydrochlorothiazide (HYDRODIURIL) 25 MG tablet TAKE 1 TABLET(25 MG) BY MOUTH DAILY (Patient not taking: Reported on 05/20/2023) 90 tablet 1   oxyCODONE (ROXICODONE) 5 MG immediate release tablet Take 1 tablet (5 mg total) by mouth every 12 (twelve) hours as needed for severe pain (pain score 7-10). (Patient not taking: Reported on 05/20/2023) 20 tablet 0   No facility-administered medications prior to visit.     ROS Review of Systems  Constitutional:  Negative for activity change and appetite change.  HENT:  Negative for sinus pressure and sore throat.   Respiratory:  Negative for chest tightness, shortness of breath and wheezing.   Cardiovascular:  Negative for chest pain and palpitations.  Gastrointestinal:  Negative for abdominal distention, abdominal pain and constipation.  Genitourinary: Negative.   Musculoskeletal:        See HPI  Psychiatric/Behavioral:  Negative for behavioral problems and dysphoric mood.     Objective:  BP 117/80   Pulse 99   Ht 5\' 7"  (1.702 m)   Wt 201 lb 6.4 oz (91.4 kg)   SpO2 97%   BMI 31.54 kg/m      05/20/2023   10:16  AM 03/01/2023    9:40 AM 03/01/2023    9:30 AM  BP/Weight  Systolic BP 117 130 117  Diastolic BP 80 94 106  Wt. (Lbs) 201.4    BMI 31.54 kg/m2        Physical Exam Constitutional:       Appearance: He is well-developed.  Cardiovascular:     Rate and Rhythm: Normal rate.     Heart sounds: Normal heart sounds. No murmur heard. Pulmonary:     Effort: Pulmonary effort is normal.     Breath sounds: Normal breath sounds. No wheezing or rales.  Chest:     Chest wall: No tenderness.  Abdominal:     General: Bowel sounds are normal. There is no distension.     Palpations: Abdomen is soft. There is no mass.     Tenderness: There is no abdominal tenderness.  Musculoskeletal:        General: Normal range of motion.     Right lower leg: No edema.     Left lower leg: No edema.     Comments: Slight edema of left hand with tenderness on palpation of MCP and PIP joints.  Neurological:     Mental Status: He is alert and oriented to person, place, and time.  Psychiatric:        Mood and Affect: Mood normal.        Latest Ref Rng & Units 02/27/2023   11:20 AM 01/04/2023   10:12 AM 11/23/2021   12:08 PM  CMP  Glucose 70 - 99 mg/dL 962  85  95   BUN 6 - 20 mg/dL 7  13  12    Creatinine 0.61 - 1.24 mg/dL 9.52  8.41  3.24   Sodium 135 - 145 mmol/L 139  138  140   Potassium 3.5 - 5.1 mmol/L 3.6  3.5  3.8   Chloride 98 - 111 mmol/L 102  99  98   CO2 22 - 32 mmol/L 29  24  23    Calcium 8.9 - 10.3 mg/dL 9.2  9.2  40.1   Total Protein 6.0 - 8.5 g/dL  6.8  7.3   Total Bilirubin 0.0 - 1.2 mg/dL  0.3  0.4   Alkaline Phos 44 - 121 IU/L  100  109   AST 0 - 40 IU/L  26  26   ALT 0 - 44 IU/L  25  27     Lipid Panel     Component Value Date/Time   CHOL 167 01/04/2023 1012   TRIG 530 (H) 01/04/2023 1012   HDL 35 (L) 01/04/2023 1012   CHOLHDL 5.1 (H) 07/23/2019 0950   LDLCALC 54 01/04/2023 1012    CBC    Component Value Date/Time   WBC 10.2 02/27/2023 1120   RBC 5.54 02/27/2023 1120   HGB 15.4 02/27/2023 1120   HGB 14.7 05/18/2021 1145   HCT 47.2 02/27/2023 1120   HCT 43.6 05/18/2021 1145   PLT 298 02/27/2023 1120   PLT 272 05/18/2021 1145   MCV 85.2 02/27/2023 1120   MCV  82 05/18/2021 1145   MCH 27.8 02/27/2023 1120   MCHC 32.6 02/27/2023 1120   RDW 14.8 02/27/2023 1120   RDW 13.2 05/18/2021 1145   LYMPHSABS 3.1 05/18/2021 1145   MONOABS 0.8 10/18/2018 1953   EOSABS 0.3 05/18/2021 1145   BASOSABS 0.1 05/18/2021 1145    Lab Results  Component Value Date   HGBA1C 6.1 05/20/2023    The 10-year ASCVD  risk score (Arnett DK, et al., 2019) is: 25.7%   Values used to calculate the score:     Age: 86 years     Sex: Male     Is Non-Hispanic African American: Yes     Diabetic: Yes     Tobacco smoker: Yes     Systolic Blood Pressure: 117 mmHg     Is BP treated: Yes     HDL Cholesterol: 35 mg/dL     Total Cholesterol: 167 mg/dL     Assessment & Plan Left hand swelling Intermittent swelling possibly related to previous carpal tunnel surgery. Pain in joints without numbness or tingling. Differential includes arthritis and gout. - Order uric acid level and CRP. - Prescribe prednisone for 5 days with informed consent on side effects. - Continue meloxicam once completed with prednisone  Tobacco use disorder Continues smoking half a pack per day. Discussed increased cardiovascular risk with 25.7% calculated risk. - Encourage smoking cessation. - Advise contacting quit smoking line for nicotine patches as he complains previously prescribed nicotine patches were not covered by insurance.  GERD (gastroesophageal reflux disease) Increased vomiting and bloating. Evaluating for Helicobacter pylori infection. - Discontinue omeprazole for 2 weeks. - Order Helicobacter pylori breath test. - Prescribe famotidine for 30 days. - Advise dietary modifications. - Schedule follow-up in 2 weeks.  Type 2 diabetes mellitus A1c is 6.1, indicating good control. -Diet controlled - Continue current diabetes management plan.  Hypertension Blood pressure well-controlled. - Continue current antihypertensive medications. -Counseled on blood pressure goal of less than  130/80, low-sodium, DASH diet, medication compliance, 150 minutes of moderate intensity exercise per week. Discussed medication compliance, adverse effects.   Hyperlipidemia Cholesterol levels excellent. - Check cholesterol levels at next visit when fasting.  Depression and Anxiety He never received duloxetine from the pharmacy - Prescribe duloxetine.       Meds ordered this encounter  Medications   amLODipine (NORVASC) 10 MG tablet    Sig: Take 1 tablet (10 mg total) by mouth daily.    Dispense:  90 tablet    Refill:  1    Dose increase   atorvastatin (LIPITOR) 40 MG tablet    Sig: TAKE 1 TABLET(40 MG) BY MOUTH DAILY    Dispense:  90 tablet    Refill:  1   meloxicam (MOBIC) 15 MG tablet    Sig: Take 1 tablet (15 mg total) by mouth daily.    Dispense:  90 tablet    Refill:  1   busPIRone (BUSPAR) 15 MG tablet    Sig: Take 1 tablet (15 mg total) by mouth 2 (two) times daily.    Dispense:  180 tablet    Refill:  1   DULoxetine (CYMBALTA) 60 MG capsule    Sig: Take 1 capsule (60 mg total) by mouth daily.    Dispense:  90 capsule    Refill:  1    Discontinue Prozac   gabapentin (NEURONTIN) 300 MG capsule    Sig: Take 1 capsule (300 mg total) by mouth 2 (two) times daily.    Dispense:  180 capsule    Refill:  1   famotidine (PEPCID) 20 MG tablet    Sig: Take 1 tablet (20 mg total) by mouth 2 (two) times daily.    Dispense:  60 tablet    Refill:  0   predniSONE (DELTASONE) 20 MG tablet    Sig: Take 1 tablet (20 mg total) by mouth daily with breakfast.    Dispense:  5 tablet    Refill:  0    Follow-up: Return in about 6 months (around 11/19/2023) for Chronic medical conditions.       Hoy Register, MD, FAAFP. [facility] [place], [place] [phone]   05/20/2023, 12:59 PM

## 2023-05-20 NOTE — Patient Instructions (Signed)
 VISIT SUMMARY:  During today's visit, we discussed the swelling in your left hand, your recent episodes of vomiting, and reviewed your ongoing health conditions including diabetes, hypertension, and hyperlipidemia. We also talked about your smoking habits and mental health management.  YOUR PLAN:  -LEFT HAND SWELLING: The swelling in your left hand may be related to your previous carpal tunnel surgery or other conditions like arthritis or gout. We will check your uric acid levels and CRP to help determine the cause. You will take prednisone for 5 days to help reduce the swelling. Please be aware of the potential side effects of this medication.  -TOBACCO USE DISORDER: Smoking increases your risk of cardiovascular diseases. We discussed the importance of quitting smoking and I encourage you to contact the quit smoking line for support and to get nicotine patches.  -GERD (GASTROESOPHAGEAL REFLUX DISEASE): GERD is a condition where stomach acid frequently flows back into the tube connecting your mouth and stomach, causing discomfort. To address your increased vomiting and bloating, we will stop omeprazole for 2 weeks and test for Helicobacter pylori infection. You will take famotidine for 30 days and follow dietary modifications. We will follow up in 2 weeks to see how you are doing.  -TYPE 2 DIABETES MELLITUS: Your diabetes is well-controlled with an A1c of 6.1. Continue with your current diabetes management plan.  -HYPERTENSION: Your blood pressure is well-controlled. Continue taking your current antihypertensive medications.  -HYPERLIPIDEMIA: Your cholesterol levels are excellent. We will check your cholesterol levels again at your next visit when you are fasting.  -DEPRESSION AND ANXIETY: We discussed switching your medication from fluoxetine to duloxetine to better manage your symptoms. You will start taking duloxetine, and we will ensure you have refills for fluoxetine if  needed.  INSTRUCTIONS:  1. Get your uric acid level and CRP tests done as ordered. 2. Take prednisone for 5 days as prescribed. 3. Contact the quit smoking line for support and nicotine patches. 4. Stop taking omeprazole for 2 weeks and take famotidine for 30 days. 5. Follow the dietary modifications advised for GERD. 6. Schedule a follow-up appointment in 2 weeks to review your GERD symptoms. 7. Continue with your current diabetes and hypertension management plans. 8. Start taking duloxetine for depression and anxiety

## 2023-05-21 ENCOUNTER — Encounter: Payer: Self-pay | Admitting: Family Medicine

## 2023-05-21 ENCOUNTER — Telehealth: Payer: Self-pay | Admitting: Physical Therapy

## 2023-05-21 ENCOUNTER — Ambulatory Visit: Admitting: Physical Therapy

## 2023-05-21 LAB — CMP14+EGFR
ALT: 33 IU/L (ref 0–44)
AST: 28 IU/L (ref 0–40)
Albumin: 4.2 g/dL (ref 3.8–4.9)
Alkaline Phosphatase: 112 IU/L (ref 44–121)
BUN/Creatinine Ratio: 8 — ABNORMAL LOW (ref 9–20)
BUN: 7 mg/dL (ref 6–24)
Bilirubin Total: 0.2 mg/dL (ref 0.0–1.2)
CO2: 23 mmol/L (ref 20–29)
Calcium: 8.8 mg/dL (ref 8.7–10.2)
Chloride: 104 mmol/L (ref 96–106)
Creatinine, Ser: 0.83 mg/dL (ref 0.76–1.27)
Globulin, Total: 2.2 g/dL (ref 1.5–4.5)
Glucose: 106 mg/dL — ABNORMAL HIGH (ref 70–99)
Potassium: 3.9 mmol/L (ref 3.5–5.2)
Sodium: 142 mmol/L (ref 134–144)
Total Protein: 6.4 g/dL (ref 6.0–8.5)
eGFR: 105 mL/min/{1.73_m2} (ref >59)

## 2023-05-21 LAB — PSA, TOTAL AND FREE
PSA, Free Pct: 21.1 %
PSA, Free: 0.19 ng/mL
Prostate Specific Ag, Serum: 0.9 ng/mL (ref 0.0–4.0)

## 2023-05-21 LAB — SEDIMENTATION RATE: Sed Rate: 7 mm/h (ref 0–30)

## 2023-05-21 LAB — C-REACTIVE PROTEIN: CRP: <1 mg/L (ref 0–10)

## 2023-05-21 LAB — URIC ACID: Uric Acid: 4.6 mg/dL (ref 3.8–8.4)

## 2023-05-21 NOTE — Telephone Encounter (Signed)
 Contacted pt regarding missed appointment. He thought his appointment was tomorrow and was able to reschedule.

## 2023-05-22 ENCOUNTER — Ambulatory Visit: Payer: Self-pay

## 2023-05-23 ENCOUNTER — Telehealth: Payer: Self-pay | Admitting: Physical Therapy

## 2023-05-23 ENCOUNTER — Ambulatory Visit: Admitting: Physical Therapy

## 2023-05-23 NOTE — Telephone Encounter (Signed)
 LVM for NO SHOW x 2 and reminded pt of attendance policy. Pt appt have been cancelled but [name] is welcome to make appt 1 at a time if he continues to want to continue PT.   Timothy Mccarthy, PT, ATRIC Certified Exercise Expert for the Aging Adult  05/23/23 2:45 PM Phone: [phone] Fax: [phone]

## 2023-05-28 ENCOUNTER — Encounter: Admitting: Physical Therapy

## 2023-05-30 ENCOUNTER — Other Ambulatory Visit: Payer: Self-pay | Admitting: Family Medicine

## 2023-05-30 ENCOUNTER — Other Ambulatory Visit: Payer: Self-pay

## 2023-05-30 ENCOUNTER — Ambulatory Visit: Admitting: Physical Therapy

## 2023-05-30 DIAGNOSIS — R112 Nausea with vomiting, unspecified: Secondary | ICD-10-CM

## 2023-05-30 DIAGNOSIS — J3089 Other allergic rhinitis: Secondary | ICD-10-CM

## 2023-05-31 LAB — H. PYLORI BREATH COLLECTION

## 2023-05-31 LAB — H. PYLORI BREATH TEST: H pylori Breath Test: NEGATIVE

## 2023-06-03 ENCOUNTER — Other Ambulatory Visit: Payer: Self-pay | Admitting: Family Medicine

## 2023-06-03 ENCOUNTER — Encounter: Payer: Self-pay | Admitting: Family Medicine

## 2023-06-03 DIAGNOSIS — R112 Nausea with vomiting, unspecified: Secondary | ICD-10-CM

## 2023-06-03 DIAGNOSIS — K219 Gastro-esophageal reflux disease without esophagitis: Secondary | ICD-10-CM

## 2023-06-14 ENCOUNTER — Encounter: Payer: Self-pay | Admitting: Family Medicine

## 2023-06-17 ENCOUNTER — Other Ambulatory Visit: Payer: Self-pay | Admitting: Family Medicine

## 2023-06-17 MED ORDER — DIPHENOXYLATE-ATROPINE 2.5-0.025 MG PO TABS: 1.0000 | ORAL_TABLET | Freq: Four times a day (QID) | ORAL | 0 refills | Status: AC | PRN

## 2023-06-18 ENCOUNTER — Other Ambulatory Visit: Payer: Self-pay | Admitting: Family Medicine

## 2023-06-23 ENCOUNTER — Encounter: Payer: Self-pay | Admitting: Family Medicine

## 2023-06-23 ENCOUNTER — Other Ambulatory Visit: Payer: Self-pay | Admitting: Family Medicine

## 2023-06-23 DIAGNOSIS — J3089 Other allergic rhinitis: Secondary | ICD-10-CM

## 2023-06-24 MED ORDER — CETIRIZINE HCL 10 MG PO TABS: 10.0000 mg | ORAL_TABLET | Freq: Every day | ORAL | 0 refills | Status: DC

## 2023-06-24 MED ORDER — FLUTICASONE PROPIONATE 50 MCG/ACT NA SUSP: 1.0000 | Freq: Every day | NASAL | 0 refills | Status: DC

## 2023-07-01 ENCOUNTER — Ambulatory Visit: Payer: Self-pay | Admitting: Family Medicine

## 2023-07-04 ENCOUNTER — Encounter: Payer: Self-pay | Admitting: Family Medicine

## 2023-07-05 ENCOUNTER — Other Ambulatory Visit: Payer: Self-pay | Admitting: Family Medicine

## 2023-07-05 DIAGNOSIS — K219 Gastro-esophageal reflux disease without esophagitis: Secondary | ICD-10-CM

## 2023-07-05 MED ORDER — OMEPRAZOLE 40 MG PO CPDR: 40.0000 mg | DELAYED_RELEASE_CAPSULE | Freq: Every day | ORAL | 1 refills | Status: DC

## 2023-07-16 ENCOUNTER — Encounter: Payer: Self-pay | Admitting: Gastroenterology

## 2023-07-21 ENCOUNTER — Other Ambulatory Visit: Payer: Self-pay | Admitting: Family Medicine

## 2023-07-24 ENCOUNTER — Other Ambulatory Visit: Payer: Self-pay | Admitting: Family Medicine

## 2023-07-24 DIAGNOSIS — J3089 Other allergic rhinitis: Secondary | ICD-10-CM

## 2023-07-26 ENCOUNTER — Other Ambulatory Visit: Payer: Self-pay | Admitting: Family Medicine

## 2023-07-26 DIAGNOSIS — J3089 Other allergic rhinitis: Secondary | ICD-10-CM

## 2023-07-30 ENCOUNTER — Other Ambulatory Visit: Payer: Self-pay | Admitting: Family Medicine

## 2023-07-30 DIAGNOSIS — E1159 Type 2 diabetes mellitus with other circulatory complications: Secondary | ICD-10-CM

## 2023-08-14 ENCOUNTER — Encounter: Payer: Self-pay | Admitting: Family Medicine

## 2023-08-19 ENCOUNTER — Encounter: Payer: Self-pay | Admitting: Family Medicine

## 2023-08-19 ENCOUNTER — Telehealth (HOSPITAL_BASED_OUTPATIENT_CLINIC_OR_DEPARTMENT_OTHER): Admitting: Family Medicine

## 2023-08-19 DIAGNOSIS — M7989 Other specified soft tissue disorders: Secondary | ICD-10-CM

## 2023-08-19 DIAGNOSIS — G8929 Other chronic pain: Secondary | ICD-10-CM | POA: Diagnosis not present

## 2023-08-19 DIAGNOSIS — M25561 Pain in right knee: Secondary | ICD-10-CM | POA: Diagnosis not present

## 2023-08-19 DIAGNOSIS — M150 Primary generalized (osteo)arthritis: Secondary | ICD-10-CM | POA: Diagnosis not present

## 2023-08-19 MED ORDER — PREDNISONE 20 MG PO TABS: 20.0000 mg | ORAL_TABLET | Freq: Every day | ORAL | 0 refills | Status: AC

## 2023-08-19 NOTE — Progress Notes (Signed)
 Virtual Visit via Video Note  I connected with Timothy Mccarthy, on 08/19/2023 at 8:29 AM by video enabled telemedicine device and verified that I am speaking with the correct person using two identifiers.   Clinician has audio - video capabilities but patient was only able to operate/preferred audio.  Consent: I discussed the limitations, risks, security and privacy concerns of performing an evaluation and management service by telemedicine and the availability of in person appointments. I also discussed with the patient that there may be a patient responsible charge related to this service. The patient expressed understanding and agreed to proceed.   Location of Patient: Home  Location of Provider: Clinic   Persons participating in Telemedicine visit: ZIQUAN FIDEL Dr. Delbert    Discussed the use of AI scribe software for clinical note transcription with the patient, who gave verbal consent to proceed.  History of Present Illness Timothy Mccarthy is a 53 year old male with a history of hypertension, GERD, anxiety and depression, Osteoarthritis of the knee , laparoscopic umbilical hernia repair at [facility], type 2 diabetes mellitus, s/p bilateral carpal tunnel release surgery who presents with swelling in the left hand and right knee.  Swelling in the left hand and right knee began after spring cleaning and was noted upon waking yesterday. The areas are warm to the touch. He has a history of carpal tunnel surgery in both wrists, with the left hand entirely swollen. Tylenol  has been ineffective, and he is taking meloxicam  daily. Similar symptoms have occurred in the past. There is no redness in the affected areas, and he has not been diagnosed with rheumatoid arthritis. Uric acid level was normal in 05/2023.      Past Medical History:  Diagnosis Date   Adjustment disorder with anxious mood    Allergy    Anxiety    Arthritis    hands (09/28/2015) and knees   Depression    Diabetes  mellitus without complication (HCC)    type 2, no meds, does not check blood sugar   GERD (gastroesophageal reflux disease)    Hyperlipidemia    Hypertension    Sleep apnea    age 79, uses nightly on autopap 5-15   Tobacco use disorder    Allergies  Allergen Reactions   Penicillins Anaphylaxis    Has patient had a PCN reaction causing immediate rash, facial/tongue/throat swelling, SOB or lightheadedness with hypotensionNO Has patient had a PCN reaction causing severe rash involving mucus membranes or skin necrosis: NO Has patient had a PCN reaction that required hospitalization NO Has patient had a PCN reaction occurring within the last 10 years: NO If all of the above answers are NO, then may proceed with Cephalosporin use.    Current Outpatient Medications on File Prior to Visit  Medication Sig Dispense Refill   amLODipine  (NORVASC ) 10 MG tablet Take 1 tablet (10 mg total) by mouth daily. 90 tablet 1   atorvastatin  (LIPITOR) 40 MG tablet TAKE 1 TABLET(40 MG) BY MOUTH DAILY 90 tablet 1   bismuth subsalicylate (PEPTO BISMOL) 262 MG chewable tablet Chew 262-524 mg by mouth as needed (stomach upset).     Blood Glucose Monitoring Suppl (ONE TOUCH ULTRA 2) w/Device KIT Inject 1 each into the skin daily. 1 kit 0   busPIRone  (BUSPAR ) 15 MG tablet Take 1 tablet (15 mg total) by mouth 2 (two) times daily. 180 tablet 1   cetirizine  (ZYRTEC ) 10 MG tablet TAKE 1 TABLET(10 MG) BY MOUTH DAILY 30  tablet 0   diphenoxylate -atropine  (LOMOTIL ) 2.5-0.025 MG tablet Take 1 tablet by mouth 4 (four) times daily as needed for diarrhea or loose stools. 20 tablet 0   DULoxetine  (CYMBALTA ) 60 MG capsule Take 1 capsule (60 mg total) by mouth daily. 90 capsule 1   FIBER GUMMIES PO Take 1 tablet by mouth in the morning.     fluticasone  (FLONASE ) 50 MCG/ACT nasal spray SHAKE LIQUID AND USE 1 SPRAY IN EACH NOSTRIL DAILY 48 g 0   gabapentin  (NEURONTIN ) 300 MG capsule Take 1 capsule (300 mg total) by mouth 2 (two)  times daily. 180 capsule 1   glucose blood (ONETOUCH ULTRA BLUE TEST) test strip Use as instructed daily 100 each 12   Lancets (ONETOUCH ULTRASOFT) lancets Use as instructed daily 100 each 12   meloxicam  (MOBIC ) 15 MG tablet Take 1 tablet (15 mg total) by mouth daily. 90 tablet 1   methocarbamol  (ROBAXIN ) 500 MG tablet Take 1 tablet (500 mg total) by mouth every 8 (eight) hours as needed for muscle spasms. 30 tablet 1   Misc. Devices MISC CPAP machine on autopap 5 to 15 1 each 0   Multiple Vitamin (MULTIVITAMIN WITH MINERALS) TABS tablet Take 1 tablet by mouth in the morning. One A Day Multivitamin     nicotine  (NICODERM CQ ) 14 mg/24hr patch Place 1 patch (14 mg total) onto the skin daily. Then decrease to 7 mg / 24 hours (Patient not taking: Reported on 05/20/2023) 28 patch 2   Omega-3 Fatty Acids (FISH OIL OMEGA-3 PO) Take 1 capsule by mouth daily.     omeprazole  (PRILOSEC) 40 MG capsule Take 1 capsule (40 mg total) by mouth daily. 90 capsule 1   promethazine -dextromethorphan (PROMETHAZINE -DM) 6.25-15 MG/5ML syrup Take 5 mLs by mouth 4 (four) times daily as needed for cough. 118 mL 0   zinc sulfate 220 (50 Zn) MG capsule Take 220 mg by mouth in the morning.     [DISCONTINUED] sodium chloride  (OCEAN) 0.65 % SOLN nasal spray Place 1 spray into both nostrils as needed for congestion. 44 mL 0   No current facility-administered medications on file prior to visit.    ROS: See HPI  Observations/Objective: Awake, alert, oriented x3 Not in acute distress Normal mood      Latest Ref Rng & Units 05/20/2023   10:58 AM 02/27/2023   11:20 AM 01/04/2023   10:12 AM  CMP  Glucose 70 - 99 mg/dL 893  895  85   BUN 6 - 24 mg/dL 7  7  13    Creatinine 0.76 - 1.27 mg/dL 9.16  8.99  9.02   Sodium 134 - 144 mmol/L 142  139  138   Potassium 3.5 - 5.2 mmol/L 3.9  3.6  3.5   Chloride 96 - 106 mmol/L 104  102  99   CO2 20 - 29 mmol/L 23  29  24    Calcium  8.7 - 10.2 mg/dL 8.8  9.2  9.2   Total Protein 6.0 -  8.5 g/dL 6.4   6.8   Total Bilirubin 0.0 - 1.2 mg/dL 0.2   0.3   Alkaline Phos 44 - 121 IU/L 112   100   AST 0 - 40 IU/L 28   26   ALT 0 - 44 IU/L 33   25     Lipid Panel     Component Value Date/Time   CHOL 167 01/04/2023 1012   TRIG 530 (H) 01/04/2023 1012   HDL 35 (L) 01/04/2023 1012  CHOLHDL 5.1 (H) 07/23/2019 0950   LDLCALC 54 01/04/2023 1012   LABVLDL 78 (H) 01/04/2023 1012    Lab Results  Component Value Date   HGBA1C 6.1 05/20/2023     Assessment and plan:  Assessment & Plan Primary osteoarthritis/left hand swelling/right knee swelling Symptoms consistent with arthritis flare-up, likely due to physical exertion. Recent blood work negative for rheumatoid arthritis. Discussed prednisone  for inflammation reduction and meloxicam  restart post-treatment. - Prescribe 5-day prednisone  course. - Instruct to restart meloxicam  after prednisone . - Send prescription to [facility] in [facility].  Work-related absence due to acute illness Requires work note for absence due to arthritis-related acute illness. - Provide work note for absence due to acute illness and send to [employer].   There are no diagnoses linked to this encounter.   Meds ordered this encounter  Medications   predniSONE  (DELTASONE ) 20 MG tablet    Sig: Take 1 tablet (20 mg total) by mouth daily with breakfast.    Dispense:  5 tablet    Refill:  0    Follow Up Instructions: The previously scheduled appointment   I discussed the assessment and treatment plan with the patient. The patient was provided an opportunity to ask questions and all were answered. The patient agreed with the plan and demonstrated an understanding of the instructions.   The patient was advised to call back or seek an in-person evaluation if the symptoms worsen or if the condition fails to improve as anticipated.     I provided 11 minutes total of Telehealth time during this encounter including median intraservice time,  reviewing previous notes, investigations, ordering medications, medical decision making, coordinating care and patient verbalized understanding at the end of the visit.     Corrina Sabin, MD, FAAFP. [facility] [place], [place] [phone]   08/19/2023, 8:29 AM

## 2023-08-20 ENCOUNTER — Other Ambulatory Visit: Payer: Self-pay | Admitting: Family Medicine

## 2023-08-20 DIAGNOSIS — M62838 Other muscle spasm: Secondary | ICD-10-CM

## 2023-09-09 ENCOUNTER — Encounter: Payer: Self-pay | Admitting: Family Medicine

## 2023-09-10 ENCOUNTER — Other Ambulatory Visit: Payer: Self-pay

## 2023-09-10 DIAGNOSIS — J3089 Other allergic rhinitis: Secondary | ICD-10-CM

## 2023-09-10 DIAGNOSIS — E1169 Type 2 diabetes mellitus with other specified complication: Secondary | ICD-10-CM

## 2023-09-10 MED ORDER — CETIRIZINE HCL 10 MG PO TABS: 10.0000 mg | ORAL_TABLET | Freq: Every day | ORAL | 0 refills | Status: DC

## 2023-09-10 MED ORDER — FLUTICASONE PROPIONATE 50 MCG/ACT NA SUSP
1.0000 | Freq: Every day | NASAL | 0 refills | Status: DC
Start: 2023-09-10 — End: 2023-12-25

## 2023-09-10 MED ORDER — ATORVASTATIN CALCIUM 40 MG PO TABS: ORAL_TABLET | ORAL | 1 refills | Status: AC

## 2023-09-11 ENCOUNTER — Ambulatory Visit: Admitting: Gastroenterology

## 2023-10-21 ENCOUNTER — Other Ambulatory Visit: Payer: Self-pay | Admitting: Family Medicine

## 2023-10-21 DIAGNOSIS — J3089 Other allergic rhinitis: Secondary | ICD-10-CM

## 2023-10-22 NOTE — Telephone Encounter (Signed)
 Requested Prescriptions  Pending Prescriptions Disp Refills   cetirizine  (ZYRTEC ) 10 MG tablet [Pharmacy Med Name: CETIRIZINE  10MG  TABLETS] 30 tablet 2    Sig: TAKE 1 TABLET(10 MG) BY MOUTH DAILY     Ear, Nose, and Throat:  Antihistamines 2 Passed - 10/22/2023  5:36 PM      Passed - Cr in normal range and within 360 days    Creat  Date Value Ref Range Status  03/14/2016 1.36 (H) 0.60 - 1.35 mg/dL Final   Creatinine, Ser  Date Value Ref Range Status  05/20/2023 0.83 0.76 - 1.27 mg/dL Final         Passed - Valid encounter within last 12 months    Recent Outpatient Visits           2 months ago Swelling of left hand   [facility] [place]. [facility] Delbert Clam, MD   5 months ago Type 2 diabetes mellitus with other specified complication, without long-term current use of insulin  [facility])   [facility] [place]. [facility] Delbert Clam, MD   9 months ago Type 2 diabetes mellitus with other specified complication, without long-term current use of insulin  [facility])   [facility] [place]. [facility] Delbert Clam, MD   1 year ago Type 2 diabetes mellitus with other specified complication, without long-term current use of insulin  [facility])   [facility] [place] Delbert Clam, MD   1 year ago Type 2 diabetes mellitus with other specified complication, without long-term current use of insulin  [facility])   [facility] [place]. [facility] Delbert Clam, MD

## 2023-10-23 NOTE — Telephone Encounter (Signed)
 Copied from CRM [id number]. Topic: Clinical - Medication Question >> Oct 23, 2023 12:29 PM Charlet HERO wrote: Reason for CRM: Patient is calling to have the Dr. Call in antibiotics for his dental appt is not until 09/23 call at [phone].

## 2023-10-24 ENCOUNTER — Ambulatory Visit: Payer: Self-pay

## 2023-10-24 NOTE — Telephone Encounter (Signed)
 No triage- office has handled request already.       Copied from CRM #[id number]. Topic: General - Call Back - No Documentation >> Oct 24, 2023 12:44 PM Rea ORN wrote: Reason for CRM: Pt calling back to follow up on abx request from yesterday. Pt has not received a call back yet.

## 2023-11-06 ENCOUNTER — Ambulatory Visit: Payer: Self-pay

## 2023-11-06 NOTE — Telephone Encounter (Signed)
 Copied from CRM #[id number]. Topic: General - Other >> Nov 06, 2023  1:08 PM Olam RAMAN wrote: Reason for CRM: Caller: Timothy Mccarthy  mouth infection and went to the dentist 2 weeks ago and was gievn meds. stomach issues throwin up and leg/knee water retation. advised caller on red word and caller stated pt would want an appt instead.  soonest appt October. PT is diabetic. i did advise to speak to nurse for these symptoms

## 2023-11-06 NOTE — Telephone Encounter (Signed)
 Unable to reach patient for triage x 3 attempts.

## 2023-11-08 NOTE — Telephone Encounter (Signed)
 Attempted to contact patient and no answer, VM is currently full and can not take any messages.

## 2023-11-12 ENCOUNTER — Encounter: Payer: Self-pay | Admitting: Gastroenterology

## 2023-11-12 ENCOUNTER — Other Ambulatory Visit (INDEPENDENT_AMBULATORY_CARE_PROVIDER_SITE_OTHER)

## 2023-11-12 ENCOUNTER — Ambulatory Visit: Admitting: Gastroenterology

## 2023-11-12 VITALS — BP 122/78 | HR 100 | Ht 67.0 in | Wt 192.0 lb

## 2023-11-12 DIAGNOSIS — Z8719 Personal history of other diseases of the digestive system: Secondary | ICD-10-CM

## 2023-11-12 DIAGNOSIS — Z9889 Other specified postprocedural states: Secondary | ICD-10-CM

## 2023-11-12 DIAGNOSIS — R110 Nausea: Secondary | ICD-10-CM

## 2023-11-12 DIAGNOSIS — R1084 Generalized abdominal pain: Secondary | ICD-10-CM | POA: Diagnosis not present

## 2023-11-12 LAB — BASIC METABOLIC PANEL WITH GFR
BUN: 9 mg/dL (ref 6–23)
CO2: 28 meq/L (ref 19–32)
Calcium: 9 mg/dL (ref 8.4–10.5)
Chloride: 103 meq/L (ref 96–112)
Creatinine, Ser: 0.88 mg/dL (ref 0.40–1.50)
GFR: 98.79 mL/min (ref >60.00)
Glucose, Bld: 93 mg/dL (ref 70–99)
Potassium: 3.7 meq/L (ref 3.5–5.1)
Sodium: 138 meq/L (ref 135–145)

## 2023-11-12 MED ORDER — FAMOTIDINE 20 MG PO TABS: 20.0000 mg | ORAL_TABLET | Freq: Every day | ORAL | 3 refills | Status: AC

## 2023-11-12 NOTE — Patient Instructions (Signed)
 We have sent the following medications to your pharmacy for you to pick up at your convenience: Famotidine  20 mg nightly.   You have been scheduled for a CT scan of the abdomen and pelvis at [facility], 1st floor Radiology. You are scheduled on Wednesday 11/20/23 at 1:30 pm. You should arrive 15 minutes prior to your appointment time for registration.    Please follow the written instructions below on the day of your exam:   1) Do not eat anything after 11:30 am (4 hours prior to your test)    You may take any medications as prescribed with a small amount of water, if necessary. If you take any of the following medications: METFORMIN , GLUCOPHAGE , GLUCOVANCE, AVANDAMET, RIOMET , FORTAMET , ACTOPLUS MET, JANUMET, GLUMETZA  or METAGLIP, you MAY be asked to HOLD this medication 48 hours AFTER the exam.   The purpose of you drinking the oral contrast is to aid in the visualization of your intestinal tract. The contrast solution may cause some diarrhea. Depending on your individual set of symptoms, you may also receive an intravenous injection of x-ray contrast/dye. Plan on being at [facility] for 45 minutes or longer, depending on the type of exam you are having performed.   If you have any questions regarding your exam or if you need to reschedule, you may call [facility] at [phone] between the hours of 8:00 am and 5:00 pm, Monday-Friday.

## 2023-11-12 NOTE — Progress Notes (Signed)
 11/12/2023 Timothy Mccarthy [id number] 02/22/1970   HISTORY OF PRESENT ILLNESS: This is a 53 year old male who is previously patient of Dr. Eda.  His care will be reassigned to Dr. Shila.  Patient has past medical history of adjustment disorder, anxiety, depression, diabetes, sleep apnea, hypertension, hyperlipidemia, some hernia repairs.  Had an epigastric/ventral hernia repair in 2020 and an incisional hernia repair in 2017.  Does have reflux as seen by esophagitis on EGD in 2021 as below.  He is on omeprazole  40 mg daily in the mornings.  He actually complains of intermittent sharp abdominal pains and issues with nausea and occasional vomiting.  Symptoms have actually been intermittent for years.  Symptoms occur mostly at nighttime.  Does use Pepto-Bismol as needed as well.  H. pylori breath test recently negative.  CRP and sed rate normal and LFTs normal earlier this year.  Most recent hemoglobin A1c was 6.1, overall has been well-controlled at least over the past 3 years.  EGD 02/2019:  - LA Grade A reflux esophagitis. Biopsied. - Gastritis. Biopsied. - Small hiatal hernia. - Normal examined duodenum. - The examination was otherwise normal.  1. Surgical [P], gastric antrum and gastric body - ANTRAL AND OXYNTIC MUCOSA WITH SLIGHT CHRONIC GASTRITIS. - WARTHIN-STARRY NEGATIVE FOR HELICOBACTER PYLORI. - NO INTESTINAL METAPLASIA, DYSPLASIA OR MALIGNANCY. 2. Surgical [P], distal esophagus - GASTROESOPHAGEAL MUCOSA WITH MILD INFLAMMATION CONSISTENT WITH REFLUX. - NO INTESTINAL METAPLASIA, DYSPLASIA OR MALIGNANCY. 3. Surgical [P], mid esophagus and proximal esophagus - SQUAMOUS MUCOSA WITH NO SIGNIFICANT HISTOPATHOLOGIC CHANGES. - NO EOSINOPHILIC ESOPHAGITIS (LESS THAN 5 PER HIGH POWER FIELD).  Colonoscopy 02/2019:  - Three 1 to 3 mm polyps in the rectum, in the distal sigmoid colon and at the hepatic flexure, removed with a cold snare. Resected and retrieved. - Diverticulosis in the  sigmoid colon, in the descending colon and in the ascending colon. - Non- bleeding internal hemorrhoids. - The examination was otherwise normal on direct and retroflexion views.  4. Surgical [P], colon, hepatic flexure and distal sigmoid and rectal, polyp (3) - TUBULAR ADENOMA (2). - NO HIGH GRADE DYSPLASIA OR MALIGNANCY. - HYPERPLASTIC POLYP (1).   Past Medical History:  Diagnosis Date   Adjustment disorder with anxious mood    Allergy    Anxiety    Arthritis    hands (09/28/2015) and knees   Depression    Diabetes mellitus without complication (HCC)    type 2, no meds, does not check blood sugar   GERD (gastroesophageal reflux disease)    Hyperlipidemia    Hypertension    Sleep apnea    age 53, uses nightly on autopap 5-15   Tobacco use disorder    Past Surgical History:  Procedure Laterality Date   APPENDECTOMY  09/15/2015   CARPAL TUNNEL RELEASE Bilateral    COLONOSCOPY  02/26/2019   INCISIONAL HERNIA REPAIR N/A 09/29/2015   Procedure: HERNIA REPAIR INCISIONAL;  Surgeon: Debby Shipper, MD;  Location: [facility] OR;  Service: General;  Laterality: N/A;   LAPAROSCOPIC APPENDECTOMY N/A 09/15/2015   Procedure: APPENDECTOMY LAPAROSCOPIC;  Surgeon: Lynwood Pina, MD;  Location: [facility] OR;  Service: General;  Laterality: N/A;   LIPOMA EXCISION Right 03/01/2023   Procedure: RIGHT SHOULDER LIPOMA REMOVAL;  Surgeon: Addie Cordella Hamilton, MD;  Location: [facility] OR;  Service: Orthopedics;  Laterality: Right;   LUMPS ON BACK  2013   Lipomas removed   UPPER GI ENDOSCOPY  02/26/2019   WISDOM TOOTH EXTRACTION      reports  that he has been smoking cigarettes. He started smoking about 36 years ago. He has a 18.3 pack-year smoking history. He has never used smokeless tobacco. He reports current alcohol use of about 6.0 - 7.0 standard drinks of alcohol per week. He reports current drug use. Drug: Marijuana. family history includes Diabetes in his mother; Diabetes type II in his mother; Stroke in his  mother. Allergies  Allergen Reactions   Penicillins Anaphylaxis    Has patient had a PCN reaction causing immediate rash, facial/tongue/throat swelling, SOB or lightheadedness with hypotensionNO Has patient had a PCN reaction causing severe rash involving mucus membranes or skin necrosis: NO Has patient had a PCN reaction that required hospitalization NO Has patient had a PCN reaction occurring within the last 10 years: NO If all of the above answers are NO, then may proceed with Cephalosporin use.      Outpatient Encounter Medications as of 11/12/2023  Medication Sig   amLODipine  (NORVASC ) 10 MG tablet Take 1 tablet (10 mg total) by mouth daily.   bismuth subsalicylate (PEPTO BISMOL) 262 MG chewable tablet Chew 262-524 mg by mouth as needed (stomach upset).   Blood Glucose Monitoring Suppl (ONE TOUCH ULTRA 2) w/Device KIT Inject 1 each into the skin daily.   busPIRone  (BUSPAR ) 15 MG tablet Take 1 tablet (15 mg total) by mouth 2 (two) times daily.   cetirizine  (ZYRTEC ) 10 MG tablet TAKE 1 TABLET(10 MG) BY MOUTH DAILY   diphenoxylate -atropine  (LOMOTIL ) 2.5-0.025 MG tablet Take 1 tablet by mouth 4 (four) times daily as needed for diarrhea or loose stools.   DULoxetine  (CYMBALTA ) 60 MG capsule Take 1 capsule (60 mg total) by mouth daily.   FIBER GUMMIES PO Take 1 tablet by mouth in the morning.   fluticasone  (FLONASE ) 50 MCG/ACT nasal spray Place 1 spray into both nostrils daily.   gabapentin  (NEURONTIN ) 300 MG capsule Take 1 capsule (300 mg total) by mouth 2 (two) times daily.   glucose blood (ONETOUCH ULTRA BLUE TEST) test strip Use as instructed daily   Lancets (ONETOUCH ULTRASOFT) lancets Use as instructed daily   meloxicam  (MOBIC ) 15 MG tablet TAKE 1 TABLET(15 MG) BY MOUTH DAILY   Misc. Devices MISC CPAP machine on autopap 5 to 15   Multiple Vitamin (MULTIVITAMIN WITH MINERALS) TABS tablet Take 1 tablet by mouth in the morning. One A Day Multivitamin   Omega-3 Fatty Acids (FISH OIL  OMEGA-3 PO) Take 1 capsule by mouth daily.   omeprazole  (PRILOSEC) 40 MG capsule Take 1 capsule (40 mg total) by mouth daily.   predniSONE  (DELTASONE ) 20 MG tablet Take 1 tablet (20 mg total) by mouth daily with breakfast.   promethazine -dextromethorphan (PROMETHAZINE -DM) 6.25-15 MG/5ML syrup Take 5 mLs by mouth 4 (four) times daily as needed for cough.   zinc sulfate 220 (50 Zn) MG capsule Take 220 mg by mouth in the morning.   atorvastatin  (LIPITOR) 40 MG tablet TAKE 1 TABLET(40 MG) BY MOUTH DAILY (Patient not taking: Reported on 11/12/2023)   methocarbamol  (ROBAXIN ) 500 MG tablet Take 1 tablet (500 mg total) by mouth every 8 (eight) hours as needed for muscle spasms. (Patient not taking: Reported on 11/12/2023)   nicotine  (NICODERM CQ ) 14 mg/24hr patch Place 1 patch (14 mg total) onto the skin daily. Then decrease to 7 mg / 24 hours (Patient not taking: Reported on 11/12/2023)   [DISCONTINUED] sodium chloride  (OCEAN) 0.65 % SOLN nasal spray Place 1 spray into both nostrils as needed for congestion.   No facility-administered encounter  medications on file as of 11/12/2023.    REVIEW OF SYSTEMS  : All other systems reviewed and negative except where noted in the History of Present Illness.   PHYSICAL EXAM: BP 122/78   Pulse 100   Ht 5' 7 (1.702 m)   Wt 192 lb (87.1 kg)   BMI 30.07 kg/m  General: Well developed AA male in no acute distress Head: Normocephalic and atraumatic Eyes:  Sclerae anicteric, conjunctiva pink. Ears: Normal auditory acuity Lungs: Clear throughout to auscultation; no W/R/R. Heart: Regular rate and rhythm; no M/R/G. Abdomen: Soft, non-distended.  BS present.  Mild diffuse tenderness to palpation, overall abdomen is benign. Musculoskeletal: Symmetrical with no gross deformities  Skin: No lesions on visible extremities Extremities: No edema  Neurological: Alert oriented x 4, grossly non-focal Psychological:  Alert and cooperative. Normal mood and affect  ASSESSMENT  AND PLAN: *53 year old male with complaints of generalized abdominal pain and nausea.  He has history of reflux and some mild esophagitis on previous EGD.  He also has a history of ventral/epigastric hernia repair in 2020 and an incisional hernia repair in 2017.  His complaints of pain and nausea have actually been present intermittently for years, somewhat since his last hernia repair.  He is on omeprazole  40 mg daily.  No imaging since his previous hernia surgeries.  Mostly episodes of symptoms at night.  - Will plan for CT scan of the abdomen and pelvis with contrast. - BMP today check renal function for CT scan. - I have asked him to begin famotidine  20 mg at bedtime.  Prescription sent to pharmacy.   CC:  Delbert Clam, MD

## 2023-11-14 ENCOUNTER — Ambulatory Visit: Payer: Self-pay

## 2023-11-14 NOTE — Telephone Encounter (Signed)
 FYI Only or Action Required?: Action required by provider: Pain Med Request.  Patient was last seen in primary care on 08/19/2023 by Newlin, Enobong, MD.  Called Nurse Triage reporting Dental Pain.  Symptoms began yesterday.  Interventions attempted: OTC medications: Ibuprofen .  Symptoms are: unchanged.  Triage Disposition: Home Care  Patient/caregiver understands and will follow disposition?: Yes Reason for Disposition  Tooth extraction, general questions about  Answer Assessment - Initial Assessment Questions Ibuprofen  little to no relief. Patient at work currently, states he just wants to go home. Requesting pain medication sent in to pharmacy on file. Please advise.   1. SYMPTOM: What's the main symptom you're concerned about? (e.g., bleeding, pain, fever)     Pain  2. ONSET: When did the pain  start?     Yesterday   3. DATE of TOOTH EXTRACTION: When was the surgery performed?      10/1/025, states they put a piece of cadaver bone in there,  4. BLEEDING: Is there any bleeding? If Yes, ask: How much?      Yes, was told to keep gauze in mouth for a couple days  5. PAIN: Is there any pain? If Yes, ask: How bad is it?  (Scale 1-10; or mild, moderate, severe)     6/10  6. FEVER: Do you have a fever? If Yes, ask: What is your temperature, how was it measured, and when did it start?     No  7. OTHER SYMPTOMS: Do you have any other symptoms? (e.g., face swelling)     Mild swelling on left side of bottom jaw  Protocols used: Tooth Extraction-A-AH  Copied from CRM #[id number]. Topic: Clinical - Medication Question >> Nov 14, 2023 11:59 AM Debby BROCKS wrote: Reason for CRM: Patient got his tooth pulled out yesterday and would like to know if he can be prescribed pain medication to ease the pain

## 2023-11-14 NOTE — Telephone Encounter (Signed)
 Advised patient to contact dentist that remove his tooth for pain management.

## 2023-11-18 ENCOUNTER — Telehealth: Payer: Self-pay | Admitting: Family Medicine

## 2023-11-18 NOTE — Telephone Encounter (Signed)
 2nd attempt resch pt appt!

## 2023-11-18 NOTE — Telephone Encounter (Signed)
 1 st attempt contacted pt left vm to callback to resch appt pcp not in the office.

## 2023-11-19 ENCOUNTER — Ambulatory Visit: Admitting: Family Medicine

## 2023-11-20 ENCOUNTER — Ambulatory Visit (HOSPITAL_COMMUNITY)
Admission: RE | Admit: 2023-11-20 | Discharge: 2023-11-20 | Disposition: A | Discharge status: Home / Self Care | Source: Ambulatory Visit | Attending: Gastroenterology | Admitting: Gastroenterology

## 2023-11-20 DIAGNOSIS — Z9889 Other specified postprocedural states: Secondary | ICD-10-CM | POA: Insufficient documentation

## 2023-11-20 DIAGNOSIS — R160 Hepatomegaly, not elsewhere classified: Secondary | ICD-10-CM | POA: Diagnosis not present

## 2023-11-20 DIAGNOSIS — K439 Ventral hernia without obstruction or gangrene: Secondary | ICD-10-CM | POA: Diagnosis not present

## 2023-11-20 DIAGNOSIS — Z8719 Personal history of other diseases of the digestive system: Secondary | ICD-10-CM | POA: Insufficient documentation

## 2023-11-20 DIAGNOSIS — R1084 Generalized abdominal pain: Secondary | ICD-10-CM | POA: Insufficient documentation

## 2023-11-20 DIAGNOSIS — N400 Enlarged prostate without lower urinary tract symptoms: Secondary | ICD-10-CM | POA: Diagnosis not present

## 2023-11-20 DIAGNOSIS — K5730 Diverticulosis of large intestine without perforation or abscess without bleeding: Secondary | ICD-10-CM | POA: Diagnosis not present

## 2023-11-20 DIAGNOSIS — R110 Nausea: Secondary | ICD-10-CM | POA: Insufficient documentation

## 2023-11-20 MED ORDER — IOHEXOL 300 MG/ML  SOLN
100.0000 mL | Freq: Once | INTRAMUSCULAR | Status: AC | PRN
  Administered 2023-11-20: 100 mL via INTRAVENOUS

## 2023-11-20 MED ORDER — IOHEXOL 9 MG/ML PO SOLN
500.0000 mL | ORAL | Status: AC
  Administered 2023-11-20 (×2): 500 mL via ORAL

## 2023-11-21 ENCOUNTER — Emergency Department (HOSPITAL_COMMUNITY)

## 2023-11-21 ENCOUNTER — Emergency Department (HOSPITAL_COMMUNITY): Admission: EM | Admit: 2023-11-21 | Discharge: 2023-11-21 | Disposition: A | Discharge status: Home / Self Care

## 2023-11-21 ENCOUNTER — Other Ambulatory Visit: Payer: Self-pay

## 2023-11-21 ENCOUNTER — Encounter (HOSPITAL_COMMUNITY): Payer: Self-pay | Admitting: *Deleted

## 2023-11-21 DIAGNOSIS — R221 Localized swelling, mass and lump, neck: Secondary | ICD-10-CM | POA: Diagnosis not present

## 2023-11-21 DIAGNOSIS — K1120 Sialoadenitis, unspecified: Secondary | ICD-10-CM | POA: Diagnosis not present

## 2023-11-21 DIAGNOSIS — J029 Acute pharyngitis, unspecified: Secondary | ICD-10-CM | POA: Diagnosis not present

## 2023-11-21 DIAGNOSIS — R609 Edema, unspecified: Secondary | ICD-10-CM | POA: Diagnosis not present

## 2023-11-21 DIAGNOSIS — K1121 Acute sialoadenitis: Secondary | ICD-10-CM | POA: Diagnosis not present

## 2023-11-21 DIAGNOSIS — B9789 Other viral agents as the cause of diseases classified elsewhere: Secondary | ICD-10-CM | POA: Insufficient documentation

## 2023-11-21 DIAGNOSIS — R590 Localized enlarged lymph nodes: Secondary | ICD-10-CM | POA: Diagnosis not present

## 2023-11-21 LAB — CBC WITH DIFFERENTIAL/PLATELET
Abs Immature Granulocytes: 0.04 K/uL (ref 0.00–0.07)
Basophils Absolute: 0.1 K/uL (ref 0.0–0.1)
Basophils Relative: 1 %
Eosinophils Absolute: 0.2 K/uL (ref 0.0–0.5)
Eosinophils Relative: 2 %
HCT: 47.7 % (ref 39.0–52.0)
Hemoglobin: 15.3 g/dL (ref 13.0–17.0)
Immature Granulocytes: 0 %
Lymphocytes Relative: 25 %
Lymphs Abs: 2.4 K/uL (ref 0.7–4.0)
MCH: 27.3 pg (ref 26.0–34.0)
MCHC: 32.1 g/dL (ref 30.0–36.0)
MCV: 85 fL (ref 80.0–100.0)
Monocytes Absolute: 0.5 K/uL (ref 0.1–1.0)
Monocytes Relative: 5 %
Neutro Abs: 6.5 K/uL (ref 1.7–7.7)
Neutrophils Relative %: 67 %
Platelets: 315 K/uL (ref 150–400)
RBC: 5.61 MIL/uL (ref 4.22–5.81)
RDW: 14 % (ref 11.5–15.5)
WBC: 9.8 K/uL (ref 4.0–10.5)
nRBC: 0 % (ref 0.0–0.2)

## 2023-11-21 LAB — COMPREHENSIVE METABOLIC PANEL WITH GFR
ALT: 52 U/L — ABNORMAL HIGH (ref 0–44)
AST: 45 U/L — ABNORMAL HIGH (ref 15–41)
Albumin: 3.8 g/dL (ref 3.5–5.0)
Alkaline Phosphatase: 101 U/L (ref 38–126)
Anion gap: 10 (ref 5–15)
BUN: 6 mg/dL (ref 6–20)
CO2: 29 mmol/L (ref 22–32)
Calcium: 9.1 mg/dL (ref 8.9–10.3)
Chloride: 99 mmol/L (ref 98–111)
Creatinine, Ser: 0.97 mg/dL (ref 0.61–1.24)
GFR, Estimated: >60 mL/min (ref >60)
Glucose, Bld: 111 mg/dL — ABNORMAL HIGH (ref 70–99)
Potassium: 4.1 mmol/L (ref 3.5–5.1)
Sodium: 138 mmol/L (ref 135–145)
Total Bilirubin: 0.6 mg/dL (ref 0.0–1.2)
Total Protein: 7.2 g/dL (ref 6.5–8.1)

## 2023-11-21 LAB — CBG MONITORING, ED: Glucose-Capillary: 124 mg/dL — ABNORMAL HIGH (ref 70–99)

## 2023-11-21 LAB — GROUP A STREP BY PCR: Group A Strep by PCR: NOT DETECTED

## 2023-11-21 LAB — RESP PANEL BY RT-PCR (RSV, FLU A&B, COVID)  RVPGX2
Influenza A by PCR: NEGATIVE
Influenza B by PCR: NEGATIVE
Resp Syncytial Virus by PCR: NEGATIVE
SARS Coronavirus 2 by RT PCR: NEGATIVE

## 2023-11-21 LAB — MONONUCLEOSIS SCREEN: Mono Screen: NEGATIVE

## 2023-11-21 MED ORDER — IBUPROFEN 400 MG PO TABS
600.0000 mg | ORAL_TABLET | Freq: Once | ORAL | Status: AC
  Administered 2023-11-21: 600 mg via ORAL
  Filled 2023-11-21: qty 1

## 2023-11-21 MED ORDER — IOHEXOL 350 MG/ML SOLN
75.0000 mL | Freq: Once | INTRAVENOUS | Status: AC | PRN
  Administered 2023-11-21: 75 mL via INTRAVENOUS

## 2023-11-21 MED ORDER — DEXAMETHASONE 4 MG PO TABS
4.0000 mg | ORAL_TABLET | Freq: Once | ORAL | Status: AC
  Administered 2023-11-21: 4 mg via ORAL
  Filled 2023-11-21: qty 1

## 2023-11-21 NOTE — ED Notes (Signed)
 Pt reports feeling like his throat has been swollen since waking up this am.  No SOB but pt reports pain and difficulty swallowing.  ED MD notified for MSE

## 2023-11-21 NOTE — ED Provider Notes (Signed)
    ED Course / MDM   Clinical Course as of 11/21/23 1639  Thu Nov 21, 2023  1532 Received sign out from Dr. Simon pending CT neck, had sore throat/ LAD [WS]  1638 CT scan showed bilateral sialoadenitis without stone.  Suspect likely viral given bilateral nature.  Discussed results with patient.  Does have bilateral enlarged submandibular swelling consistent with this.  No erythema or significant tenderness to suggest underlying bacterial process.  Feel patient stable for discharge.  Will discharge patient to home. All questions answered. Patient comfortable with plan of discharge. Return precautions discussed with patient and specified on the after visit summary.  [WS]    Clinical Course User Index [WS] Francesca Elsie CROME, MD   Medical Decision Making Amount and/or Complexity of Data Reviewed Labs: ordered. Radiology: ordered.  Risk Prescription drug management.        Francesca Elsie CROME, MD 11/21/23 [phone]

## 2023-11-21 NOTE — ED Triage Notes (Signed)
 Pt. Stated, I woke up this morning with my tonsils swelling and sore.

## 2023-11-21 NOTE — Discharge Instructions (Signed)
 We evaluated you for your sore throat. Your testing shows that you have a viral infection of your salivary gland. This should improve on it's own with time.   Please take Tylenol  (acetaminophen ) and Motrin  (ibuprofen ) for your symptoms at home.  You can take 1000 mg of Tylenol  every 6 hours and 600 mg of Motrin  every 6 hours as needed for your symptoms.  You can take these medicines together as needed, either at the same time, or alternating every 3 hours.  Please follow up with your primary doctor as needed. Return to the ER if your symptoms worsen.

## 2023-11-21 NOTE — ED Provider Triage Note (Signed)
 Emergency Medicine Provider Triage Evaluation Note  Timothy Mccarthy , a 53 y.o. male  was evaluated in triage.  Pt complains of sore throat that started this morning when he woke up.  Able to swallow.  No fevers chills respiratory symptoms.  History of diabetes and reports glucose has been well-controlled  Review of Systems  Positive: As above Negative: As above  Physical Exam  BP (!) 137/102 (BP Location: Left Arm)   Pulse 95   Temp (!) 97.5 F (36.4 C)   Resp 16   SpO2 98%  Gen:   Awake, no distress   Resp:  Normal effort  MSK:   Moves extremities without difficulty  Other:  No pharyngeal erythema or exudate  Medical Decision Making  Medically screening exam initiated at 9:31 AM.  Appropriate orders placed.  Timothy Mccarthy was informed that the remainder of the evaluation will be completed by another provider, this initial triage assessment does not replace that evaluation, and the importance of remaining in the ED until their evaluation is complete.     Pamella Ozell LABOR, DO 11/21/23 [phone]

## 2023-11-21 NOTE — ED Notes (Signed)
 Pt verbalized understanding of discharge instructions. Pt ambulatory at time of discharge.

## 2023-11-21 NOTE — ED Provider Notes (Signed)
 [place] EMERGENCY DEPARTMENT AT [facility] Provider Note   CSN: [id number] Arrival date & time: 11/21/23  9163     Patient presents with: Sore Throat   Timothy Mccarthy is a 53 y.o. male.    Sore Throat Pertinent negatives include no chest pain, no abdominal pain and no shortness of breath.    Presents because of sore throat.  Patient states he woke up with a sore throat today.  No chills.  No obvious cough.  No obvious runny nose.  No sick contacts he is aware of.  Endorses some swelling to bilateral sides of the neck.  Patient endorses pain with swallowing but otherwise denies any kind of voice changes.  No difficulty breathing.  No shortness of breath.  Patient states he had a dental procedure on the left side of his mouth approximately 1 week ago.  Patient states that he had no complications with this since     Prior to Admission medications   Medication Sig Start Date End Date Taking? Authorizing Provider  amLODipine  (NORVASC ) 10 MG tablet Take 1 tablet (10 mg total) by mouth daily. 05/20/23   Newlin, Enobong, MD  atorvastatin  (LIPITOR) 40 MG tablet TAKE 1 TABLET(40 MG) BY MOUTH DAILY Patient not taking: Reported on 11/12/2023 09/10/23   Newlin, Enobong, MD  bismuth subsalicylate (PEPTO BISMOL) 262 MG chewable tablet Chew 262-524 mg by mouth as needed (stomach upset).    [provider]  Blood Glucose Monitoring Suppl (ONE TOUCH ULTRA 2) w/Device KIT Inject 1 each into the skin daily. 04/25/23   Newlin, Enobong, MD  busPIRone  (BUSPAR ) 15 MG tablet Take 1 tablet (15 mg total) by mouth 2 (two) times daily. 05/20/23   Newlin, Enobong, MD  cetirizine  (ZYRTEC ) 10 MG tablet TAKE 1 TABLET(10 MG) BY MOUTH DAILY 10/22/23   Newlin, Enobong, MD  diphenoxylate -atropine  (LOMOTIL ) 2.5-0.025 MG tablet Take 1 tablet by mouth 4 (four) times daily as needed for diarrhea or loose stools. 06/17/23   Newlin, Enobong, MD  DULoxetine  (CYMBALTA ) 60 MG capsule Take 1 capsule (60 mg total) by  mouth daily. 05/20/23   Newlin, Enobong, MD  famotidine  (PEPCID ) 20 MG tablet Take 1 tablet (20 mg total) by mouth at bedtime. 11/12/23   Zehr, Jessica D, PA-C  FIBER GUMMIES PO Take 1 tablet by mouth in the morning.    [provider]  fluticasone  (FLONASE ) 50 MCG/ACT nasal spray Place 1 spray into both nostrils daily. 09/10/23   Newlin, Enobong, MD  gabapentin  (NEURONTIN ) 300 MG capsule Take 1 capsule (300 mg total) by mouth 2 (two) times daily. 05/20/23   Newlin, Enobong, MD  glucose blood (ONETOUCH ULTRA BLUE TEST) test strip Use as instructed daily 04/25/23   Newlin, Enobong, MD  Lancets (ONETOUCH ULTRASOFT) lancets Use as instructed daily 04/25/23   Newlin, Enobong, MD  meloxicam  (MOBIC ) 15 MG tablet TAKE 1 TABLET(15 MG) BY MOUTH DAILY 08/20/23   Newlin, Enobong, MD  methocarbamol  (ROBAXIN ) 500 MG tablet Take 1 tablet (500 mg total) by mouth every 8 (eight) hours as needed for muscle spasms. Patient not taking: Reported on 11/12/2023 03/01/23   Magnant, Carlin CROME, PA-C  Misc. Devices MISC CPAP machine on autopap 5 to 15 03/14/21   Newlin, Enobong, MD  Multiple Vitamin (MULTIVITAMIN WITH MINERALS) TABS tablet Take 1 tablet by mouth in the morning. One A Day Multivitamin    [provider]  nicotine  (NICODERM CQ ) 14 mg/24hr patch Place 1 patch (14 mg total) onto the skin  daily. Then decrease to 7 mg / 24 hours Patient not taking: Reported on 11/12/2023 11/23/21   Newlin, Enobong, MD  Omega-3 Fatty Acids (FISH OIL OMEGA-3 PO) Take 1 capsule by mouth daily.    [provider]  omeprazole  (PRILOSEC) 40 MG capsule Take 1 capsule (40 mg total) by mouth daily. 07/05/23   Newlin, Enobong, MD  predniSONE  (DELTASONE ) 20 MG tablet Take 1 tablet (20 mg total) by mouth daily with breakfast. 08/19/23   Newlin, Enobong, MD  promethazine -dextromethorphan (PROMETHAZINE -DM) 6.25-15 MG/5ML syrup Take 5 mLs by mouth 4 (four) times daily as needed for cough. 05/14/23   Newlin, Enobong, MD  zinc sulfate 220  (50 Zn) MG capsule Take 220 mg by mouth in the morning.    [provider]  sodium chloride  (OCEAN) 0.65 % SOLN nasal spray Place 1 spray into both nostrils as needed for congestion. 06/09/19 12/02/19  Darr, Jacob, PA-C    Allergies: Penicillins    Review of Systems  Constitutional:  Negative for chills and fever.  HENT:  Negative for ear pain and sore throat.   Eyes:  Negative for pain and visual disturbance.  Respiratory:  Negative for cough and shortness of breath.   Cardiovascular:  Negative for chest pain and palpitations.  Gastrointestinal:  Negative for abdominal pain and vomiting.  Genitourinary:  Negative for dysuria and hematuria.  Musculoskeletal:  Negative for arthralgias and back pain.  Skin:  Negative for color change and rash.  Neurological:  Negative for seizures and syncope.  All other systems reviewed and are negative.   Updated Vital Signs BP (!) 127/97 (BP Location: Left Arm)   Pulse 89   Temp 98.7 F (37.1 C) (Oral)   Resp 14   SpO2 96%   Physical Exam Vitals and nursing note reviewed.  Constitutional:      General: He is not in acute distress.    Appearance: He is well-developed.  HENT:     Head: Normocephalic and atraumatic.   Eyes:     Conjunctiva/sclera: Conjunctivae normal.  Cardiovascular:     Rate and Rhythm: Normal rate and regular rhythm.     Heart sounds: No murmur heard. Pulmonary:     Effort: Pulmonary effort is normal. No respiratory distress.     Breath sounds: Normal breath sounds.  Abdominal:     Palpations: Abdomen is soft.     Tenderness: There is no abdominal tenderness.  Musculoskeletal:        General: No swelling.     Cervical back: Neck supple.  Skin:    General: Skin is warm and dry.     Capillary Refill: Capillary refill takes less than 2 seconds.  Neurological:     Mental Status: He is alert.  Psychiatric:        Mood and Affect: Mood normal.     (all labs ordered are listed, but only abnormal results  are displayed) Labs Reviewed  COMPREHENSIVE METABOLIC PANEL WITH GFR - Abnormal; Notable for the following components:      Result Value   Glucose, Bld 111 (*)    AST 45 (*)    ALT 52 (*)    All other components within normal limits  CBG MONITORING, ED - Abnormal; Notable for the following components:   Glucose-Capillary 124 (*)    All other components within normal limits  RESP PANEL BY RT-PCR (RSV, FLU A&B, COVID)  [id number]  GROUP A STREP BY PCR  CBC WITH DIFFERENTIAL/PLATELET  MONONUCLEOSIS SCREEN  EKG: None  Radiology: No results found.   Procedures   Medications Ordered in the ED  dexamethasone  (DECADRON ) tablet 4 mg (4 mg Oral Given 11/21/23 0939)  ibuprofen  (ADVIL ) tablet 600 mg (600 mg Oral Given 11/21/23 0939)  iohexol  (OMNIPAQUE ) 350 MG/ML injection 75 mL (75 mLs Intravenous Contrast Given 11/21/23 1418)    Clinical Course as of 11/21/23 1547  Thu Nov 21, 2023  1532 Received sign out from Dr. Simon pending CT neck, had sore throat/ LAD [WS]    Clinical Course User Index [WS] Francesca Elsie CROME, MD                                 Medical Decision Making Amount and/or Complexity of Data Reviewed Labs: ordered. Radiology: ordered.  Risk Prescription drug management.     HPI:  Presents because of sore throat.  Patient states he woke up with a sore throat today.  No chills.  No obvious cough.  No obvious runny nose.  No sick contacts he is aware of.  Endorses some swelling to bilateral sides of the neck.  Patient endorses pain with swallowing but otherwise denies any kind of voice changes.  No difficulty breathing.  No shortness of breath.  Patient states he had a dental procedure on the left side of his mouth approximately 1 week ago.  Patient states that he had no complications with this since  MDM:   Upon exam, patient hemodynamically stable.  Afebrile.  Room air.  No stridor on exam.  Patient does have some swelling underneath the mandible bilaterally  along the anterior aspect of the neck.  No obvious concern for Ludwig.  Patient had negative COVID RSV and flu test.  Negative strep test.  Hard time seeing the tonsils given oropharynx anatomy.  Recent dental procedure over a week ago.  Unable to really rule out any kind of peritonsillar abscess or other deep space infection.  Therefore, will obtain laboratory workup as well as CT scan scan of the soft tissue of the neck to rule out any kind of acute pathology.  This could just be lymphadenopathy given the distribution.    No leukocytosis on labs.   Patient remained hemodynamic stable.   Patient signed out pending CT scan as well as monoscreen        Final diagnoses:  None    ED Discharge Orders     None          Simon Lavonia SAILOR, MD 11/21/23 1547

## 2023-11-26 ENCOUNTER — Other Ambulatory Visit: Payer: Self-pay

## 2023-11-26 ENCOUNTER — Ambulatory Visit: Payer: Self-pay | Admitting: Gastroenterology

## 2023-11-26 DIAGNOSIS — K760 Fatty (change of) liver, not elsewhere classified: Secondary | ICD-10-CM

## 2023-11-26 DIAGNOSIS — N400 Enlarged prostate without lower urinary tract symptoms: Secondary | ICD-10-CM

## 2023-11-26 DIAGNOSIS — K7460 Unspecified cirrhosis of liver: Secondary | ICD-10-CM

## 2023-11-28 NOTE — Addendum Note (Signed)
 Addended by: Kandace Elrod on: 11/28/2023 02:52 PM   Modules accepted: Orders

## 2023-12-03 ENCOUNTER — Ambulatory Visit: Payer: Self-pay | Admitting: Gastroenterology

## 2023-12-03 ENCOUNTER — Ambulatory Visit (HOSPITAL_COMMUNITY)
Admission: RE | Admit: 2023-12-03 | Discharge: 2023-12-03 | Disposition: A | Discharge status: Home / Self Care | Source: Ambulatory Visit | Attending: Gastroenterology | Admitting: Gastroenterology

## 2023-12-03 DIAGNOSIS — K7460 Unspecified cirrhosis of liver: Secondary | ICD-10-CM | POA: Insufficient documentation

## 2023-12-03 DIAGNOSIS — K760 Fatty (change of) liver, not elsewhere classified: Secondary | ICD-10-CM | POA: Diagnosis not present

## 2023-12-03 DIAGNOSIS — R7989 Other specified abnormal findings of blood chemistry: Secondary | ICD-10-CM | POA: Diagnosis not present

## 2023-12-11 ENCOUNTER — Encounter: Payer: Self-pay | Admitting: Gastroenterology

## 2023-12-16 ENCOUNTER — Encounter: Payer: Self-pay | Admitting: Radiology

## 2023-12-17 ENCOUNTER — Ambulatory Visit: Admitting: Family Medicine

## 2023-12-18 ENCOUNTER — Encounter: Payer: Self-pay | Admitting: Gastroenterology

## 2023-12-18 ENCOUNTER — Ambulatory Visit (AMBULATORY_SURGERY_CENTER): Payer: Self-pay | Admitting: Gastroenterology

## 2023-12-18 VITALS — BP 130/99 | HR 85 | Temp 97.8°F | Resp 14 | Ht 67.0 in | Wt 192.0 lb

## 2023-12-18 DIAGNOSIS — R1084 Generalized abdominal pain: Secondary | ICD-10-CM

## 2023-12-18 DIAGNOSIS — K209 Esophagitis, unspecified without bleeding: Secondary | ICD-10-CM

## 2023-12-18 DIAGNOSIS — K219 Gastro-esophageal reflux disease without esophagitis: Secondary | ICD-10-CM

## 2023-12-18 DIAGNOSIS — Z8719 Personal history of other diseases of the digestive system: Secondary | ICD-10-CM

## 2023-12-18 DIAGNOSIS — R110 Nausea: Secondary | ICD-10-CM

## 2023-12-18 MED ORDER — OMEPRAZOLE 40 MG PO CPDR: 40.0000 mg | DELAYED_RELEASE_CAPSULE | Freq: Two times a day (BID) | ORAL | 3 refills | Status: DC

## 2023-12-18 MED ORDER — SODIUM CHLORIDE 0.9 % IV SOLN: 500.0000 mL | Freq: Once | INTRAVENOUS | Status: DC

## 2023-12-18 NOTE — Progress Notes (Unsigned)
 Pt's states no medical or surgical changes since previsit or office visit.

## 2023-12-18 NOTE — Progress Notes (Unsigned)
 Vss nad trans to pacu

## 2023-12-18 NOTE — Op Note (Signed)
 [facility] Patient Name: Timothy Mccarthy Procedure Date: 12/18/2023 10:17 AM MRN: [id number] Endoscopist: Gustav ALONSO Mcgee , MD, [id number] Age: 53 Referring MD:  Date of Birth: April 13, 1970 Gender: Male Account #: [id number] Procedure:                Upper GI endoscopy Indications:              Generalized abdominal pain, Esophageal reflux                            symptoms that persist despite appropriate therapy,                            Nausea, abnormal CT stomach with gastric wall                            thickening Medicines:                Monitored Anesthesia Care Procedure:                Pre-Anesthesia Assessment:                           - Prior to the procedure, a History and Physical                            was performed, and patient medications and                            allergies were reviewed. The patient's tolerance of                            previous anesthesia was also reviewed. The risks                            and benefits of the procedure and the sedation                            options and risks were discussed with the patient.                            All questions were answered, and informed consent                            was obtained. Prior Anticoagulants: The patient has                            taken no anticoagulant or antiplatelet agents. ASA                            Grade Assessment: III - A patient with severe                            systemic disease. After reviewing the risks and  benefits, the patient was deemed in satisfactory                            condition to undergo the procedure.                           After obtaining informed consent, the endoscope was                            passed under direct vision. Throughout the                            procedure, the patient's blood pressure, pulse, and                            oxygen saturations were monitored  continuously. The                            Olympus Scope [id number] was introduced through the                            mouth, and advanced to the second part of duodenum.                            The upper GI endoscopy was somewhat difficult due                            to presence of food. Successful completion of the                            procedure was aided by lavage. The patient                            tolerated the procedure well. Scope In: Scope Out: Findings:                 LA Grade C (one or more mucosal breaks continuous                            between tops of 2 or more mucosal folds, less than                            75% circumference) esophagitis with no bleeding was                            found 36 to 40 cm from the incisors.                           A large amount of food (residue) was found in the                            gastric fundus and in the gastric body. Suctioned  through scope                           The exam of the stomach was otherwise normal. No                            ulcers or gastric outlet obstruction                           The examined duodenum was normal. Complications:            No immediate complications. Estimated Blood Loss:     Estimated blood loss was minimal. Impression:               - LA Grade C reflux esophagitis with no bleeding.                           - A large amount of food (residue) in the stomach.                           - Normal examined duodenum.                           - No specimens collected. Recommendation:           - Gastroparesis diet.                           - Continue present medications.                           - Follow an antireflux regimen.                           - Use Prilosec (omeprazole ) 40 mg PO BID.                           - Schedule 4 hour gastric emptying scan                           - Follow up with Jessica Zehr after gastric                             emptying scan in 4-6 weeks Gustav ALONSO Mcgee, MD 12/18/2023 10:44:08 AM This report has been signed electronically.

## 2023-12-18 NOTE — Progress Notes (Unsigned)
 [facility] History and Physical   Primary Care Physician:  Delbert Clam, MD   Reason for Procedure:  generalized abd pain and nausea  Plan:    EGD  with possible interventions as needed     HPI: Timothy Mccarthy is a very pleasant 53 y.o. male here for EGD generalized abd pain and nausea The risks and benefits as well as alternatives of endoscopic procedure(s) have been discussed and reviewed.  The patient was provided an opportunity to ask questions and all were answered. The patient agreed with the plan and demonstrated an understanding of the instructions.   Past Medical History:  Diagnosis Date   Adjustment disorder with anxious mood    Allergy    Anxiety    Arthritis    hands (09/28/2015) and knees   Depression    Diabetes mellitus without complication (HCC)    type 2, no meds, does not check blood sugar   GERD (gastroesophageal reflux disease)    Hyperlipidemia    Hypertension    Sleep apnea    age 44, uses nightly on autopap 5-15   Tobacco use disorder     Past Surgical History:  Procedure Laterality Date   APPENDECTOMY  09/15/2015   CARPAL TUNNEL RELEASE Bilateral    COLONOSCOPY  02/26/2019   INCISIONAL HERNIA REPAIR N/A 09/29/2015   Procedure: HERNIA REPAIR INCISIONAL;  Surgeon: Debby Shipper, MD;  Location: [facility] OR;  Service: General;  Laterality: N/A;   LAPAROSCOPIC APPENDECTOMY N/A 09/15/2015   Procedure: APPENDECTOMY LAPAROSCOPIC;  Surgeon: Lynwood Pina, MD;  Location: [facility] OR;  Service: General;  Laterality: N/A;   LIPOMA EXCISION Right 03/01/2023   Procedure: RIGHT SHOULDER LIPOMA REMOVAL;  Surgeon: Addie Cordella Hamilton, MD;  Location: [facility] OR;  Service: Orthopedics;  Laterality: Right;   LUMPS ON BACK  2013   Lipomas removed   UPPER GI ENDOSCOPY  02/26/2019   WISDOM TOOTH EXTRACTION      Prior to Admission medications   Medication Sig Start Date End Date Taking? Authorizing Provider  amLODipine  (NORVASC ) 10 MG tablet Take 1 tablet (10 mg  total) by mouth daily. 05/20/23   Newlin, Enobong, MD  atorvastatin  (LIPITOR) 40 MG tablet TAKE 1 TABLET(40 MG) BY MOUTH DAILY Patient not taking: No sig reported 09/10/23   Newlin, Enobong, MD  bismuth subsalicylate (PEPTO BISMOL) 262 MG chewable tablet Chew 262-524 mg by mouth as needed (stomach upset).    [provider]  Blood Glucose Monitoring Suppl (ONE TOUCH ULTRA 2) w/Device KIT Inject 1 each into the skin daily. 04/25/23   Newlin, Enobong, MD  busPIRone  (BUSPAR ) 15 MG tablet Take 1 tablet (15 mg total) by mouth 2 (two) times daily. 05/20/23   Newlin, Enobong, MD  cetirizine  (ZYRTEC ) 10 MG tablet TAKE 1 TABLET(10 MG) BY MOUTH DAILY 10/22/23   Newlin, Enobong, MD  diphenoxylate -atropine  (LOMOTIL ) 2.5-0.025 MG tablet Take 1 tablet by mouth 4 (four) times daily as needed for diarrhea or loose stools. 06/17/23   Newlin, Enobong, MD  DULoxetine  (CYMBALTA ) 60 MG capsule Take 1 capsule (60 mg total) by mouth daily. 05/20/23   Newlin, Enobong, MD  famotidine  (PEPCID ) 20 MG tablet Take 1 tablet (20 mg total) by mouth at bedtime. 11/12/23   Zehr, Jessica D, PA-C  FIBER GUMMIES PO Take 1 tablet by mouth in the morning.    [provider]  fluticasone  (FLONASE ) 50 MCG/ACT nasal spray Place 1 spray into both nostrils daily. 09/10/23   Newlin, Enobong, MD  gabapentin  (NEURONTIN ) 300  MG capsule Take 1 capsule (300 mg total) by mouth 2 (two) times daily. 05/20/23   Newlin, Enobong, MD  glucose blood (ONETOUCH ULTRA BLUE TEST) test strip Use as instructed daily 04/25/23   Newlin, Enobong, MD  Lancets (ONETOUCH ULTRASOFT) lancets Use as instructed daily 04/25/23   Newlin, Enobong, MD  meloxicam  (MOBIC ) 15 MG tablet TAKE 1 TABLET(15 MG) BY MOUTH DAILY 08/20/23   Newlin, Enobong, MD  methocarbamol  (ROBAXIN ) 500 MG tablet Take 1 tablet (500 mg total) by mouth every 8 (eight) hours as needed for muscle spasms. Patient not taking: Reported on 11/12/2023 03/01/23   Magnant, Carlin CROME, PA-C  Misc. Devices MISC CPAP  machine on autopap 5 to 15 03/14/21   Newlin, Enobong, MD  Multiple Vitamin (MULTIVITAMIN WITH MINERALS) TABS tablet Take 1 tablet by mouth in the morning. One A Day Multivitamin    [provider]  nicotine  (NICODERM CQ ) 14 mg/24hr patch Place 1 patch (14 mg total) onto the skin daily. Then decrease to 7 mg / 24 hours Patient not taking: No sig reported 11/23/21   Delbert Clam, MD  Omega-3 Fatty Acids (FISH OIL OMEGA-3 PO) Take 1 capsule by mouth daily.    [provider]  omeprazole  (PRILOSEC) 40 MG capsule Take 1 capsule (40 mg total) by mouth daily. 07/05/23   Newlin, Enobong, MD  predniSONE  (DELTASONE ) 20 MG tablet Take 1 tablet (20 mg total) by mouth daily with breakfast. Patient not taking: Reported on 12/18/2023 08/19/23   Newlin, Enobong, MD  promethazine -dextromethorphan (PROMETHAZINE -DM) 6.25-15 MG/5ML syrup Take 5 mLs by mouth 4 (four) times daily as needed for cough. Patient not taking: Reported on 12/18/2023 05/14/23   Newlin, Enobong, MD  zinc sulfate 220 (50 Zn) MG capsule Take 220 mg by mouth in the morning.    [provider]  sodium chloride  (OCEAN) 0.65 % SOLN nasal spray Place 1 spray into both nostrils as needed for congestion. 06/09/19 12/02/19  Darr, Jacob, PA-C    Current Outpatient Medications  Medication Sig Dispense Refill   amLODipine  (NORVASC ) 10 MG tablet Take 1 tablet (10 mg total) by mouth daily. 90 tablet 1   atorvastatin  (LIPITOR) 40 MG tablet TAKE 1 TABLET(40 MG) BY MOUTH DAILY (Patient not taking: No sig reported) 90 tablet 1   bismuth subsalicylate (PEPTO BISMOL) 262 MG chewable tablet Chew 262-524 mg by mouth as needed (stomach upset).     Blood Glucose Monitoring Suppl (ONE TOUCH ULTRA 2) w/Device KIT Inject 1 each into the skin daily. 1 kit 0   busPIRone  (BUSPAR ) 15 MG tablet Take 1 tablet (15 mg total) by mouth 2 (two) times daily. 180 tablet 1   cetirizine  (ZYRTEC ) 10 MG tablet TAKE 1 TABLET(10 MG) BY MOUTH DAILY 30 tablet 2    diphenoxylate -atropine  (LOMOTIL ) 2.5-0.025 MG tablet Take 1 tablet by mouth 4 (four) times daily as needed for diarrhea or loose stools. 20 tablet 0   DULoxetine  (CYMBALTA ) 60 MG capsule Take 1 capsule (60 mg total) by mouth daily. 90 capsule 1   famotidine  (PEPCID ) 20 MG tablet Take 1 tablet (20 mg total) by mouth at bedtime. 90 tablet 3   FIBER GUMMIES PO Take 1 tablet by mouth in the morning.     fluticasone  (FLONASE ) 50 MCG/ACT nasal spray Place 1 spray into both nostrils daily. 48 g 0   gabapentin  (NEURONTIN ) 300 MG capsule Take 1 capsule (300 mg total) by mouth 2 (two) times daily. 180 capsule 1   glucose blood (ONETOUCH ULTRA  BLUE TEST) test strip Use as instructed daily 100 each 12   Lancets (ONETOUCH ULTRASOFT) lancets Use as instructed daily 100 each 12   meloxicam  (MOBIC ) 15 MG tablet TAKE 1 TABLET(15 MG) BY MOUTH DAILY 90 tablet 1   methocarbamol  (ROBAXIN ) 500 MG tablet Take 1 tablet (500 mg total) by mouth every 8 (eight) hours as needed for muscle spasms. (Patient not taking: Reported on 11/12/2023) 30 tablet 1   Misc. Devices MISC CPAP machine on autopap 5 to 15 1 each 0   Multiple Vitamin (MULTIVITAMIN WITH MINERALS) TABS tablet Take 1 tablet by mouth in the morning. One A Day Multivitamin     nicotine  (NICODERM CQ ) 14 mg/24hr patch Place 1 patch (14 mg total) onto the skin daily. Then decrease to 7 mg / 24 hours (Patient not taking: No sig reported) 28 patch 2   Omega-3 Fatty Acids (FISH OIL OMEGA-3 PO) Take 1 capsule by mouth daily.     omeprazole  (PRILOSEC) 40 MG capsule Take 1 capsule (40 mg total) by mouth daily. 90 capsule 1   predniSONE  (DELTASONE ) 20 MG tablet Take 1 tablet (20 mg total) by mouth daily with breakfast. (Patient not taking: Reported on 12/18/2023) 5 tablet 0   promethazine -dextromethorphan (PROMETHAZINE -DM) 6.25-15 MG/5ML syrup Take 5 mLs by mouth 4 (four) times daily as needed for cough. (Patient not taking: Reported on 12/18/2023) 118 mL 0   zinc sulfate 220 (50  Zn) MG capsule Take 220 mg by mouth in the morning.     Current Facility-Administered Medications  Medication Dose Route Frequency Provider Last Rate Last Admin   0.9 %  sodium chloride  infusion  500 mL Intravenous Once Osiel Stick V, MD        Allergies as of 12/18/2023 - Review Complete 12/18/2023  Allergen Reaction Noted   Penicillins Anaphylaxis 07/09/2012    Family History  Problem Relation Age of Onset   Stroke Mother    Diabetes type II Mother    Diabetes Mother    Colon polyps Neg Hx    Esophageal cancer Neg Hx    Rectal cancer Neg Hx    Stomach cancer Neg Hx     Social History   Socioeconomic History   Marital status: Significant Other    Spouse name: Not on file   Number of children: Not on file   Years of education: Not on file   Highest education level: Not on file  Occupational History   Not on file  Tobacco Use   Smoking status: Every Day    Current packs/day: 0.50    Average packs/day: 0.5 packs/day for 36.7 years (18.3 ttl pk-yrs)    Types: Cigarettes    Start date: 04/21/1987   Smokeless tobacco: Never   Tobacco comments:    Patches RX but patient does not use patches as pf 02/27/23.  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   Social Drivers of Corporate Investment Banker Strain: Low Risk  (12/31/2022)   Overall Financial Resource Strain (CARDIA)    Difficulty of Paying Living Expenses: Not very hard  Food Insecurity: Food Insecurity  Present (12/31/2022)   Hunger Vital Sign    Worried About Running Out of Food in the Last Year: Sometimes true    Ran Out of Food in the Last Year: Sometimes true  Transportation Needs: No Transportation Needs (12/31/2022)   PRAPARE - Administrator, Civil Service (Medical): No    Lack of Transportation (Non-Medical): No  Physical Activity: Insufficiently Active (12/31/2022)   Exercise Vital Sign    Days of Exercise per Week: 4 days    Minutes of Exercise per Session: 30 min  Stress: Stress Concern Present (12/31/2022)   [employer] of Occupational Health - Occupational Stress Questionnaire    Feeling of Stress : To some extent  Social Connections: Moderately Integrated (12/31/2022)   Social Connection and Isolation Panel    Frequency of Communication with Friends and Family: Twice a week    Frequency of Social Gatherings with Friends and Family: Twice a week    Attends Religious Services: More than 4 times per year    Active Member of [employer] or Organizations: No    Attends Banker Meetings: Never    Marital Status: Married  Catering Manager Violence: Not At Risk (12/31/2022)   Humiliation, Afraid, Rape, and Kick questionnaire    Fear of Current or Ex-Partner: No    Emotionally Abused: No    Physically Abused: No    Sexually Abused: No    Review of Systems:  All other review of systems negative except as mentioned in the HPI.  Physical Exam: Vital signs in last 24 hours: BP (!) 169/115   Pulse 93   Temp 97.8 F (36.6 C)   Resp 16   Ht 5' 7 (1.702 m)   Wt 192 lb (87.1 kg)   SpO2 98%   BMI 30.07 kg/m  General:   Alert, NAD Lungs:  Clear .   Heart:  Regular rate and rhythm Abdomen:  Soft, nontender and nondistended. Neuro/Psych:  Alert and cooperative. Normal mood and affect. A and O x 3  Reviewed labs, radiology imaging, old records and pertinent past GI work up  Patient is appropriate for planned procedure(s) and anesthesia in an  ambulatory setting   K. Veena Jessabelle Markiewicz , MD [phone]

## 2023-12-18 NOTE — Patient Instructions (Signed)
 Thank you for letting us  take care of your healthcare needs today. Please see handouts given to you on GERD/Antireflux symptoms. Take Omeprazole  40 mg twice a day. New RX with refills sent to your Pharmacy. Appt to be scheduled for a Gastric Emptying study to be scheduled by the office. Follow up appt on February 03 2024 with Harlene Mail PA to discuss results of the study.    YOU HAD AN ENDOSCOPIC PROCEDURE TODAY AT THE [facility]:   Refer to the procedure report that was given to you for any specific questions about what was found during the examination.  If the procedure report does not answer your questions, please call your gastroenterologist to clarify.  If you requested that your care partner not be given the details of your procedure findings, then the procedure report has been included in a sealed envelope for you to review at your convenience later.  YOU SHOULD EXPECT: Some feelings of bloating in the abdomen. Passage of more gas than usual.  Walking can help get rid of the air that was put into your GI tract during the procedure and reduce the bloating. If you had a lower endoscopy (such as a colonoscopy or flexible sigmoidoscopy) you may notice spotting of blood in your stool or on the toilet paper. If you underwent a bowel prep for your procedure, you may not have a normal bowel movement for a few days.  Please Note:  You might notice some irritation and congestion in your nose or some drainage.  This is from the oxygen used during your procedure.  There is no need for concern and it should clear up in a day or so.  SYMPTOMS TO REPORT IMMEDIATELY:   Following upper endoscopy (EGD)  Vomiting of blood or coffee ground material  New chest pain or pain under the shoulder blades  Painful or persistently difficult swallowing  New shortness of breath  Fever of 100F or higher  Black, tarry-looking stools  For urgent or emergent issues, a gastroenterologist can be reached  at any hour by calling [phone]. Do not use MyChart messaging for urgent concerns.    DIET:  We do recommend a small meal at first, but then you may proceed to your regular diet.  Drink plenty of fluids but you should avoid alcoholic beverages for 24 hours.  ACTIVITY:  You should plan to take it easy for the rest of today and you should NOT DRIVE or use heavy machinery until tomorrow (because of the sedation medicines used during the test).    FOLLOW UP: Our staff will call the number listed on your records the next business day following your procedure.  We will call around 7:15- 8:00 am to check on you and address any questions or concerns that you may have regarding the information given to you following your procedure. If we do not reach you, we will leave a message.     If any biopsies were taken you will be contacted by phone or by letter within the next 1-3 weeks.  Please call us  at [phone] if you have not heard about the biopsies in 3 weeks.    SIGNATURES/CONFIDENTIALITY: You and/or your care partner have signed paperwork which will be entered into your electronic medical record.  These signatures attest to the fact that that the information above on your After Visit Summary has been reviewed and is understood.  Full responsibility of the confidentiality of this discharge information lies with you  and/or your care-partner.

## 2023-12-19 ENCOUNTER — Telehealth: Payer: Self-pay

## 2023-12-19 ENCOUNTER — Encounter: Payer: Self-pay | Admitting: Gastroenterology

## 2023-12-19 ENCOUNTER — Other Ambulatory Visit: Payer: Self-pay

## 2023-12-19 DIAGNOSIS — R1084 Generalized abdominal pain: Secondary | ICD-10-CM

## 2023-12-19 DIAGNOSIS — K219 Gastro-esophageal reflux disease without esophagitis: Secondary | ICD-10-CM

## 2023-12-19 DIAGNOSIS — R110 Nausea: Secondary | ICD-10-CM

## 2023-12-19 NOTE — Telephone Encounter (Signed)
  Follow up Call-     12/18/2023    9:31 AM  Call back number  Post procedure Call Back phone  # [phone]  Permission to leave phone message Yes     Patient questions:  Do you have a fever, pain , or abdominal swelling? No. Pain Score  0 *  Have you tolerated food without any problems? Yes.    Have you been able to return to your normal activities? Yes.    Do you have any questions about your discharge instructions: Diet   No. Medications  No. Follow up visit  No.  Do you have questions or concerns about your Care? No.  Actions: * If pain score is 4 or above: No action needed, pain <4.

## 2023-12-20 ENCOUNTER — Telehealth: Payer: Self-pay

## 2023-12-20 NOTE — Telephone Encounter (Signed)
 Patient contacted about gastric emptying study and has been scheduled. Keep appointment for office visit follow up in December.

## 2023-12-25 ENCOUNTER — Ambulatory Visit: Payer: Self-pay

## 2023-12-25 DIAGNOSIS — J3089 Other allergic rhinitis: Secondary | ICD-10-CM

## 2023-12-25 DIAGNOSIS — E1159 Type 2 diabetes mellitus with other circulatory complications: Secondary | ICD-10-CM

## 2023-12-25 MED ORDER — FLUTICASONE PROPIONATE 50 MCG/ACT NA SUSP: 1.0000 | Freq: Every day | NASAL | 0 refills | Status: AC

## 2023-12-25 MED ORDER — AMLODIPINE BESYLATE 10 MG PO TABS: 10.0000 mg | ORAL_TABLET | Freq: Every day | ORAL | 0 refills | Status: AC

## 2023-12-25 NOTE — Telephone Encounter (Signed)
 FYI Only or Action Required?: Action required by provider: medication refill request and clinical question for provider.  Patient was last seen in primary care on 08/19/2023 by Newlin, Enobong, MD.  Called Nurse Triage reporting Nausea and Medication Refill.  Triage Disposition: Call PCP Now  Patient/caregiver understands and will follow disposition?: Yes        Copied from CRM [id number]. Topic: Clinical - Red Word Triage >> Dec 25, 2023  4:04 PM Hadassah PARAS wrote: Red Word that prompted transfer to Nurse Triage: Scar on liver and will be having a new hernia surgery, nausea, hard for pt to keep food down, vomiting       Reason for Disposition  [1] Prescription refill request for ESSENTIAL medicine (i.e., likelihood of harm to patient if not taken) AND [2] triager unable to refill per department policy  Answer Assessment - Initial Assessment Questions Flonase  and amlodipine  need to be prescribed. No appointments available until next month. Patient also wants to know if he should be taking his Pepcid  due to it making him feel funny. Please advise.       1. DRUG NAME: What medicine do you need to have refilled?     Flonase  and Amlodipine   4. PRESCRIBER: Who prescribed it? Note: The prescribing doctor or group is responsible for refill approvals..     Dr. Newlin  6. SYMPTOMS: Do you have any symptoms?     Patient is experiencing nausea and vomiting for about a month due to a hernia he is seeing a gastroenterologist for  Protocols used: Medication Refill and Renewal Call-A-AH

## 2023-12-25 NOTE — Addendum Note (Signed)
 Addended by: Kiley Solimine on: 12/25/2023 05:36 PM   Modules accepted: Orders

## 2023-12-27 ENCOUNTER — Encounter (HOSPITAL_COMMUNITY): Payer: Self-pay

## 2023-12-27 ENCOUNTER — Other Ambulatory Visit: Payer: Self-pay

## 2023-12-27 ENCOUNTER — Ambulatory Visit (HOSPITAL_COMMUNITY)
Admission: RE | Admit: 2023-12-27 | Discharge: 2023-12-27 | Disposition: A | Discharge status: Home / Self Care | Payer: Self-pay | Source: Ambulatory Visit | Attending: Gastroenterology | Admitting: Gastroenterology

## 2023-12-27 DIAGNOSIS — K219 Gastro-esophageal reflux disease without esophagitis: Secondary | ICD-10-CM | POA: Insufficient documentation

## 2023-12-27 DIAGNOSIS — J3089 Other allergic rhinitis: Secondary | ICD-10-CM

## 2023-12-27 DIAGNOSIS — R1084 Generalized abdominal pain: Secondary | ICD-10-CM | POA: Insufficient documentation

## 2023-12-27 DIAGNOSIS — I152 Hypertension secondary to endocrine disorders: Secondary | ICD-10-CM

## 2023-12-27 DIAGNOSIS — R110 Nausea: Secondary | ICD-10-CM | POA: Insufficient documentation

## 2023-12-27 MED ORDER — TECHNETIUM TC 99M SULFUR COLLOID
2.0000 | Freq: Once | INTRAVENOUS | Status: AC
  Administered 2023-12-27: 2.2 via ORAL

## 2023-12-31 ENCOUNTER — Ambulatory Visit: Payer: Self-pay | Admitting: Gastroenterology

## 2024-01-22 ENCOUNTER — Other Ambulatory Visit: Payer: Self-pay | Admitting: Family Medicine

## 2024-01-22 ENCOUNTER — Ambulatory Visit: Payer: Self-pay | Admitting: Urology

## 2024-01-22 ENCOUNTER — Encounter: Payer: Self-pay | Admitting: Family Medicine

## 2024-01-22 MED ORDER — ONDANSETRON HCL 4 MG PO TABS: 4.0000 mg | ORAL_TABLET | Freq: Three times a day (TID) | ORAL | 0 refills | Status: AC | PRN

## 2024-01-22 NOTE — Progress Notes (Deleted)
 Chief Complaint: No chief complaint on file.   History of Present Illness:  Timothy Mccarthy is a 53 y.o. male who is seen in consultation from Newlin, Enobong, MD for evaluation of ***.   Past Medical History:  Past Medical History:  Diagnosis Date   Adjustment disorder with anxious mood    Allergy    Anxiety    Arthritis    hands (09/28/2015) and knees   Depression    Diabetes mellitus without complication (HCC)    type 2, no meds, does not check blood sugar   GERD (gastroesophageal reflux disease)    Hyperlipidemia    Hypertension    Sleep apnea    age 47, uses nightly on autopap 5-15   Tobacco use disorder     Past Surgical History:  Past Surgical History:  Procedure Laterality Date   APPENDECTOMY  09/15/2015   CARPAL TUNNEL RELEASE Bilateral    COLONOSCOPY  02/26/2019   INCISIONAL HERNIA REPAIR N/A 09/29/2015   Procedure: HERNIA REPAIR INCISIONAL;  Surgeon: Debby Shipper, MD;  Location: [facility] OR;  Service: General;  Laterality: N/A;   LAPAROSCOPIC APPENDECTOMY N/A 09/15/2015   Procedure: APPENDECTOMY LAPAROSCOPIC;  Surgeon: Lynwood Pina, MD;  Location: [facility] OR;  Service: General;  Laterality: N/A;   LIPOMA EXCISION Right 03/01/2023   Procedure: RIGHT SHOULDER LIPOMA REMOVAL;  Surgeon: Addie Cordella Hamilton, MD;  Location: [facility] OR;  Service: Orthopedics;  Laterality: Right;   LUMPS ON BACK  2013   Lipomas removed   UPPER GI ENDOSCOPY  02/26/2019   WISDOM TOOTH EXTRACTION      Allergies:  Allergies  Allergen Reactions   Penicillins Anaphylaxis    Has patient had a PCN reaction causing immediate rash, facial/tongue/throat swelling, SOB or lightheadedness with hypotensionNO Has patient had a PCN reaction causing severe rash involving mucus membranes or skin necrosis: NO Has patient had a PCN reaction that required hospitalization NO Has patient had a PCN reaction occurring within the last 10 years: NO If all of the above answers are NO, then may proceed with  Cephalosporin use.    Family History:  Family History  Problem Relation Age of Onset   Stroke Mother    Diabetes type II Mother    Diabetes Mother    Colon polyps Neg Hx    Esophageal cancer Neg Hx    Rectal cancer Neg Hx    Stomach cancer Neg Hx     Social History:  Social History   Tobacco Use   Smoking status: Every Day    Current packs/day: 0.50    Average packs/day: 0.5 packs/day for 36.8 years (18.4 ttl pk-yrs)    Types: Cigarettes    Start date: 04/21/1987   Smokeless tobacco: Never   Tobacco comments:    Patches RX but patient does not use patches as pf 02/27/23.  Vaping Use   Vaping status: Never Used  Substance Use Topics   Alcohol use: Yes    Alcohol/week: 6.0 - 7.0 standard drinks of alcohol    Types: 6 - 7 Shots of liquor per week    Comment: takes shots whenever has pain, 1/night   Drug use: Yes    Types: Marijuana    Comment: Last use was on 02/26/23, informed pt to withhold 24-48 hrs prior to procedure    Review of symptoms:  Constitutional:  Negative for unexplained weight loss, night sweats, fever, chills ENT:  Negative for nose bleeds, sinus pain, painful swallowing CV:  Negative for chest  pain, shortness of breath, exercise intolerance, palpitations, loss of consciousness Resp:  Negative for cough, wheezing, shortness of breath GI:  Negative for nausea, vomiting, diarrhea, bloody stools GU:  Positives noted in HPI; otherwise negative for gross hematuria, dysuria, urinary incontinence Neuro:  Negative for seizures, poor balance, limb weakness, slurred speech Psych:  Negative for lack of energy, depression, anxiety Endocrine:  Negative for polydipsia, polyuria, symptoms of hypoglycemia (dizziness, hunger, sweating) Hematologic:  Negative for anemia, purpura, petechia, prolonged or excessive bleeding, use of anticoagulants  Allergic:  Negative for difficulty breathing or choking as a result of exposure to anything; no shellfish allergy; no allergic  response (rash/itch) to materials, foods  Physical exam: There were no vitals taken for this visit. GENERAL APPEARANCE:  Well appearing, well developed, well nourished, NAD HEENT: Atraumatic, Normocephalic. NECK: Normal appearance LUNGS: Normal inspiratory and expiratory excursion HEART: Regular Rate ABDOMEN: ***. GU: Phallus normal, no lesions. Scrotal skin normal. Testicles/epididymal structures normal. Meatus normal. Normal anal sphincter tone, prostate ***mL, symmetric, non nodular, non tender. EXTREMITIES: Moves all extremities well.  Without clubbing, cyanosis, or edema. NEUROLOGIC:  Alert and oriented x 3, normal gait, CN II-XII grossly intact.  MENTAL STATUS:  Appropriate. SKIN:  Warm, dry and intact.    Results: No results found for this or any previous visit (from the past 24 hours).  I have reviewed referring/prior physicians notes  I have reviewed urinalysis  I have reviewed PSA results--  I have reviewed prior imaging--CT images from October 2025 reviewed.  Estimated prostate volume 55 mL.  There is an enlarged median lobe.  Renal appearance is normal  I have reviewed urine culture results  Assessment: ***   Plan: ***

## 2024-02-03 ENCOUNTER — Ambulatory Visit: Payer: Self-pay | Admitting: Gastroenterology

## 2024-02-03 ENCOUNTER — Encounter: Payer: Self-pay | Admitting: Gastroenterology

## 2024-02-03 VITALS — BP 120/80 | HR 104 | Ht 67.0 in | Wt 197.5 lb

## 2024-02-03 DIAGNOSIS — K432 Incisional hernia without obstruction or gangrene: Secondary | ICD-10-CM

## 2024-02-03 DIAGNOSIS — E1143 Type 2 diabetes mellitus with diabetic autonomic (poly)neuropathy: Secondary | ICD-10-CM | POA: Diagnosis not present

## 2024-02-03 DIAGNOSIS — K5909 Other constipation: Secondary | ICD-10-CM | POA: Diagnosis not present

## 2024-02-03 DIAGNOSIS — K3184 Gastroparesis: Secondary | ICD-10-CM | POA: Diagnosis not present

## 2024-02-03 DIAGNOSIS — K219 Gastro-esophageal reflux disease without esophagitis: Secondary | ICD-10-CM

## 2024-02-03 DIAGNOSIS — K2100 Gastro-esophageal reflux disease with esophagitis, without bleeding: Secondary | ICD-10-CM | POA: Diagnosis not present

## 2024-02-03 DIAGNOSIS — K760 Fatty (change of) liver, not elsewhere classified: Secondary | ICD-10-CM

## 2024-02-03 DIAGNOSIS — K5900 Constipation, unspecified: Secondary | ICD-10-CM | POA: Insufficient documentation

## 2024-02-03 MED ORDER — OMEPRAZOLE 40 MG PO CPDR: 40.0000 mg | DELAYED_RELEASE_CAPSULE | Freq: Two times a day (BID) | ORAL | 3 refills | Status: AC

## 2024-02-03 MED ORDER — PRUCALOPRIDE SUCCINATE 2 MG PO TABS: 2.0000 mg | ORAL_TABLET | Freq: Every day | ORAL | 3 refills | Status: AC

## 2024-02-03 NOTE — Progress Notes (Signed)
 "    02/03/2024 Timothy Mccarthy [id number] Jul 29, 1970   Discussed the use of AI scribe software for clinical note transcription with the patient, who gave verbal consent to proceed.  History of Present Illness Timothy Mccarthy is a 53 year old male with diabetic gastroparesis, GERD with esophagitis, and hepatic steatosis who presents for follow-up of persistent gastrointestinal symptoms.  He is a patient of Dr. Trenna.  Persistent symptoms include nausea, low energy, and a sensation of being drained. Vomiting occurs at least once or twice weekly, typically 45 minutes to an hour after meals, with three to four episodes last night after eating. Nocturnal regurgitation and choking sensations due to acid reflux have woken him several times. Reports early satiety and fullness in the upper abdomen.  Constipation persists, with bowel movements described as all right but less frequent than desired.  He had been taking his omeprazole  twice daily for acid reflux, but has run out of medication.  History of type 2 diabetes with recent hemoglobin A1c of 6.1 and prior poor glycemic control. Neuropathy symptoms present in his feet. He is currently out of work and recently obtained Medicaid coverage for medications.  EKG from 02/2023 with prolonged QTC.  CT scan abdomen and pelvis with contrast 11/2023: IMPRESSION: 1. Prior ventral hernia repair with small recurrent fat containing hernias along the repair site. 2. Wall thickening versus underdistention of the gastric antrum with submucosal fibrofatty infiltration, which can be seen in the setting of chronic gastritis. 3. Diffuse hepatic steatosis with enlargement of the lateral left lobe of the liver and widening of the hepatic fissures, which can be seen in the setting of cirrhosis. 4. Enlarged prostate gland with median lobe hypertrophy.   Ultrasound with elastography showed a normal appearance of the liver with a median K PA of 5.4.  EGD  12/2023: - LA Grade C reflux esophagitis with no bleeding. - A large amount of food ( residue) in the stomach. - Normal examined duodenum. - No specimens collected.  Gastric emptying scan 12/2023 showed significant delay in gastric emptying.   Past Medical History:  Diagnosis Date   Adjustment disorder with anxious mood    Allergy    Anxiety    Arthritis    hands (09/28/2015) and knees   Depression    Diabetes mellitus without complication (HCC)    type 2, no meds, does not check blood sugar   GERD (gastroesophageal reflux disease)    Hyperlipidemia    Hypertension    Sleep apnea    age 46, uses nightly on autopap 5-15   Tobacco use disorder    Past Surgical History:  Procedure Laterality Date   APPENDECTOMY  09/15/2015   CARPAL TUNNEL RELEASE Bilateral    COLONOSCOPY  02/26/2019   INCISIONAL HERNIA REPAIR N/A 09/29/2015   Procedure: HERNIA REPAIR INCISIONAL;  Surgeon: Debby Shipper, MD;  Location: [facility] OR;  Service: General;  Laterality: N/A;   LAPAROSCOPIC APPENDECTOMY N/A 09/15/2015   Procedure: APPENDECTOMY LAPAROSCOPIC;  Surgeon: Lynwood Pina, MD;  Location: [facility] OR;  Service: General;  Laterality: N/A;   LIPOMA EXCISION Right 03/01/2023   Procedure: RIGHT SHOULDER LIPOMA REMOVAL;  Surgeon: Addie Cordella Hamilton, MD;  Location: [facility] OR;  Service: Orthopedics;  Laterality: Right;   LUMPS ON BACK  2013   Lipomas removed   UPPER GI ENDOSCOPY  02/26/2019   WISDOM TOOTH EXTRACTION      reports that he has been smoking cigarettes. He started smoking about 36 years ago. He has  a 18.4 pack-year smoking history. He has never used smokeless tobacco. He reports current alcohol use of about 6.0 - 7.0 standard drinks of alcohol per week. He reports current drug use. Drug: Marijuana. family history includes Diabetes in his mother; Diabetes type II in his mother; Stroke in his mother. Allergies[1]    Outpatient Encounter Medications as of 02/03/2024  Medication Sig   amLODipine  (NORVASC )  10 MG tablet Take 1 tablet (10 mg total) by mouth daily.   bismuth subsalicylate (PEPTO BISMOL) 262 MG chewable tablet Chew 262-524 mg by mouth as needed (stomach upset).   Blood Glucose Monitoring Suppl (ONE TOUCH ULTRA 2) w/Device KIT Inject 1 each into the skin daily.   busPIRone  (BUSPAR ) 15 MG tablet Take 1 tablet (15 mg total) by mouth 2 (two) times daily.   cetirizine  (ZYRTEC ) 10 MG tablet TAKE 1 TABLET(10 MG) BY MOUTH DAILY   diphenoxylate -atropine  (LOMOTIL ) 2.5-0.025 MG tablet Take 1 tablet by mouth 4 (four) times daily as needed for diarrhea or loose stools.   FIBER GUMMIES PO Take 1 tablet by mouth in the morning.   glucose blood (ONETOUCH ULTRA BLUE TEST) test strip Use as instructed daily   Lancets (ONETOUCH ULTRASOFT) lancets Use as instructed daily   Misc. Devices MISC CPAP machine on autopap 5 to 15   Multiple Vitamin (MULTIVITAMIN WITH MINERALS) TABS tablet Take 1 tablet by mouth in the morning. One A Day Multivitamin   Omega-3 Fatty Acids (FISH OIL OMEGA-3 PO) Take 1 capsule by mouth daily.   omeprazole  (PRILOSEC) 40 MG capsule Take 1 capsule (40 mg total) by mouth 2 (two) times daily before a meal.   ondansetron  (ZOFRAN ) 4 MG tablet Take 1 tablet (4 mg total) by mouth every 8 (eight) hours as needed for nausea or vomiting.   promethazine -dextromethorphan (PROMETHAZINE -DM) 6.25-15 MG/5ML syrup Take 5 mLs by mouth 4 (four) times daily as needed for cough.   atorvastatin  (LIPITOR) 40 MG tablet TAKE 1 TABLET(40 MG) BY MOUTH DAILY (Patient not taking: Reported on 02/03/2024)   DULoxetine  (CYMBALTA ) 60 MG capsule Take 1 capsule (60 mg total) by mouth daily. (Patient not taking: Reported on 02/03/2024)   famotidine  (PEPCID ) 20 MG tablet Take 1 tablet (20 mg total) by mouth at bedtime. (Patient not taking: Reported on 02/03/2024)   fluticasone  (FLONASE ) 50 MCG/ACT nasal spray Place 1 spray into both nostrils daily. (Patient not taking: Reported on 02/03/2024)   gabapentin  (NEURONTIN ) 300  MG capsule Take 1 capsule (300 mg total) by mouth 2 (two) times daily. (Patient not taking: Reported on 02/03/2024)   meloxicam  (MOBIC ) 15 MG tablet TAKE 1 TABLET(15 MG) BY MOUTH DAILY (Patient not taking: Reported on 02/03/2024)   methocarbamol  (ROBAXIN ) 500 MG tablet Take 1 tablet (500 mg total) by mouth every 8 (eight) hours as needed for muscle spasms. (Patient not taking: Reported on 02/03/2024)   nicotine  (NICODERM CQ ) 14 mg/24hr patch Place 1 patch (14 mg total) onto the skin daily. Then decrease to 7 mg / 24 hours (Patient not taking: Reported on 02/03/2024)   predniSONE  (DELTASONE ) 20 MG tablet Take 1 tablet (20 mg total) by mouth daily with breakfast. (Patient not taking: Reported on 02/03/2024)   zinc sulfate 220 (50 Zn) MG capsule Take 220 mg by mouth in the morning. (Patient not taking: Reported on 02/03/2024)   [DISCONTINUED] sodium chloride  (OCEAN) 0.65 % SOLN nasal spray Place 1 spray into both nostrils as needed for congestion.   No facility-administered encounter medications on file as of 02/03/2024.  REVIEW OF SYSTEMS  : All other systems reviewed and negative except where noted in the History of Present Illness.   PHYSICAL EXAM: BP 120/80   Pulse (!) 104   Ht 5' 7 (1.702 m)   Wt 197 lb 8 oz (89.6 kg)   BMI 30.93 kg/m  General: Well developed AA male in no acute distress Head: Normocephalic and atraumatic Eyes:  Sclerae anicteric, conjunctiva pink. Ears: Normal auditory acuity Lungs: Clear throughout to auscultation; no W/R/R. Heart: Slightly tachy with regular rhythm; no M/R/G. Abdomen: Soft, non-distended.  BS present.  Mild diffuse TTP. Musculoskeletal: Symmetrical with no gross deformities  Skin: No lesions on visible extremities Extremities: No edema  Neurological: Alert oriented x 4, grossly non-focal Psychological:  Alert and cooperative. Normal mood and affect Assessment & Plan Diabetic gastroparesis Chronic symptomatic delayed gastric emptying  secondary to longstanding diabetes with irreversible neuropathic changes. Symptoms persist and impact quality of life despite improved glycemic control. - Recommended small, frequent meals and avoidance of late-night eating, with at least three hours between last meal and lying down. - Prescribed prucalopride (Motegrity ) 1 tablet daily in the morning to improve gastric and colonic motility. - Instructed to initiate prucalopride on a day at home to monitor for loose stools and gastrointestinal tolerance. - Advised to notify via MyChart if prucalopride is unaffordable, unavailable, or causes persistent loose stools beyond 10-14 days. - Discussed metoclopramide (Reglan) as a fallback option if prucalopride is not tolerated or effective, but deferred due to prolonged QT interval and risk of extrapyramidal side effects. - Scheduled follow-up in 8-10 weeks to reassess symptoms and response to therapy.  Gastroesophageal reflux disease with esophagitis Chronic GERD with esophageal irritation confirmed on endoscopy, exacerbated by delayed gastric emptying and resulting in nocturnal regurgitation and choking. - Renewed omeprazole  for twice daily dosing (morning and evening). - Instructed to confirm refills with pharmacy and maintain adherence to twice daily regimen. - Reinforced dietary modifications including small, frequent meals and avoidance of late-night eating. - Advised to maintain upright posture after meals and avoid lying down within three hours of eating.  Recurrent incisional hernia Small, fat-containing recurrent hernia at prior ventral hernia repair site. Pain likely related to delayed gastric emptying rather than hernia. No immediate intervention required per surgical evaluation. - Advised to monitor for development of bulge or worsening symptoms. - Encouraged core muscle strengthening exercises to prevent hernia progression. - Deferred surgical intervention unless symptoms worsen.  Hepatic  steatosis Fatty liver identified on CT with normal liver appearance on subsequent ultrasound with elastography. No evidence of advanced liver disease.  Fib 4 score 1.03. - Recommended lifestyle modifications including healthy diet, regular exercise, weight loss, and optimal glycemic control to prevent progression.  Constipation Chronic constipation with infrequent bowel movements, likely contributing to abdominal fullness and exacerbating gastroparesis symptoms. - Prescribed prucalopride (Motegrity ) 1 tablet daily in the morning to improve colonic motility. - Advised to monitor for loose stools and gastrointestinal tolerance, especially during initial 7-10 days of therapy. - Instructed to notify office if persistent diarrhea or intolerance occurs.     CC:  Newlin, Enobong, MD        [1]  Allergies Allergen Reactions   Penicillins Anaphylaxis    Has patient had a PCN reaction causing immediate rash, facial/tongue/throat swelling, SOB or lightheadedness with hypotensionNO Has patient had a PCN reaction causing severe rash involving mucus membranes or skin necrosis: NO Has patient had a PCN reaction that required hospitalization NO Has patient had a PCN reaction  occurring within the last 10 years: NO If all of the above answers are NO, then may proceed with Cephalosporin use.   "

## 2024-02-03 NOTE — Patient Instructions (Addendum)
 We have sent the following medications to your pharmacy for you to pick up at your convenience: Omeprazole  40 mg twice daily.  Motegrity  2 mg daily.  _______________________________________________________  If your blood pressure at your visit was 140/90 or greater, please contact your primary care physician to follow up on this.  _______________________________________________________  If you are age 52 or older, your body mass index should be between 23-30. Your Body mass index is 30.93 kg/m. If this is out of the aforementioned range listed, please consider follow up with your Primary Care Provider.  If you are age 71 or younger, your body mass index should be between 19-25. Your Body mass index is 30.93 kg/m. If this is out of the aformentioned range listed, please consider follow up with your Primary Care Provider.   ________________________________________________________  The [facility] providers would like to encourage you to use MYCHART to communicate with providers for non-urgent requests or questions.  Due to long hold times on the telephone, sending your provider a message by [facility] may be a faster and more efficient way to get a response.  Please allow 48 business hours for a response.  Please remember that this is for non-urgent requests.  _______________________________________________________  [facility] is using a team-based approach to care.  Your team is made up of your doctor and two to three APPS. Our APPS (Nurse Practitioners and Physician Assistants) work with your physician to ensure care continuity for you. They are fully qualified to address your health concerns and develop a treatment plan. They communicate directly with your gastroenterologist to care for you. Seeing the Advanced Practice Practitioners on your physician's team can help you by facilitating care more promptly, often allowing for earlier appointments, access to diagnostic testing,  procedures, and other specialty referrals.

## 2024-02-04 ENCOUNTER — Other Ambulatory Visit (HOSPITAL_COMMUNITY): Payer: Self-pay

## 2024-02-04 ENCOUNTER — Telehealth: Payer: Self-pay

## 2024-02-04 MED ORDER — LINACLOTIDE 72 MCG PO CAPS: 72.0000 ug | ORAL_CAPSULE | Freq: Every day | ORAL | 6 refills | Status: AC

## 2024-02-04 NOTE — Telephone Encounter (Signed)
 The pt has been advised and agrees to try the Linzess . Prescription has been sent. He will call if there are any issues at the pharmacy

## 2024-02-04 NOTE — Telephone Encounter (Signed)
Timothy Mccarthy- please advise 

## 2024-02-04 NOTE — Telephone Encounter (Signed)
 Pharmacy Patient Advocate Encounter   Received notification from CoverMyMeds that prior authorization for Prucalopride Succinate  2MG  tablets is required/requested.   Insurance verification completed.   The patient is insured through [facility] MEDICAID.   Prior Authorization form/request asks a question that requires your assistance. Please see the question below and advise accordingly. The PA will not be submitted until the necessary information is received.   Has patient tried two preferred medications?  Amitiza, Linzess , Trulance

## 2024-02-24 NOTE — Progress Notes (Unsigned)
 "   Chief Complaint: No chief complaint on file.   History of Present Illness:  Timothy Mccarthy is a 54 y.o. male who is seen in consultation from Newlin, Enobong, MD for evaluation of ***.   Past Medical History:  Past Medical History:  Diagnosis Date   Adjustment disorder with anxious mood    Allergy    Anxiety    Arthritis    hands (09/28/2015) and knees   Depression    Diabetes mellitus without complication (HCC)    type 2, no meds, does not check blood sugar   GERD (gastroesophageal reflux disease)    Hyperlipidemia    Hypertension    Sleep apnea    age 59, uses nightly on autopap 5-15   Tobacco use disorder     Past Surgical History:  Past Surgical History:  Procedure Laterality Date   APPENDECTOMY  09/15/2015   CARPAL TUNNEL RELEASE Bilateral    COLONOSCOPY  02/26/2019   INCISIONAL HERNIA REPAIR N/A 09/29/2015   Procedure: HERNIA REPAIR INCISIONAL;  Surgeon: Debby Shipper, MD;  Location: [facility] OR;  Service: General;  Laterality: N/A;   LAPAROSCOPIC APPENDECTOMY N/A 09/15/2015   Procedure: APPENDECTOMY LAPAROSCOPIC;  Surgeon: Lynwood Pina, MD;  Location: [facility] OR;  Service: General;  Laterality: N/A;   LIPOMA EXCISION Right 03/01/2023   Procedure: RIGHT SHOULDER LIPOMA REMOVAL;  Surgeon: Addie Cordella Hamilton, MD;  Location: [facility] OR;  Service: Orthopedics;  Laterality: Right;   LUMPS ON BACK  2013   Lipomas removed   UPPER GI ENDOSCOPY  02/26/2019   WISDOM TOOTH EXTRACTION      Allergies:  Allergies[1]  Family History:  Family History  Problem Relation Age of Onset   Stroke Mother    Diabetes type II Mother    Diabetes Mother    Colon polyps Neg Hx    Esophageal cancer Neg Hx    Rectal cancer Neg Hx    Stomach cancer Neg Hx     Social History:  Social History[2]  Review of symptoms:  Constitutional:  Negative for unexplained weight loss, night sweats, fever, chills ENT:  Negative for nose bleeds, sinus pain, painful swallowing CV:  Negative for chest  pain, shortness of breath, exercise intolerance, palpitations, loss of consciousness Resp:  Negative for cough, wheezing, shortness of breath GI:  Negative for nausea, vomiting, diarrhea, bloody stools GU:  Positives noted in HPI; otherwise negative for gross hematuria, dysuria, urinary incontinence Neuro:  Negative for seizures, poor balance, limb weakness, slurred speech Psych:  Negative for lack of energy, depression, anxiety Endocrine:  Negative for polydipsia, polyuria, symptoms of hypoglycemia (dizziness, hunger, sweating) Hematologic:  Negative for anemia, purpura, petechia, prolonged or excessive bleeding, use of anticoagulants  Allergic:  Negative for difficulty breathing or choking as a result of exposure to anything; no shellfish allergy; no allergic response (rash/itch) to materials, foods  Physical exam: There were no vitals taken for this visit. GENERAL APPEARANCE:  Well appearing, well developed, well nourished, NAD HEENT: Atraumatic, Normocephalic. NECK: Normal appearance LUNGS: Normal inspiratory and expiratory excursion HEART: Regular Rate ABDOMEN: ***. GU: Phallus normal, no lesions. Scrotal skin normal. Testicles/epididymal structures normal. Meatus normal. Normal anal sphincter tone, prostate ***mL, symmetric, non nodular, non tender. EXTREMITIES: Moves all extremities well.  Without clubbing, cyanosis, or edema. NEUROLOGIC:  Alert and oriented x 3, normal gait, CN II-XII grossly intact.  MENTAL STATUS:  Appropriate. SKIN:  Warm, dry and intact.    Results: No results found for this or any previous visit (  from the past 24 hours).  I have reviewed referring/prior physicians notes  I have reviewed urinalysis  I have reviewed PSA results--0.9 in April 2025  I have reviewed prior imaging--CT A/P from 10.2025--estimated prostate volume 35 mL  I have reviewed urine culture results  Assessment: ***   Plan: ***     [1]  Allergies Allergen Reactions    Penicillins Anaphylaxis    Has patient had a PCN reaction causing immediate rash, facial/tongue/throat swelling, SOB or lightheadedness with hypotensionNO Has patient had a PCN reaction causing severe rash involving mucus membranes or skin necrosis: NO Has patient had a PCN reaction that required hospitalization NO Has patient had a PCN reaction occurring within the last 10 years: NO If all of the above answers are NO, then may proceed with Cephalosporin use.  [2]  Social History Tobacco Use   Smoking status: Every Day    Current packs/day: 0.50    Average packs/day: 0.5 packs/day for 36.8 years (18.4 ttl pk-yrs)    Types: Cigarettes    Start date: 04/21/1987   Smokeless tobacco: Never   Tobacco comments:    Patches RX but patient does not use patches as pf 02/27/23.  Vaping Use   Vaping status: Never Used  Substance Use Topics   Alcohol use: Yes    Alcohol/week: 6.0 - 7.0 standard drinks of alcohol    Types: 6 - 7 Shots of liquor per week    Comment: takes shots whenever has pain, 1/night   Drug use: Yes    Types: Marijuana    Comment: Last use was on 02/26/23, informed pt to withhold 24-48 hrs prior to procedure   "

## 2024-02-26 ENCOUNTER — Ambulatory Visit: Payer: Self-pay | Admitting: Urology

## 2024-02-26 DIAGNOSIS — N138 Other obstructive and reflux uropathy: Secondary | ICD-10-CM

## 2024-03-31 ENCOUNTER — Ambulatory Visit: Admitting: Gastroenterology

## 2024-05-19 ENCOUNTER — Ambulatory Visit: Payer: Self-pay | Admitting: Family Medicine
